# Patient Record
Sex: Male | Born: 1970 | Race: Black or African American | Hispanic: No | Smoking: Current some day smoker
Health system: Southern US, Community
[De-identification: ages and names within clinical notes are randomized; demographics above are authoritative.]

## PROBLEM LIST (undated history)

## (undated) ENCOUNTER — Ambulatory Visit (HOSPITAL_COMMUNITY): Source: Home / Self Care

## (undated) DIAGNOSIS — I509 Heart failure, unspecified: Secondary | ICD-10-CM

## (undated) DIAGNOSIS — N200 Calculus of kidney: Secondary | ICD-10-CM

## (undated) DIAGNOSIS — F32A Depression, unspecified: Secondary | ICD-10-CM

## (undated) DIAGNOSIS — I10 Essential (primary) hypertension: Secondary | ICD-10-CM

## (undated) DIAGNOSIS — R569 Unspecified convulsions: Secondary | ICD-10-CM

## (undated) DIAGNOSIS — R0609 Other forms of dyspnea: Secondary | ICD-10-CM

## (undated) DIAGNOSIS — T7840XA Allergy, unspecified, initial encounter: Secondary | ICD-10-CM

## (undated) DIAGNOSIS — E119 Type 2 diabetes mellitus without complications: Secondary | ICD-10-CM

## (undated) DIAGNOSIS — J449 Chronic obstructive pulmonary disease, unspecified: Secondary | ICD-10-CM

## (undated) DIAGNOSIS — F1910 Other psychoactive substance abuse, uncomplicated: Secondary | ICD-10-CM

## (undated) DIAGNOSIS — I38 Endocarditis, valve unspecified: Secondary | ICD-10-CM

## (undated) DIAGNOSIS — F419 Anxiety disorder, unspecified: Secondary | ICD-10-CM

## (undated) DIAGNOSIS — R0600 Dyspnea, unspecified: Secondary | ICD-10-CM

## (undated) DIAGNOSIS — H409 Unspecified glaucoma: Secondary | ICD-10-CM

## (undated) HISTORY — DX: Anxiety disorder, unspecified: F41.9

## (undated) HISTORY — DX: Allergy, unspecified, initial encounter: T78.40XA

## (undated) HISTORY — DX: Depression, unspecified: F32.A

## (undated) HISTORY — PX: NO PAST SURGERIES: SHX2092

## (undated) HISTORY — DX: Heart failure, unspecified: I50.9

## (undated) HISTORY — DX: Essential (primary) hypertension: I10

## (undated) HISTORY — PX: FRACTURE SURGERY: SHX138

## (undated) HISTORY — DX: Type 2 diabetes mellitus without complications: E11.9

## (undated) HISTORY — PX: KIDNEY STONE SURGERY: SHX686

---

## 1998-06-16 ENCOUNTER — Encounter: Payer: Self-pay | Admitting: Emergency Medicine

## 1998-06-16 ENCOUNTER — Emergency Department (HOSPITAL_COMMUNITY): Admission: EM | Admit: 1998-06-16 | Discharge: 1998-06-16 | Payer: Self-pay | Admitting: Emergency Medicine

## 2005-10-27 ENCOUNTER — Emergency Department (HOSPITAL_COMMUNITY): Admission: EM | Admit: 2005-10-27 | Discharge: 2005-10-27 | Payer: Self-pay | Admitting: Emergency Medicine

## 2007-02-26 ENCOUNTER — Emergency Department (HOSPITAL_COMMUNITY): Admission: EM | Admit: 2007-02-26 | Discharge: 2007-02-26 | Payer: Self-pay | Admitting: Emergency Medicine

## 2011-02-05 LAB — URINALYSIS, ROUTINE W REFLEX MICROSCOPIC
Glucose, UA: NEGATIVE
Ketones, ur: 15 — AB
Nitrite: NEGATIVE
Protein, ur: 30 — AB
Specific Gravity, Urine: 1.033 — ABNORMAL HIGH
Urobilinogen, UA: 1
pH: 5.5

## 2011-02-05 LAB — URINE MICROSCOPIC-ADD ON

## 2014-10-09 ENCOUNTER — Ambulatory Visit: Payer: Self-pay

## 2014-11-06 ENCOUNTER — Ambulatory Visit: Payer: Self-pay | Attending: Internal Medicine

## 2015-06-17 ENCOUNTER — Emergency Department (HOSPITAL_COMMUNITY): Payer: Self-pay

## 2015-06-17 ENCOUNTER — Encounter (HOSPITAL_COMMUNITY): Payer: Self-pay | Admitting: Emergency Medicine

## 2015-06-17 ENCOUNTER — Emergency Department (HOSPITAL_COMMUNITY)
Admission: EM | Admit: 2015-06-17 | Discharge: 2015-06-17 | Disposition: A | Discharge status: Home / Self Care | Payer: Self-pay | Attending: Emergency Medicine | Admitting: Emergency Medicine

## 2015-06-17 DIAGNOSIS — R05 Cough: Secondary | ICD-10-CM

## 2015-06-17 DIAGNOSIS — Z880 Allergy status to penicillin: Secondary | ICD-10-CM | POA: Insufficient documentation

## 2015-06-17 DIAGNOSIS — F17200 Nicotine dependence, unspecified, uncomplicated: Secondary | ICD-10-CM | POA: Insufficient documentation

## 2015-06-17 DIAGNOSIS — B349 Viral infection, unspecified: Secondary | ICD-10-CM | POA: Insufficient documentation

## 2015-06-17 DIAGNOSIS — R059 Cough, unspecified: Secondary | ICD-10-CM

## 2015-06-17 DIAGNOSIS — M549 Dorsalgia, unspecified: Secondary | ICD-10-CM | POA: Insufficient documentation

## 2015-06-17 LAB — URINALYSIS, ROUTINE W REFLEX MICROSCOPIC
BILIRUBIN URINE: NEGATIVE
GLUCOSE, UA: NEGATIVE mg/dL
HGB URINE DIPSTICK: NEGATIVE
Ketones, ur: NEGATIVE mg/dL
Leukocytes, UA: NEGATIVE
Nitrite: NEGATIVE
PH: 5.5 (ref 5.0–8.0)
Protein, ur: 30 mg/dL — AB
SPECIFIC GRAVITY, URINE: 1.028 (ref 1.005–1.030)

## 2015-06-17 LAB — URINE MICROSCOPIC-ADD ON: BACTERIA UA: NONE SEEN

## 2015-06-17 MED ORDER — BENZONATATE 100 MG PO CAPS: 100.0000 mg | ORAL_CAPSULE | Freq: Three times a day (TID) | ORAL | Status: DC

## 2015-06-17 NOTE — ED Notes (Signed)
Pt called again for triage. No answer.

## 2015-06-17 NOTE — ED Provider Notes (Signed)
CSN: [id number]     Arrival date & time 06/17/15  1122 History  By signing my name below, I, Arianna Nassar, attest that this documentation has been prepared under the direction and in the presence of [employer], PA-C. Electronically Signed: Octavia Heir, ED Scribe. 06/17/2015. 12:53 PM.    Chief Complaint  Patient presents with  . Cough  . Fever  . Back Pain    The history is provided by the patient. No language interpreter was used.   HPI Comments: Darius [name] is a 45 y.o. male who presents to the Emergency Department complaining of intermittent, gradual worsening flu-like symptoms onset 4 days ago. Pt has been having associated generalized body aches, headache, congestion, productive cough, weakness and back pain. He reports having symptoms similar to when he had pneumonia. Pt took two dayquil yesterday to help with his symptoms which gave him relief. He denies chest pain, sob, sore throat, sinus pressure, fever, abdominal pain, and diarrhea.   History reviewed. No pertinent past medical history. History reviewed. No pertinent past surgical history. No family history on file. Social History  Substance Use Topics  . Smoking status: Current Some Day Smoker  . Smokeless tobacco: None  . Alcohol Use: Yes    Review of Systems  Constitutional: Positive for chills. Negative for fever.  HENT: Positive for congestion. Negative for sinus pressure and sore throat.   Eyes: Negative for visual disturbance.  Respiratory: Positive for cough.   Cardiovascular: Negative.   Gastrointestinal: Negative for abdominal pain and diarrhea.  Genitourinary: Negative for dysuria.  Musculoskeletal: Positive for back pain.  Skin: Negative for rash.  Neurological: Positive for weakness and headaches.      Allergies  Penicillins  Home Medications   Prior to Admission medications   Medication Sig Start Date End Date Taking? Authorizing Provider  benzonatate (TESSALON) 100 MG capsule Take 1 capsule  (100 mg total) by mouth every 8 (eight) hours. 06/17/15   Chase Picket Ward, PA-C   Triage vitals: BP 116/75 mmHg  Pulse 81  Temp(Src) 98.9 F (37.2 C) (Oral)  Resp 18  Wt 177 lb (80.287 kg)  SpO2 100% Physical Exam  Constitutional: He is oriented to person, place, and time. He appears well-developed and well-nourished.  HENT:  Head: Normocephalic and atraumatic.  Mouth/Throat: Oropharynx is clear and moist. No oropharyngeal exudate.  Neck: Normal range of motion. Neck supple.  Cardiovascular: Normal rate, regular rhythm and normal heart sounds.   Pulmonary/Chest: Effort normal and breath sounds normal. No respiratory distress. He has no wheezes. He has no rales.  Abdominal: Soft. He exhibits no distension. There is no tenderness.  Musculoskeletal: Normal range of motion.  Lymphadenopathy:    He has no cervical adenopathy.  Neurological: He is alert and oriented to person, place, and time.  Skin: Skin is warm and dry. No rash noted.  Psychiatric: He has a normal mood and affect.  Nursing note and vitals reviewed.   ED Course  Procedures  DIAGNOSTIC STUDIES: Oxygen Saturation is 100% on RA, normal by my interpretation.  COORDINATION OF CARE:  12:50 PM Discussed treatment plan which includes chest x-ray and urinalysis with pt at bedside and pt agreed to plan.  Labs Review Labs Reviewed  URINALYSIS, ROUTINE W REFLEX MICROSCOPIC (NOT AT [facility]) - Abnormal; Notable for the following:    Color, Urine AMBER (*)    Protein, ur 30 (*)    All other components within normal limits  URINE MICROSCOPIC-ADD ON - Abnormal; Notable for the  following:    Squamous Epithelial / LPF 0-5 (*)    All other components within normal limits    Imaging Review Dg Chest 2 View  06/17/2015  CLINICAL DATA:  Cough EXAM: CHEST  2 VIEW COMPARISON:  10/27/2005 chest radiograph. FINDINGS: Stable cardiomediastinal silhouette with normal heart size. No pneumothorax. No pleural effusion. Lungs appear clear,  with no acute consolidative airspace disease and no pulmonary edema. IMPRESSION: No active cardiopulmonary disease. Electronically Signed   By: Delbert Phenix M.D.   On: 06/17/2015 13:34   I have personally reviewed and evaluated these images and lab results as part of my medical decision-making.   EKG Interpretation None      MDM   Final diagnoses:  Cough  Viral syndrome   Darius [name] is afebrile, non-toxic appearing with a clear lung exam. CXR with no acute cardiopulmonary disease. Mild rhinorrhea and OP with erythema, no exudates. Likely viral URI. Patient was also complaining of low back pain, therefore UA was obtained which is reassuring. Patient is agreeable to symptomatic treatment with close follow up with PCP as needed but spoke at length about emergent, changing, or worsening of symptoms that should prompt return to ER. Patient voices understanding and is agreeable to plan.   Blood pressure 105/68, pulse 76, temperature 99.1 F (37.3 C), temperature source Oral, resp. rate 18, weight 80.287 kg, SpO2 98 %.  I personally performed the services described in this documentation, which was scribed in my presence. The recorded information has been reviewed and is accurate.  [facility] Ward, PA-C 06/17/15 1605  Arby Barrette, MD 06/18/15 [phone]

## 2015-06-17 NOTE — Discharge Instructions (Signed)
1. Medications: mucinex as needed for congestion, tessalon as needed for cough, continue usual home medications 2. Treatment: rest, drink plenty of fluids, take tylenol or ibuprofen for fever control as needed 3. Follow Up: Please followup with your primary doctor in 3 days for discussion of your diagnoses and further evaluation after today's visit; if you do not have a primary care doctor use the resource guide provided to find one; Return to the ER for high fevers, difficulty breathing or other concerning symptoms   Emergency Department Resource Guide  1) Find a Doctor and Pay Out of Pocket Although you won't have to find out who is covered by your insurance plan, it is a good idea to ask around and get recommendations. You will then need to call the office and see if the doctor you have chosen will accept you as a new patient and what types of options they offer for patients who are self-pay. Some doctors offer discounts or will set up payment plans for their patients who do not have insurance, but you will need to ask so you aren't surprised when you get to your appointment.  2) Contact Your Local Health Department Not all health departments have doctors that can see patients for sick visits, but many do, so it is worth a call to see if yours does. If you don't know where your local health department is, you can check in your phone book. The CDC also has a tool to help you locate your state's health department, and many state websites also have listings of all of their local health departments.  3) Find a Walk-in Clinic If your illness is not likely to be very severe or complicated, you may want to try a walk in clinic. These are popping up all over the country in pharmacies, drugstores, and shopping centers. They're usually staffed by nurse practitioners or physician assistants that have been trained to treat common illnesses and complaints. They're usually fairly quick and inexpensive. However,  if you have serious medical issues or chronic medical problems, these are probably not your best option.  No Primary Care Doctor: - Call [facility] at  [phone]: they can help you locate a primary care doctor that  accepts your insurance, provides certain services, etc. - Physician Referral Service: [phone]  Chronic Pain Problems: Organization         Address  Phone   Notes  [facility]  [phone] Patients need to be referred by their primary care doctor.   Medication Assistance: Organization         Address  Phone   Notes  [facility] Medication [facility] [address] [place] [place], [place] [address] [phone] --Must be a resident of [facility] -- Must have NO insurance coverage whatsoever (no Medicaid/ Medicare, etc.) -- The pt. MUST have a primary care doctor that directs their care regularly and follows them in the community   [facility]  [phone]   [employer]  [phone]    Agencies that provide inexpensive medical care: Organization         Address  Phone   Notes  [facility]  [phone]   [facility]    [phone]   [facility] [address] [place], [place] [address] [phone]   [facility] [address] [place] [phone]   [facility]    [phone]  [facility]    [phone]   [facility]  [address], [place] Phone:  [phone], Fax:  [phone] Hours of Operation:  9 am - 6 pm, M-F.  Also accepts Medicaid/Medicare and self-pay.  [facility]  [address], [place] Phone: [phone], Fax: [phone]. Hours of Operation:  8:30 am - 5:30 pm, M-F.  Also accepts Medicaid and self-pay.  [facility] [place] [address], [place] Phone: [phone]   [facility] [address] Natasha Bence [place], [place] [phone], Ext. [phone] Mondays & Thursdays: 7-9 AM.  First 15 patients are seen on a first come, first serve basis.    Medicaid-accepting [facility] Providers:  Organization         Address  Phone   Notes  [facility] [address], [place] [phone] Also accepts self-pay patients.  [facility] [address] Laurell Josephs [place], [place]  [phone]   [facility] [address], [place] [phone]   [facility] [address] [place] [phone]   Renaye Rakers [address], [place]   [phone] Only accepts [place] patients after they have their name applied to their card.   Self-Pay (no insurance) in [facility]:  Organization         Address  Phone   Notes  Sickle Cell Patients, [facility] [address] [place], [place] [phone]   [facility] [address] [place], [place] [phone]   [facility] [place]  [address], [place] [phone]   [facility]/Dr. Osei-Bonsu  [address] [place] or [address], [place] [phone] Phone number for both [place] and [place] locations is the same.  [facility] [address], [place] [phone]   [facility] [address] [place] or [address] [phone]   [facility] [address] [place] [phone], phone; [phone], fax Sees patients 1st and 3rd Saturday of every month.  Must not qualify for public or private insurance (i.e. Medicaid, Medicare, [place] Health Choice, Veterans' Benefits)  Household income should be no more than 200% of the poverty level The clinic cannot treat you if you are pregnant or think you are  pregnant  Sexually transmitted diseases are not treated at the clinic.

## 2015-06-17 NOTE — ED Notes (Signed)
Called for triage x1.

## 2015-06-17 NOTE — ED Notes (Signed)
Called for triage x2 no response. 

## 2015-06-17 NOTE — ED Notes (Signed)
Pt started 4-5 days ago with fever, bodyaches, cough, nasal congestion, back pain.

## 2015-10-03 ENCOUNTER — Emergency Department (HOSPITAL_COMMUNITY)
Admission: EM | Admit: 2015-10-03 | Discharge: 2015-10-03 | Disposition: A | Discharge status: Home / Self Care | Payer: Self-pay | Attending: Emergency Medicine | Admitting: Emergency Medicine

## 2015-10-03 ENCOUNTER — Encounter (HOSPITAL_COMMUNITY): Payer: Self-pay | Admitting: Emergency Medicine

## 2015-10-03 ENCOUNTER — Emergency Department (HOSPITAL_COMMUNITY): Payer: Self-pay

## 2015-10-03 DIAGNOSIS — N189 Chronic kidney disease, unspecified: Secondary | ICD-10-CM | POA: Insufficient documentation

## 2015-10-03 DIAGNOSIS — N289 Disorder of kidney and ureter, unspecified: Secondary | ICD-10-CM

## 2015-10-03 DIAGNOSIS — R109 Unspecified abdominal pain: Secondary | ICD-10-CM

## 2015-10-03 DIAGNOSIS — R3129 Other microscopic hematuria: Secondary | ICD-10-CM

## 2015-10-03 DIAGNOSIS — N200 Calculus of kidney: Secondary | ICD-10-CM

## 2015-10-03 DIAGNOSIS — F17200 Nicotine dependence, unspecified, uncomplicated: Secondary | ICD-10-CM | POA: Insufficient documentation

## 2015-10-03 DIAGNOSIS — D72819 Decreased white blood cell count, unspecified: Secondary | ICD-10-CM

## 2015-10-03 LAB — COMPREHENSIVE METABOLIC PANEL
ALK PHOS: 55 U/L (ref 38–126)
ALT: 25 U/L (ref 17–63)
ANION GAP: 7 (ref 5–15)
AST: 31 U/L (ref 15–41)
Albumin: 3.5 g/dL (ref 3.5–5.0)
BILIRUBIN TOTAL: 0.7 mg/dL (ref 0.3–1.2)
BUN: 15 mg/dL (ref 6–20)
CALCIUM: 9.3 mg/dL (ref 8.9–10.3)
CO2: 25 mmol/L (ref 22–32)
CREATININE: 1.28 mg/dL — AB (ref 0.61–1.24)
Chloride: 108 mmol/L (ref 101–111)
Glucose, Bld: 102 mg/dL — ABNORMAL HIGH (ref 65–99)
Potassium: 3.5 mmol/L (ref 3.5–5.1)
Sodium: 140 mmol/L (ref 135–145)
TOTAL PROTEIN: 6 g/dL — AB (ref 6.5–8.1)

## 2015-10-03 LAB — URINALYSIS, ROUTINE W REFLEX MICROSCOPIC
BILIRUBIN URINE: NEGATIVE
Glucose, UA: NEGATIVE mg/dL
KETONES UR: 15 mg/dL — AB
Leukocytes, UA: NEGATIVE
NITRITE: NEGATIVE
PH: 6 (ref 5.0–8.0)
PROTEIN: 30 mg/dL — AB
Specific Gravity, Urine: 1.027 (ref 1.005–1.030)

## 2015-10-03 LAB — URINE MICROSCOPIC-ADD ON

## 2015-10-03 LAB — CBC
HEMATOCRIT: 39.2 % (ref 39.0–52.0)
HEMOGLOBIN: 13.1 g/dL (ref 13.0–17.0)
MCH: 30.7 pg (ref 26.0–34.0)
MCHC: 33.4 g/dL (ref 30.0–36.0)
MCV: 91.8 fL (ref 78.0–100.0)
Platelets: 165 10*3/uL (ref 150–400)
RBC: 4.27 MIL/uL (ref 4.22–5.81)
RDW: 12.4 % (ref 11.5–15.5)
WBC: 3.1 10*3/uL — ABNORMAL LOW (ref 4.0–10.5)

## 2015-10-03 MED ORDER — OXYCODONE-ACETAMINOPHEN 5-325 MG PO TABS: 1.0000 | ORAL_TABLET | ORAL | Status: DC | PRN

## 2015-10-03 NOTE — Discharge Instructions (Signed)
Take ibuprofen or naproxen as needed for less severe pain. Return to the emergency department if pain is not being controlled, or if you start running a fever.  Kidney Stones Kidney stones (urolithiasis) are deposits that form inside your kidneys. The intense pain is caused by the stone moving through the urinary tract. When the stone moves, the ureter goes into spasm around the stone. The stone is usually passed in the urine.  CAUSES   A disorder that makes certain neck glands produce too much parathyroid hormone (primary hyperparathyroidism).  A buildup of uric acid crystals, similar to gout in your joints.  Narrowing (stricture) of the ureter.  A kidney obstruction present at birth (congenital obstruction).  Previous surgery on the kidney or ureters.  Numerous kidney infections. SYMPTOMS   Feeling sick to your stomach (nauseous).  Throwing up (vomiting).  Blood in the urine (hematuria).  Pain that usually spreads (radiates) to the groin.  Frequency or urgency of urination. DIAGNOSIS   Taking a history and physical exam.  Blood or urine tests.  CT scan.  Occasionally, an examination of the inside of the urinary bladder (cystoscopy) is performed. TREATMENT   Observation.  Increasing your fluid intake.  Extracorporeal shock wave lithotripsy--This is a noninvasive procedure that uses shock waves to break up kidney stones.  Surgery may be needed if you have severe pain or persistent obstruction. There are various surgical procedures. Most of the procedures are performed with the use of small instruments. Only small incisions are needed to accommodate these instruments, so recovery time is minimized. The size, location, and chemical composition are all important variables that will determine the proper choice of action for you. Talk to your health care provider to better understand your situation so that you will minimize the risk of injury to yourself and your kidney.    HOME CARE INSTRUCTIONS   Drink enough water and fluids to keep your urine clear or pale yellow. This will help you to pass the stone or stone fragments.  Strain all urine through the provided strainer. Keep all particulate matter and stones for your health care provider to see. The stone causing the pain may be as small as a grain of salt. It is very important to use the strainer each and every time you pass your urine. The collection of your stone will allow your health care provider to analyze it and verify that a stone has actually passed. The stone analysis will often identify what you can do to reduce the incidence of recurrences.  Only take over-the-counter or prescription medicines for pain, discomfort, or fever as directed by your health care provider.  Keep all follow-up visits as told by your health care provider. This is important.  Get follow-up X-rays if required. The absence of pain does not always mean that the stone has passed. It may have only stopped moving. If the urine remains completely obstructed, it can cause loss of kidney function or even complete destruction of the kidney. It is your responsibility to make sure X-rays and follow-ups are completed. Ultrasounds of the kidney can show blockages and the status of the kidney. Ultrasounds are not associated with any radiation and can be performed easily in a matter of minutes.  Make changes to your daily diet as told by your health care provider. You may be told to:  Limit the amount of salt that you eat.  Eat 5 or more servings of fruits and vegetables each day.  Limit the amount of  meat, poultry, fish, and eggs that you eat.  Collect a 24-hour urine sample as told by your health care provider.You may need to collect another urine sample every 6-12 months. SEEK MEDICAL CARE IF:  You experience pain that is progressive and unresponsive to any pain medicine you have been prescribed. SEEK IMMEDIATE MEDICAL CARE IF:    Pain cannot be controlled with the prescribed medicine.  You have a fever or shaking chills.  The severity or intensity of pain increases over 18 hours and is not relieved by pain medicine.  You develop a new onset of abdominal pain.  You feel faint or pass out.  You are unable to urinate.   This information is not intended to replace advice given to you by your health care provider. Make sure you discuss any questions you have with your health care provider.   Document Released: 04/14/2005 Document Revised: 01/03/2015 Document Reviewed: 09/15/2012 Elsevier Interactive Patient Education 2016 Elsevier Inc.  Acetaminophen; Oxycodone tablets What is this medicine? ACETAMINOPHEN; OXYCODONE (a set a MEE noe fen; ox i KOE done) is a pain reliever. It is used to treat moderate to severe pain. This medicine may be used for other purposes; ask your health care provider or pharmacist if you have questions. What should I tell my health care provider before I take this medicine? They need to know if you have any of these conditions: -brain tumor -Crohn's disease, inflammatory bowel disease, or ulcerative colitis -drug abuse or addiction -head injury -heart or circulation problems -if you often drink alcohol -kidney disease or problems going to the bathroom -liver disease -lung disease, asthma, or breathing problems -an unusual or allergic reaction to acetaminophen, oxycodone, other opioid analgesics, other medicines, foods, dyes, or preservatives -pregnant or trying to get pregnant -breast-feeding How should I use this medicine? Take this medicine by mouth with a full glass of water. Follow the directions on the prescription label. You can take it with or without food. If it upsets your stomach, take it with food. Take your medicine at regular intervals. Do not take it more often than directed. Talk to your pediatrician regarding the use of this medicine in children. Special care may be  needed. Patients over 73 years old may have a stronger reaction and need a smaller dose. Overdosage: If you think you have taken too much of this medicine contact a poison control center or emergency room at once. NOTE: This medicine is only for you. Do not share this medicine with others. What if I miss a dose? If you miss a dose, take it as soon as you can. If it is almost time for your next dose, take only that dose. Do not take double or extra doses. What may interact with this medicine? -alcohol -antihistamines -barbiturates like amobarbital, butalbital, butabarbital, methohexital, pentobarbital, phenobarbital, thiopental, and secobarbital -benztropine -drugs for bladder problems like solifenacin, trospium, oxybutynin, tolterodine, hyoscyamine, and methscopolamine -drugs for breathing problems like ipratropium and tiotropium -drugs for certain stomach or intestine problems like propantheline, homatropine methylbromide, glycopyrrolate, atropine, belladonna, and dicyclomine -general anesthetics like etomidate, ketamine, nitrous oxide, propofol, desflurane, enflurane, halothane, isoflurane, and sevoflurane -medicines for depression, anxiety, or psychotic disturbances -medicines for sleep -muscle relaxants -naltrexone -narcotic medicines (opiates) for pain -phenothiazines like perphenazine, thioridazine, chlorpromazine, mesoridazine, fluphenazine, prochlorperazine, promazine, and trifluoperazine -scopolamine -tramadol -trihexyphenidyl This list may not describe all possible interactions. Give your health care provider a list of all the medicines, herbs, non-prescription drugs, or dietary supplements you use. Also tell them  if you smoke, drink alcohol, or use illegal drugs. Some items may interact with your medicine. What should I watch for while using this medicine? Tell your doctor or health care professional if your pain does not go away, if it gets worse, or if you have new or a  different type of pain. You may develop tolerance to the medicine. Tolerance means that you will need a higher dose of the medication for pain relief. Tolerance is normal and is expected if you take this medicine for a long time. Do not suddenly stop taking your medicine because you may develop a severe reaction. Your body becomes used to the medicine. This does NOT mean you are addicted. Addiction is a behavior related to getting and using a drug for a non-medical reason. If you have pain, you have a medical reason to take pain medicine. Your doctor will tell you how much medicine to take. If your doctor wants you to stop the medicine, the dose will be slowly lowered over time to avoid any side effects. You may get drowsy or dizzy. Do not drive, use machinery, or do anything that needs mental alertness until you know how this medicine affects you. Do not stand or sit up quickly, especially if you are an older patient. This reduces the risk of dizzy or fainting spells. Alcohol may interfere with the effect of this medicine. Avoid alcoholic drinks. There are different types of narcotic medicines (opiates) for pain. If you take more than one type at the same time, you may have more side effects. Give your health care provider a list of all medicines you use. Your doctor will tell you how much medicine to take. Do not take more medicine than directed. Call emergency for help if you have problems breathing. The medicine will cause constipation. Try to have a bowel movement at least every 2 to 3 days. If you do not have a bowel movement for 3 days, call your doctor or health care professional. Do not take Tylenol (acetaminophen) or medicines that have acetaminophen with this medicine. Too much acetaminophen can be very dangerous. Many nonprescription medicines contain acetaminophen. Always read the labels carefully to avoid taking more acetaminophen. What side effects may I notice from receiving this  medicine? Side effects that you should report to your doctor or health care professional as soon as possible: -allergic reactions like skin rash, itching or hives, swelling of the face, lips, or tongue -breathing difficulties, wheezing -confusion -light headedness or fainting spells -severe stomach pain -unusually weak or tired -yellowing of the skin or the whites of the eyes Side effects that usually do not require medical attention (report to your doctor or health care professional if they continue or are bothersome): -dizziness -drowsiness -nausea -vomiting This list may not describe all possible side effects. Call your doctor for medical advice about side effects. You may report side effects to FDA at 1-800-FDA-1088. Where should I keep my medicine? Keep out of the reach of children. This medicine can be abused. Keep your medicine in a safe place to protect it from theft. Do not share this medicine with anyone. Selling or giving away this medicine is dangerous and against the law. This medicine may cause accidental overdose and death if it taken by other adults, children, or pets. Mix any unused medicine with a substance like cat litter or coffee grounds. Then throw the medicine away in a sealed container like a sealed bag or a coffee can with a lid. Do  not use the medicine after the expiration date. Store at room temperature between 20 and 25 degrees C (68 and 77 degrees F). NOTE: This sheet is a summary. It may not cover all possible information. If you have questions about this medicine, talk to your doctor, pharmacist, or health care provider.    2016, Elsevier/Gold Standard. (2014-03-15 15:18:46)  [employer] is a great source of information about community services available.  Access by dialing 2-1-1 from anywhere in [place], or by website -  PooledIncome.pl.   Other Local Resources (Updated 04/2015)  Financial  Assistance   Services    Phone Number and Address  [facility]  Low-cost medical care - 1st and 3rd Saturday of every month  Must not qualify for public or private insurance and must have limited income [phone] [address] [place], [place]    [employer] of Social Services  Child care  Emergency assistance for housing and [employer]  Medicaid [phone] [address] [place], [place] [address]   [facility]  Low-cost medical care for children, communicable diseases, sexually-transmitted diseases, immunizations, maternity care, womens health and family planning [phone] [address] [place], [place] [address]  [facility]   Medication assistance for [facility] residents  Must meet income requirements [phone] [address] [place], [place].    [facility] Social Services  Child care  Emergency assistance for housing and [employer]  Medicaid [phone] [address] [place], [place] [address]  [facility]   Low-cost medical care,   Monday through Friday, 9 am to 6 pm.   Accepts Medicare/Medicaid, and self-pay [phone] [address] [place], [place] [address]  [facility]  Low-cost medical care - Monday through Friday, 8:30 am - 5:30 pm  Accepts Medicaid and self-pay [phone] [address] [place], [place] [address]   [facility]  Primary medical care, including for those with sickle cell disease  Accepts Medicare, Medicaid, insurance and self-pay [phone] [address] [place], [place]  [facility]   Primary medical care  Accepts Medicare, [place], insurance and self-pay [phone] [address] [place], [place] [address]   [facility]  Department of Social Services  Child care  Emergency assistance for housing and [employer]  Medicaid [phone] [address] [place], [place] [address]  [facility] Department of Health and [employer]  Child care  Emergency assistance for housing and [employer]  Medicaid [phone] [address] [place], [place] [address]   [facility] Medication Assistance Program  Medication assistance for [facility] residents with no insurance only  Must have a primary care doctor [phone] [address] [place], [place]  [facility]   Primary medical care  [place], [place], insurance  [phone] [address] [place], [place]  [facility]   Medication assistance [phone]  [facility]   Primary medical care  Accepts Medicare, [place], insurance and self-pay [phone] [address] [place], [place] [address]  [facility]   Primary medical care  Accepts Medicare, [place], insurance and self-pay [phone] [address] [place], [place] [address]  [facility]  For [place] residents between the ages of 58 and 79 who do not  have any form of health insurance, Medicare, Medicaid, or VA benefits.  Services are provided free of charge to uninsured patients who fall within federal poverty guidelines.    Hours: Tuesdays and Thursdays, 4:15 - 8 pm [phone] [address] [place], [place] [address]  [facility]     Primary medical care  Dental care  Nutritional counseling  Pharmacy  Accepts Medicaid, Medicare, most insurance.  Fees are adjusted based on ability to pay.   [phone] [facility] [address] [place], [place]  [phone] [facility] [address] [place], [place]  [phone] [facility] [place], [place]  [phone] [facility], [address] [place], [place]  [phone] [facility] [address] [place], [place]  [facility]  Womens health and family planning [phone] [place], [place]  [facility] Department of Social Services  Child care  Emergency assistance for housing and [employer]  Medicaid [phone] [address] [place], [place] [address]   [facility]    Ages 1 and older  Hours: Mondays and Thursdays, 7:00 am - 9:00 am Patients are seen on a first come, first served basis. [phone], [phone] [place], [place]  [facility] Division of Social Services  Child care  Emergency assistance for housing and [employer]  Medicaid [id number] [address] [place], [place] [address]  [facility]  Medication assistance  Rental assistance  Food pantry  Medication assistance  Housing assistance  Emergency food distribution  Utility assistance [phone] [address] [place], [place]  [phone]  [address] [place], [place] [address] Hours: Tuesdays and Thursdays from 9am - 12 noon by appointment only  [phone] [address] [place], [place] [address]  [facility] - Lanae Boast   Accepts private insurance, [place], and [place].  Payment is based on a sliding scale for those without insurance.  Hours: Mondays, Tuesdays and Thursdays, 8:30 am - 5:30 pm.   [phone] [address] [place], [place]  [facility] - Family Medicine at [facility], [place], and [place].  Payment is based on a sliding scale for those without insurance. [phone] [address] [place], [place]  [facility] - Pediatrics at E. Scientist, research (physical sciences),  [employer], and [place].  Payment is based on a sliding scale for those without insurance [phone] [address], [employer], [place]  [facility] - Pediatrics at [employer], [place], and [place].  Payment is based on a sliding scale for those without insurance. [phone] [address] [place], [place]  [facility] - Pediatrics at [facility], [place], and [place].  Payment is based on a sliding scale for those without insurance. [phone], ext. [phone] [place], [place].    [facility]  Maternity care.  Accepts Medicaid and self-pay. [phone] [address] [place], [place]

## 2015-10-03 NOTE — ED Notes (Signed)
Pt reports noting dark urine for the last several days. Pt also c/o back pain. Denies recent fever. Pt ambulatory. Nad at present. Vss.

## 2015-10-03 NOTE — ED Notes (Signed)
Pt stepped out of room and stated that he was going to smoke. Advised pt that if he left he would be leaving AMA and would be taken out of the system and would have to start the process all over again. Pt agreed to stay and went back into room.

## 2015-10-03 NOTE — ED Notes (Signed)
No answer for vital sign recheck. 

## 2015-10-03 NOTE — ED Provider Notes (Signed)
CSN: [id number]     Arrival date & time 10/03/15  1335 History   First MD Initiated Contact with Patient 10/03/15 1723     Chief Complaint  Patient presents with  . Dysuria  . Back Pain     (Consider location/radiation/quality/duration/timing/severity/associated sxs/prior Treatment) The history is provided by the patient.  45 year old male comes in with about 2 week history of pain in the right flank with associated dark urine and urinary urgency area pain was severe last night to the point where it was "the worst pain ever had". Pain is somewhat improved today and he rates it at 6/10. He denies fever or chills. There has been no nausea or vomiting. Nothing makes the pain better nothing makes it worse. He states pain is somewhat similar to when he had no episode of hepatitis A. He has not taken any medication for his pain.  History reviewed. No pertinent past medical history. History reviewed. No pertinent past surgical history. History reviewed. No pertinent family history. Social History  Substance Use Topics  . Smoking status: Current Some Day Smoker  . Smokeless tobacco: None  . Alcohol Use: Yes     Comment: 32oz beer daily    Review of Systems  All other systems reviewed and are negative.     Allergies  Penicillins  Home Medications   Prior to Admission medications   Medication Sig Start Date End Date Taking? Authorizing Provider  benzonatate (TESSALON) 100 MG capsule Take 1 capsule (100 mg total) by mouth every 8 (eight) hours. 06/17/15   Jaime Pilcher Ward, PA-C   BP 115/69 mmHg  Pulse 64  Temp(Src) 98.3 F (36.8 C) (Oral)  Resp 18  Ht 6' 1.5" (1.867 m)  Wt 161 lb 1 oz (73.057 kg)  BMI 20.96 kg/m2  SpO2 97% Physical Exam  Nursing note and vitals reviewed.  45 year old male, resting comfortably and in no acute distress. Vital signs are normal. Oxygen saturation is 97%, which is normal. Head is normocephalic and atraumatic. PERRLA, EOMI. Oropharynx is  clear. Neck is nontender and supple without adenopathy or JVD. Back is nontender and there is no CVA tenderness. Lungs are clear without rales, wheezes, or rhonchi. Chest is nontender. Heart has regular rate and rhythm without murmur. Abdomen is soft, flat, with moderate tenderness over the right lateral abdomen. There are no masses or hepatosplenomegaly and peristalsis is normoactive. Extremities have no cyanosis or edema, full range of motion is present. Skin is warm and dry without rash. Neurologic: Mental status is normal, cranial nerves are intact, there are no motor or sensory deficits.   ED Course  Procedures (including critical care time) Labs Review Results for orders placed or performed during the hospital encounter of 10/03/15  CBC  Result Value Ref Range   WBC 3.1 (L) 4.0 - 10.5 K/uL   RBC 4.27 4.22 - 5.81 MIL/uL   Hemoglobin 13.1 13.0 - 17.0 g/dL   HCT 40.9 81.1 - 91.4 %   MCV 91.8 78.0 - 100.0 fL   MCH 30.7 26.0 - 34.0 pg   MCHC 33.4 30.0 - 36.0 g/dL   RDW 78.2 95.6 - 21.3 %   Platelets 165 150 - 400 K/uL  Comprehensive metabolic panel  Result Value Ref Range   Sodium 140 135 - 145 mmol/L   Potassium 3.5 3.5 - 5.1 mmol/L   Chloride 108 101 - 111 mmol/L   CO2 25 22 - 32 mmol/L   Glucose, Bld 102 (H) 65 - 99  mg/dL   BUN 15 6 - 20 mg/dL   Creatinine, Ser 9.38 (H) 0.61 - 1.24 mg/dL   Calcium 9.3 8.9 - 18.2 mg/dL   Total Protein 6.0 (L) 6.5 - 8.1 g/dL   Albumin 3.5 3.5 - 5.0 g/dL   AST 31 15 - 41 U/L   ALT 25 17 - 63 U/L   Alkaline Phosphatase 55 38 - 126 U/L   Total Bilirubin 0.7 0.3 - 1.2 mg/dL   GFR calc non Af Amer >60 >60 mL/min   GFR calc Af Amer >60 >60 mL/min   Anion gap 7 5 - 15  Urinalysis, Routine w reflex microscopic (not at [facility])  Result Value Ref Range   Color, Urine YELLOW YELLOW   APPearance CLEAR CLEAR   Specific Gravity, Urine 1.027 1.005 - 1.030   pH 6.0 5.0 - 8.0   Glucose, UA NEGATIVE NEGATIVE mg/dL   Hgb urine dipstick LARGE (A)  NEGATIVE   Bilirubin Urine NEGATIVE NEGATIVE   Ketones, ur 15 (A) NEGATIVE mg/dL   Protein, ur 30 (A) NEGATIVE mg/dL   Nitrite NEGATIVE NEGATIVE   Leukocytes, UA NEGATIVE NEGATIVE  Urine microscopic-add on  Result Value Ref Range   Squamous Epithelial / LPF 0-5 (A) NONE SEEN   WBC, UA 0-5 0 - 5 WBC/hpf   RBC / HPF TOO NUMEROUS TO COUNT 0 - 5 RBC/hpf   Bacteria, UA FEW (A) NONE SEEN   Crystals CA OXALATE CRYSTALS (A) NEGATIVE   Urine-Other MUCOUS PRESENT    Imaging Review Ct Renal Stone Study  10/03/2015  CLINICAL DATA:  45 year old male with right flank pain, nausea vomiting. EXAM: CT ABDOMEN AND PELVIS WITHOUT CONTRAST TECHNIQUE: Multidetector CT imaging of the abdomen and pelvis was performed following the standard protocol without IV contrast. COMPARISON:  None. FINDINGS: Evaluation of this exam is limited in the absence of intravenous contrast. The visualized lung bases are clear. No intra-abdominal free air or free fluid. There is mild apparent diffuse stranding of the mesentery. The liver, gallbladder, pancreas, spleen, and adrenal glands appear unremarkable. There multiple right renal calculi measuring to 9 mm in the interpolar aspect of the right kidney. There is a 3 mm nonobstructing left renal interpolar calculus. There is no hydronephrosis on either side. The visualized ureters and urinary bladder appear unremarkable. The prostate and seminal vesicles are grossly unremarkable. Evaluation of the bowel is limited in the absence of oral contrast. There is moderate stool throughout the colon. No evidence of bowel obstruction. There is mild apparent prominence of the small bowel folds. Evaluation however is very limited on this noncontrast study. Correlation with clinical exam is recommended to exclude enteritis. Normal appendix. The abdominal aorta and IVC appear grossly unremarkable on this noncontrast study. No portal venous gas identified. There is no adenopathy. The abdominal wall soft  tissues appear unremarkable. The osseous structures are intact. IMPRESSION: Nonobstructing bilateral renal calculi measuring up to 9 mm in the interpolar aspect of the right kidney. No hydronephrosis. Thickened appearance of the small bowel folds. Correlation with clinical exam is recommended to exclude enteritis. No bowel obstruction. Electronically Signed   By: Elgie Collard M.D.   On: 10/03/2015 18:41   I have personally reviewed and evaluated these images and lab results as part of my medical decision-making.   Final diagnoses:  Right flank pain  Nephrolithiasis  Microscopic hematuria  Leukopenia  Renal insufficiency    Pain in the right side of the abdomen which I believe is actually the equivalent  of flank pain for this patient. Urinalysis shows too numerous to count WBCs concerning for urolithiasis. Old records are  reviewed and he has no relevant past visits and no prior abdominal imaging. He is being sent for CT renal stone protocol.  CT shows several renal calculi but no evidence of hydronephrosis. I suspect that his pain last night was from a ureteral calculus which has passed. Remainder of labs show mild renal insufficiency and mild leukopenia, both felt not to be clinically significant. He is advised to take ibuprofen or naproxen as needed for pain and is given prescription for oxycodone-acetaminophen to use as needed for more severe pain. Return precautions given. Patient expresses understanding.  Dione Booze, MD 10/03/15 2000

## 2015-10-04 LAB — URINE CULTURE: Culture: <10000 — AB

## 2016-07-19 ENCOUNTER — Emergency Department
Admission: EM | Admit: 2016-07-19 | Discharge: 2016-07-19 | Disposition: A | Discharge status: Home / Self Care | Payer: Self-pay | Attending: Emergency Medicine | Admitting: Emergency Medicine

## 2016-07-19 ENCOUNTER — Encounter: Payer: Self-pay | Admitting: Emergency Medicine

## 2016-07-19 DIAGNOSIS — E119 Type 2 diabetes mellitus without complications: Secondary | ICD-10-CM | POA: Insufficient documentation

## 2016-07-19 DIAGNOSIS — E118 Type 2 diabetes mellitus with unspecified complications: Secondary | ICD-10-CM

## 2016-07-19 DIAGNOSIS — F17200 Nicotine dependence, unspecified, uncomplicated: Secondary | ICD-10-CM | POA: Insufficient documentation

## 2016-07-19 DIAGNOSIS — F1910 Other psychoactive substance abuse, uncomplicated: Secondary | ICD-10-CM | POA: Insufficient documentation

## 2016-07-19 DIAGNOSIS — Z5181 Encounter for therapeutic drug level monitoring: Secondary | ICD-10-CM | POA: Insufficient documentation

## 2016-07-19 LAB — URINALYSIS, COMPLETE (UACMP) WITH MICROSCOPIC
BACTERIA UA: NONE SEEN
Bilirubin Urine: NEGATIVE
Glucose, UA: NEGATIVE mg/dL
KETONES UR: NEGATIVE mg/dL
Leukocytes, UA: NEGATIVE
Nitrite: NEGATIVE
PROTEIN: 30 mg/dL — AB
Specific Gravity, Urine: 1.025 (ref 1.005–1.030)
pH: 5 (ref 5.0–8.0)

## 2016-07-19 LAB — CBC
HEMATOCRIT: 40.2 % (ref 40.0–52.0)
HEMOGLOBIN: 13.8 g/dL (ref 13.0–18.0)
MCH: 32.4 pg (ref 26.0–34.0)
MCHC: 34.4 g/dL (ref 32.0–36.0)
MCV: 94 fL (ref 80.0–100.0)
Platelets: 217 10*3/uL (ref 150–440)
RBC: 4.28 MIL/uL — ABNORMAL LOW (ref 4.40–5.90)
RDW: 12.7 % (ref 11.5–14.5)
WBC: 3.6 10*3/uL — ABNORMAL LOW (ref 3.8–10.6)

## 2016-07-19 LAB — BASIC METABOLIC PANEL
Anion gap: 7 (ref 5–15)
BUN: 15 mg/dL (ref 6–20)
CALCIUM: 9.2 mg/dL (ref 8.9–10.3)
CO2: 28 mmol/L (ref 22–32)
Chloride: 106 mmol/L (ref 101–111)
Creatinine, Ser: 1.2 mg/dL (ref 0.61–1.24)
GFR calc Af Amer: >60 mL/min (ref >60)
GFR calc non Af Amer: >60 mL/min (ref >60)
GLUCOSE: 159 mg/dL — AB (ref 65–99)
POTASSIUM: 3.6 mmol/L (ref 3.5–5.1)
Sodium: 141 mmol/L (ref 135–145)

## 2016-07-19 LAB — URINE DRUG SCREEN, QUALITATIVE (ARMC ONLY)
Amphetamines, Ur Screen: NOT DETECTED
BARBITURATES, UR SCREEN: NOT DETECTED
BENZODIAZEPINE, UR SCRN: NOT DETECTED
CANNABINOID 50 NG, UR ~~LOC~~: POSITIVE — AB
COCAINE METABOLITE, UR ~~LOC~~: POSITIVE — AB
MDMA (Ecstasy)Ur Screen: NOT DETECTED
METHADONE SCREEN, URINE: NOT DETECTED
Opiate, Ur Screen: NOT DETECTED
Phencyclidine (PCP) Ur S: NOT DETECTED
TRICYCLIC, UR SCREEN: NOT DETECTED

## 2016-07-19 LAB — TROPONIN I

## 2016-07-19 LAB — GLUCOSE, CAPILLARY: GLUCOSE-CAPILLARY: 161 mg/dL — AB (ref 65–99)

## 2016-07-19 MED ORDER — METFORMIN HCL 500 MG PO TABS: 500.0000 mg | ORAL_TABLET | Freq: Two times a day (BID) | ORAL | 11 refills | Status: DC

## 2016-07-19 NOTE — ED Triage Notes (Addendum)
Patient to ER for c/o possible "blood sugar problems and eye problems". States he "went blind" a couple of days ago, then regained vision. Has not checked blood sugar in over a month d/t not having insurance and starting a new job.  Patient asking about wifi network in hospital on his phone at stat desk.

## 2016-07-19 NOTE — ED Notes (Signed)
Patient states his vision "comes and goes" and that he "really just needs a blood sugar check and a note for work"  Patient states 'I dont need no HIV test man.Darius KitchenMarland [name]"

## 2016-07-19 NOTE — ED Provider Notes (Addendum)
[facility] Provider Note  ____________________________________________   I have reviewed the triage vital signs and the nursing notes.   HISTORY  Chief Complaint Blood Sugar Problem    HPI Darius [name] is a 46 y.o. male who is here for a work note and a glucose check. Patient states that he was diagnosed as "borderline" diabetic some years ago and has not followed up. He lost a lot of weight. States a few nights ago he had a banana ice cream milkshake with Butterfinger crumbles and then had 4 pieces of pizza also drank some beer and smoked some marijuana. The next morning he stood up quickly and felt lightheaded and felt like he might pass out for a few seconds. He then immediately returned to baseline. This was his "loss of vision" that he described. That was several days ago and he spoke fine ever since. He states that he does have a history of a traumatic injury to the left eye which is non-reactive. He does not have any eye pain. He is supposed to wear glasses but does not do so. Patient is in a very big hurry to go home. He states he just needs a note for his job saying he was here. The patient states that he has had no fever no chills no abdominal pain no vomiting. He states that he has not actually passed out. He denies any other neurologic complaints. He states that he does not want any further testing besides what I order discussed with him which would include an EKG and the blood work that we already have.      History reviewed. No pertinent past medical history.  There are no active problems to display for this patient.   History reviewed. No pertinent surgical history.  Prior to Admission medications   Medication Sig Start Date End Date Taking? Authorizing Provider  oxyCODONE-acetaminophen (PERCOCET) 5-325 MG tablet Take 1 tablet by mouth every 4 (four) hours as needed for moderate pain. 10/03/15   Dione Booze, MD     Allergies Penicillins  History reviewed. No pertinent family history.  Social History Social History  Substance Use Topics  . Smoking status: Current Some Day Smoker  . Smokeless tobacco: Not on file  . Alcohol use Yes     Comment: 32oz beer daily    Review of Systems Constitutional: No fever/chills Eyes: No visual changes. ENT: No sore throat. No stiff neck no neck pain Cardiovascular: Denies chest pain. Respiratory: Denies shortness of breath. Gastrointestinal:   no vomiting.  No diarrhea.  No constipation. Genitourinary: Negative for dysuria. Musculoskeletal: Negative lower extremity swelling Skin: Negative for rash. Neurological: Negative for severe headaches, focal weakness or numbness. 10-point ROS otherwise negative.  ____________________________________________   PHYSICAL EXAM:  VITAL SIGNS: ED Triage Vitals  Enc Vitals Group     BP 07/19/16 1033 111/71     Pulse Rate 07/19/16 1033 64     Resp 07/19/16 1033 16     Temp 07/19/16 1033 97.6 F (36.4 C)     Temp Source 07/19/16 1033 Oral     SpO2 07/19/16 1033 98 %     Weight 07/19/16 1035 170 lb (77.1 kg)     Height 07/19/16 1035 6\' 1"  (1.854 m)     Head Circumference --      Peak Flow --      Pain Score 07/19/16 1035 0     Pain Loc --      Pain Edu? --  Excl. in GC? --     Constitutional: Alert and oriented. Well appearing and in no acute distress. Eyes: Conjunctivae are normal. The pupil is baseline nonreactive right pupil is reactive to light. Neither are red or inflamed. Head: Atraumatic. Nose: No congestion/rhinnorhea. Mouth/Throat: Mucous membranes are moist.  Oropharynx non-erythematous. Neck: No stridor.   Nontender with no meningismus Cardiovascular: Normal rate, regular rhythm. Grossly normal heart sounds.  Good peripheral circulation. Respiratory: Normal respiratory effort.  No retractions. Lungs CTAB. Abdominal: Soft and nontender. No distention. No guarding no rebound Back:   There is no focal tenderness or step off.  there is no midline tenderness there are no lesions noted. there is no CVA tenderness Musculoskeletal: No lower extremity tenderness, no upper extremity tenderness. No joint effusions, no DVT signs strong distal pulses no edema Neurologic:  Normal speech and language. No gross focal neurologic deficits are appreciated.  Skin:  Skin is warm, dry and intact. No rash noted. Psychiatric: Mood and affect are normal. Speech and behavior are normal.  ____________________________________________   LABS (all labs ordered are listed, but only abnormal results are displayed)  Labs Reviewed  BASIC METABOLIC PANEL - Abnormal; Notable for the following:       Result Value   Glucose, Bld 159 (*)    All other components within normal limits  CBC - Abnormal; Notable for the following:    WBC 3.6 (*)    RBC 4.28 (*)    All other components within normal limits  GLUCOSE, CAPILLARY - Abnormal; Notable for the following:    Glucose-Capillary 161 (*)    All other components within normal limits  URINALYSIS, COMPLETE (UACMP) WITH MICROSCOPIC  TROPONIN I  URINE DRUG SCREEN, QUALITATIVE ([facility] ONLY)  CBG MONITORING, ED   ____________________________________________  EKG  I personally interpreted any EKGs ordered by me or triage S rhythm at 60 bpm no acute ST elevation or depression on severe ST changes normal axis LVH noted ____________________________________________  RADIOLOGY  I reviewed any imaging ordered by me or triage that were performed during my shift and, if possible, patient and/or family made aware of any abnormal findings. ____________________________________________   PROCEDURES  Procedure(s) performed: None  Procedures  Critical Care performed: None  ____________________________________________   INITIAL IMPRESSION / ASSESSMENT AND PLAN / ED COURSE  Pertinent labs & imaging results that were available during my care of the  patient were reviewed by me and considered in my medical decision making (see chart for details). Patient eager to go home. I have offered to check his urine and he agrees. We have offered visual acuity. Patient refuses HIV test. Patient states that he "went blind couple days ago" however really what happened was he still quickly after a significant amount of indulgence. Substances and he felt lightheaded. No chest pain or shortness of breath. We will do a screening EKG if he permits it. Patient is very much limiting our workup here. He is not seen an eye doctor in several years. He has no visual complaints at this time, and his left eye is baseline dilated from prior trauma. He is to follow closely up with an eye doctor but he states he has not seen one in 2 years. If work appears reassuring, I will suggest perhaps starting metformin for his elevated sugars, and have him follow-up with a I doctor as well as with a primary care doctor. Patient refuses any imaging at this time. No evidence of emergent condition at this moment noted thus far  ____________________________________________   FINAL CLINICAL IMPRESSION(S) / ED DIAGNOSES  Final diagnoses:  None      This chart was dictated using voice recognition software.  Despite best efforts to proofread,  errors can occur which can change meaning.      Jeanmarie Plant, MD 07/19/16 1325    Jeanmarie Plant, MD 07/19/16 1330

## 2016-07-19 NOTE — Discharge Instructions (Signed)
Take the medications as prescribed follow up without fail with the eye doctor as well as primary care doctor listed above. It is likely that you have diabetes we'll start her on a short course of metformin to see if this makes it feel better. Do not use cocaine or marijuana or drink alcohol to excess. If you have any new or worrisome symptoms including change in vision, chest pain or shortness of breath nausea vomiting headache weakness etc. return to the emergency department.

## 2017-02-02 ENCOUNTER — Emergency Department (HOSPITAL_COMMUNITY)
Admission: EM | Admit: 2017-02-02 | Discharge: 2017-02-02 | Disposition: A | Discharge status: Home / Self Care | Payer: Self-pay | Attending: Emergency Medicine | Admitting: Emergency Medicine

## 2017-02-02 ENCOUNTER — Emergency Department (HOSPITAL_COMMUNITY): Payer: Self-pay

## 2017-02-02 ENCOUNTER — Encounter (HOSPITAL_COMMUNITY): Payer: Self-pay | Admitting: *Deleted

## 2017-02-02 DIAGNOSIS — Y939 Activity, unspecified: Secondary | ICD-10-CM | POA: Insufficient documentation

## 2017-02-02 DIAGNOSIS — Y929 Unspecified place or not applicable: Secondary | ICD-10-CM | POA: Insufficient documentation

## 2017-02-02 DIAGNOSIS — T07XXXA Unspecified multiple injuries, initial encounter: Secondary | ICD-10-CM | POA: Insufficient documentation

## 2017-02-02 DIAGNOSIS — Y999 Unspecified external cause status: Secondary | ICD-10-CM | POA: Insufficient documentation

## 2017-02-02 DIAGNOSIS — F17200 Nicotine dependence, unspecified, uncomplicated: Secondary | ICD-10-CM | POA: Insufficient documentation

## 2017-02-02 HISTORY — DX: Unspecified glaucoma: H40.9

## 2017-02-02 MED ORDER — TRAMADOL HCL 50 MG PO TABS: 50.0000 mg | ORAL_TABLET | Freq: Four times a day (QID) | ORAL | 0 refills | Status: DC | PRN

## 2017-02-02 MED ORDER — CYCLOBENZAPRINE HCL 10 MG PO TABS: 10.0000 mg | ORAL_TABLET | Freq: Three times a day (TID) | ORAL | 0 refills | Status: DC | PRN

## 2017-02-02 MED ORDER — OXYCODONE-ACETAMINOPHEN 5-325 MG PO TABS
2.0000 | ORAL_TABLET | Freq: Once | ORAL | Status: AC
  Administered 2017-02-02: 2 via ORAL
  Filled 2017-02-02: qty 2
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
## 2017-02-15 ENCOUNTER — Encounter: Payer: Self-pay | Admitting: Intensive Care

## 2017-02-15 ENCOUNTER — Emergency Department: Payer: Self-pay

## 2017-02-15 ENCOUNTER — Emergency Department
Admission: EM | Admit: 2017-02-15 | Discharge: 2017-02-15 | Disposition: A | Discharge status: Home / Self Care | Payer: Self-pay | Attending: Emergency Medicine | Admitting: Emergency Medicine

## 2017-02-15 DIAGNOSIS — F17210 Nicotine dependence, cigarettes, uncomplicated: Secondary | ICD-10-CM | POA: Insufficient documentation

## 2017-02-15 DIAGNOSIS — F1010 Alcohol abuse, uncomplicated: Secondary | ICD-10-CM | POA: Insufficient documentation

## 2017-02-15 DIAGNOSIS — R1011 Right upper quadrant pain: Secondary | ICD-10-CM | POA: Insufficient documentation

## 2017-02-15 DIAGNOSIS — J069 Acute upper respiratory infection, unspecified: Secondary | ICD-10-CM | POA: Insufficient documentation

## 2017-02-15 LAB — URINALYSIS, COMPLETE (UACMP) WITH MICROSCOPIC
BACTERIA UA: NONE SEEN
BILIRUBIN URINE: NEGATIVE
GLUCOSE, UA: NEGATIVE mg/dL
HGB URINE DIPSTICK: NEGATIVE
Ketones, ur: NEGATIVE mg/dL
Leukocytes, UA: NEGATIVE
NITRITE: NEGATIVE
Protein, ur: NEGATIVE mg/dL
RBC / HPF: NONE SEEN RBC/hpf (ref 0–5)
SPECIFIC GRAVITY, URINE: 1.005 (ref 1.005–1.030)
Squamous Epithelial / LPF: NONE SEEN
WBC UA: NONE SEEN WBC/hpf (ref 0–5)
pH: 7 (ref 5.0–8.0)

## 2017-02-15 LAB — INFLUENZA PANEL BY PCR (TYPE A & B)
Influenza A By PCR: NEGATIVE
Influenza B By PCR: NEGATIVE

## 2017-02-15 LAB — HEPATIC FUNCTION PANEL
ALK PHOS: 70 U/L (ref 38–126)
ALT: 24 U/L (ref 17–63)
AST: 36 U/L (ref 15–41)
Albumin: 3.8 g/dL (ref 3.5–5.0)
BILIRUBIN INDIRECT: 0.5 mg/dL (ref 0.3–0.9)
BILIRUBIN TOTAL: 0.7 mg/dL (ref 0.3–1.2)
Bilirubin, Direct: 0.2 mg/dL (ref 0.1–0.5)
Total Protein: 7.1 g/dL (ref 6.5–8.1)

## 2017-02-15 LAB — BASIC METABOLIC PANEL
ANION GAP: 10 (ref 5–15)
BUN: 18 mg/dL (ref 6–20)
CO2: 27 mmol/L (ref 22–32)
Calcium: 9.6 mg/dL (ref 8.9–10.3)
Chloride: 103 mmol/L (ref 101–111)
Creatinine, Ser: 1.15 mg/dL (ref 0.61–1.24)
GFR calc Af Amer: >60 mL/min (ref >60)
GLUCOSE: 92 mg/dL (ref 65–99)
POTASSIUM: 4.2 mmol/L (ref 3.5–5.1)
Sodium: 140 mmol/L (ref 135–145)

## 2017-02-15 LAB — CBC
HEMATOCRIT: 41.6 % (ref 40.0–52.0)
HEMOGLOBIN: 14.1 g/dL (ref 13.0–18.0)
MCH: 32 pg (ref 26.0–34.0)
MCHC: 33.8 g/dL (ref 32.0–36.0)
MCV: 94.6 fL (ref 80.0–100.0)
Platelets: 245 10*3/uL (ref 150–440)
RBC: 4.4 MIL/uL (ref 4.40–5.90)
RDW: 12.7 % (ref 11.5–14.5)
WBC: 4.7 10*3/uL (ref 3.8–10.6)

## 2017-02-15 LAB — TROPONIN I: Troponin I: <0.03 ng/mL (ref <0.03)

## 2017-02-15 MED ORDER — IPRATROPIUM-ALBUTEROL 0.5-2.5 (3) MG/3ML IN SOLN
3.0000 mL | Freq: Once | RESPIRATORY_TRACT | Status: DC
  Filled 2017-02-15 (×2): qty 3

## 2017-02-15 MED ORDER — ALBUTEROL SULFATE HFA 108 (90 BASE) MCG/ACT IN AERS: 2.0000 | INHALATION_SPRAY | Freq: Four times a day (QID) | RESPIRATORY_TRACT | 2 refills | Status: DC | PRN

## 2017-02-15 MED ORDER — IBUPROFEN 400 MG PO TABS
600.0000 mg | ORAL_TABLET | Freq: Once | ORAL | Status: DC
  Filled 2017-02-15: qty 2

## 2017-02-15 MED ORDER — SODIUM CHLORIDE 0.9 % IV BOLUS (SEPSIS): 1000.0000 mL | Freq: Once | INTRAVENOUS | Status: DC
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
## 2017-02-15 NOTE — ED Notes (Signed)
ED Provider at bedside. 

## 2017-02-15 NOTE — ED Notes (Signed)
Pt refused oral medications prior to discharge. Stated "I dont have time"

## 2017-02-15 NOTE — Discharge Instructions (Signed)
You may continue to use NyQuil and Mucinex for her symptoms. Please take albuterol as needed for coughing spasms.  Please make a follow-up appointment with a primary care physician. The 3 clinics listed do accept patients that do not have medical insurance.  Drink alcohol in moderation, or do not drink alcohol at all.  Return to the emergency department if you develop severe pain, inability to keep down fluids, shortness of breath, lightheadedness or fainting, fever, or any other symptoms concerning to you be

## 2017-02-15 NOTE — ED Triage Notes (Signed)
Patient presents with dizziness and R sided flank pain X2 weeks. Reports nausea and diarrhea. C/o blurred vision but not at this time. Ambulatory in triage with no distress noted

## 2017-12-18 ENCOUNTER — Encounter: Payer: Self-pay | Admitting: Emergency Medicine

## 2017-12-18 ENCOUNTER — Other Ambulatory Visit: Payer: Self-pay

## 2017-12-18 ENCOUNTER — Inpatient Hospital Stay
Admission: EM | Admit: 2017-12-18 | Discharge: 2017-12-19 | DRG: 292 | Disposition: A | Discharge status: Home / Self Care | Payer: Self-pay | Attending: Internal Medicine | Admitting: Internal Medicine

## 2017-12-18 ENCOUNTER — Emergency Department: Payer: Self-pay

## 2017-12-18 DIAGNOSIS — I5030 Unspecified diastolic (congestive) heart failure: Secondary | ICD-10-CM

## 2017-12-18 DIAGNOSIS — R05 Cough: Secondary | ICD-10-CM

## 2017-12-18 DIAGNOSIS — I5021 Acute systolic (congestive) heart failure: Secondary | ICD-10-CM

## 2017-12-18 DIAGNOSIS — I509 Heart failure, unspecified: Secondary | ICD-10-CM

## 2017-12-18 DIAGNOSIS — E119 Type 2 diabetes mellitus without complications: Secondary | ICD-10-CM | POA: Diagnosis present

## 2017-12-18 DIAGNOSIS — R0603 Acute respiratory distress: Secondary | ICD-10-CM | POA: Diagnosis present

## 2017-12-18 DIAGNOSIS — F17210 Nicotine dependence, cigarettes, uncomplicated: Secondary | ICD-10-CM | POA: Diagnosis present

## 2017-12-18 DIAGNOSIS — F1010 Alcohol abuse, uncomplicated: Secondary | ICD-10-CM | POA: Diagnosis present

## 2017-12-18 DIAGNOSIS — Z880 Allergy status to penicillin: Secondary | ICD-10-CM

## 2017-12-18 DIAGNOSIS — H409 Unspecified glaucoma: Secondary | ICD-10-CM | POA: Diagnosis present

## 2017-12-18 DIAGNOSIS — I5041 Acute combined systolic (congestive) and diastolic (congestive) heart failure: Principal | ICD-10-CM | POA: Diagnosis present

## 2017-12-18 DIAGNOSIS — I248 Other forms of acute ischemic heart disease: Secondary | ICD-10-CM | POA: Diagnosis present

## 2017-12-18 DIAGNOSIS — Q231 Congenital insufficiency of aortic valve: Secondary | ICD-10-CM

## 2017-12-18 DIAGNOSIS — R059 Cough, unspecified: Secondary | ICD-10-CM

## 2017-12-18 LAB — HEMOGLOBIN A1C
HEMOGLOBIN A1C: 4.3 % — AB (ref 4.8–5.6)
MEAN PLASMA GLUCOSE: 76.71 mg/dL

## 2017-12-18 LAB — CBC WITH DIFFERENTIAL/PLATELET
BASOS PCT: 1 %
Basophils Absolute: 0 10*3/uL (ref 0–0.1)
EOS ABS: 0.1 10*3/uL (ref 0–0.7)
Eosinophils Relative: 1 %
HCT: 44.7 % (ref 40.0–52.0)
HEMOGLOBIN: 15.5 g/dL (ref 13.0–18.0)
Lymphocytes Relative: 12 %
Lymphs Abs: 0.7 10*3/uL — ABNORMAL LOW (ref 1.0–3.6)
MCH: 32.9 pg (ref 26.0–34.0)
MCHC: 34.6 g/dL (ref 32.0–36.0)
MCV: 95 fL (ref 80.0–100.0)
Monocytes Absolute: 0.4 10*3/uL (ref 0.2–1.0)
Monocytes Relative: 7 %
NEUTROS PCT: 79 %
Neutro Abs: 4.8 10*3/uL (ref 1.4–6.5)
Platelets: 168 10*3/uL (ref 150–440)
RBC: 4.71 MIL/uL (ref 4.40–5.90)
RDW: 13.1 % (ref 11.5–14.5)
WBC: 6 10*3/uL (ref 3.8–10.6)

## 2017-12-18 LAB — TROPONIN I
TROPONIN I: 0.04 ng/mL — AB (ref <0.03)
Troponin I: 0.03 ng/mL (ref <0.03)
Troponin I: 0.03 ng/mL (ref <0.03)

## 2017-12-18 LAB — TSH: TSH: 0.758 u[IU]/mL (ref 0.350–4.500)

## 2017-12-18 LAB — BASIC METABOLIC PANEL
ANION GAP: 8 (ref 5–15)
BUN: 12 mg/dL (ref 6–20)
CHLORIDE: 105 mmol/L (ref 98–111)
CO2: 28 mmol/L (ref 22–32)
Calcium: 9.3 mg/dL (ref 8.9–10.3)
Creatinine, Ser: 1.17 mg/dL (ref 0.61–1.24)
GFR calc non Af Amer: >60 mL/min (ref >60)
Glucose, Bld: 87 mg/dL (ref 70–99)
POTASSIUM: 4.2 mmol/L (ref 3.5–5.1)
SODIUM: 141 mmol/L (ref 135–145)

## 2017-12-18 LAB — BRAIN NATRIURETIC PEPTIDE: B NATRIURETIC PEPTIDE 5: 842 pg/mL — AB (ref 0.0–100.0)

## 2017-12-18 MED ORDER — FUROSEMIDE 40 MG PO TABS
20.0000 mg | ORAL_TABLET | Freq: Once | ORAL | Status: AC
  Administered 2017-12-18: 20 mg via ORAL
  Filled 2017-12-18: qty 1

## 2017-12-18 MED ORDER — PREDNISONE 20 MG PO TABS
60.0000 mg | ORAL_TABLET | Freq: Once | ORAL | Status: AC
  Administered 2017-12-18: 60 mg via ORAL
  Filled 2017-12-18: qty 3

## 2017-12-18 MED ORDER — NITROGLYCERIN 0.4 MG SL SUBL: 0.4000 mg | SUBLINGUAL_TABLET | SUBLINGUAL | Status: DC | PRN

## 2017-12-18 MED ORDER — NICOTINE 14 MG/24HR TD PT24
14.0000 mg | MEDICATED_PATCH | Freq: Every day | TRANSDERMAL | Status: DC
  Filled 2017-12-18: qty 1

## 2017-12-18 MED ORDER — CARVEDILOL 3.125 MG PO TABS
3.1250 mg | ORAL_TABLET | Freq: Two times a day (BID) | ORAL | Status: DC
  Administered 2017-12-18 – 2017-12-19 (×2): 3.125 mg via ORAL
  Filled 2017-12-18 (×2): qty 1

## 2017-12-18 MED ORDER — MORPHINE SULFATE (PF) 2 MG/ML IV SOLN: 2.0000 mg | INTRAVENOUS | Status: DC | PRN

## 2017-12-18 MED ORDER — ONDANSETRON HCL 4 MG/2ML IJ SOLN: 4.0000 mg | Freq: Four times a day (QID) | INTRAMUSCULAR | Status: DC | PRN

## 2017-12-18 MED ORDER — SODIUM CHLORIDE 0.9% FLUSH
3.0000 mL | Freq: Two times a day (BID) | INTRAVENOUS | Status: DC
  Administered 2017-12-18 – 2017-12-19 (×2): 3 mL via INTRAVENOUS

## 2017-12-18 MED ORDER — ALPRAZOLAM 0.5 MG PO TABS
0.2500 mg | ORAL_TABLET | Freq: Two times a day (BID) | ORAL | Status: DC | PRN
  Administered 2017-12-18: 0.25 mg via ORAL
  Filled 2017-12-18: qty 1

## 2017-12-18 MED ORDER — LISINOPRIL 5 MG PO TABS
2.5000 mg | ORAL_TABLET | Freq: Every day | ORAL | Status: DC
  Administered 2017-12-18 – 2017-12-19 (×2): 2.5 mg via ORAL
  Filled 2017-12-18 (×2): qty 1

## 2017-12-18 MED ORDER — IPRATROPIUM-ALBUTEROL 0.5-2.5 (3) MG/3ML IN SOLN
3.0000 mL | Freq: Once | RESPIRATORY_TRACT | Status: AC
  Administered 2017-12-18: 3 mL via RESPIRATORY_TRACT
  Filled 2017-12-18: qty 3

## 2017-12-18 MED ORDER — ENOXAPARIN SODIUM 40 MG/0.4ML ~~LOC~~ SOLN
40.0000 mg | SUBCUTANEOUS | Status: DC
  Administered 2017-12-18: 40 mg via SUBCUTANEOUS
  Filled 2017-12-18: qty 0.4

## 2017-12-18 MED ORDER — ASPIRIN 81 MG PO CHEW
324.0000 mg | CHEWABLE_TABLET | Freq: Once | ORAL | Status: AC
  Administered 2017-12-18: 324 mg via ORAL
  Filled 2017-12-18: qty 4

## 2017-12-18 MED ORDER — IPRATROPIUM-ALBUTEROL 0.5-2.5 (3) MG/3ML IN SOLN: 3.0000 mL | RESPIRATORY_TRACT | Status: DC | PRN

## 2017-12-18 MED ORDER — ASPIRIN EC 81 MG PO TBEC
81.0000 mg | DELAYED_RELEASE_TABLET | Freq: Every day | ORAL | Status: DC
  Administered 2017-12-19: 81 mg via ORAL
  Filled 2017-12-18: qty 1

## 2017-12-18 MED ORDER — ACETAMINOPHEN 325 MG PO TABS: 650.0000 mg | ORAL_TABLET | ORAL | Status: DC | PRN

## 2017-12-18 MED ORDER — SODIUM CHLORIDE 0.9% FLUSH: 3.0000 mL | INTRAVENOUS | Status: DC | PRN

## 2017-12-18 MED ORDER — SODIUM CHLORIDE 0.9 % IV SOLN: 250.0000 mL | INTRAVENOUS | Status: DC | PRN

## 2017-12-18 MED ORDER — FUROSEMIDE 10 MG/ML IJ SOLN
20.0000 mg | Freq: Two times a day (BID) | INTRAMUSCULAR | Status: DC
  Administered 2017-12-18 – 2017-12-19 (×2): 20 mg via INTRAVENOUS
  Filled 2017-12-18 (×2): qty 2

## 2017-12-18 NOTE — ED Notes (Signed)
Admitting MD at bedside.

## 2017-12-18 NOTE — H&P (Signed)
[facility] [facility] [place] [facility]   PATIENT NAME: Darius [name]    MR#:  [id number]  DATE OF BIRTH:  1971-04-17  DATE OF ADMISSION:  12/18/2017  PRIMARY CARE PHYSICIAN: Patient, No Pcp Per   REQUESTING/REFERRING PHYSICIAN:   CHIEF COMPLAINT:   Chief Complaint  Patient presents with  . Cough  . Back Pain    HISTORY OF PRESENT ILLNESS: Darius [name]  is a 47 y.o. 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  PAST MEDICAL HISTORY:   Past Medical History:  Diagnosis Date  . Glaucoma     PAST SURGICAL HISTORY:  None  SOCIAL HISTORY:  Social History   Tobacco Use  . Smoking status: Current Every Day Smoker    Packs/day: 1.00    Types: Cigarettes  . Smokeless tobacco: Never Used  Substance Use Topics  . Alcohol use: Yes    Comment: 32oz beer daily    FAMILY HISTORY:  HTN  DRUG ALLERGIES:  Allergies  Allergen Reactions  . Penicillins Other (See Comments)    Patient states that mother is highly allergic. 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  Has patient had a PCN reaction causing immediate rash, facial/tongue/throat swelling, SOB or lightheadedness with hypotension: No Has patient had a PCN reaction causing severe rash  involving mucus membranes or skin necrosis: No Has patient had a PCN reaction that required hospitalization Yes Has patient had a PCN reaction occurring within the last 10 years: No If all of the above answers are "N    REVIEW OF SYSTEMS:   CONSTITUTIONAL: No fever, fatigue or weakness.  EYES: No blurred or double vision.  EARS, NOSE, AND THROAT: No tinnitus or ear pain.  RESPIRATORY: + cough, shortness of breath, wheezing, no hemoptysis.  CARDIOVASCULAR: + chest pain, orthopnea, edema.  GASTROINTESTINAL: No nausea, vomiting, diarrhea or abdominal pain.  GENITOURINARY: No dysuria, hematuria.  ENDOCRINE: No polyuria, nocturia,  HEMATOLOGY: No anemia, easy bruising or bleeding SKIN: No rash or lesion. MUSCULOSKELETAL: No joint pain or arthritis.   NEUROLOGIC: No tingling, numbness, weakness.  PSYCHIATRY: No anxiety or depression.   MEDICATIONS AT HOME:  Prior to Admission medications   Medication Sig Start Date End Date Taking? Authorizing Provider  metFORMIN (GLUCOPHAGE) 500 MG tablet Take 1 tablet (500 mg total) by mouth 2 (two) times daily with a meal. Patient not taking: Reported on 02/02/2017 07/19/16 07/19/17  Jeanmarie Plant, MD      PHYSICAL EXAMINATION:   VITAL SIGNS: Blood pressure 140/80, pulse 78, temperature 98.2 F (36.8 C), temperature source Oral, resp. rate 18, height 6\' 2"  (1.88 m), weight 81.6 kg, SpO2 100 %.  GENERAL:  47 y.o.-year-old patient lying in the bed with no acute distress.  Frail-appearing EYES: Pupils equal, round, reactive to light and accommodation. No scleral icterus. Extraocular muscles  intact.  HEENT: Head atraumatic, normocephalic. Oropharynx and nasopharynx clear.  NECK:  Supple, no jugular venous distention. No thyroid enlargement, no tenderness.  LUNGS: Rales up to mid back bilaterally with diminished breath sounds. No use of accessory muscles of respiration.  CARDIOVASCULAR: S1, S2 normal. No murmurs, rubs, or gallops.  ABDOMEN: Soft,  nontender, nondistended. Bowel sounds present. No organomegaly or mass.  EXTREMITIES: No pedal edema, cyanosis, or clubbing.  NEUROLOGIC: Cranial nerves II through XII are intact. MAES. Gait not checked.  PSYCHIATRIC: The patient is alert and oriented x 3.  SKIN: No obvious rash, lesion, or ulcer.   LABORATORY PANEL:   CBC Recent Labs  Lab 12/18/17 1301  WBC 6.0  HGB 15.5  HCT 44.7  PLT 168  MCV 95.0  MCH 32.9  MCHC 34.6  RDW 13.1  LYMPHSABS 0.7*  MONOABS 0.4  EOSABS 0.1  BASOSABS 0.0   ------------------------------------------------------------------------------------------------------------------  Chemistries  Recent Labs  Lab 12/18/17 1301  NA 141  K 4.2  CL 105  CO2 28  GLUCOSE 87  BUN 12  CREATININE 1.17  CALCIUM 9.3   ------------------------------------------------------------------------------------------------------------------ estimated creatinine clearance is 88.1 mL/min (by C-G formula based on SCr of 1.17 mg/dL). ------------------------------------------------------------------------------------------------------------------ No results for input(s): TSH, T4TOTAL, T3FREE, THYROIDAB in the last 72 hours.  Invalid input(s): FREET3   Coagulation profile No results for input(s): INR, PROTIME in the last 168 hours. ------------------------------------------------------------------------------------------------------------------- No results for input(s): DDIMER in the last 72 hours. -------------------------------------------------------------------------------------------------------------------  Cardiac Enzymes Recent Labs  Lab 12/18/17 1301  TROPONINI 0.04*   ------------------------------------------------------------------------------------------------------------------ Invalid input(s): POCBNP  ---------------------------------------------------------------------------------------------------------------  Urinalysis    Component Value  Date/Time   COLORURINE COLORLESS (A) 02/15/2017 1511   APPEARANCEUR CLEAR (A) 02/15/2017 1511   LABSPEC 1.005 02/15/2017 1511   PHURINE 7.0 02/15/2017 1511   GLUCOSEU NEGATIVE 02/15/2017 1511   HGBUR NEGATIVE 02/15/2017 1511   BILIRUBINUR NEGATIVE 02/15/2017 1511   KETONESUR NEGATIVE 02/15/2017 1511   PROTEINUR NEGATIVE 02/15/2017 1511   UROBILINOGEN 1.0 02/26/2007 1019   NITRITE NEGATIVE 02/15/2017 1511   LEUKOCYTESUR NEGATIVE 02/15/2017 1511     RADIOLOGY: Dg Chest 2 View  Result Date: 12/18/2017 CLINICAL DATA:  Cough. EXAM: CHEST - 2 VIEW COMPARISON:  02/15/2017. FINDINGS: Mediastinum and hilar structures normal. Cardiomegaly. Mild diffuse bilateral from interstitial prominence with Kerley B-lines. Mild CHF could present in this fashion. Other interstitial processes including pneumonitis cannot be excluded. No prominent pleural effusion. No pneumothorax IMPRESSION: Mild cardiomegaly. Mild bilateral pulmonary interstitial prominence with Kerley B-lines. Findings suggest mild CHF. Electronically Signed   By: Maisie Fus  Register   On: 12/18/2017 12:35    EKG: Orders placed or performed during the hospital encounter of 12/18/17  . ED EKG  . ED EKG  . EKG 12-Lead  . EKG 12-Lead    IMPRESSION AND PLAN: *Acute newly diagnosed congestive heart failure exacerbation  *Subacute intermittent chest pain most likely secondary to above  *Acute elevated troponins most likely secondary to heart failure *Chronic tobacco smoking abuse/dependency *History of diabetes  Admit to regular nursing for bed on our congestive heart failure protocol, cardiology to see, IV Lasix twice daily, Coreg, lisinopril, check echocardiogram, strict I&O monitoring, daily weights, heart healthy/low-sodium diet with fluid restriction, supplemental oxygen wean as tolerated, tobacco cessation counseling ordered, nicotine patch, continue to cycle cardiac enzymes given troponin elevation-most likely secondary to congestive  heart failure/demand ischemia, nitroglycerin as needed, IV morphine PRN breakthrough pain, check hemoglobin A1c, congestive heart failure education while in house, and continue close medical monitoring  All the records are reviewed and case discussed with ED provider. Management plans discussed with the patient, family and they are in agreement.  CODE STATUS:full    TOTAL TIME TAKING CARE OF THIS PATIENT: 45 minutes.    Evelena Asa Priya Matsen M.D on 12/18/2017   Between 7am to 6pm - Pager - [phone]  After 6pm go to [email or web address] - password [email or web address]  [facility]  Office  [phone]  CC: Primary care physician; Patient, No Pcp Per   Note: This dictation was prepared with Dragon dictation along with smaller phrase technology. Any transcriptional errors that result from this process are unintentional.

## 2017-12-18 NOTE — ED Triage Notes (Signed)
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

## 2017-12-18 NOTE — Progress Notes (Signed)
Family Meeting Note  Advance Directive:yes  Today a meeting took place with the Patient.  Patient is able to participate   The following clinical team members were present during this meeting:MD  The following were discussed:Patient's diagnosis: Congestive heart failure, elevated troponins, tobacco smoking, diabetes, Patient's progosis: Unable to determine and Goals for treatment: Full Code  Additional follow-up to be provided: prn  Time spent during discussion:20 minutes  Bertrum Sol, MD

## 2017-12-18 NOTE — ED Provider Notes (Signed)
[facility] Emergency Department Provider Note       Time seen: ----------------------------------------- 12:17 PM on 12/18/2017 -----------------------------------------   I have reviewed the triage vital signs and the nursing notes.  HISTORY   Chief Complaint Cough and Back Pain    HPI Darius [name] is a 47 y.o. male with a history of glaucoma who presents to the ED for difficulty breathing and shortness of breath.  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  He has had some back spasms and coughing as well.  He denies fevers, chills or other complaints.  Past Medical History:  Diagnosis Date  . Glaucoma     There are no active problems to display for this patient.   No past surgical history on file.  Allergies Penicillins  Social History Social History   Tobacco Use  . Smoking status: Current Every Day Smoker    Packs/day: 1.00    Types: Cigarettes  . Smokeless tobacco: Never Used  Substance Use Topics  . Alcohol use: Yes    Comment: 32oz beer daily  . Drug use: Yes    Types: Marijuana   Review of Systems Constitutional: Negative for fever. Cardiovascular: Negative for chest pain. Respiratory: Positive for shortness of breath Gastrointestinal: Negative for abdominal pain, vomiting and diarrhea. Musculoskeletal: Negative for back pain. Skin: Negative for rash. Neurological: Negative for headaches, focal weakness or numbness.  All systems negative/normal/unremarkable except as stated in the HPI  ____________________________________________   PHYSICAL EXAM:  VITAL SIGNS: ED Triage Vitals  Enc Vitals Group     BP 12/18/17 1201 (!) 147/79     Pulse Rate 12/18/17 1201 79     Resp 12/18/17 1201 18     Temp 12/18/17 1201 98.2 F (36.8 C)     Temp Source 12/18/17 1201 Oral     SpO2 12/18/17 1201 100 %     Weight 12/18/17  1202 180 lb (81.6 kg)     Height 12/18/17 1202 6\' 2"  (1.88 m)     Head Circumference --      Peak Flow --      Pain Score 12/18/17 1202 6     Pain Loc --      Pain Edu? --      Excl. in GC? --    Constitutional: Alert and oriented. Well appearing and in no distress. Eyes: Conjunctivae are normal. Normal extraocular movements. ENT   Head: Normocephalic and atraumatic.   Nose: No congestion/rhinnorhea.   Mouth/Throat: Mucous membranes are moist.   Neck: No stridor. Cardiovascular: Normal rate, regular rhythm. No murmurs, rubs, or gallops. Respiratory: Normal respiratory effort without tachypnea nor retractions. Breath sounds are clear and equal bilaterally. No wheezes/rales/rhonchi. Gastrointestinal: Soft and nontender. Normal bowel sounds Musculoskeletal: Nontender with normal range of motion in extremities. No lower extremity tenderness nor edema. Neurologic:  Normal speech and language. No gross focal neurologic deficits are appreciated.  Skin:  Skin is warm, dry and intact. No rash noted. Psychiatric: Mood and affect are normal. Speech and behavior are normal.  ____________________________________________  ED COURSE:  As part of my medical decision making, I reviewed the following data within the electronic MEDICAL RECORD NUMBER History obtained from family if available, nursing notes, old chart and ekg, as well as notes from prior ED visits. Patient presented for cough and shortness of breath, we will assess with labs  and imaging as indicated at this time.   Procedures   Labs Reviewed  CBC WITH DIFFERENTIAL/PLATELET - Abnormal; Notable for the following components:      Result Value   Lymphs Abs 0.7 (*)    All other components within normal limits  BRAIN NATRIURETIC PEPTIDE - Abnormal; Notable for the following components:   B Natriuretic Peptide 842.0 (*)    All other components within normal limits  TROPONIN I - Abnormal; Notable for the following components:    Troponin I 0.04 (*)    All other components within normal limits  BASIC METABOLIC PANEL    ____________________________________________   EKG: Interpreted by me, sinus rhythm the rate of 78 bpm, LVH, normal axis, normal QT  RADIOLOGY Images were viewed by me  Chest x-ray  IMPRESSION: Mild cardiomegaly. Mild bilateral pulmonary interstitial prominence with Kerley B-lines. Findings suggest mild CHF. ____________________________________________  DIFFERENTIAL DIAGNOSIS   Bronchospasm, URI, pneumonia, inhalation injury  FINAL ASSESSMENT AND PLAN  Cough, new onset congestive heart failure, elevated troponin   Plan: The patient had presented for persistent cough and shortness of breath. Patient's labs do indicate CHF with an elevated BNP and elevated troponin. Patient's imaging was concerning for cardiomegaly and mild edema.  Have started him on oral Lasix and aspirin.  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  I will discuss with the hospitalist for admission.   Ulice Dash, MD   Note: This note was generated in part or whole with voice recognition software. Voice recognition is usually quite accurate but there are transcription errors that can and very often do occur. I apologize for any typographical errors that were not detected and corrected.     Emily Filbert, MD 12/18/17 [phone]

## 2017-12-18 NOTE — ED Notes (Addendum)
See triage note  States he developed some SOB over the past few days  Has been working with sanding equipment  Also has been working with dry wall  Having some discomfort in back  Afebrile on arrival  Occasional cough

## 2017-12-18 NOTE — ED Notes (Signed)
Dr Mayford Knife at bedside with family to discuss lab results

## 2017-12-18 NOTE — ED Triage Notes (Signed)
Pt in via EMS with c/o difficulty breathing. Pt reports to EMS that he has been doing some sanding outside with no mask. Pt also reports right side back pain and states he may have a stone. 122/77, 98%, 87HR

## 2017-12-19 ENCOUNTER — Inpatient Hospital Stay (HOSPITAL_COMMUNITY)
Admit: 2017-12-19 | Discharge: 2017-12-19 | Disposition: A | Discharge status: Home / Self Care | Payer: Self-pay | Attending: Cardiovascular Disease | Admitting: Cardiovascular Disease

## 2017-12-19 DIAGNOSIS — F17200 Nicotine dependence, unspecified, uncomplicated: Secondary | ICD-10-CM

## 2017-12-19 DIAGNOSIS — J810 Acute pulmonary edema: Secondary | ICD-10-CM

## 2017-12-19 DIAGNOSIS — Q231 Congenital insufficiency of aortic valve: Secondary | ICD-10-CM

## 2017-12-19 DIAGNOSIS — I351 Nonrheumatic aortic (valve) insufficiency: Secondary | ICD-10-CM

## 2017-12-19 DIAGNOSIS — I340 Nonrheumatic mitral (valve) insufficiency: Secondary | ICD-10-CM

## 2017-12-19 DIAGNOSIS — F1010 Alcohol abuse, uncomplicated: Secondary | ICD-10-CM

## 2017-12-19 DIAGNOSIS — I517 Cardiomegaly: Secondary | ICD-10-CM

## 2017-12-19 DIAGNOSIS — I5031 Acute diastolic (congestive) heart failure: Secondary | ICD-10-CM

## 2017-12-19 DIAGNOSIS — I428 Other cardiomyopathies: Secondary | ICD-10-CM

## 2017-12-19 HISTORY — DX: Other cardiomyopathies: I42.8

## 2017-12-19 HISTORY — DX: Cardiomegaly: I51.7

## 2017-12-19 LAB — BASIC METABOLIC PANEL
ANION GAP: 6 (ref 5–15)
BUN: 24 mg/dL — ABNORMAL HIGH (ref 6–20)
CO2: 28 mmol/L (ref 22–32)
Calcium: 9 mg/dL (ref 8.9–10.3)
Chloride: 105 mmol/L (ref 98–111)
Creatinine, Ser: 1.08 mg/dL (ref 0.61–1.24)
Glucose, Bld: 118 mg/dL — ABNORMAL HIGH (ref 70–99)
Potassium: 3.7 mmol/L (ref 3.5–5.1)
Sodium: 139 mmol/L (ref 135–145)

## 2017-12-19 LAB — HIV ANTIBODY (ROUTINE TESTING W REFLEX): HIV SCREEN 4TH GENERATION: NONREACTIVE

## 2017-12-19 LAB — TROPONIN I: Troponin I: <0.03 ng/mL (ref <0.03)

## 2017-12-19 LAB — ECHOCARDIOGRAM COMPLETE
HEIGHTINCHES: 71 in
WEIGHTICAEL: 2411.2 [oz_av]

## 2017-12-19 MED ORDER — LISINOPRIL 2.5 MG PO TABS: 2.5000 mg | ORAL_TABLET | Freq: Every day | ORAL | 0 refills | Status: DC

## 2017-12-19 MED ORDER — FUROSEMIDE 20 MG PO TABS: 20.0000 mg | ORAL_TABLET | Freq: Every day | ORAL | 11 refills | Status: DC

## 2017-12-19 MED ORDER — METFORMIN HCL 500 MG PO TABS: 500.0000 mg | ORAL_TABLET | Freq: Two times a day (BID) | ORAL | 0 refills | Status: DC

## 2017-12-19 MED ORDER — CARVEDILOL 3.125 MG PO TABS: 3.1250 mg | ORAL_TABLET | Freq: Two times a day (BID) | ORAL | 0 refills | Status: DC

## 2017-12-19 NOTE — Progress Notes (Signed)
[employer] - [place] [facility]   PATIENT NAME: Darius [name]    MR#:  [id number]  DATE OF BIRTH:  01/19/1971  SUBJECTIVE:  CHIEF COMPLAINT:   Chief Complaint  Patient presents with  . Cough  . Back Pain  Patient seen and evaluated today Has decreased shortness of breath No chest pain No wheezing  REVIEW OF SYSTEMS:    ROS  CONSTITUTIONAL: No documented fever. No fatigue, weakness. No weight gain, no weight loss.  EYES: No blurry or double vision.  ENT: No tinnitus. No postnasal drip. No redness of the oropharynx.  RESPIRATORY: Decreased cough, no wheeze, no hemoptysis.  Decreased dyspnea.  CARDIOVASCULAR: No chest pain. No orthopnea. No palpitations. No syncope.  GASTROINTESTINAL: No nausea, no vomiting or diarrhea. No abdominal pain. No melena or hematochezia.  GENITOURINARY: No dysuria or hematuria.  ENDOCRINE: No polyuria or nocturia. No heat or cold intolerance.  HEMATOLOGY: No anemia. No bruising. No bleeding.  INTEGUMENTARY: No rashes. No lesions.  MUSCULOSKELETAL: No arthritis. No swelling. No gout.  NEUROLOGIC: No numbness, tingling, or ataxia. No seizure-type activity.  PSYCHIATRIC: No anxiety. No insomnia. No ADD.   DRUG ALLERGIES:   Allergies  Allergen Reactions  . Penicillins Other (See Comments)    Patient states that mother is highly allergic. 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  Has patient had a PCN reaction causing immediate rash, facial/tongue/throat swelling, SOB or lightheadedness with hypotension: No Has patient had a PCN reaction causing severe rash involving mucus membranes or skin necrosis: No Has patient had a PCN reaction that required hospitalization Yes Has patient had a PCN reaction occurring within the last 10 years: No If all of the above answers are "N    VITALS:  Blood pressure 117/63, pulse 73, temperature 97.9 F (36.6 C), temperature source Oral, resp. rate 18, height 5\' 11"  (1.803 m),  weight 68.4 kg, SpO2 100 %.  PHYSICAL EXAMINATION:   Physical Exam  GENERAL:  47 y.o.-year-old patient lying in the bed with no acute distress.  EYES: Pupils equal, round, reactive to light and accommodation. No scleral icterus. Extraocular muscles intact.  HEENT: Head atraumatic, normocephalic. Oropharynx and nasopharynx clear.  NECK:  Supple, no jugular venous distention. No thyroid enlargement, no tenderness.  LUNGS: Improved breath sounds bilaterally, Decreased crepitations. No use of accessory muscles of respiration.  CARDIOVASCULAR: S1, S2 normal. No murmurs, rubs, or gallops.  ABDOMEN: Soft, nontender, nondistended. Bowel sounds present. No organomegaly or mass.  EXTREMITIES: No cyanosis, clubbing or edema b/l.    NEUROLOGIC: Cranial nerves II through XII are intact. No focal Motor or sensory deficits b/l.   PSYCHIATRIC: The patient is alert and oriented x 3.  SKIN: No obvious rash, lesion, or ulcer.   LABORATORY PANEL:   CBC Recent Labs  Lab 12/18/17 1301  WBC 6.0  HGB 15.5  HCT 44.7  PLT 168   ------------------------------------------------------------------------------------------------------------------ Chemistries  Recent Labs  Lab 12/19/17 0415  NA 139  K 3.7  CL 105  CO2 28  GLUCOSE 118*  BUN 24*  CREATININE 1.08  CALCIUM 9.0   ------------------------------------------------------------------------------------------------------------------  Cardiac Enzymes Recent Labs  Lab 12/19/17 0415  TROPONINI <0.03   ------------------------------------------------------------------------------------------------------------------  RADIOLOGY:  Dg Chest 2 View  Result Date: 12/18/2017 CLINICAL DATA:  Cough. EXAM: CHEST - 2 VIEW COMPARISON:  02/15/2017. FINDINGS: Mediastinum and hilar structures normal. Cardiomegaly. Mild diffuse bilateral from interstitial prominence with Kerley B-lines. Mild CHF could present in this fashion. Other interstitial processes  including pneumonitis cannot be excluded. No prominent pleural effusion. No pneumothorax IMPRESSION: Mild cardiomegaly. Mild bilateral pulmonary interstitial prominence with Kerley B-lines. Findings suggest mild CHF. Electronically Signed   By: Maisie Fus  Register   On: 12/18/2017 12:35     ASSESSMENT AND PLAN:  47 year old male patient with history of glaucoma, tobacco abuse, diabetes mellitus type 2 currently under hospitalist service for shortness of breath and elevated BNP  -New onset congestive heart failure Follow-up echocardiogram and cardiology evaluation Diuresis with Lasix Continue ACE inhibitor, beta-blocker Heart healthy diet Fluid restriction input output chart Cycle troponin  -Mildly elevated troponin secondary to demand ischemia from heart failure  -Tobacco abuse Tobacco cessation counseled to the patient for 6 minutes Nicotine patch offered  -DVT prophylaxis subcu Lovenox daily  All the records are reviewed and case discussed with Care Management/Social Worker. Management plans discussed with the patient, family and they are in agreement.  CODE STATUS: Full code  DVT Prophylaxis: SCDs  TOTAL TIME TAKING CARE OF THIS PATIENT: 35 minutes.   POSSIBLE D/C IN 2 to 3 DAYS, DEPENDING ON CLINICAL CONDITION.  Ihor Austin M.D on 12/19/2017 at 12:24 PM  Between 7am to 6pm - Pager - [phone]  After 6pm go to [email or web address] - password [email or web address]  [facility] [place] [facility]  Office  [phone]  CC: Primary care physician; Patient, No Pcp Per  Note: This dictation was prepared with Dragon dictation along with smaller phrase technology. Any transcriptional errors that result from this process are unintentional.

## 2017-12-19 NOTE — Progress Notes (Signed)
Called to patient's room. Patient is adamant that someone told him he could go home today. I explained that the medical team did not feel he was ready to go home quite yet per their note. He and his wife wanted to speak to the primary MD, MD Pyreddy paged.

## 2017-12-19 NOTE — Discharge Summary (Signed)
[facility] [place] at [facility]   PATIENT NAME: Darius [name]    MR#:  [id number]  DATE OF BIRTH:  [birth year]  DATE OF ADMISSION:  12/18/2017 ADMITTING PHYSICIAN: Evelena Asa Salary, MD  DATE OF DISCHARGE: 12/19/2017  PRIMARY CARE PHYSICIAN: 12/19/2017   ADMISSION DIAGNOSIS:  Cough [R05] Acute systolic congestive heart failure (HCC) [I50.21]  DISCHARGE DIAGNOSIS:  Active Problems:   CHF (congestive heart failure) (HCC) Diastolic heart failure Diabetes mellitus type 2 Tobacco abuse  SECONDARY DIAGNOSIS:   Past Medical History:  Diagnosis Date  . Glaucoma      ADMITTING HISTORY Darius [name]  is a 47 y.o. 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  HOSPITAL COURSE:  Agent was admitted to telemetry.  Patient received IV Lasix for diuresis and after being diuresed with Lasix he felt much better.  His shortness of breath has improved.  Mild Lee elevated troponin secondary to demand ischemia.  Patient was worked up with echocardiogram which showed EF of 60 to 65% with normal systolic function.  He was seen by cardiology during the stay in the hospital who recommended low-dose beta-blocker, ACE inhibitor and low-dose diuretic upon discharge.  No further cardiac  intervention recommended.  Tobacco cessation counseled to the patient.  Patient will be discharged home follow-up with cardiology in the clinic.  Advised patient to use a mask when at work to avoid exposure to inhalation dusts.  CONSULTS OBTAINED:  Treatment Team:  Iran Ouch, MD  DRUG ALLERGIES:   Allergies  Allergen Reactions  . Penicillins Other (See Comments)    Patient states that mother is highly allergic. 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  Has patient had a PCN reaction causing immediate rash, facial/tongue/throat swelling, SOB or lightheadedness with hypotension: No Has patient had a PCN reaction causing severe rash involving mucus membranes or skin necrosis: No Has patient had a PCN reaction that required hospitalization Yes Has patient had a PCN reaction occurring within the last 10 years: No If all of the above answers are "N    DISCHARGE MEDICATIONS:   Allergies as of 12/19/2017      Reactions   Penicillins Other (See Comments)   Patient states that mother is highly allergic. 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 Has patient had a PCN reaction causing immediate rash, facial/tongue/throat swelling, SOB or lightheadedness with hypotension: No Has patient had a PCN reaction causing severe rash involving mucus membranes or skin necrosis: No Has patient had a PCN reaction that required hospitalization Yes Has patient had a PCN reaction occurring within the last 10 years: No If all of the above answers are "N      Medication List    TAKE these medications   carvedilol 3.125 MG tablet Commonly known as:  COREG Take  1 tablet (3.125 mg total) by mouth 2 (two) times daily with a meal.   furosemide 20 MG tablet Commonly known as:  LASIX Take 1 tablet (20 mg total) by mouth daily.   lisinopril 2.5 MG tablet Commonly known as:  PRINIVIL,ZESTRIL Take 1 tablet (2.5 mg total) by mouth daily. Start taking on:  12/20/2017    metFORMIN 500 MG tablet Commonly known as:  GLUCOPHAGE Take 1 tablet (500 mg total) by mouth 2 (two) times daily with a meal.       Today  Patient seen today No shortness of breath No chest pain  VITAL SIGNS:  Blood pressure 117/63, pulse 73, temperature 97.9 F (36.6 C), temperature source Oral, resp. rate 18, height 5\' 11"  (1.803 m), weight 68.4 kg, SpO2 100 %.  I/O:    Intake/Output Summary (Last 24 hours) at 12/19/2017 1453 Last data filed at 12/19/2017 0900 Gross per 24 hour  Intake 360 ml  Output 1950 ml  Net -1590 ml    PHYSICAL EXAMINATION:  Physical Exam  GENERAL:  47 y.o.-year-old patient lying in the bed with no acute distress.  LUNGS: Normal breath sounds bilaterally, no wheezing, rales,rhonchi or crepitation. No use of accessory muscles of respiration.  CARDIOVASCULAR: S1, S2 normal. No murmurs, rubs, or gallops.  ABDOMEN: Soft, non-tender, non-distended. Bowel sounds present. No organomegaly or mass.  NEUROLOGIC: Moves all 4 extremities. PSYCHIATRIC: The patient is alert and oriented x 3.  SKIN: No obvious rash, lesion, or ulcer.   DATA REVIEW:   CBC Recent Labs  Lab 12/18/17 1301  WBC 6.0  HGB 15.5  HCT 44.7  PLT 168    Chemistries  Recent Labs  Lab 12/19/17 0415  NA 139  K 3.7  CL 105  CO2 28  GLUCOSE 118*  BUN 24*  CREATININE 1.08  CALCIUM 9.0    Cardiac Enzymes Recent Labs  Lab 12/19/17 0415  TROPONINI <0.03    Microbiology Results  Results for orders placed or performed during the hospital encounter of 10/03/15  Urine culture     Status: Abnormal   Collection Time: 10/03/15  2:07 PM  Result Value Ref Range Status   Specimen Description URINE, CLEAN CATCH  Final   Special Requests NONE  Final   Culture <10,000 COLONIES/mL INSIGNIFICANT GROWTH (A)  Final   Report Status 10/04/2015 FINAL  Final    RADIOLOGY:  Dg Chest 2 View  Result Date: 12/18/2017 CLINICAL DATA:  Cough. EXAM: CHEST - 2 VIEW COMPARISON:  02/15/2017.  FINDINGS: Mediastinum and hilar structures normal. Cardiomegaly. Mild diffuse bilateral from interstitial prominence with Kerley B-lines. Mild CHF could present in this fashion. Other interstitial processes including pneumonitis cannot be excluded. No prominent pleural effusion. No pneumothorax IMPRESSION: Mild cardiomegaly. Mild bilateral pulmonary interstitial prominence with Kerley B-lines. Findings suggest mild CHF. Electronically Signed   By: Maisie Fus  Register   On: 12/18/2017 12:35    Follow up with PCP in 1 week.  Management plans discussed with the patient, family and they are in agreement.  CODE STATUS: Full code    Code Status Orders  (From admission, onward)         Start     Ordered   12/18/17 1634  Full code  Continuous     12/18/17 1633        Code Status History    This patient has a current code status but no historical code status.      TOTAL TIME TAKING CARE OF THIS PATIENT ON  DAY OF DISCHARGE: more than 34 minutes.   Ihor Austin M.D on 12/19/2017 at 2:53 PM  Between 7am to 6pm - Pager - [phone]  After 6pm go to [email or web address] - password [email or web address]  [facility] [place] [facility]  Office  [phone]  CC: Primary care physician; Patient, No Pcp Per  Note: This dictation was prepared with Dragon dictation along with smaller phrase technology. Any transcriptional errors that result from this process are unintentional.

## 2017-12-19 NOTE — Care Management (Signed)
Patient without payer. Medication management and [facility] application given. Patient to use [facility] for new meds now, all discharging medications are on [facility] $4 list. CHF booklet given. Referral made to CHF clinic, patient to make follow up on Monday.  Buddy Duty RN BSN RNCM [phone]

## 2017-12-19 NOTE — Consult Note (Signed)
Cardiology Consultation:   Patient ID: Darius [name]; [id number]; 1970/05/14   Admit date: 12/18/2017 Date of Consult: 12/19/2017  Primary Care Provider: Patient, No Pcp Per Primary Cardiologist: New to [facility] Reason for consult: shortness of breath Physician placing the consult: Dr. Katheren Shams   Patient Profile:   Darius [name] is a 48 y.o. 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, community and standing Past 2 days spent 24 hours standing working on a ceiling, standing, no mass Has noticed worsening shortness of breath when lying supine Small swelling around his ankles Family members in the room reports that he was breathing "-like a fat man" the past 2 weeks, seemed to get worse past 3 days  Presenting to the emergency room Chest x-ray concerning for pulmonary edema BNP 800 Troponin 0.04 Was given IV Lasix placed on oxygen Reports that he had good diuresis, 1600 out More this morning Feels that he is at his baseline  Echocardiogram personally reviewed by myself showing normal ejection fraction with mild to moderately elevated right heart pressures Bicuspid aortic valve with mild to moderate AI  Past Medical History:  Diagnosis Date  . Glaucoma     History reviewed. No pertinent surgical history.   Home Medications:  Prior to Admission medications   Medication Sig Start Date End Date Taking? Authorizing Provider  metFORMIN (GLUCOPHAGE) 500 MG tablet Take 1 tablet (500 mg total) by mouth 2 (two) times daily with a meal. Patient not taking: Reported on 02/02/2017 07/19/16 07/19/17  Jeanmarie Plant, MD    Inpatient Medications: Scheduled Meds: . aspirin EC  81 mg Oral Daily  . carvedilol  3.125 mg Oral BID WC  . enoxaparin (LOVENOX) injection   40 mg Subcutaneous Q24H  . furosemide  20 mg Intravenous Q12H  . lisinopril  2.5 mg Oral Daily  . nicotine  14 mg Transdermal Daily  . sodium chloride flush  3 mL Intravenous Q12H   Continuous Infusions: . sodium chloride     PRN Meds: sodium chloride, acetaminophen, ALPRAZolam, ipratropium-albuterol, morphine injection, nitroGLYCERIN, ondansetron (ZOFRAN) IV, sodium chloride flush  Allergies:    Allergies  Allergen Reactions  . Penicillins Other (See Comments)    Patient states that mother is highly allergic. 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  Has patient had a PCN reaction causing immediate rash, facial/tongue/throat swelling, SOB or lightheadedness with hypotension: No Has patient had a PCN reaction causing severe rash involving mucus membranes or skin necrosis: No Has patient had a PCN reaction that required hospitalization Yes Has patient had a PCN reaction occurring within the last 10 years: No If all of the above answers are "N    Social History:   Social History   Socioeconomic History  . Marital status: Married    Spouse name: Not on file  . Number of children: Not on file  . Years of education: Not on file  . Highest education level: Not on file  Occupational History  . Not on file  Social Needs  . Financial resource strain: Not on file  . Food insecurity:    Worry: Not on file    Inability: Not on file  . Transportation needs:    Medical: Not on file  Non-medical: Not on file  Tobacco Use  . Smoking status: Current Every Day Smoker    Packs/day: 1.00    Types: Cigarettes  . Smokeless tobacco: Never Used  Substance and Sexual Activity  . Alcohol use: Yes    Comment: 32oz beer daily  . Drug use: Yes    Types: Marijuana  . Sexual activity: Not on file  Lifestyle  . Physical activity:    Days per week: Not on file    Minutes per session: Not on file  . Stress: Not on file  Relationships  . Social connections:    Talks on  phone: Not on file    Gets together: Not on file    Attends religious service: Not on file    Active member of club or organization: Not on file    Attends meetings of clubs or organizations: Not on file    Relationship status: Not on file  . Intimate partner violence:    Fear of current or ex partner: Not on file    Emotionally abused: Not on file    Physically abused: Not on file    Forced sexual activity: Not on file  Other Topics Concern  . Not on file  Social History Narrative  . Not on file    Family History:   *No family history on file.   ROS:  Please see the history of present illness.  Review of Systems  Constitutional: Negative.   Respiratory: Positive for shortness of breath.   Cardiovascular: Negative.   Gastrointestinal: Negative.   Musculoskeletal: Negative.   Neurological: Negative.   Psychiatric/Behavioral: Negative.   All other systems reviewed and are negative.   Physical Exam/Data:   Vitals:   12/18/17 1922 12/19/17 0409 12/19/17 0734 12/19/17 0901  BP: 124/71 122/66 117/63   Pulse: 71 67 61 73  Resp:   18   Temp: 98.3 F (36.8 C) 97.8 F (36.6 C) 97.9 F (36.6 C)   TempSrc: Oral Oral Oral   SpO2: 99% 100% 100%   Weight:  68.4 kg    Height:        Intake/Output Summary (Last 24 hours) at 12/19/2017 1336 Last data filed at 12/19/2017 0900 Gross per 24 hour  Intake 360 ml  Output 1950 ml  Net -1590 ml   Filed Weights   12/18/17 1202 12/18/17 1634 12/19/17 0409  Weight: 81.6 kg 69.3 kg 68.4 kg   Body mass index is 21.02 kg/m.  General:  Well nourished, well developed, in no acute distress HEENT: normal Lymph: no adenopathy Neck: no JVD Endocrine:  No thryomegaly Vascular: No carotid bruits; FA pulses 2+ bilaterally without bruits  Cardiac:  normal S1, S2; RRR; no murmur  Lungs:  clear to auscultation bilaterally, no wheezing, rhonchi or rales  Abd: soft, nontender, no hepatomegaly  Ext: no edema Musculoskeletal:  No deformities,  BUE and BLE strength normal and equal Skin: warm and dry  Neuro:  CNs 2-12 intact, no focal abnormalities noted Psych:  Normal affect   EKG:  The EKG was personally reviewed and demonstrates:   Shows normal sinus rhythm rate 78 bpm LVH, no significant ST or T-wave changes  Telemetry:  Telemetry was personally reviewed and demonstrates:   Normal sinus rhythm  Relevant CV Studies: Echocardiogram LVH, normal ejection fraction Bicuspid aortic valve with mild-to-moderate AI Mild to moderately elevated right heart pressures  Laboratory Data:  Chemistry Recent Labs  Lab 12/18/17 1301 12/19/17 0415  NA 141 139  K 4.2  3.7  CL 105 105  CO2 28 28  GLUCOSE 87 118*  BUN 12 24*  CREATININE 1.17 1.08  CALCIUM 9.3 9.0  GFRNONAA >60 >60  GFRAA >60 >60  ANIONGAP 8 6    No results for input(s): PROT, ALBUMIN, AST, ALT, ALKPHOS, BILITOT in the last 168 hours. Hematology Recent Labs  Lab 12/18/17 1301  WBC 6.0  RBC 4.71  HGB 15.5  HCT 44.7  MCV 95.0  MCH 32.9  MCHC 34.6  RDW 13.1  PLT 168   Cardiac Enzymes Recent Labs  Lab 12/18/17 1301 12/18/17 1645 12/18/17 2239 12/19/17 0415  TROPONINI 0.04* 0.03* 0.03* <0.03   No results for input(s): TROPIPOC in the last 168 hours.  BNP Recent Labs  Lab 12/18/17 1301  BNP 842.0*    DDimer No results for input(s): DDIMER in the last 168 hours.  Radiology/Studies:  Dg Chest 2 View  Result Date: 12/18/2017 CLINICAL DATA:  Cough. EXAM: CHEST - 2 VIEW COMPARISON:  02/15/2017. FINDINGS: Mediastinum and hilar structures normal. Cardiomegaly. Mild diffuse bilateral from interstitial prominence with Kerley B-lines. Mild CHF could present in this fashion. Other interstitial processes including pneumonitis cannot be excluded. No prominent pleural effusion. No pneumothorax IMPRESSION: Mild cardiomegaly. Mild bilateral pulmonary interstitial prominence with Kerley B-lines. Findings suggest mild CHF. 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 Dose 5:45 AM. Will give 1 dose now Echocardiogram results reviewed with him Potential discharge is afternoon Would discharge on Lasix 20 mg with potassium 20 meq when necessary for weight gain,  leg swelling,  shortness of breath will likely not require Lasix daily Acceptable to keep him on low-dose beta blocker and ACE inhibitor -Recommended he purchase a mask. N95, to work with given high exposure of drywall dust  Alcohol use Recommended alcohol cessation Likely contributing to symptoms above  Smoker We have encouraged him to continue to work on weaning his cigarettes and smoking cessation. He will continue to work on this and does not want any assistance with chantix.   Bicuspid aortic valve with regurgitation Not surgical at this time Likely chronic issue Will need to be monitored periodically with echocardiogram Would recommend follow-up in cardiology clinic At least on an annual basis  Details of above discussed with patient and family at the bedside  Total encounter time more than 110 minutes  Greater than 50% was spent in counseling and coordination of care with the patient    For questions or updates, please contact [facility] Please consult [email or web address] for contact info under Cardiology/STEMI.   Signed, Julien Nordmann, MD  12/19/2017 1:36 PM

## 2017-12-19 NOTE — Progress Notes (Signed)
Patient given discharge instructions, "living with heartfailure booklet", approximately 30 minutes of time spent on the importance of medication adherence, daily weights, dietary modifications and keeping appointments with providers. PIV removed, and patient walked to care with Elvera Lennox, NT with his wife.

## 2017-12-19 NOTE — Progress Notes (Signed)
[facility] [place] at [facility] was admitted to the Hospital on 12/18/2017 and Discharged  12/19/2017 and should be excused from work/school for above dates.  Call Ihor Austin MD with questions.  Ihor Austin M.D on 12/19/2017,at 2:52 PM  [facility] [place] at [facility]  [phone]

## 2017-12-23 ENCOUNTER — Ambulatory Visit: Payer: Self-pay | Admitting: Internal Medicine

## 2017-12-30 ENCOUNTER — Encounter: Payer: Self-pay | Admitting: Family

## 2017-12-30 ENCOUNTER — Ambulatory Visit: Payer: Self-pay | Admitting: Family

## 2017-12-30 ENCOUNTER — Ambulatory Visit: Payer: Self-pay | Attending: Family | Admitting: Family

## 2017-12-30 VITALS — BP 95/56 | HR 65 | Resp 18 | Ht 74.0 in | Wt 148.4 lb

## 2017-12-30 DIAGNOSIS — I5032 Chronic diastolic (congestive) heart failure: Secondary | ICD-10-CM | POA: Insufficient documentation

## 2017-12-30 DIAGNOSIS — I10 Essential (primary) hypertension: Secondary | ICD-10-CM | POA: Insufficient documentation

## 2017-12-30 DIAGNOSIS — F1990 Other psychoactive substance use, unspecified, uncomplicated: Secondary | ICD-10-CM | POA: Insufficient documentation

## 2017-12-30 DIAGNOSIS — I110 Hypertensive heart disease with heart failure: Secondary | ICD-10-CM | POA: Insufficient documentation

## 2017-12-30 DIAGNOSIS — F17210 Nicotine dependence, cigarettes, uncomplicated: Secondary | ICD-10-CM | POA: Insufficient documentation

## 2017-12-30 DIAGNOSIS — Z7984 Long term (current) use of oral hypoglycemic drugs: Secondary | ICD-10-CM | POA: Insufficient documentation

## 2017-12-30 DIAGNOSIS — H409 Unspecified glaucoma: Secondary | ICD-10-CM | POA: Insufficient documentation

## 2017-12-30 DIAGNOSIS — Z79899 Other long term (current) drug therapy: Secondary | ICD-10-CM | POA: Insufficient documentation

## 2017-12-30 DIAGNOSIS — F1210 Cannabis abuse, uncomplicated: Secondary | ICD-10-CM | POA: Insufficient documentation

## 2017-12-30 DIAGNOSIS — Z880 Allergy status to penicillin: Secondary | ICD-10-CM | POA: Insufficient documentation

## 2017-12-30 DIAGNOSIS — Z720 Tobacco use: Secondary | ICD-10-CM | POA: Insufficient documentation

## 2017-12-30 NOTE — Patient Instructions (Addendum)
Begin weighing daily and call for an overnight weight gain of > 2 pounds or a weekly weight gain of >5 pounds.  Stop taking the coreg (carvedilol)   [facility]   [phone]  [address] [place] [address]  Hours: Tuesday: 4:15 pm - 7:30 pm  Wednesday: 9 am - 1 pm  Thursday: 1 pm - 7:30 pm  Friday - Monday: CLOSED

## 2017-12-30 NOTE — Progress Notes (Signed)
Patient ID: Darius [name], male    DOB: 1970-10-10, 47 y.o.   MRN: [id number]  HPI  Darius [name] is a 47 y/o male with a history of HTN, HF, current tobacco, alcohol and drug use.   Echo report from 12/19/17 reviewed and showed an EF of 60-65% along with mild Darius and mildly elevated PA pressure of 44 mm Hg.   Admitted 12/18/17 due to acute HF. Initially needed IV lasix and then transitioned to oral diuretics. Mildly elevated troponin thought to be due to demand ischemia. Medications started and he was discharged the following day.   He presents today for his initial visit with a chief complaint of moderate fatigue upon minimal exertion. He describes this as being present for several months. He says that his "medications make me tired". He has associated chest pain, light-headedness, difficulty sleeping, snoring and blurry vision along with this. He denies any abdominal distention, palpitations, pedal edema, shortness of breath, cough or weight gain. Says that his glaucoma has worsened since his hospital stay.   Past Medical History:  Diagnosis Date  . Glaucoma    No past surgical history on file. No family history on file. Social History   Tobacco Use  . Smoking status: Current Every Day Smoker    Packs/day: 1.00    Types: Cigarettes  . Smokeless tobacco: Never Used  Substance Use Topics  . Alcohol use: Yes    Comment: 32oz beer daily   Allergies  Allergen Reactions  . Penicillins Other (See Comments)    Patient states that mother is highly allergic. 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  Has patient had a PCN reaction causing immediate rash, facial/tongue/throat swelling, SOB or lightheadedness with hypotension: No Has patient had a PCN reaction causing severe rash involving mucus membranes or skin necrosis: No Has patient had a PCN reaction that required hospitalization Yes Has patient had a PCN reaction occurring within the last 10 years: No If all of the above  answers are "N   Prior to Admission medications   Medication Sig Start Date End Date Taking? Authorizing Provider  carvedilol (COREG) 3.125 MG tablet Take 1 tablet (3.125 mg total) by mouth 2 (two) times daily with a meal. Patient taking differently: Take 3.125 mg by mouth daily.  12/19/17 01/18/18 Yes Pyreddy, Vivien Rota, MD  furosemide (LASIX) 20 MG tablet Take 1 tablet (20 mg total) by mouth daily. 12/19/17 12/19/18 Yes Pyreddy, Vivien Rota, MD  lisinopril (PRINIVIL,ZESTRIL) 2.5 MG tablet Take 1 tablet (2.5 mg total) by mouth daily. 12/20/17 01/19/18 Yes Pyreddy, Vivien Rota, MD  metFORMIN (GLUCOPHAGE) 500 MG tablet Take 1 tablet (500 mg total) by mouth 2 (two) times daily with a meal. Patient not taking: Reported on 12/30/2017 12/19/17 01/18/18  Ihor Austin, MD    Review of Systems  Constitutional: Positive for fatigue. Negative for appetite change.  HENT: Negative for congestion, postnasal drip and sore throat.   Eyes: Positive for visual disturbance (left eye due to glaucoma).  Respiratory: Negative for cough, chest tightness and shortness of breath.   Cardiovascular: Positive for chest pain (last night). Negative for palpitations and leg swelling.  Gastrointestinal: Negative for abdominal distention and abdominal pain.  Endocrine: Negative.   Genitourinary: Negative.   Musculoskeletal: Negative for back pain and neck pain.  Skin: Negative.   Allergic/Immunologic: Negative.   Neurological: Positive for light-headedness. Negative for dizziness.  Hematological: Negative for adenopathy. Does not bruise/bleed easily.  Psychiatric/Behavioral: Positive for sleep disturbance (+ snoring). Negative  for dysphoric mood. The patient is not nervous/anxious.    Vitals:   12/30/17 1212  BP: (!) 95/56  Pulse: 65  Resp: 18  SpO2: 98%  Weight: 148 lb 6 oz (67.3 kg)  Height: 6\' 2"  (1.88 m)   Wt Readings from Last 3 Encounters:  12/30/17 148 lb 6 oz (67.3 kg)  12/19/17 150 lb 11.2 oz (68.4 kg)  02/15/17 165 lb  (74.8 kg)   Lab Results  Component Value Date   CREATININE 1.08 12/19/2017   CREATININE 1.17 12/18/2017   CREATININE 1.15 02/15/2017   Physical Exam  Constitutional: He is oriented to person, place, and time. He appears well-developed and well-nourished.  HENT:  Head: Normocephalic and atraumatic.  Neck: Normal range of motion. Neck supple. No JVD present.  Cardiovascular: Normal rate and regular rhythm.  Pulmonary/Chest: Effort normal. He has no wheezes. He has no rales.  Abdominal: Soft. He exhibits no distension. There is no tenderness.  Musculoskeletal: He exhibits no edema or tenderness.  Neurological: He is alert and oriented to person, place, and time.  Skin: Skin is warm and dry.  Psychiatric: He has a normal mood and affect. His behavior is normal. Thought content normal.  Nursing note and vitals reviewed.  Assessment & Plan:  1: Chronic heart failure with preserved ejection fraction- - NYHA class III - euvolemic today - not weighing daily as he doesn't have any scales. His wife says that she will stop and get scales. Instructed to weigh daily and call for an overnight weight gain of >2 pounds or a weekly weight gain of >5 pounds - not adding salt to his food. Reviewed the importance of closely following a 2000mg  sodium diet and written dietary information was given to him about this.  - information given to him on [facility], charity care and [facility] as he currently doesn't have any insurance - may need sleep study due to snoring and apneic episodes  2: HTN- - BP low today - will stop carvedilol as he's only been taking it daily sporadically as well as his current cocaine use  3: Tobacco use- - smokes 1/2 ppd daily - complete cessation discussed for 3 minutes with him  4: Substance use- - patient admits that he uses marijuana and cocaine every few days - also drinks ~ 36 ounces of beer daily - discussed how his substance use can  negatively affect his heart in the future  Patient did not bring his medications nor a list. Each medication was verbally reviewed with the patient and he was encouraged to bring the bottles to every visit to confirm accuracy of list.  Return in 2 weeks or sooner for any questions/problems before then.

## 2018-01-14 ENCOUNTER — Encounter: Payer: Self-pay | Admitting: Family

## 2018-01-14 ENCOUNTER — Ambulatory Visit: Payer: Self-pay | Attending: Family | Admitting: Family

## 2018-01-14 VITALS — BP 93/59 | HR 55 | Resp 18 | Ht 74.0 in | Wt 153.0 lb

## 2018-01-14 DIAGNOSIS — Z7289 Other problems related to lifestyle: Secondary | ICD-10-CM | POA: Insufficient documentation

## 2018-01-14 DIAGNOSIS — F17210 Nicotine dependence, cigarettes, uncomplicated: Secondary | ICD-10-CM | POA: Insufficient documentation

## 2018-01-14 DIAGNOSIS — Z880 Allergy status to penicillin: Secondary | ICD-10-CM | POA: Insufficient documentation

## 2018-01-14 DIAGNOSIS — H409 Unspecified glaucoma: Secondary | ICD-10-CM | POA: Insufficient documentation

## 2018-01-14 DIAGNOSIS — Z720 Tobacco use: Secondary | ICD-10-CM

## 2018-01-14 DIAGNOSIS — I10 Essential (primary) hypertension: Secondary | ICD-10-CM

## 2018-01-14 DIAGNOSIS — I5032 Chronic diastolic (congestive) heart failure: Secondary | ICD-10-CM | POA: Insufficient documentation

## 2018-01-14 DIAGNOSIS — I110 Hypertensive heart disease with heart failure: Secondary | ICD-10-CM | POA: Insufficient documentation

## 2018-01-14 DIAGNOSIS — F1990 Other psychoactive substance use, unspecified, uncomplicated: Secondary | ICD-10-CM | POA: Insufficient documentation

## 2018-01-14 DIAGNOSIS — Z79899 Other long term (current) drug therapy: Secondary | ICD-10-CM | POA: Insufficient documentation

## 2018-01-14 NOTE — Progress Notes (Signed)
Patient ID: Darius [name], male    DOB: 1970-09-22, 47 y.o.   MRN: [id number]  HPI  Darius [name] is a 47 y/o male with a history of HTN, HF, current tobacco, alcohol and drug use.   Echo report from 12/19/17 reviewed and showed an EF of 60-65% along with mild Darius and mildly elevated PA pressure of 44 mm Hg.   Admitted 12/18/17 due to acute HF. Initially needed IV lasix and then transitioned to oral diuretics. Mildly elevated troponin thought to be due to demand ischemia. Medications started and he was discharged the following day.   He presents today for a follow-up visit with a chief complaint of minimal fatigue upon moderate exertion. He describes this as having been present for several months. He does feel like his energy level is improving. He has associated light-headedness, difficulty sleeping, occasional chest pain and gradual weight gain. He denies any abdominal distention, palpitations, pedal edema, shortness of breath or cough.   Past Medical History:  Diagnosis Date  . CHF (congestive heart failure) (HCC)   . Glaucoma   . Hypertension    No past surgical history on file. No family history on file. Social History   Tobacco Use  . Smoking status: Current Every Day Smoker    Packs/day: 1.00    Types: Cigarettes  . Smokeless tobacco: Never Used  Substance Use Topics  . Alcohol use: Yes    Comment: 32oz beer daily   Allergies  Allergen Reactions  . Penicillins Other (See Comments)    Patient states that mother is highly allergic. 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  Has patient had a PCN reaction causing immediate rash, facial/tongue/throat swelling, SOB or lightheadedness with hypotension: No Has patient had a PCN reaction causing severe rash involving mucus membranes or skin necrosis: No Has patient had a PCN reaction that required hospitalization Yes Has patient had a PCN reaction occurring within the last 10 years: No If all of the above answers are "N    Prior to Admission medications   Medication Sig Start Date End Date Taking? Authorizing Provider  furosemide (LASIX) 20 MG tablet Take 1 tablet (20 mg total) by mouth daily. 12/19/17 12/19/18 Yes Pyreddy, Vivien Rota, MD  lisinopril (PRINIVIL,ZESTRIL) 2.5 MG tablet Take 1 tablet (2.5 mg total) by mouth daily. 12/20/17 01/19/18 Yes PyreddyVivien Rota, MD    Review of Systems  Constitutional: Positive for fatigue. Negative for appetite change.  HENT: Negative for congestion, postnasal drip and sore throat.   Eyes: Positive for visual disturbance (left eye due to glaucoma).  Respiratory: Positive for apnea. Negative for cough, chest tightness and shortness of breath.        + snoring  Cardiovascular: Positive for chest pain (last night). Negative for palpitations and leg swelling.  Gastrointestinal: Negative for abdominal distention and abdominal pain.  Endocrine: Negative.   Genitourinary: Negative.   Musculoskeletal: Negative for back pain and neck pain.  Skin: Negative.   Allergic/Immunologic: Negative.   Neurological: Positive for light-headedness. Negative for dizziness.  Hematological: Negative for adenopathy. Does not bruise/bleed easily.  Psychiatric/Behavioral: Positive for sleep disturbance (+ snoring). Negative for dysphoric mood. The patient is not nervous/anxious.    Vitals:   01/14/18 1102  BP: (!) 93/59  Pulse: (!) 55  Resp: 18  SpO2: 100%  Weight: 153 lb (69.4 kg)  Height: 6\' 2"  (1.88 m)   Wt Readings from Last 3 Encounters:  01/14/18 153 lb (69.4 kg)  12/30/17 148  lb 6 oz (67.3 kg)  12/19/17 150 lb 11.2 oz (68.4 kg)   Lab Results  Component Value Date   CREATININE 1.08 12/19/2017   CREATININE 1.17 12/18/2017   CREATININE 1.15 02/15/2017   Physical Exam  Constitutional: He is oriented to person, place, and time. He appears well-developed and well-nourished.  HENT:  Head: Normocephalic and atraumatic.  Neck: Normal range of motion. Neck supple. No JVD present.   Cardiovascular: Normal rate and regular rhythm.  Pulmonary/Chest: Effort normal. He has no wheezes. He has no rales.  Abdominal: Soft. He exhibits no distension. There is no tenderness.  Musculoskeletal: He exhibits no edema or tenderness.  Neurological: He is alert and oriented to person, place, and time.  Skin: Skin is warm and dry.  Psychiatric: He has a normal mood and affect. His behavior is normal. Thought content normal.  Nursing note and vitals reviewed.  Assessment & Plan:  1: Chronic heart failure with preserved ejection fraction- - NYHA class II - euvolemic today - weighing daily and he was reminded to call for an overnight weight gain of >2 pounds or a weekly weight gain of >5 pounds - weight up 5 pounds since he was last here 2 weeks ago - he says that he's been eating more since he's trying to quit smoking - not adding salt to his food. Reviewed the importance of closely following a 2000mg  sodium diet   - he has the paperwork for [facility], [facility] care and [facility] that he has to turn in - will make sleep study referral - BNP 12/18/17 was 842.0  2: HTN- - BP on the low side again - carvedilol stopped at his last visit; will stop furosemide today  - advised him to take the furosemide if he notices any edema or if he has weight gain per above parameters - BMP on 12/19/17 reviewed and showed sodium 139, potassium 3.7, creatinine 1.08 and GFR >60  3: Tobacco use- - smokes 1/2 ppd daily but is trying to quit; has been eating more since not smoking as much - complete cessation discussed for 3 minutes with him  4: Substance use- - patient says that he hasn't used marijuana nor cocaine since he was last here (not using cocaine could also be why his appetite has increased) - drank 36 ounces of beer last weekend but is not drinking daily now - discussed how his substance use can negatively affect his heart in the future  Patient did not bring his  medications nor a list. Each medication was verbally reviewed with the patient and he was encouraged to bring the bottles to every visit to confirm accuracy of list.  Return in 2 weeks or sooner for any questions/problems before then.

## 2018-01-14 NOTE — Patient Instructions (Addendum)
Continue weighing daily and call for an overnight weight gain of > 2 pounds or a weekly weight gain of >5 pounds.  Take the lasix if needed for above weight gain or for any swelling in the legs.

## 2018-01-26 NOTE — Progress Notes (Signed)
Patient ID: Darius [name], male    DOB: 1970/12/13, 47 y.o.   MRN: [id number]  HPI  Mr Pressley is a 47 y/o male with a history of HTN, HF, current tobacco, alcohol and drug use.   Echo report from 12/19/17 reviewed and showed an EF of 60-65% along with mild MR and mildly elevated PA pressure of 44 mm Hg.   Admitted 12/18/17 due to acute HF. Initially needed IV lasix and then transitioned to oral diuretics. Mildly elevated troponin thought to be due to demand ischemia. Medications started and he was discharged the following day.   He presents today for a follow-up visit with a chief complaint of minimal fatigue upon moderate exertion. He describes this as having been present for several months. He has associated chest pain and difficulty sleeping along with this. He denies any abdominal distention, palpitations, pedal edema, shortness of breath, cough, dizziness or weight gain. Has relapsed with his alcohol and drug use.    Past Medical History:  Diagnosis Date  . CHF (congestive heart failure) (HCC)   . Glaucoma   . Hypertension    No past surgical history on file. No family history on file. Social History   Tobacco Use  . Smoking status: Current Every Day Smoker    Packs/day: 1.00    Types: Cigarettes  . Smokeless tobacco: Never Used  Substance Use Topics  . Alcohol use: Yes    Comment: 32oz beer daily 9/19- has had 3 beers since last visit   Allergies  Allergen Reactions  . Penicillins Other (See Comments)    Patient states that mother is highly allergic. 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  Has patient had a PCN reaction causing immediate rash, facial/tongue/throat swelling, SOB or lightheadedness with hypotension: No Has patient had a PCN reaction causing severe rash involving mucus membranes or skin necrosis: No Has patient had a PCN reaction that required hospitalization Yes Has patient had a PCN reaction occurring within the last 10 years: No If all of  the above answers are "N   Prior to Admission medications   Medication Sig Start Date End Date Taking? Authorizing Provider  lisinopril (PRINIVIL,ZESTRIL) 2.5 MG tablet Take 1 tablet (2.5 mg total) by mouth daily. 01/28/18 02/27/18 Yes Delma Freeze, FNP    Review of Systems  Constitutional: Positive for fatigue. Negative for appetite change.  HENT: Negative for congestion, postnasal drip and sore throat.   Eyes: Positive for visual disturbance (left eye due to glaucoma).  Respiratory: Positive for apnea. Negative for cough, chest tightness and shortness of breath.        + snoring  Cardiovascular: Positive for chest pain (couple of days ago). Negative for palpitations and leg swelling.  Gastrointestinal: Negative for abdominal distention and abdominal pain.  Endocrine: Negative.   Genitourinary: Negative.   Musculoskeletal: Negative for back pain and neck pain.  Skin: Negative.   Allergic/Immunologic: Negative.   Neurological: Negative for dizziness and light-headedness.  Hematological: Negative for adenopathy. Does not bruise/bleed easily.  Psychiatric/Behavioral: Positive for sleep disturbance (+ snoring). Negative for dysphoric mood. The patient is not nervous/anxious.    Vitals:   01/28/18 1123  BP: (!) 106/57  Pulse: (!) 58  Resp: 18  SpO2: 96%  Weight: 151 lb 2 oz (68.5 kg)  Height: 6\' 2"  (1.88 m)   Wt Readings from Last 3 Encounters:  01/28/18 151 lb 2 oz (68.5 kg)  01/14/18 153 lb (69.4 kg)  12/30/17 148  lb 6 oz (67.3 kg)   Lab Results  Component Value Date   CREATININE 1.08 12/19/2017   CREATININE 1.17 12/18/2017   CREATININE 1.15 02/15/2017    Physical Exam  Constitutional: He is oriented to person, place, and time. He appears well-developed and well-nourished.  HENT:  Head: Normocephalic and atraumatic.  Neck: Normal range of motion. Neck supple. No JVD present.  Cardiovascular: Normal rate and regular rhythm.  Pulmonary/Chest: Effort normal. He has no  wheezes. He has no rales.  Abdominal: Soft. He exhibits no distension. There is no tenderness.  Musculoskeletal: He exhibits no edema or tenderness.  Neurological: He is alert and oriented to person, place, and time.  Skin: Skin is warm and dry.  Psychiatric: He has a normal mood and affect. His behavior is normal. Thought content normal.  Nursing note and vitals reviewed.  Assessment & Plan:  1: Chronic heart failure with preserved ejection fraction- - NYHA class II - euvolemic today - weighing daily and he was reminded to call for an overnight weight gain of >2 pounds or a weekly weight gain of >5 pounds - weight down 2 pounds from last visit 2 weeks ago - he says that he's been eating more and feels hungry "all the time" - not adding salt to his food. Reviewed the importance of closely following a 2000mg  sodium diet   - he has the paperwork for [facility], [facility] and [facility] that he has to turn in - sleep study referral has been made - BNP 12/18/17 was 842.0  2: HTN- - BP better today - furosemide stopped at his last visit - advised him to take the furosemide if he notices any edema or if he has weight gain per above parameters - BMP on 12/19/17 reviewed and showed sodium 139, potassium 3.7, creatinine 1.08 and GFR >60  3: Tobacco use- - smokes 1/2 ppd daily but is trying to quit; has been eating more since not smoking as much - complete cessation discussed for 3 minutes with him  4: Substance use- - used cocaine and marijuana 4 days ago (01/24/18) and now has permanently lost his license due to a missed court date - drank alcohol over the weekend as well - information given to him regarding AA meetings, isn't interested in narcotics anonymous - also encouraged he and his wife to get some marital counseling as they have brought up many issues/ feelings between the two of them - encouraged him today to not look back but to make changes so that he's  not around the temptation  Medication bottle reviewed.  Return in 1 month or sooner for any questions/problems before then.

## 2018-01-28 ENCOUNTER — Encounter: Payer: Self-pay | Admitting: Family

## 2018-01-28 ENCOUNTER — Ambulatory Visit: Payer: Self-pay | Attending: Family | Admitting: Family

## 2018-01-28 VITALS — BP 106/57 | HR 58 | Resp 18 | Ht 74.0 in | Wt 151.1 lb

## 2018-01-28 DIAGNOSIS — I10 Essential (primary) hypertension: Secondary | ICD-10-CM

## 2018-01-28 DIAGNOSIS — F1210 Cannabis abuse, uncomplicated: Secondary | ICD-10-CM | POA: Insufficient documentation

## 2018-01-28 DIAGNOSIS — R5383 Other fatigue: Secondary | ICD-10-CM | POA: Insufficient documentation

## 2018-01-28 DIAGNOSIS — F17210 Nicotine dependence, cigarettes, uncomplicated: Secondary | ICD-10-CM | POA: Insufficient documentation

## 2018-01-28 DIAGNOSIS — F1410 Cocaine abuse, uncomplicated: Secondary | ICD-10-CM | POA: Insufficient documentation

## 2018-01-28 DIAGNOSIS — H409 Unspecified glaucoma: Secondary | ICD-10-CM | POA: Insufficient documentation

## 2018-01-28 DIAGNOSIS — F1990 Other psychoactive substance use, unspecified, uncomplicated: Secondary | ICD-10-CM

## 2018-01-28 DIAGNOSIS — I5032 Chronic diastolic (congestive) heart failure: Secondary | ICD-10-CM

## 2018-01-28 DIAGNOSIS — I110 Hypertensive heart disease with heart failure: Secondary | ICD-10-CM | POA: Insufficient documentation

## 2018-01-28 DIAGNOSIS — Z880 Allergy status to penicillin: Secondary | ICD-10-CM | POA: Insufficient documentation

## 2018-01-28 DIAGNOSIS — R079 Chest pain, unspecified: Secondary | ICD-10-CM | POA: Insufficient documentation

## 2018-01-28 DIAGNOSIS — Z720 Tobacco use: Secondary | ICD-10-CM

## 2018-01-28 DIAGNOSIS — I509 Heart failure, unspecified: Secondary | ICD-10-CM | POA: Insufficient documentation

## 2018-01-28 MED ORDER — LISINOPRIL 2.5 MG PO TABS: 2.5000 mg | ORAL_TABLET | Freq: Every day | ORAL | 5 refills | Status: DC

## 2018-01-28 NOTE — Patient Instructions (Signed)
Continue weighing daily and call for an overnight weight gain of > 2 pounds or a weekly weight gain of >5 pounds. 

## 2018-02-25 ENCOUNTER — Ambulatory Visit: Payer: Self-pay | Admitting: Family

## 2018-02-26 NOTE — Progress Notes (Signed)
Patient ID: Darius [name], male    DOB: 08-10-1970, 47 y.o.   MRN: [id number]  HPI  Darius [name] is a 47 y/o male with a history of HTN, HF, current tobacco, alcohol and drug use.   Echo report from 12/19/17 reviewed and showed an EF of 60-65% along with mild Darius and mildly elevated PA pressure of 44 mm Hg.   Admitted 12/18/17 due to acute HF. Initially needed IV lasix and then transitioned to oral diuretics. Mildly elevated troponin thought to be due to demand ischemia. Medications started and he was discharged the following day.   He presents today for a follow-up visit with a chief complaint of minimal shortness of breath upon moderate exertion. He says that this has been present for several months. He has associated fatigue, apnea, cough, intermittent arm numbness, abdominal pain and difficulty sleeping along with this. He denies any abdominal distention, palpitations, pedal edema or dizziness. He says that his abdominal pain tends to occur and worsen after he's been drinking alcohol. Said that he didn't take his lisinopril for ~ 4 days and he developed swelling in his right leg. When he resumed the lisinopril, the edema dissolved.   Past Medical History:  Diagnosis Date  . CHF (congestive heart failure) (HCC)   . Glaucoma   . Hypertension    No past surgical history on file. No family history on file. Social History   Tobacco Use  . Smoking status: Current Every Day Smoker    Packs/day: 1.00    Types: Cigarettes  . Smokeless tobacco: Never Used  Substance Use Topics  . Alcohol use: Yes    Comment: 32oz beer daily 9/19- has had 3 beers since last visit   Allergies  Allergen Reactions  . Penicillins Other (See Comments)    Patient states that mother is highly allergic. 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  Has patient had a PCN reaction causing immediate rash, facial/tongue/throat swelling, SOB or lightheadedness with hypotension: No Has patient had a PCN reaction  causing severe rash involving mucus membranes or skin necrosis: No Has patient had a PCN reaction that required hospitalization Yes Has patient had a PCN reaction occurring within the last 10 years: No If all of the above answers are "N   Prior to Admission medications   Medication Sig Start Date End Date Taking? Authorizing Provider  lisinopril (PRINIVIL,ZESTRIL) 2.5 MG tablet Take 2.5 mg by mouth daily.   Yes [provider]    Review of Systems  Constitutional: Positive for fatigue. Negative for appetite change.  HENT: Negative for congestion, postnasal drip and sore throat.   Eyes: Positive for visual disturbance (left eye due to glaucoma).  Respiratory: Positive for apnea, cough and shortness of breath. Negative for chest tightness.        + snoring  Cardiovascular: Positive for chest pain (couple of days ago). Negative for palpitations and leg swelling.  Gastrointestinal: Positive for abdominal pain (when drinks alcohol). Negative for abdominal distention.  Endocrine: Negative.   Genitourinary: Negative.   Musculoskeletal: Positive for arthralgias (right leg pain). Negative for back pain and neck pain.  Skin: Negative.   Allergic/Immunologic: Negative.   Neurological: Positive for numbness (in arm at times). Negative for dizziness and light-headedness.  Hematological: Negative for adenopathy. Does not bruise/bleed easily.  Psychiatric/Behavioral: Positive for sleep disturbance (+ snoring/ grandma died last week). Negative for dysphoric mood. The patient is not nervous/anxious.    Vitals:   03/01/18 1030  BP: 120/64  Pulse: 68  Resp: 18  Temp: 98.4 F (36.9 C)  TempSrc: Oral  SpO2: 98%  Weight: 153 lb 9.6 oz (69.7 kg)  Height: 6\' 1"  (1.854 m)   Wt Readings from Last 3 Encounters:  03/01/18 153 lb 9.6 oz (69.7 kg)  01/28/18 151 lb 2 oz (68.5 kg)  01/14/18 153 lb (69.4 kg)   Lab Results  Component Value Date   CREATININE 1.08 12/19/2017   CREATININE 1.17  12/18/2017   CREATININE 1.15 02/15/2017    Physical Exam  Constitutional: He is oriented to person, place, and time. He appears well-developed and well-nourished.  HENT:  Head: Normocephalic and atraumatic.  Neck: Normal range of motion. Neck supple. No JVD present.  Cardiovascular: Normal rate and regular rhythm.  Pulmonary/Chest: Effort normal. He has no wheezes. He has no rales.  Abdominal: Soft. He exhibits no distension. There is no tenderness.  Musculoskeletal: He exhibits no edema or tenderness.  Neurological: He is alert and oriented to person, place, and time.  Skin: Skin is warm and dry.  Psychiatric: He has a normal mood and affect. His behavior is normal. Thought content normal.  Nursing note and vitals reviewed.  Assessment & Plan:  1: Chronic heart failure with preserved ejection fraction- - NYHA class II - euvolemic today - weighing daily and he was reminded to call for an overnight weight gain of >2 pounds or a weekly weight gain of >5 pounds - weight up 2 pounds since he was last here 1 month ago - not adding salt to his food. Reviewed the importance of closely following a 2000mg  sodium diet   - sleep study referral has been made and he says that they called him but he had to change phones. Number to sleep med given to patient and his wife for them to call to get this scheduled.  - BNP 12/18/17 was 842.0  2: HTN- - BP looks good today - he hasn't taken any furosemide - BMP on 12/19/17 reviewed and showed sodium 139, potassium 3.7, creatinine 1.08 and GFR >60  3: Tobacco use- - smokes 1/2 ppd daily but is trying to quit - complete cessation discussed for 3 minutes with him  4: Substance use- - used cocaine 2 days ago and marijuana "as much as possible" - drinks 2 beers frequently - says his grandmother recently died and he's feeling angry and hurt about this  - encouraged him to find alternative ways of dealing with stress and emphasized that continuing to use  cocaine may ultimately lead to worsening health issues and/ or death - nonfasting glucose in clinic today was 134  Medication bottle reviewed.  Return in 2 months or sooner for any questions/problems before then.

## 2018-03-01 ENCOUNTER — Encounter: Payer: Self-pay | Admitting: Family

## 2018-03-01 ENCOUNTER — Other Ambulatory Visit: Payer: Self-pay

## 2018-03-01 ENCOUNTER — Ambulatory Visit: Payer: Self-pay | Attending: Family | Admitting: Family

## 2018-03-01 VITALS — BP 120/64 | HR 68 | Temp 98.4°F | Resp 18 | Ht 73.0 in | Wt 153.6 lb

## 2018-03-01 DIAGNOSIS — Z720 Tobacco use: Secondary | ICD-10-CM

## 2018-03-01 DIAGNOSIS — I10 Essential (primary) hypertension: Secondary | ICD-10-CM

## 2018-03-01 DIAGNOSIS — I5032 Chronic diastolic (congestive) heart failure: Secondary | ICD-10-CM | POA: Insufficient documentation

## 2018-03-01 DIAGNOSIS — F1990 Other psychoactive substance use, unspecified, uncomplicated: Secondary | ICD-10-CM

## 2018-03-01 DIAGNOSIS — I110 Hypertensive heart disease with heart failure: Secondary | ICD-10-CM | POA: Insufficient documentation

## 2018-03-01 DIAGNOSIS — R0602 Shortness of breath: Secondary | ICD-10-CM | POA: Insufficient documentation

## 2018-03-01 DIAGNOSIS — F17210 Nicotine dependence, cigarettes, uncomplicated: Secondary | ICD-10-CM | POA: Insufficient documentation

## 2018-03-01 DIAGNOSIS — Z79899 Other long term (current) drug therapy: Secondary | ICD-10-CM | POA: Insufficient documentation

## 2018-03-01 DIAGNOSIS — Z880 Allergy status to penicillin: Secondary | ICD-10-CM | POA: Insufficient documentation

## 2018-03-01 LAB — GLUCOSE, CAPILLARY: Glucose-Capillary: 134 mg/dL — ABNORMAL HIGH (ref 70–99)

## 2018-03-01 NOTE — Patient Instructions (Signed)
Continue weighing daily and call for an overnight weight gain of > 2 pounds or a weekly weight gain of >5 pounds. 

## 2018-04-11 NOTE — Progress Notes (Signed)
Patient ID: Darius [name], male    DOB: May 02, 1970, 47 y.o.   MRN: [id number]  HPI  Darius [name] is a 47 y/o male with a history of HTN, HF, current tobacco, alcohol and drug use.   Echo report from 12/19/17 reviewed and showed an EF of 60-65% along with mild Darius and mildly elevated PA pressure of 44 mm Hg.   Admitted 12/18/17 due to acute HF. Initially needed IV lasix and then transitioned to oral diuretics. Mildly elevated troponin thought to be due to demand ischemia. Medications started and he was discharged the following day.   He presents today for a follow-up visit with a chief complaint of minimal fatigue upon moderate exertion. He describes this as chronic in nature having been present for several years. He has associated cough, shortness of breath, wheezing, pedal edema, difficulty sleeping and gradual weight gain along with this. He denies any dizziness, palpitations or chest pain. He says that he's been eating "anything and everything" so figures that he's eaten more salt than he normally does. Has only taken his lisinopril sporadically but recognizes that when he takes it, he feels better and has less swelling.   Past Medical History:  Diagnosis Date  . CHF (congestive heart failure) (HCC)   . Glaucoma   . Hypertension    No past surgical history on file. No family history on file. Social History   Tobacco Use  . Smoking status: Current Every Day Smoker    Packs/day: 1.00    Types: Cigarettes  . Smokeless tobacco: Never Used  Substance Use Topics  . Alcohol use: Yes    Comment: 32oz beer daily 9/19- has had 3 beers since last visit   Allergies  Allergen Reactions  . Penicillins Other (See Comments)    Patient states that mother is highly allergic. 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  Has patient had a PCN reaction causing immediate rash, facial/tongue/throat swelling, SOB or lightheadedness with hypotension: No Has patient had a PCN reaction causing  severe rash involving mucus membranes or skin necrosis: No Has patient had a PCN reaction that required hospitalization Yes Has patient had a PCN reaction occurring within the last 10 years: No If all of the above answers are "N   Prior to Admission medications   Medication Sig Start Date End Date Taking? Authorizing Provider  lisinopril (PRINIVIL,ZESTRIL) 2.5 MG tablet Take 2.5 mg by mouth daily.    [provider]    Review of Systems  Constitutional: Positive for fatigue. Negative for appetite change.  HENT: Negative for congestion, postnasal drip and sore throat.   Eyes: Positive for visual disturbance (left eye due to glaucoma).  Respiratory: Positive for apnea, cough (productive cough), shortness of breath and wheezing. Negative for chest tightness.        + snoring  Cardiovascular: Positive for leg swelling (right ankle). Negative for chest pain and palpitations.  Gastrointestinal: Negative for abdominal distention and abdominal pain.  Endocrine: Negative.   Genitourinary: Negative.   Musculoskeletal: Positive for arthralgias (right leg pain) and back pain (spasms). Negative for neck pain.  Skin: Negative.   Allergic/Immunologic: Negative.   Neurological: Negative for dizziness and light-headedness.  Hematological: Negative for adenopathy. Does not bruise/bleed easily.  Psychiatric/Behavioral: Positive for sleep disturbance (+ snoring). Negative for dysphoric mood. The patient is not nervous/anxious.    Vitals:   04/12/18 0847  BP: (!) 102/58  Pulse: 65  Resp: 18  SpO2: 100%  Weight:  157 lb 2 oz (71.3 kg)  Height: 6\' 2"  (1.88 m)   Wt Readings from Last 3 Encounters:  04/12/18 157 lb 2 oz (71.3 kg)  03/01/18 153 lb 9.6 oz (69.7 kg)  01/28/18 151 lb 2 oz (68.5 kg)   Lab Results  Component Value Date   CREATININE 1.08 12/19/2017   CREATININE 1.17 12/18/2017   CREATININE 1.15 02/15/2017    Physical Exam Vitals signs and nursing note reviewed.   Constitutional:      Appearance: He is well-developed.  HENT:     Head: Normocephalic and atraumatic.  Neck:     Musculoskeletal: Normal range of motion and neck supple.     Vascular: No JVD.  Cardiovascular:     Rate and Rhythm: Normal rate and regular rhythm.  Pulmonary:     Effort: Pulmonary effort is normal.     Breath sounds: No wheezing or rales.  Abdominal:     General: There is no distension.     Palpations: Abdomen is soft.     Tenderness: There is no abdominal tenderness.  Musculoskeletal:        General: No tenderness.     Right lower leg: He exhibits no tenderness. Edema (trace pitting) present.     Left lower leg: He exhibits no tenderness. Edema (trace pitting) present.  Skin:    General: Skin is warm and dry.  Neurological:     Mental Status: He is alert and oriented to person, place, and time.  Psychiatric:        Behavior: Behavior normal.        Thought Content: Thought content normal.    Assessment & Plan:  1: Chronic heart failure with preserved ejection fraction- - NYHA class II - euvolemic today - weighing daily and he was reminded to call for an overnight weight gain of >2 pounds or a weekly weight gain of >5 pounds - weight up 4 pounds from last visit 1 month ago - not adding salt to his food but says that he's been eating "everything" so figures that he's been getting more salt than he normally does. Reviewed the importance of closely following a 2000mg  sodium diet.    - he is going to start taking the lisinopril on a daily basis instead of sporadically - number to sleep med given to patient again and explained that he needed to call them to get the sleep study scheduled. - BNP 12/18/17 was 842.0  2: HTN- - BP looks good although on the low side - he hasn't taken any furosemide - information given to him regarding [facility] and explained that he needed to get established with a PCP there - wife says that they've filled out all the  paperwork regarding charity care and sent that in - BMP on 12/19/17 reviewed and showed sodium 139, potassium 3.7, creatinine 1.08 and GFR >60  3: Tobacco use- - smokes 1/2 ppd daily but is trying to quit - complete cessation discussed for 3 minutes with him  4: Substance use- - no cocaine since last visit here but continues to smoke marijuana - drinks 2 beers frequently, sometimes more/ sometimes less; did drink ~ 20 beers one day last week when his back was hurting; reminded that there is sodium in beer - says that he's still tearful and hurt regarding the death of his grandmother ~ 1 month ago   Medication bottle reviewed.  Return in 6 weeks or sooner for any questions/problems before then.

## 2018-04-12 ENCOUNTER — Ambulatory Visit: Payer: Self-pay | Attending: Family | Admitting: Family

## 2018-04-12 ENCOUNTER — Encounter: Payer: Self-pay | Admitting: Family

## 2018-04-12 VITALS — BP 102/58 | HR 65 | Resp 18 | Ht 74.0 in | Wt 157.1 lb

## 2018-04-12 DIAGNOSIS — H409 Unspecified glaucoma: Secondary | ICD-10-CM | POA: Insufficient documentation

## 2018-04-12 DIAGNOSIS — Z720 Tobacco use: Secondary | ICD-10-CM

## 2018-04-12 DIAGNOSIS — F1290 Cannabis use, unspecified, uncomplicated: Secondary | ICD-10-CM | POA: Insufficient documentation

## 2018-04-12 DIAGNOSIS — I110 Hypertensive heart disease with heart failure: Secondary | ICD-10-CM | POA: Insufficient documentation

## 2018-04-12 DIAGNOSIS — I10 Essential (primary) hypertension: Secondary | ICD-10-CM

## 2018-04-12 DIAGNOSIS — F1990 Other psychoactive substance use, unspecified, uncomplicated: Secondary | ICD-10-CM

## 2018-04-12 DIAGNOSIS — I5032 Chronic diastolic (congestive) heart failure: Secondary | ICD-10-CM

## 2018-04-12 DIAGNOSIS — F17210 Nicotine dependence, cigarettes, uncomplicated: Secondary | ICD-10-CM | POA: Insufficient documentation

## 2018-04-12 DIAGNOSIS — Z7289 Other problems related to lifestyle: Secondary | ICD-10-CM | POA: Insufficient documentation

## 2018-04-12 DIAGNOSIS — Z79899 Other long term (current) drug therapy: Secondary | ICD-10-CM | POA: Insufficient documentation

## 2018-04-12 DIAGNOSIS — I509 Heart failure, unspecified: Secondary | ICD-10-CM | POA: Insufficient documentation

## 2018-04-12 NOTE — Patient Instructions (Addendum)
Continue weighing daily and call for an overnight weight gain of > 2 pounds or a weekly weight gain of >5 pounds.  [facility]   [phone]  [address]  [place] [address]  Hours: Tuesday: 4:15 pm - 7:30 pm  Wednesday: 9 am - 1 pm  Thursday: 1 pm - 7:30 pm  Friday - Monday: CLOSED 

## 2018-05-04 ENCOUNTER — Ambulatory Visit: Payer: Self-pay | Admitting: Family

## 2018-05-21 NOTE — Progress Notes (Deleted)
 Patient ID: Darius [name], male    DOB: 08/26/1970, 47 y.o.   MRN: [id number]  HPI  Darius [name] is a 47 y/o male with a history of HTN, HF, current tobacco, alcohol and drug use.   Echo report from 12/19/17 reviewed and showed an EF of 60-65% along with mild Darius and mildly elevated PA pressure of 44 mm Hg.   Admitted 12/18/17 due to acute HF. Initially needed IV lasix and then transitioned to oral diuretics. Mildly elevated troponin thought to be due to demand ischemia. Medications started and he was discharged the following day.   He presents today for a follow-up visit with a chief complaint of   Past Medical History:  Diagnosis Date  . CHF (congestive heart failure) (HCC)   . Glaucoma   . Hypertension    No past surgical history on file. No family history on file. Social History   Tobacco Use  . Smoking status: Current Every Day Smoker    Packs/day: 1.00    Types: Cigarettes  . Smokeless tobacco: Never Used  Substance Use Topics  . Alcohol use: Yes    Comment: 32oz beer daily 9/19- has had 3 beers since last visit   Allergies  Allergen Reactions  . Penicillins Other (See Comments)    Patient states that mother is highly allergic. 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  Has patient had a PCN reaction causing immediate rash, facial/tongue/throat swelling, SOB or lightheadedness with hypotension: No Has patient had a PCN reaction causing severe rash involving mucus membranes or skin necrosis: No Has patient had a PCN reaction that required hospitalization Yes Has patient had a PCN reaction occurring within the last 10 years: No If all of the above answers are "N     Review of Systems  Constitutional: Positive for fatigue. Negative for appetite change.  HENT: Negative for congestion, postnasal drip and sore throat.   Eyes: Positive for visual disturbance (left eye due to glaucoma).  Respiratory: Positive for apnea, cough (productive cough), shortness of  breath and wheezing. Negative for chest tightness.        + snoring  Cardiovascular: Positive for leg swelling (right ankle). Negative for chest pain and palpitations.  Gastrointestinal: Negative for abdominal distention and abdominal pain.  Endocrine: Negative.   Genitourinary: Negative.   Musculoskeletal: Positive for arthralgias (right leg pain) and back pain (spasms). Negative for neck pain.  Skin: Negative.   Allergic/Immunologic: Negative.   Neurological: Negative for dizziness and light-headedness.  Hematological: Negative for adenopathy. Does not bruise/bleed easily.  Psychiatric/Behavioral: Positive for sleep disturbance (+ snoring). Negative for dysphoric mood. The patient is not nervous/anxious.      Physical Exam Vitals signs and nursing note reviewed.  Constitutional:      Appearance: He is well-developed.  HENT:     Head: Normocephalic and atraumatic.  Neck:     Musculoskeletal: Normal range of motion and neck supple.     Vascular: No JVD.  Cardiovascular:     Rate and Rhythm: Normal rate and regular rhythm.  Pulmonary:     Effort: Pulmonary effort is normal.     Breath sounds: No wheezing or rales.  Abdominal:     General: There is no distension.     Palpations: Abdomen is soft.     Tenderness: There is no abdominal tenderness.  Musculoskeletal:        General: No tenderness.     Right lower leg: He exhibits no tenderness.   Edema (trace pitting) present.     Left lower leg: He exhibits no tenderness. Edema (trace pitting) present.  Skin:    General: Skin is warm and dry.  Neurological:     Mental Status: He is alert and oriented to person, place, and time.  Psychiatric:        Behavior: Behavior normal.        Thought Content: Thought content normal.    Assessment & Plan:  1: Chronic heart failure with preserved ejection fraction- - NYHA class II - euvolemic today - weighing daily and he was reminded to call for an overnight weight gain of >2 pounds or  a weekly weight gain of >5 pounds - weight  - not adding salt to his food but says that he's been eating "everything" so figures that he's been getting more salt than he normally does. Reviewed the importance of closely following a 2000mg  sodium diet.    - he is going to start taking the lisinopril on a daily basis instead of sporadically - number to sleep med given to patient again and explained that he needed to call them to get the sleep study scheduled. - BNP 12/18/17 was 842.0  2: HTN- - BP  - he hasn't taken any furosemide - information given to him regarding [facility] and explained that he needed to get established with a PCP there - wife says that they've filled out all the paperwork regarding charity care and sent that in - BMP on 12/19/17 reviewed and showed sodium 139, potassium 3.7, creatinine 1.08 and GFR >60  3: Tobacco use- - smokes 1/2 ppd daily but is trying to quit - complete cessation discussed for 3 minutes with him  4: Substance use- - no cocaine since last visit here but continues to smoke marijuana - drinks 2 beers frequently, sometimes more/ sometimes less; did drink ~ 20 beers one day last week when his back was hurting; reminded that there is sodium in beer - says that he's still tearful and hurt regarding the death of his grandmother ~ 1 month ago   Medication bottle reviewed.

## 2018-05-24 ENCOUNTER — Ambulatory Visit: Payer: Self-pay | Admitting: Family

## 2018-05-24 ENCOUNTER — Telehealth: Payer: Self-pay | Admitting: Family

## 2018-05-24 NOTE — Telephone Encounter (Signed)
Patient did not show for his [facility] appointment on 05/24/2018. Will attempt to reschedule.  

## 2018-06-03 ENCOUNTER — Ambulatory Visit: Payer: Self-pay | Admitting: Family

## 2018-06-07 ENCOUNTER — Ambulatory Visit: Payer: Self-pay | Admitting: Family

## 2018-06-13 NOTE — Progress Notes (Deleted)
Patient ID: Darius [name], male    DOB: 31-Oct-1970, 48 y.o.   MRN: [id number]  HPI  [name] is a 48 y/o male with a history of HTN, HF, current tobacco, alcohol and drug use.   Echo report from 12/19/17 reviewed and showed an EF of 60-65% along with mild MR and mildly elevated PA pressure of 44 mm Hg.   Admitted 12/18/17 due to acute HF. Initially needed IV lasix and then transitioned to oral diuretics. Mildly elevated troponin thought to be due to demand ischemia. Medications started and he was discharged the following day.   He presents today for a follow-up visit with a chief complaint of   Past Medical History:  Diagnosis Date  . CHF (congestive heart failure) (HCC)   . Glaucoma   . Hypertension    No past surgical history on file. No family history on file. Social History   Tobacco Use  . Smoking status: Current Every Day Smoker    Packs/day: 1.00    Types: Cigarettes  . Smokeless tobacco: Never Used  Substance Use Topics  . Alcohol use: Yes    Comment: 32oz beer daily 9/19- has had 3 beers since last visit   Allergies  Allergen Reactions  . Penicillins Other (See Comments)    Patient states that mother is highly allergic. 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  Has patient had a PCN reaction causing immediate rash, facial/tongue/throat swelling, SOB or lightheadedness with hypotension: No Has patient had a PCN reaction causing severe rash involving mucus membranes or skin necrosis: No Has patient had a PCN reaction that required hospitalization Yes Has patient had a PCN reaction occurring within the last 10 years: No If all of the above answers are "N     Review of Systems  Constitutional: Positive for fatigue. Negative for appetite change.  HENT: Negative for congestion, postnasal drip and sore throat.   Eyes: Positive for visual disturbance (left eye due to glaucoma).  Respiratory: Positive for apnea, cough (productive cough), shortness of  breath and wheezing. Negative for chest tightness.        + snoring  Cardiovascular: Positive for leg swelling (right ankle). Negative for chest pain and palpitations.  Gastrointestinal: Negative for abdominal distention and abdominal pain.  Endocrine: Negative.   Genitourinary: Negative.   Musculoskeletal: Positive for arthralgias (right leg pain) and back pain (spasms). Negative for neck pain.  Skin: Negative.   Allergic/Immunologic: Negative.   Neurological: Negative for dizziness and light-headedness.  Hematological: Negative for adenopathy. Does not bruise/bleed easily.  Psychiatric/Behavioral: Positive for sleep disturbance (+ snoring). Negative for dysphoric mood. The patient is not nervous/anxious.      Physical Exam Vitals signs and nursing note reviewed.  Constitutional:      Appearance: He is well-developed.  HENT:     Head: Normocephalic and atraumatic.  Neck:     Musculoskeletal: Normal range of motion and neck supple.     Vascular: No JVD.  Cardiovascular:     Rate and Rhythm: Normal rate and regular rhythm.  Pulmonary:     Effort: Pulmonary effort is normal.     Breath sounds: No wheezing or rales.  Abdominal:     General: There is no distension.     Palpations: Abdomen is soft.     Tenderness: There is no abdominal tenderness.  Musculoskeletal:        General: No tenderness.     Right lower leg: He exhibits no tenderness.  Edema (trace pitting) present.     Left lower leg: He exhibits no tenderness. Edema (trace pitting) present.  Skin:    General: Skin is warm and dry.  Neurological:     Mental Status: He is alert and oriented to person, place, and time.  Psychiatric:        Behavior: Behavior normal.        Thought Content: Thought content normal.    Assessment & Plan:  1: Chronic heart failure with preserved ejection fraction- - NYHA class II - euvolemic today - weighing daily and he was reminded to call for an overnight weight gain of >2 pounds or  a weekly weight gain of >5 pounds - weight  - not adding salt to his food but says that he's been eating "everything" so figures that he's been getting more salt than he normally does. Reviewed the importance of closely following a 2000mg  sodium diet.    - he is going to start taking the lisinopril on a daily basis instead of sporadically - number to sleep med given to patient again and explained that he needed to call them to get the sleep study scheduled. - BNP 12/18/17 was 842.0  2: HTN- - BP  - he hasn't taken any furosemide - information given to him regarding [facility] and explained that he needed to get established with a PCP there - wife says that they've filled out all the paperwork regarding charity care and sent that in - BMP on 12/19/17 reviewed and showed sodium 139, potassium 3.7, creatinine 1.08 and GFR >60  3: Tobacco use- - smokes 1/2 ppd daily but is trying to quit - complete cessation discussed for 3 minutes with him  4: Substance use- - no cocaine since last visit here but continues to smoke marijuana - drinks 2 beers frequently, sometimes more/ sometimes less; did drink ~ 20 beers one day last week when his back was hurting; reminded that there is sodium in beer - says that he's still tearful and hurt regarding the death of his grandmother ~ 1 month ago   Medication bottle reviewed.

## 2018-06-14 ENCOUNTER — Ambulatory Visit: Payer: Self-pay | Admitting: Family

## 2018-06-21 ENCOUNTER — Ambulatory Visit: Payer: Self-pay | Attending: Family | Admitting: Family

## 2018-06-21 ENCOUNTER — Encounter: Payer: Self-pay | Admitting: Pharmacist

## 2018-06-21 ENCOUNTER — Encounter: Payer: Self-pay | Admitting: Family

## 2018-06-21 VITALS — BP 109/57 | HR 65 | Resp 18 | Ht 74.0 in | Wt 160.0 lb

## 2018-06-21 DIAGNOSIS — I5032 Chronic diastolic (congestive) heart failure: Secondary | ICD-10-CM | POA: Insufficient documentation

## 2018-06-21 DIAGNOSIS — I10 Essential (primary) hypertension: Secondary | ICD-10-CM

## 2018-06-21 DIAGNOSIS — F17210 Nicotine dependence, cigarettes, uncomplicated: Secondary | ICD-10-CM | POA: Insufficient documentation

## 2018-06-21 DIAGNOSIS — Z880 Allergy status to penicillin: Secondary | ICD-10-CM | POA: Insufficient documentation

## 2018-06-21 DIAGNOSIS — F1490 Cocaine use, unspecified, uncomplicated: Secondary | ICD-10-CM | POA: Insufficient documentation

## 2018-06-21 DIAGNOSIS — F1990 Other psychoactive substance use, unspecified, uncomplicated: Secondary | ICD-10-CM

## 2018-06-21 DIAGNOSIS — Z720 Tobacco use: Secondary | ICD-10-CM

## 2018-06-21 DIAGNOSIS — I110 Hypertensive heart disease with heart failure: Secondary | ICD-10-CM | POA: Insufficient documentation

## 2018-06-21 DIAGNOSIS — Z79899 Other long term (current) drug therapy: Secondary | ICD-10-CM | POA: Insufficient documentation

## 2018-06-21 NOTE — Progress Notes (Signed)
Patient ID: Darius [name], male    DOB: [birth year], 48 y.o.   MRN: [id number]  HPI  Mr Earlywine is a 48 y/o male with a history of HTN, HF, current tobacco, alcohol and drug use.   Echo report from 12/19/17 reviewed and showed an EF of 60-65% along with mild MR and mildly elevated PA pressure of 44 mm Hg.   Admitted 12/18/17 due to acute HF. Initially needed IV lasix and then transitioned to oral diuretics. Mildly elevated troponin thought to be due to demand ischemia. Medications started and he was discharged the following day.   He presents today for a follow-up visit with a chief complaint of minimal shortness of breath upon moderate exertion. He describes this as chronic in nature having been present for many months. He has associated fatigue, wheezing, difficulty sleeping and slight weight gain along with this. He denies any dizziness, abdominal distention, palpitations, pedal edema, chest pain or cough. Hasn't been taking his lisinopril as he says that he "doesn't like taking pills" but does say that his breathing was better while taking it. Hasn't been weighing himself as he doesn't have any scales.   Past Medical History:  Diagnosis Date  . CHF (congestive heart failure) (HCC)   . Glaucoma   . Hypertension    No past surgical history on file. No family history on file. Social History   Tobacco Use  . Smoking status: Current Every Day Smoker    Packs/day: 1.00    Types: Cigarettes  . Smokeless tobacco: Never Used  Substance Use Topics  . Alcohol use: Yes    Comment: 32oz beer daily 9/19- has had 3 beers since last visit   Allergies  Allergen Reactions  . Penicillins Other (See Comments)    Patient states that mother is highly allergic. 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  Has patient had a PCN reaction causing immediate rash, facial/tongue/throat swelling, SOB or lightheadedness with hypotension: No Has patient had a PCN reaction causing severe rash  involving mucus membranes or skin necrosis: No Has patient had a PCN reaction that required hospitalization Yes Has patient had a PCN reaction occurring within the last 10 years: No If all of the above answers are "N   Prior to Admission medications   Medication Sig Start Date End Date Taking? Authorizing Provider  lisinopril (PRINIVIL,ZESTRIL) 2.5 MG tablet Take 2.5 mg by mouth daily.    [provider]    Review of Systems  Constitutional: Positive for fatigue. Negative for appetite change.  HENT: Negative for congestion, postnasal drip and sore throat.   Eyes: Positive for visual disturbance (left eye due to glaucoma).  Respiratory: Positive for apnea, shortness of breath and wheezing (at times). Negative for cough and chest tightness.        + snoring  Cardiovascular: Negative for chest pain, palpitations and leg swelling.  Gastrointestinal: Negative for abdominal distention and abdominal pain.  Endocrine: Negative.   Genitourinary: Negative.   Musculoskeletal: Negative for arthralgias, back pain and neck pain.  Skin: Negative.   Allergic/Immunologic: Negative.   Neurological: Negative for dizziness and light-headedness.  Hematological: Negative for adenopathy. Does not bruise/bleed easily.  Psychiatric/Behavioral: Positive for sleep disturbance (+ snoring). Negative for dysphoric mood. The patient is not nervous/anxious.    Vitals:   06/21/18 1129  BP: (!) 109/57  Pulse: 65  Resp: 18  SpO2: 99%  Weight: 160 lb (72.6 kg)  Height: 6\' 2"  (1.88 m)  Wt Readings from Last 3 Encounters:  06/21/18 160 lb (72.6 kg)  04/12/18 157 lb 2 oz (71.3 kg)  03/01/18 153 lb 9.6 oz (69.7 kg)   Lab Results  Component Value Date   CREATININE 1.08 12/19/2017   CREATININE 1.17 12/18/2017   CREATININE 1.15 02/15/2017    Physical Exam Vitals signs and nursing note reviewed.  Constitutional:      Appearance: He is well-developed.  HENT:     Head: Normocephalic and atraumatic.   Neck:     Musculoskeletal: Normal range of motion and neck supple.     Vascular: No JVD.  Cardiovascular:     Rate and Rhythm: Normal rate and regular rhythm.  Pulmonary:     Effort: Pulmonary effort is normal.     Breath sounds: No wheezing or rales.  Abdominal:     General: There is no distension.     Palpations: Abdomen is soft.     Tenderness: There is no abdominal tenderness.  Musculoskeletal:        General: No tenderness.     Right lower leg: He exhibits no tenderness. No edema.     Left lower leg: He exhibits no tenderness. No edema.  Skin:    General: Skin is warm and dry.  Neurological:     Mental Status: He is alert and oriented to person, place, and time.  Psychiatric:        Behavior: Behavior normal.        Thought Content: Thought content normal.    Assessment & Plan:  1: Chronic heart failure with preserved ejection fraction- - NYHA class II - euvolemic today - not weighing daily as he doesn't have any scales. Set of scales given to him and he was reminded to call for an overnight weight gain of >2 pounds or a weekly weight gain of >5 pounds - weight up 3 pounds from last visit here 2 months ago - not adding salt to his food but says that his appetite has increased. Reviewed the importance of closely following a 2000mg  sodium diet.    - he will resume lisinopril 2.5mg  daily; will get BMP at his next visit if not done elsewhere - BNP 12/18/17 was 842.0 - PharmD reconciled medications with the patient  2: HTN- - BP looks good  - he hasn't taken any furosemide - information given to him again regarding [facility] and explained that he needed to get established with a PCP there - wife says that they've filled out all the paperwork regarding charity care and sent that in - BMP on 12/19/17 reviewed and showed sodium 139, potassium 3.7, creatinine 1.08 and GFR >60  3: Tobacco use- - smokes 4-5 cigarettes daily but is trying to quit - complete cessation  discussed for 3 minutes with him  4: Substance use- - last cocaine use was 4-5 days ago - continues to smoke marijuana on occasion & last use was ~ 2 weeks ago - now drinking just 1 beer daily; down from close to 20 - says that he's found peace regarding his grandmother's death - congratulated him on decreasing the use of all his substances but emphasized that he needed to stop using cocaine completely; that he was at an increased risk of MI when using cocaine - overall he says that he feels much better   Patient did not bring his medications nor a list. Each medication was verbally reviewed with the patient and he was encouraged to bring the bottles  to every visit to confirm accuracy of list.  Return in 1 month or sooner for any questions/problems before then.

## 2018-06-21 NOTE — Progress Notes (Signed)
[facility] - HEART FAILURE CLINIC - PHARMACIST COUNSELING NOTE  ADHERENCE ASSESSMENT  Adherence strategy: none   Do you ever forget to take your medication? [] Yes (1) [x] No (0)  Do you ever skip doses due to side effects? [] Yes (1) [x] No (0)  Do you have trouble affording your medicines? [] Yes (1) [x] No (0)  Are you ever unable to pick up your medication due to transportation difficulties? [] Yes (1) [x] No (0)  Do you ever stop taking your medications because you don't believe they are helping? [x] Yes (1) [] No (0)  Total score _1______    Recommendations given to patient about increasing adherence: Patient reports not taking his medication because he does not like taking medications. He wanted to see if he would feel better without taking the medication.  Guideline-Directed Medical Therapy/Evidence Based Medicine    ACE/ARB/ARNI: lisinopril 2.5 mg   Beta Blocker: none (HFpEF)   Aldosterone Antagonist: none (HFpEF) Diuretic: none (HFpEF)    SUBJECTIVE   HPI: Here for follow up appointment today. Reports feeling short of breath more lately. Does not have a scale to weigh himself.  Past Medical History:  Diagnosis Date  . CHF (congestive heart failure) (HCC)   . Glaucoma   . Hypertension         OBJECTIVE    Vital signs: HR 65, BP 109/57, weight (pounds) 160  ECHO: Date 12/19/17, EF 60-65%   BMP Latest Ref Rng & Units 12/19/2017 12/18/2017 02/15/2017  Glucose 70 - 99 mg/dL 732(K) 87 92  BUN 6 - 20 mg/dL 02(R) 12 18  Creatinine 0.61 - 1.24 mg/dL 4.27 0.62 3.76  Sodium 135 - 145 mmol/L 139 141 140  Potassium 3.5 - 5.1 mmol/L 3.7 4.2 4.2  Chloride 98 - 111 mmol/L 105 105 103  CO2 22 - 32 mmol/L 28 28 27   Calcium 8.9 - 10.3 mg/dL 9.0 9.3 9.6    ASSESSMENT 48 year old male with HFpEF. He stopped taking his lisinopril because he does not like taking medication and wanted to see how he would feel if he did not take any medications. He reports that he  has been feeling short of breath lately and plans to start taking the lisinopril again. He does not have a scale to weigh himself daily but reports having gained weight between visits.   PLAN Encouraged patient to start taking lisinopril again, which he plans to do. We discussed that this medication can help with HF and slow progression of the disease. A scale was provided to the patient today so that he can start weighing himself daily. Reminded patient to report any weight gain of 2 pounds overnight or 5 pounds in a week. Patient encouraged to continue to exercise and remain active, but to know his limits and take a break if shortness of breath worsens.    Time spent: 10 minutes  Abby K Ellington, Pharm.D. 06/21/2018 12:09 PM    Current Outpatient Medications:  .  lisinopril (PRINIVIL,ZESTRIL) 2.5 MG tablet, Take 2.5 mg by mouth daily., Disp: , Rfl:    COUNSELING POINTS/CLINICAL PEARLS  Lisinopril (Goal: 20 to 40 mg once daily)  This drug may cause nausea, vomiting, dizziness, headache, or angioedema of face, lips, throat, or intestines.  Instruct patient to report signs/symptoms of hypotension, or a persistent cough.  Advise patient against sudden discontinuation of drug.   DRUGS TO AVOID IN HEART FAILURE  Drug or Class Mechanism  Analgesics . NSAIDs . COX-2 inhibitors . Glucocorticoids  Sodium and water  retention, increased systemic vascular resistance, decreased response to diuretics   Diabetes Medications . Metformin . Thiazolidinediones o Rosiglitazone (Avandia) o Pioglitazone (Actos) . DPP4 Inhibitors o Saxagliptin (Onglyza) o Sitagliptin (Januvia)   Lactic acidosis Possible calcium channel blockade   Unknown  Antiarrhythmics . Class I  o Flecainide o Disopyramide . Class III o Sotalol . Other o Dronedarone  Negative inotrope, proarrhythmic   Proarrhythmic, beta blockade  Negative inotrope  Antihypertensives . Alpha  Blockers o Doxazosin . Calcium Channel Blockers o Diltiazem o Verapamil o Nifedipine . Central Alpha Adrenergics o Moxonidine . Peripheral Vasodilators o Minoxidil  Increases renin and aldosterone  Negative inotrope    Possible sympathetic withdrawal  Unknown  Anti-infective . Itraconazole . Amphotericin B  Negative inotrope Unknown  Hematologic . Anagrelide . Cilostazol   Possible inhibition of PD IV Inhibition of PD III causing arrhythmias  Neurologic/Psychiatric . Stimulants . Anti-Seizure Drugs o Carbamazepine o Pregabalin . Antidepressants o Tricyclics o Citalopram . Parkinsons o Bromocriptine o Pergolide o Pramipexole . Antipsychotics o Clozapine . Antimigraine o Ergotamine o Methysergide . Appetite suppressants . Bipolar o Lithium  Peripheral alpha and beta agonist activity  Negative inotrope and chronotrope Calcium channel blockade  Negative inotrope, proarrhythmic Dose-dependent QT prolongation  Excessive serotonin activity/valvular damage Excessive serotonin activity/valvular damage Unknown  IgE mediated hypersensitivy, calcium channel blockade  Excessive serotonin activity/valvular damage Excessive serotonin activity/valvular damage Valvular damage  Direct myofibrillar degeneration, adrenergic stimulation  Antimalarials . Chloroquine . Hydroxychloroquine Intracellular inhibition of lysosomal enzymes  Urologic Agents . Alpha Blockers o Doxazosin o Prazosin o Tamsulosin o Terazosin  Increased renin and aldosterone  Adapted from Page RL, et al. "Drugs That May Cause or Exacerbate Heart Failure: A Scientific Statement from the American Heart  Association." Circulation 2016; 134:e32-e69. DOI: 10.1161/CIR.0000000000000426   MEDICATION ADHERENCES TIPS AND STRATEGIES 1. Taking medication as prescribed improves patient outcomes in heart failure (reduces hospitalizations, improves symptoms, increases survival) 2. Side effects of  medications can be managed by decreasing doses, switching agents, stopping drugs, or adding additional therapy. Please let someone in the Heart Failure Clinic know if you have having bothersome side effects so we can modify your regimen. Do not alter your medication regimen without talking to [place].  3. Medication reminders can help patients remember to take drugs on time. If you are missing or forgetting doses you can try linking behaviors, using pill boxes, or an electronic reminder like an alarm on your phone or an app. Some people can also get automated phone calls as medication reminders.

## 2018-06-21 NOTE — Patient Instructions (Addendum)
Continue weighing daily and call for an overnight weight gain of > 2 pounds or a weekly weight gain of >5 pounds.  [facility]   [phone]  [address]  [place] [address]  Hours: Tuesday: 4:15 pm - 7:30 pm  Wednesday: 9 am - 1 pm  Thursday: 1 pm - 7:30 pm  Friday - Monday: CLOSED 

## 2018-07-19 ENCOUNTER — Ambulatory Visit: Payer: Self-pay | Admitting: Family

## 2018-08-16 ENCOUNTER — Telehealth: Payer: Self-pay

## 2018-08-16 NOTE — Telephone Encounter (Signed)
   TELEPHONE CALL NOTE  This patient has been deemed a candidate for follow-up tele-health visit to limit community exposure during the Covid-19 pandemic. I spoke with the patient via phone to discuss instructions. The patient was advised to review the section on consent for treatment as well. The patient will receive a phone call 2-3 days prior to their E-Visit at which time consent will be verbally confirmed. A Virtual Office Visit appointment type has been scheduled for 08/18/2018 with Clarisa Kindred FNP.  Vinnie Level, CMA 08/16/2018 4:49 PM

## 2018-08-16 NOTE — Telephone Encounter (Signed)
TELEPHONE CALL NOTE  Darius [name] has been deemed a candidate for a follow-up tele-health visit to limit community exposure during the Covid-19 pandemic. I spoke with the patient via phone to ensure availability of phone/video source, confirm preferred email & phone number, discuss instructions and expectations, and review consent.   I reminded Darius [name] to be prepared with any vital sign and/or heart rhythm information that could potentially be obtained via home monitoring, at the time of his visit.  Finally, I reminded Darius [name] to expect an e-mail containing a link for their video-based visit approximately 15 minutes before his visit, or alternatively, a phone call at the time of his visit if his visit is planned to be a phone encounter.  Did the patient verbally consent to treatment as below? Yes   Vinnie Level, CMA 08/16/2018 4:49 PM  CONSENT FOR TELE-HEALTH VISIT - PLEASE REVIEW  I hereby voluntarily request, consent and authorize [facility] and its employed or contracted physicians, physician assistants, nurse practitioners or other licensed health care professionals (the Practitioner), to provide me with telemedicine health care services (the "Services") as deemed necessary by the treating Practitioner. I acknowledge and consent to receive the Services by the Practitioner via telemedicine. I understand that the telemedicine visit will involve communicating with the Practitioner through telephonic communication technology and the disclosure of certain medical information by electronic transmission. I acknowledge that I have been given the opportunity to request an in-person assessment or other available alternative prior to the telemedicine visit and am voluntarily participating in the telemedicine visit.  I understand that I have the right to withhold or withdraw my consent to the use of telemedicine in the course of my care at any time, without affecting my right to  future care or treatment, and that the Practitioner or I may terminate the telemedicine visit at any time. I understand that I have the right to inspect all information obtained and/or recorded in the course of the telemedicine visit and may receive copies of available information for a reasonable fee.  I understand that some of the potential risks of receiving the Services via telemedicine include:  Marland Kitchen Delay or interruption in medical evaluation due to technological equipment failure or disruption; . Information transmitted may not be sufficient (e.g. poor resolution of images) to allow for appropriate medical decision making by the Practitioner; and/or  . In rare instances, security protocols could fail, causing a breach of personal health information.  Furthermore, I acknowledge that it is my responsibility to provide information about my medical history, conditions and care that is complete and accurate to the best of my ability. I acknowledge that Practitioner's advice, recommendations, and/or decision may be based on factors not within their control, such as incomplete or inaccurate data provided by me or lack of visual representation. I understand that the practice of medicine is not an exact science and that Practitioner makes no warranties or guarantees regarding treatment outcomes. I acknowledge that I will receive a copy of this consent concurrently upon execution via email to the email address I last provided but may also request a printed copy by calling the office of [facility].    I understand that my insurance may be billed for this visit.   I have read or had this consent read to me. . I understand the contents of this consent, which adequately explains the benefits and risks of the Services being provided via telemedicine.  . I have  been provided ample opportunity to ask questions regarding this consent and the Services and have had my questions answered to my  satisfaction. . I give my informed consent for the services to be provided through the use of telemedicine in my medical care  By participating in this telemedicine visit I agree to the above.

## 2018-08-18 ENCOUNTER — Ambulatory Visit: Payer: Self-pay | Attending: Family | Admitting: Family

## 2018-08-18 ENCOUNTER — Other Ambulatory Visit: Payer: Self-pay

## 2018-08-18 ENCOUNTER — Encounter: Payer: Self-pay | Admitting: Family

## 2018-08-18 VITALS — Wt 160.0 lb

## 2018-08-18 DIAGNOSIS — I10 Essential (primary) hypertension: Secondary | ICD-10-CM

## 2018-08-18 DIAGNOSIS — F1990 Other psychoactive substance use, unspecified, uncomplicated: Secondary | ICD-10-CM

## 2018-08-18 DIAGNOSIS — I5032 Chronic diastolic (congestive) heart failure: Secondary | ICD-10-CM

## 2018-08-18 MED ORDER — FUROSEMIDE 20 MG PO TABS: 20.0000 mg | ORAL_TABLET | Freq: Every day | ORAL | 3 refills | Status: DC

## 2018-08-18 NOTE — Progress Notes (Signed)
Virtual Visit via Telephone Note    Evaluation Performed:  Follow-up visit  This visit type was conducted due to national recommendations for restrictions regarding the COVID-19 Pandemic (e.g. social distancing).  This format is felt to be most appropriate for this patient at this time.  All issues noted in this document were discussed and addressed.  No physical exam was performed (except for noted visual exam findings with Video Visits).  Please refer to the patient's chart (MyChart message for video visits and phone note for telephone visits) for the patient's consent to telehealth for [facility]  Date:  08/18/2018   ID:  Darius [name], DOB [birth year], MRN [id number]  Patient Location:  [address] [place] [address]   Provider location:   [facility] [address] [place], [place] [address]  PCP:  Delma Freeze, FNP  Cardiologist:  No primary care provider on file.  Electrophysiologist:  None   Chief Complaint:  Shortness of breath  History of Present Illness:    Darius [name] is a 48 y.o. male who presents via audio/video conferencing for a telehealth visit today.  Patient verified DOB and address.  The patient does not have symptoms concerning for COVID-19 infection (fever, chills, cough, or new SHORTNESS OF BREATH).   Patient reports feeling moderately short of breath upon minimal exertion. He says that he's been short of breath for years and it does wax and wane. He does feel like it's become worse slowly over the last few weeks. He has associated fatigue, rhinorrhea, cough, palpitations, dizziness and slight edema around his left ankle. He denies any chest pain, head congestion, fever, difficulty sleeping or weight gain. Admits that he's been feeling depressed over the COVID-19 restrictions as his wife doesn't visit as much as she used to (he lives in a boarding house) and he admits that he still struggles with the death of his grandma. He  has not gotten established with [facility] yet.   Prior CV studies:   The following studies were reviewed today:  Echo report from 12/19/17 reviewed and showed an EF of 60-65% along with mild MR and a mildly elevated PA pressure of 44 mm Hg.   Past Medical History:  Diagnosis Date  . CHF (congestive heart failure) (HCC)   . Glaucoma   . Hypertension    No past surgical history on file.   Current Meds  Medication Sig  . lisinopril (PRINIVIL,ZESTRIL) 2.5 MG tablet Take 2.5 mg by mouth daily.     Allergies:   Penicillins   Social History   Tobacco Use  . Smoking status: Current Every Day Smoker    Packs/day: 1.00    Types: Cigarettes  . Smokeless tobacco: Never Used  Substance Use Topics  . Alcohol use: Yes    Comment: 32oz beer daily 9/19- has had 3 beers since last visit  . Drug use: Yes    Types: Marijuana, Cocaine, Other-see comments, "Crack" cocaine    Comment:  10/2 last use of crack cocaine 9/29 9/19 last use 3-4 weeks ago (marijuana and cocaine)       Family Hx: The patient's family history is not on file.  ROS:   Please see the history of present illness.     All other systems reviewed and are negative.   Labs/Other Tests and Data Reviewed:    Recent Labs: 12/18/2017: B Natriuretic Peptide 842.0; Hemoglobin 15.5; Platelets 168; TSH 0.758 12/19/2017: BUN 24; Creatinine, Ser  1.08; Potassium 3.7; Sodium 139   Recent Lipid Panel No results found for: CHOL, TRIG, HDL, CHOLHDL, LDLCALC, LDLDIRECT  Wt Readings from Last 3 Encounters:  08/18/18 160 lb (72.6 kg)  06/21/18 160 lb (72.6 kg)  04/12/18 157 lb 2 oz (71.3 kg)     Exam:    Vital Signs:  Wt 160 lb (72.6 kg) Comment: self-reported  BMI 20.54 kg/m    Well nourished, well developed male in no  acute distress.   ASSESSMENT & PLAN:    1. Chronic heart failure with preserved ejection fraction- - NYHA class III - euvolemic based on patient's description of symptoms - weighing daily although  he's had his scales on carpet; instructed him to make sure the scale is on a flat surface and reminded him to call for an overnight weight gain of >2 pounds or a weekly weight gain of >5 pounds - not adding salt and is trying to follow a low sodium diet - will resume furosemide 20mg  daily due to his shortness of breath  2: HTN- - does not check his BP at home - hasn't gotten established with [facility] yet and the phone number was given, again, to his wife and emphasized that they needed to call and get connected with them. Will also mail them the phone number/ address - emotional support given to him regarding his depression as it relates to his grandma's death - BMP January 02, 2018 reviewed and showed sodium 139, potassium 3.7, creatinine 1.08 and GFR >60 - will need to get updated BMP at his next visit if possible  3: Substance abuse- - patient has smoked 2 packs of cigarettes in the last 2 days because he "had them"; says that he normally just buys singles and smokes 4 cigarettes / day - drank an entire bottle of [employer] last night to celebrate his birthday today - last used cocaine ~ 4 days ago; emphasized the need to stop using cocaine completely  COVID-19 Education: The signs and symptoms of COVID-19 were discussed with the patient and how to seek care for testing (follow up with PCP or arrange E-visit).  The importance of social distancing was discussed today.  Patient Risk:   After full review of this patients clinical status, I feel that they are at least moderate risk at this time.  Time:   Today, I have spent 16 minutes with the patient with telehealth technology discussing medications, symptoms, weights and substance use.   Medication Adjustments/Labs and Tests Ordered: Current medicines are reviewed at length with the patient today.  Concerns regarding medicines are outlined above.   Tests Ordered: No orders of the defined types were placed in this  encounter.  Medication Changes: Meds ordered this encounter  Medications  . furosemide (LASIX) 20 MG tablet    Sig: Take 1 tablet (20 mg total) by mouth daily.    Dispense:  90 tablet    Refill:  3    Disposition:  Follow-up in 1 month or sooner for any questions/problems before then.   Signed, Delma Freeze, FNP  08/18/2018 11:46 AM    [facility]

## 2018-08-18 NOTE — Patient Instructions (Signed)
Continue weighing daily and call for an overnight weight gain of > 2 pounds or a weekly weight gain of >5 pounds.  Make sure your scale is on a hard surface.    [facility]   [phone]  [address] [place] [address]  Hours: Tuesday: 4:15 pm - 7:30 pm  Wednesday: 9 am - 1 pm  Thursday: 1 pm - 7:30 pm  Friday - Monday: CLOSED

## 2018-09-13 ENCOUNTER — Telehealth: Payer: Self-pay

## 2018-09-13 NOTE — Telephone Encounter (Signed)
   TELEPHONE CALL NOTE  This patient has been deemed a candidate for follow-up tele-health visit to limit community exposure during the Covid-19 pandemic. I spoke with the patient via phone to discuss instructions. The patient was advised to review the section on consent for treatment as well. The patient will receive a phone call 2-3 days prior to their E-Visit at which time consent will be verbally confirmed. A Virtual Office Visit appointment type has been scheduled for 09/15/2018 with [facility].  Vinnie Level, CMA 09/13/2018 1:13 PM

## 2018-09-13 NOTE — Telephone Encounter (Signed)
TELEPHONE CALL NOTE  Darius [name] has been deemed a candidate for a follow-up tele-health visit to limit community exposure during the Covid-19 pandemic. I spoke with the patient via phone to ensure availability of phone/video source, confirm preferred email & phone number, discuss instructions and expectations, and review consent.   I reminded Darius [name] to be prepared with any vital sign and/or heart rhythm information that could potentially be obtained via home monitoring, at the time of his visit.  Finally, I reminded Darius [name] to expect an e-mail containing a link for their video-based visit approximately 15 minutes before his visit, or alternatively, a phone call at the time of his visit if his visit is planned to be a phone encounter.  Did the patient verbally consent to treatment as below? YES  Darius [name], CMA 09/13/2018 1:13 PM  CONSENT FOR TELE-HEALTH VISIT - PLEASE REVIEW  I hereby voluntarily request, consent and authorize [facility] and its employed or contracted physicians, physician assistants, nurse practitioners or other licensed health care professionals (the Practitioner), to provide me with telemedicine health care services (the "Services") as deemed necessary by the treating Practitioner. I acknowledge and consent to receive the Services by the Practitioner via telemedicine. I understand that the telemedicine visit will involve communicating with the Practitioner through telephonic communication technology and the disclosure of certain medical information by electronic transmission. I acknowledge that I have been given the opportunity to request an in-person assessment or other available alternative prior to the telemedicine visit and am voluntarily participating in the telemedicine visit.  I understand that I have the right to withhold or withdraw my consent to the use of telemedicine in the course of my care at any time, without affecting my right to  future care or treatment, and that the Practitioner or I may terminate the telemedicine visit at any time. I understand that I have the right to inspect all information obtained and/or recorded in the course of the telemedicine visit and may receive copies of available information for a reasonable fee.  I understand that some of the potential risks of receiving the Services via telemedicine include:  Marland Kitchen Delay or interruption in medical evaluation due to technological equipment failure or disruption; . Information transmitted may not be sufficient (e.g. poor resolution of images) to allow for appropriate medical decision making by the Practitioner; and/or  . In rare instances, security protocols could fail, causing a breach of personal health information.  Furthermore, I acknowledge that it is my responsibility to provide information about my medical history, conditions and care that is complete and accurate to the best of my ability. I acknowledge that Practitioner's advice, recommendations, and/or decision may be based on factors not within their control, such as incomplete or inaccurate data provided by me or lack of visual representation. I understand that the practice of medicine is not an exact science and that Practitioner makes no warranties or guarantees regarding treatment outcomes. I acknowledge that I will receive a copy of this consent concurrently upon execution via email to the email address I last provided but may also request a printed copy by calling the office of [facility].    I understand that my insurance may be billed for this visit.   I have read or had this consent read to me. . I understand the contents of this consent, which adequately explains the benefits and risks of the Services being provided via telemedicine.  . I have been  provided ample opportunity to ask questions regarding this consent and the Services and have had my questions answered to my  satisfaction. . I give my informed consent for the services to be provided through the use of telemedicine in my medical care  By participating in this telemedicine visit I agree to the above.

## 2018-09-15 ENCOUNTER — Ambulatory Visit: Payer: Self-pay | Attending: Family | Admitting: Family

## 2018-09-15 ENCOUNTER — Other Ambulatory Visit: Payer: Self-pay

## 2018-09-15 ENCOUNTER — Encounter: Payer: Self-pay | Admitting: Family

## 2018-09-15 VITALS — Wt 156.0 lb

## 2018-09-15 DIAGNOSIS — F1990 Other psychoactive substance use, unspecified, uncomplicated: Secondary | ICD-10-CM

## 2018-09-15 DIAGNOSIS — I5032 Chronic diastolic (congestive) heart failure: Secondary | ICD-10-CM

## 2018-09-15 DIAGNOSIS — I10 Essential (primary) hypertension: Secondary | ICD-10-CM

## 2018-09-15 NOTE — Progress Notes (Addendum)
Virtual Visit via Telephone Note   Evaluation Performed:  Follow-up visit  This visit type was conducted due to national recommendations for restrictions regarding the COVID-19 Pandemic (e.g. social distancing).  This format is felt to be most appropriate for this patient at this time.  All issues noted in this document were discussed and addressed.  No physical exam was performed (except for noted visual exam findings with Video Visits).  Please refer to the patient's chart (MyChart message for video visits and phone note for telephone visits) for the patient's consent to telehealth for [facility]  Date:  09/15/2018   ID:  Darius [name], DOB 1970/06/29, MRN [id number]  Patient Location:  [address] [place] [address]   Provider location:   [facility] [address] [place], [place] [address]  PCP:  Delma Freeze, FNP  Cardiologist:  No primary care provider on file.  Electrophysiologist:  None   Chief Complaint:  Shortness of breath  History of Present Illness:    Darius [name] is a 48 y.o. male who presents via audio/video conferencing for a telehealth visit today.  Patient verified DOB and address.  The patient does not have symptoms concerning for COVID-19 infection (fever, chills, cough, or new SHORTNESS OF BREATH).   Patient reports minimal shortness of breath upon moderate exertion. He describes this as chronic in nature having been present for several years. He has associated fatigue along with this. He denies any dizziness, swelling in his legs/ abdomen, palpitations, chest pain, difficulty sleeping or weight gain.   Prior CV studies:   The following studies were reviewed today:  Echo report from 12/19/17 reviewed and showed an EF of 60-65% along with mild MR and elevated PA pressure of 44 mm Hg.   Past Medical History:  Diagnosis Date  . CHF (congestive heart failure) (HCC)   . Glaucoma   . Hypertension    No past surgical  history on file.   Current Meds  Medication Sig  . lisinopril (PRINIVIL,ZESTRIL) 2.5 MG tablet Take 2.5 mg by mouth daily.     Allergies:   Penicillins   Social History   Tobacco Use  . Smoking status: Current Every Day Smoker    Packs/day: 1.00    Types: Cigarettes  . Smokeless tobacco: Never Used  Substance Use Topics  . Alcohol use: Yes    Comment: 32oz beer daily 9/19- has had 3 beers since last visit  . Drug use: Yes    Types: Marijuana, Cocaine, Other-see comments, "Crack" cocaine    Comment:  10/2 last use of crack cocaine 9/29 9/19 last use 3-4 weeks ago (marijuana and cocaine)       Family Hx: The patient's family history is not on file.  ROS:   Please see the history of present illness.     All other systems reviewed and are negative.   Labs/Other Tests and Data Reviewed:    Recent Labs: 12/18/2017: B Natriuretic Peptide 842.0; Hemoglobin 15.5; Platelets 168; TSH 0.758 12/19/2017: BUN 24; Creatinine, Ser 1.08; Potassium 3.7; Sodium 139   Recent Lipid Panel No results found for: CHOL, TRIG, HDL, CHOLHDL, LDLCALC, LDLDIRECT  Wt Readings from Last 3 Encounters:  09/15/18 156 lb (70.8 kg)  08/18/18 160 lb (72.6 kg)  06/21/18 160 lb (72.6 kg)     Exam:    Vital Signs:  Wt 156 lb (70.8 kg) Comment: self-reported  BMI 20.03 kg/m    Well nourished, well developed  male in no  acute distress.   ASSESSMENT & PLAN:    1.Chronic heart failure with preserved ejection fraction- - NYHA class II - euvolemic based on patient's description of symptoms - weighing daily and he says that his weight ranges from 156-160 pounds; reminded him to call for an overnight weight gain of >2 pounds or a weekly weight gain of >5 pounds - not adding salt and is trying to follow a low sodium diet - only taking his furosemide as needed and says that he hasn't taken it recently  2: HTN- - does not check his BP at home - hasn't gotten established with [facility] yet  -  BMP 12/19/17 reviewed and showed sodium 139, potassium 3.7, creatinine 1.08 and GFR >60 - will need to get updated BMP at his next visit if possible  3: Substance abuse- - patient is now smoking 3 cigarettes daily - last used cocaine ~ 2 weeks ago; encouraged continue cessation - currently using marijuana  4: Snoring- - wife notices loud snoring as well as that he stops breathing during the night - will refer for sleep study to rule out sleep apnea  COVID-19 Education: The signs and symptoms of COVID-19 were discussed with the patient and how to seek care for testing (follow up with PCP or arrange E-visit).  The importance of social distancing was discussed today.  Patient Risk:   After full review of this patients clinical status, I feel that they are at least moderate risk at this time.  Time:   Today, I have spent 12 minutes with the patient with telehealth technology discussing medications, diet and symptoms to report.    Medication Adjustments/Labs and Tests Ordered: Current medicines are reviewed at length with the patient today.  Concerns regarding medicines are outlined above.   Tests Ordered: No orders of the defined types were placed in this encounter.  Medication Changes: No orders of the defined types were placed in this encounter.   Disposition: Follow-up in 6 weeks or sooner for any questions/problems before then.   Signed, Delma Freeze, FNP  09/15/2018 11:17 AM    [facility]

## 2018-09-15 NOTE — Patient Instructions (Signed)
Continue weighing daily and call for an overnight weight gain of > 2 pounds or a weekly weight gain of >5 pounds. 

## 2018-09-16 NOTE — Progress Notes (Deleted)
4: Snoring- - patient reports loud snoring - wife notices that he stops breathing during the night - will refer for sleep study to rule out sleep apnea

## 2018-10-23 ENCOUNTER — Emergency Department: Payer: Self-pay

## 2018-10-23 ENCOUNTER — Other Ambulatory Visit: Payer: Self-pay

## 2018-10-23 ENCOUNTER — Emergency Department
Admission: EM | Admit: 2018-10-23 | Discharge: 2018-10-23 | Disposition: A | Discharge status: Home / Self Care | Payer: Self-pay | Attending: Emergency Medicine | Admitting: Emergency Medicine

## 2018-10-23 DIAGNOSIS — W2209XA Striking against other stationary object, initial encounter: Secondary | ICD-10-CM | POA: Insufficient documentation

## 2018-10-23 DIAGNOSIS — Z79899 Other long term (current) drug therapy: Secondary | ICD-10-CM | POA: Insufficient documentation

## 2018-10-23 DIAGNOSIS — Y9389 Activity, other specified: Secondary | ICD-10-CM | POA: Insufficient documentation

## 2018-10-23 DIAGNOSIS — I509 Heart failure, unspecified: Secondary | ICD-10-CM | POA: Insufficient documentation

## 2018-10-23 DIAGNOSIS — Y999 Unspecified external cause status: Secondary | ICD-10-CM | POA: Insufficient documentation

## 2018-10-23 DIAGNOSIS — I110 Hypertensive heart disease with heart failure: Secondary | ICD-10-CM | POA: Insufficient documentation

## 2018-10-23 DIAGNOSIS — F17210 Nicotine dependence, cigarettes, uncomplicated: Secondary | ICD-10-CM | POA: Insufficient documentation

## 2018-10-23 DIAGNOSIS — Y9289 Other specified places as the place of occurrence of the external cause: Secondary | ICD-10-CM | POA: Insufficient documentation

## 2018-10-23 DIAGNOSIS — S62306A Unspecified fracture of fifth metacarpal bone, right hand, initial encounter for closed fracture: Secondary | ICD-10-CM | POA: Insufficient documentation

## 2018-10-23 DIAGNOSIS — S62339A Displaced fracture of neck of unspecified metacarpal bone, initial encounter for closed fracture: Secondary | ICD-10-CM

## 2018-10-23 MED ORDER — AZITHROMYCIN 250 MG PO TABS: ORAL_TABLET | ORAL | 0 refills | Status: DC

## 2018-10-23 MED ORDER — IBUPROFEN 600 MG PO TABS: 600.0000 mg | ORAL_TABLET | Freq: Three times a day (TID) | ORAL | 0 refills | Status: DC | PRN

## 2018-10-23 NOTE — ED Triage Notes (Signed)
Pt arrived via POV with reports of punching wall with right hand, pt states he woke up today with increased pain and swelling.

## 2018-10-23 NOTE — Discharge Instructions (Signed)
Call make an appointment for follow-up with Dr. Roland Rack who is the orthopedist on call today.  Ice and elevation which will help with pain and reduce swelling.  Wear splint for protection of your fracture.  Begin taking the Zithromax to prevent skin infection.  Ibuprofen is every 8 hours for inflammation and pain.

## 2018-10-23 NOTE — ED Provider Notes (Signed)
[facility] Emergency Department Provider Note  ____________________________________________   First MD Initiated Contact with Patient 10/23/18 0932     (approximate)  I have reviewed the triage vital signs and the nursing notes.   HISTORY  Chief Complaint Hand Injury   HPI Darius [name] is a 48 y.o. male presents to the ED with complaint of right hand pain.  Patient states that last evening he got annoyed with neighbors who playing music too loud.  He states that he asked them to turn the music down but they continued.  Patient states that he hit the wooden door with his fist.  Patient is right-hand dominant.  He denies any human bite or assault.  He states that the abrasion on the dorsal aspect of his hand is from the wooden door.  Increased pain and swelling this morning.  He rates his pain as 7 out of 10.       Past Medical History:  Diagnosis Date  . CHF (congestive heart failure) (HCC)   . Glaucoma   . Hypertension     Patient Active Problem List   Diagnosis Date Noted  . HTN (hypertension) 12/30/2017  . Tobacco use 12/30/2017  . Substance use disorder 12/30/2017  . CHF (congestive heart failure) (HCC) 12/18/2017    No past surgical history on file.  Prior to Admission medications   Medication Sig Start Date End Date Taking? Authorizing Provider  azithromycin (ZITHROMAX Z-PAK) 250 MG tablet Take 2 tablets (500 mg) on  Day 1,  followed by 1 tablet (250 mg) once daily on Days 2 through 5. 10/23/18   Tommi Rumps, PA-C  furosemide (LASIX) 20 MG tablet Take 1 tablet (20 mg total) by mouth daily. Patient not taking: Reported on 09/15/2018 08/18/18 11/16/18  Delma Freeze, FNP  ibuprofen (ADVIL) 600 MG tablet Take 1 tablet (600 mg total) by mouth every 8 (eight) hours as needed. 10/23/18   Tommi Rumps, PA-C  lisinopril (PRINIVIL,ZESTRIL) 2.5 MG tablet Take 2.5 mg by mouth daily.    [provider]    Allergies Penicillins  No  family history on file.  Social History Social History   Tobacco Use  . Smoking status: Current Every Day Smoker    Packs/day: 1.00    Types: Cigarettes  . Smokeless tobacco: Never Used  Substance Use Topics  . Alcohol use: Yes    Comment: 32oz beer daily 9/19- has had 3 beers since last visit  . Drug use: Yes    Types: Marijuana, Cocaine, Other-see comments, "Crack" cocaine    Comment:  10/2 last use of crack cocaine 9/29 9/19 last use 3-4 weeks ago (marijuana and cocaine)      Review of Systems Constitutional: No fever/chills Cardiovascular: Denies chest pain. Respiratory: Denies shortness of breath. Musculoskeletal: Positive right hand pain. Skin: Negative for rash. Neurological: Negative for headaches, focal weakness or numbness. ___________________________________________   PHYSICAL EXAM:  VITAL SIGNS: ED Triage Vitals  Enc Vitals Group     BP 10/23/18 0915 106/82     Pulse Rate 10/23/18 0915 64     Resp 10/23/18 0915 18     Temp 10/23/18 0915 97.8 F (36.6 C)     Temp Source 10/23/18 0915 Oral     SpO2 10/23/18 0915 96 %     Weight 10/23/18 0917 165 lb (74.8 kg)     Height 10/23/18 0917 6\' 2"  (1.88 m)     Head Circumference --  Peak Flow --      Pain Score 10/23/18 0915 7     Pain Loc --      Pain Edu? --      Excl. in GC? --    Constitutional: Alert and oriented. Well appearing and in no acute distress. Eyes: Conjunctivae are normal. PERRL. EOMI. Head: Atraumatic. Neck: No stridor.   Cardiovascular: Normal rate, regular rhythm. Grossly normal heart sounds.  Good peripheral circulation. Respiratory: Normal respiratory effort.  No retractions. Lungs CTAB. Musculoskeletal: Examination of the right hand there is soft tissue edema noted over the fifth metacarpal area.  There is a superficial abrasion noted to the dorsal aspect in the same area without any evidence of foreign body or bleeding.  Patient is able to flex and extend the fifth digit but limited  secondary to pain.  Capillary refills less than 3 seconds.  Motor sensory function intact. Neurologic:  Normal speech and language. No gross focal neurologic deficits are appreciated. No gait instability. Skin:  Skin is warm, dry.  Abrasion to the right fifth metacarpal dorsal aspect as noted above. Psychiatric: Mood and affect are normal. Speech and behavior are normal.  ____________________________________________   LABS (all labs ordered are listed, but only abnormal results are displayed)  Labs Reviewed - No data to display  RADIOLOGY  ED MD interpretation:   Right hand x-ray is positive for boxer's fracture.  Official radiology report(s): Dg Hand Complete Right  Result Date: 10/23/2018 CLINICAL DATA:  Pain after punching wall EXAM: RIGHT HAND - COMPLETE 3+ VIEW COMPARISON:  None. FINDINGS: Frontal, oblique, and lateral views obtained. There is a comminuted fracture of the distal fifth metacarpal with overall alignment near anatomic. There is soft tissue swelling over the fifth MCP joint. No other fractures are evident. No dislocation. Joint spaces appear normal. No erosive change. IMPRESSION: Comminuted fracture distal fifth metacarpal with alignment near anatomic. No other fracture. No dislocation. There is soft tissue swelling in the area of fracture medially. No appreciable underlying arthropathic change. Electronically Signed   By: Bretta BangWilliam  Woodruff III M.D.   On: 10/23/2018 09:50    ____________________________________________   PROCEDURES  Procedure(s) performed (including Critical Care):  Procedures OCL gutter splint was applied by the nurse.  ____________________________________________   INITIAL IMPRESSION / ASSESSMENT AND PLAN / ED COURSE  As part of my medical decision making, I reviewed the following data within the electronic MEDICAL RECORD NUMBER Notes from prior ED visits and [place] Controlled Substance Database  48 year old male presents to the ED with complaint of  right hand pain after he punched a door last evening after getting upset when neighbors would not turn the music down.  Patient denies any assault and the abrasion on his hand is not a human bite.  There is moderate edema noted to the fifth metacarpal area distally and is suspicious for boxer's fracture.  X-ray of the right hand confirmed the fracture and patient was made aware.  He was placed in a OCL gutter splint and given instructions to follow-up with Dr. Joice LoftsPoggi.  Ice and elevation.  Ibuprofen every 8 hours for pain and inflammation.  He was also given a prescription for a Z-Pak to prevent skin infection.  ____________________________________________   FINAL CLINICAL IMPRESSION(S) / ED DIAGNOSES  Final diagnoses:  Closed boxer's fracture, initial encounter     ED Discharge Orders         Ordered    ibuprofen (ADVIL) 600 MG tablet  Every 8 hours PRN,  Status:  Discontinued     10/23/18 1002    ibuprofen (ADVIL) 600 MG tablet  Every 8 hours PRN     10/23/18 1002    azithromycin (ZITHROMAX Z-PAK) 250 MG tablet     10/23/18 1004           Note:  This document was prepared using Dragon voice recognition software and may include unintentional dictation errors.    Johnn Hai, PA-C 10/23/18 1010    Delman Kitten, MD 10/23/18 [phone]

## 2018-10-27 ENCOUNTER — Ambulatory Visit: Payer: Self-pay | Admitting: Family

## 2018-10-27 ENCOUNTER — Telehealth: Payer: Self-pay | Admitting: Family

## 2018-10-27 NOTE — Telephone Encounter (Signed)
Patient did not show for his [facility] appointment on 10/27/2018. Will attempt to reschedule.

## 2018-10-27 NOTE — Progress Notes (Deleted)
Patient ID: Darius [name], male    DOB: [birth year], 48 y.o.   MRN: [id number]  HPI  Darius [name] is a 48 y/o male with a history of HTN, HF, current tobacco, alcohol and drug use.   Echo report from 12/19/17 reviewed and showed an EF of 60-65% along with mild Darius and mildly elevated PA pressure of 44 mm Hg.   Was in the ED 10/22/2018 due to boxer's fracture after hitting a door with his hand. Patient to follow-up with orthopaedics and he was released.   He presents today for a follow-up visit with a chief complaint of    Past Medical History:  Diagnosis Date  . CHF (congestive heart failure) ([place])   . Glaucoma   . Hypertension    No past surgical history on file. No family history on file. Social History   Tobacco Use  . Smoking status: Current Every Day Smoker    Packs/day: 1.00    Types: Cigarettes  . Smokeless tobacco: Never Used  Substance Use Topics  . Alcohol use: Yes    Comment: 32oz beer daily 9/19- has had 3 beers since last visit   Allergies  Allergen Reactions  . Penicillins Other (See Comments)    Patient states that mother is highly allergic. 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  Has patient had a PCN reaction causing immediate rash, facial/tongue/throat swelling, SOB or lightheadedness with hypotension: No Has patient had a PCN reaction causing severe rash involving mucus membranes or skin necrosis: No Has patient had a PCN reaction that required hospitalization Yes Has patient had a PCN reaction occurring within the last 10 years: No If all of the above answers are "N     Review of Systems  Constitutional: Positive for fatigue. Negative for appetite change.  HENT: Negative for congestion, postnasal drip and sore throat.   Eyes: Positive for visual disturbance (left eye due to glaucoma).  Respiratory: Positive for apnea, shortness of breath and wheezing (at times). Negative for cough and chest tightness.        + snoring  Cardiovascular:  Negative for chest pain, palpitations and leg swelling.  Gastrointestinal: Negative for abdominal distention and abdominal pain.  Endocrine: Negative.   Genitourinary: Negative.   Musculoskeletal: Negative for arthralgias, back pain and neck pain.  Skin: Negative.   Allergic/Immunologic: Negative.   Neurological: Negative for dizziness and light-headedness.  Hematological: Negative for adenopathy. Does not bruise/bleed easily.  Psychiatric/Behavioral: Positive for sleep disturbance (+ snoring). Negative for dysphoric mood. The patient is not nervous/anxious.      Physical Exam Vitals signs and nursing note reviewed.  Constitutional:      Appearance: He is well-developed.  HENT:     Head: Normocephalic and atraumatic.  Neck:     Musculoskeletal: Normal range of motion and neck supple.     Vascular: No JVD.  Cardiovascular:     Rate and Rhythm: Normal rate and regular rhythm.  Pulmonary:     Effort: Pulmonary effort is normal.     Breath sounds: No wheezing or rales.  Abdominal:     General: There is no distension.     Palpations: Abdomen is soft.     Tenderness: There is no abdominal tenderness.  Musculoskeletal:        General: No tenderness.     Right lower leg: He exhibits no tenderness. No edema.     Left lower leg: He exhibits no tenderness. No edema.  Skin:  General: Skin is warm and dry.  Neurological:     Mental Status: He is alert and oriented to person, place, and time.  Psychiatric:        Behavior: Behavior normal.        Thought Content: Thought content normal.    Assessment & Plan:  1: Chronic heart failure with preserved ejection fraction- - NYHA class II - euvolemic today - not weighing daily as he doesn't have any scales. Set of scales given to him and he was reminded to call for an overnight weight gain of >2 pounds or a weekly weight gain of >5 pounds - weight 160  from last visit here 4 months ago - not adding salt to his food but says that his  appetite has increased. Reviewed the importance of closely following a 2000mg  sodium diet.    - will get BMP today as lisinopril was resumed at his last in person visit - BNP 12/18/17 was 842.0   2: HTN- - BP - he hasn't taken any furosemide - information given to him again regarding [facility] and explained that he needed to get established with a PCP there - wife says that they've filled out all the paperwork regarding charity care and sent that in - BMP on 12/19/17 reviewed and showed sodium 139, potassium 3.7, creatinine 1.08 and GFR >60  3: Tobacco use- - smokes 4-5 cigarettes daily but is trying to quit - complete cessation discussed for 3 minutes with him  4: Substance use- - last cocaine use was 4-5 days ago - continues to smoke marijuana on occasion & last use was ~ 2 weeks ago - now drinking just 1 beer daily; down from close to 20 - says that he's found peace regarding his grandmother's death - congratulated him on decreasing the use of all his substances but emphasized that he needed to stop using cocaine completely; that he was at an increased risk of MI when using cocaine - overall he says that he feels much better   Patient did not bring his medications nor a list. Each medication was verbally reviewed with the patient and he was encouraged to bring the bottles to every visit to confirm accuracy of list.

## 2018-12-01 ENCOUNTER — Other Ambulatory Visit: Payer: Self-pay

## 2018-12-01 ENCOUNTER — Encounter: Payer: Self-pay | Admitting: Family

## 2018-12-01 ENCOUNTER — Ambulatory Visit: Payer: Self-pay | Attending: Family | Admitting: Family

## 2018-12-01 VITALS — BP 108/64 | HR 66 | Resp 18 | Ht 74.0 in | Wt 151.1 lb

## 2018-12-01 DIAGNOSIS — Z880 Allergy status to penicillin: Secondary | ICD-10-CM | POA: Insufficient documentation

## 2018-12-01 DIAGNOSIS — I10 Essential (primary) hypertension: Secondary | ICD-10-CM

## 2018-12-01 DIAGNOSIS — Z720 Tobacco use: Secondary | ICD-10-CM

## 2018-12-01 DIAGNOSIS — Z79899 Other long term (current) drug therapy: Secondary | ICD-10-CM | POA: Insufficient documentation

## 2018-12-01 DIAGNOSIS — F1990 Other psychoactive substance use, unspecified, uncomplicated: Secondary | ICD-10-CM

## 2018-12-01 DIAGNOSIS — I5032 Chronic diastolic (congestive) heart failure: Secondary | ICD-10-CM

## 2018-12-01 DIAGNOSIS — F17210 Nicotine dependence, cigarettes, uncomplicated: Secondary | ICD-10-CM | POA: Insufficient documentation

## 2018-12-01 DIAGNOSIS — I5030 Unspecified diastolic (congestive) heart failure: Secondary | ICD-10-CM | POA: Insufficient documentation

## 2018-12-01 DIAGNOSIS — H409 Unspecified glaucoma: Secondary | ICD-10-CM | POA: Insufficient documentation

## 2018-12-01 DIAGNOSIS — I110 Hypertensive heart disease with heart failure: Secondary | ICD-10-CM | POA: Insufficient documentation

## 2018-12-01 MED ORDER — LISINOPRIL 2.5 MG PO TABS: 2.5000 mg | ORAL_TABLET | Freq: Every day | ORAL | 3 refills | Status: DC

## 2018-12-01 NOTE — Progress Notes (Signed)
Patient ID: Darius [name], male    DOB: Feb 08, 1971, 48 y.o.   MRN: [id number]  HPI  Darius [name] is a 48 y/o male with a history of HTN, HF, current tobacco, alcohol and drug use.   Echo report from 12/19/17 reviewed and showed an EF of 60-65% along with mild Darius and mildly elevated PA pressure of 44 mm Hg.   Admitted 10/23/2018 due to closed boxer's fracture where he was treated and released.    He presents today for a follow-up visit with a chief complaint of minimal shortness of breath upon moderate exertion. He describes this as chronic in nature having been present for several years. He does feel like his breathing has worsened over the last few days as he's been out of lisinopril. He has associated fatigue, apnea, wheezing, pedal edema and difficulty sleeping along with this. He denies any abdominal distention, palpitations, chest pain, cough, dizziness or weight gain.   Past Medical History:  Diagnosis Date  . CHF (congestive heart failure) (HCC)   . Glaucoma   . Hypertension    No past surgical history on file. No family history on file. Social History   Tobacco Use  . Smoking status: Current Every Day Smoker    Packs/day: 1.00    Types: Cigarettes  . Smokeless tobacco: Never Used  Substance Use Topics  . Alcohol use: Yes    Comment: 32oz beer daily 9/19- has had 3 beers since last visit   Allergies  Allergen Reactions  . Penicillins Other (See Comments)    Patient states that mother is highly allergic. 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  Has patient had a PCN reaction causing immediate rash, facial/tongue/throat swelling, SOB or lightheadedness with hypotension: No Has patient had a PCN reaction causing severe rash involving mucus membranes or skin necrosis: No Has patient had a PCN reaction that required hospitalization Yes Has patient had a PCN reaction occurring within the last 10 years: No If all of the above answers are "N   Prior to Admission  medications   Medication Sig Start Date End Date Taking? Authorizing Provider  furosemide (LASIX) 20 MG tablet Take 1 tablet (20 mg total) by mouth daily. 08/18/18 12/01/18 Yes Paislee Szatkowski, Inetta Fermo A, FNP  ibuprofen (ADVIL) 600 MG tablet Take 1 tablet (600 mg total) by mouth every 8 (eight) hours as needed. Patient not taking: Reported on 12/01/2018 10/23/18   Tommi Rumps, PA-C  lisinopril (PRINIVIL,ZESTRIL) 2.5 MG tablet Take 2.5 mg by mouth daily.    [provider]     Review of Systems  Constitutional: Positive for fatigue. Negative for appetite change.  HENT: Negative for congestion, postnasal drip and sore throat.   Eyes: Positive for visual disturbance (left eye due to glaucoma).  Respiratory: Positive for apnea, shortness of breath (with no lisinopril) and wheezing (at times). Negative for cough and chest tightness.        + snoring  Cardiovascular: Positive for leg swelling (at the end of the day). Negative for chest pain and palpitations.  Gastrointestinal: Negative for abdominal distention and abdominal pain.  Endocrine: Negative.   Genitourinary: Negative.   Musculoskeletal: Negative for arthralgias, back pain and neck pain.  Skin: Negative.   Allergic/Immunologic: Negative.   Neurological: Negative for dizziness and light-headedness.  Hematological: Negative for adenopathy. Does not bruise/bleed easily.  Psychiatric/Behavioral: Positive for sleep disturbance (+ snoring). Negative for dysphoric mood. The patient is not nervous/anxious.    Vitals:  12/01/18 1318  BP: 108/64  Pulse: 66  Resp: 18  SpO2: 99%  Weight: 151 lb 2 oz (68.5 kg)  Height: 6\' 2"  (1.88 m)   Wt Readings from Last 3 Encounters:  12/01/18 151 lb 2 oz (68.5 kg)  10/23/18 165 lb (74.8 kg)  09/15/18 156 lb (70.8 kg)   Lab Results  Component Value Date   CREATININE 1.08 12/19/2017   CREATININE 1.17 12/18/2017   CREATININE 1.15 02/15/2017    Physical Exam Vitals signs and nursing note  reviewed.  Constitutional:      Appearance: He is well-developed.  HENT:     Head: Normocephalic and atraumatic.  Neck:     Musculoskeletal: Normal range of motion and neck supple.     Vascular: No JVD.  Cardiovascular:     Rate and Rhythm: Normal rate and regular rhythm.  Pulmonary:     Effort: Pulmonary effort is normal.     Breath sounds: No wheezing or rales.  Abdominal:     General: There is no distension.     Palpations: Abdomen is soft.     Tenderness: There is no abdominal tenderness.  Musculoskeletal:        General: No tenderness.     Right lower leg: He exhibits no tenderness. No edema.     Left lower leg: He exhibits no tenderness. No edema.  Skin:    General: Skin is warm and dry.  Neurological:     Mental Status: He is alert and oriented to person, place, and time.  Psychiatric:        Behavior: Behavior normal.        Thought Content: Thought content normal.    Assessment & Plan:  1: Chronic heart failure with preserved ejection fraction- - NYHA class II - euvolemic today -weighing daily & he was reminded to call for an overnight weight gain of >2 pounds or a weekly weight gain of >5 pounds - weight down 9 pounds from last visit here 6 months ago - not adding salt to his food & reminded to closely follow a low sodium diet - BNP 12/18/17 was 842.0 - charity care application given to patient   2: HTN- - BP looks good today - he hasn't taken any furosemide recently - information given to him again regarding [facility] and explained that he needed to get established with a PCP there - BMP on 12/19/17 reviewed and showed sodium 139, potassium 3.7, creatinine 1.08 and GFR >60  3: Tobacco use- - smokes 1-2 cigarettes daily but is trying to quit - complete cessation discussed for 3 minutes with him  4: Substance use- - cocaine is being used on weekends "only" - continues to smoke marijuana on occasion  - now drinking just 1 beer daily; down from close  to 20 - overall he says that he feels much better   Patient did not bring his medications nor a list. Each medication was verbally reviewed with the patient and he was encouraged to bring the bottles to every visit to confirm accuracy of list.   Return in 4 months or sooner for any questions/problems before then.

## 2018-12-01 NOTE — Patient Instructions (Addendum)
Continue weighing daily and call for an overnight weight gain of > 2 pounds or a weekly weight gain of >5 pounds.  [facility]   [phone]  [address]  [place] [address]  Hours: Tuesday: 4:15 pm - 7:30 pm  Wednesday: 9 am - 1 pm  Thursday: 1 pm - 7:30 pm  Friday - Monday: CLOSED 

## 2019-01-11 ENCOUNTER — Encounter: Payer: Self-pay | Admitting: Family

## 2019-02-08 ENCOUNTER — Emergency Department
Admission: EM | Admit: 2019-02-08 | Discharge: 2019-02-08 | Disposition: A | Discharge status: Home / Self Care | Payer: Self-pay | Attending: Emergency Medicine | Admitting: Emergency Medicine

## 2019-02-08 ENCOUNTER — Other Ambulatory Visit: Payer: Self-pay

## 2019-02-08 ENCOUNTER — Encounter: Payer: Self-pay | Admitting: Intensive Care

## 2019-02-08 ENCOUNTER — Emergency Department: Payer: Self-pay

## 2019-02-08 DIAGNOSIS — R0602 Shortness of breath: Secondary | ICD-10-CM | POA: Insufficient documentation

## 2019-02-08 DIAGNOSIS — Z5321 Procedure and treatment not carried out due to patient leaving prior to being seen by health care provider: Secondary | ICD-10-CM | POA: Insufficient documentation

## 2019-02-08 LAB — CBC
HCT: 39.3 % (ref 39.0–52.0)
Hemoglobin: 13.3 g/dL (ref 13.0–17.0)
MCH: 31.9 pg (ref 26.0–34.0)
MCHC: 33.8 g/dL (ref 30.0–36.0)
MCV: 94.2 fL (ref 80.0–100.0)
Platelets: 181 10*3/uL (ref 150–400)
RBC: 4.17 MIL/uL — ABNORMAL LOW (ref 4.22–5.81)
RDW: 11.5 % (ref 11.5–15.5)
WBC: 3.3 10*3/uL — ABNORMAL LOW (ref 4.0–10.5)
nRBC: 0 % (ref 0.0–0.2)

## 2019-02-08 LAB — BASIC METABOLIC PANEL
Anion gap: 7 (ref 5–15)
BUN: 18 mg/dL (ref 6–20)
CO2: 26 mmol/L (ref 22–32)
Calcium: 9.1 mg/dL (ref 8.9–10.3)
Chloride: 108 mmol/L (ref 98–111)
Creatinine, Ser: 1.26 mg/dL — ABNORMAL HIGH (ref 0.61–1.24)
GFR calc Af Amer: >60 mL/min (ref >60)
GFR calc non Af Amer: >60 mL/min (ref >60)
Glucose, Bld: 118 mg/dL — ABNORMAL HIGH (ref 70–99)
Potassium: 4 mmol/L (ref 3.5–5.1)
Sodium: 141 mmol/L (ref 135–145)

## 2019-02-08 LAB — URINALYSIS, COMPLETE (UACMP) WITH MICROSCOPIC
Bacteria, UA: NONE SEEN
Bilirubin Urine: NEGATIVE
Glucose, UA: NEGATIVE mg/dL
Hgb urine dipstick: NEGATIVE
Ketones, ur: NEGATIVE mg/dL
Nitrite: NEGATIVE
Protein, ur: NEGATIVE mg/dL
Specific Gravity, Urine: 1.015 (ref 1.005–1.030)
pH: 7 (ref 5.0–8.0)

## 2019-02-08 LAB — TROPONIN I (HIGH SENSITIVITY): Troponin I (High Sensitivity): 19 ng/L — ABNORMAL HIGH (ref <18)

## 2019-02-08 NOTE — ED Triage Notes (Signed)
Patient c/o sob X3 days. Reports hx heart issues and takes lisinopril and lasix

## 2019-02-10 ENCOUNTER — Telehealth: Payer: Self-pay | Admitting: Family

## 2019-02-10 NOTE — Telephone Encounter (Signed)
Called patient back about his message he left on the phone. He was seen in the ED on 02/08/2019 as he says that he was feeling short of breath. He says that he had lab work, EKG and CXR done but did not receive the results. He said that he eventually left without being seen as he says that multiple prisoners were brought in and he was told he would have to wait and he couldn't wait any longer.   He does say that he had been recently diagnosed with a UTI and had started an antibiotic. While taking the antibiotic, he made the choice to stop his furosemide and lisinopril because he was concerned about possible interactions. Said he had been without his regular medications for a few days which prompted his ED visit.   He has since resumed his furosemide and lisinopril 2 days ago and says that today, his breathing has improved. He says that he's working on getting established with a PCP. Explained that he doesn't need to stop taking his medications just because he's been given an antibiotic and that he should call us prior to stopping any medications. Patient verbalized understanding.

## 2019-03-10 ENCOUNTER — Other Ambulatory Visit: Payer: Self-pay | Admitting: Family

## 2019-03-10 DIAGNOSIS — I5032 Chronic diastolic (congestive) heart failure: Secondary | ICD-10-CM

## 2019-03-10 MED ORDER — FUROSEMIDE 20 MG PO TABS: 20.0000 mg | ORAL_TABLET | Freq: Every day | ORAL | 3 refills | Status: DC

## 2019-03-10 MED ORDER — LISINOPRIL 2.5 MG PO TABS: 2.5000 mg | ORAL_TABLET | Freq: Every day | ORAL | 3 refills | Status: DC

## 2019-04-05 ENCOUNTER — Ambulatory Visit: Payer: Self-pay | Admitting: Family

## 2019-04-08 ENCOUNTER — Ambulatory Visit: Payer: Self-pay | Admitting: Family

## 2019-05-06 ENCOUNTER — Ambulatory Visit: Payer: Self-pay | Admitting: Family

## 2019-05-20 ENCOUNTER — Ambulatory Visit: Payer: Self-pay | Admitting: Family

## 2019-05-20 NOTE — Progress Notes (Deleted)
Patient ID: Darius [name], male    DOB: 1970/06/21, 49 y.o.   MRN: [id number]  HPI  Darius [name] is a 49 y/o male with a history of HTN, HF, current tobacco, alcohol and drug use.   Echo report from 12/19/17 reviewed and showed an EF of 60-65% along with mild Darius and mildly elevated PA pressure of 44 mm Hg.   Went to the ED 02/08/2019 but left without being seen   He presents today for a follow-up visit with a chief complaint of   Past Medical History:  Diagnosis Date  . CHF (congestive heart failure) (HCC)   . Glaucoma   . Hypertension    No past surgical history on file. No family history on file. Social History   Tobacco Use  . Smoking status: Current Every Day Smoker    Packs/day: 1.00    Types: Cigarettes  . Smokeless tobacco: Never Used  Substance Use Topics  . Alcohol use: Yes    Alcohol/week: 6.0 standard drinks    Types: 6 Cans of beer per week    Comment: 32oz beer daily 9/19- has had 3 beers since last visit   Allergies  Allergen Reactions  . Penicillins Other (See Comments)    Patient states that mother is highly allergic. 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  Has patient had a PCN reaction causing immediate rash, facial/tongue/throat swelling, SOB or lightheadedness with hypotension: No Has patient had a PCN reaction causing severe rash involving mucus membranes or skin necrosis: No Has patient had a PCN reaction that required hospitalization Yes Has patient had a PCN reaction occurring within the last 10 years: No If all of the above answers are "N      Review of Systems  Constitutional: Positive for fatigue. Negative for appetite change.  HENT: Negative for congestion, postnasal drip and sore throat.   Eyes: Positive for visual disturbance (left eye due to glaucoma).  Respiratory: Positive for apnea, shortness of breath (with no lisinopril) and wheezing (at times). Negative for cough and chest tightness.        + snoring   Cardiovascular: Positive for leg swelling (at the end of the day). Negative for chest pain and palpitations.  Gastrointestinal: Negative for abdominal distention and abdominal pain.  Endocrine: Negative.   Genitourinary: Negative.   Musculoskeletal: Negative for arthralgias, back pain and neck pain.  Skin: Negative.   Allergic/Immunologic: Negative.   Neurological: Negative for dizziness and light-headedness.  Hematological: Negative for adenopathy. Does not bruise/bleed easily.  Psychiatric/Behavioral: Positive for sleep disturbance (+ snoring). Negative for dysphoric mood. The patient is not nervous/anxious.      Physical Exam Vitals and nursing note reviewed.  Constitutional:      Appearance: He is well-developed.  HENT:     Head: Normocephalic and atraumatic.  Neck:     Vascular: No JVD.  Cardiovascular:     Rate and Rhythm: Normal rate and regular rhythm.  Pulmonary:     Effort: Pulmonary effort is normal.     Breath sounds: No wheezing or rales.  Abdominal:     General: There is no distension.     Palpations: Abdomen is soft.     Tenderness: There is no abdominal tenderness.  Musculoskeletal:        General: No tenderness.     Cervical back: Normal range of motion and neck supple.     Right lower leg: No tenderness. No edema.     Left  lower leg: No tenderness. No edema.  Skin:    General: Skin is warm and dry.  Neurological:     Mental Status: He is alert and oriented to person, place, and time.  Psychiatric:        Behavior: Behavior normal.        Thought Content: Thought content normal.    Assessment & Plan:  1: Chronic heart failure with preserved ejection fraction- - NYHA class II - euvolemic today -weighing daily & he was reminded to call for an overnight weight gain of >2 pounds or a weekly weight gain of >5 pounds - weight 151.2 pounds from last visit here 5 months ago - not adding salt to his food & reminded to closely follow a low sodium diet -  BNP 12/18/17 was 842.0 - charity care application given to patient   2: HTN- - BP  - - information given to him again regarding [facility] and explained that he needed to get established with a PCP there - BMP on 02/08/2019 reviewed and showed sodium 141, potassium 4.0, creatinine 1.26 and GFR >60  3: Tobacco use- - smokes 1-2 cigarettes daily but is trying to quit - complete cessation discussed for 3 minutes with him  4: Substance use- - cocaine is being used on weekends "only" - continues to smoke marijuana on occasion  - now drinking just 1 beer daily; down from close to 20 - overall he says that he feels much better   Patient did not bring his medications nor a list. Each medication was verbally reviewed with the patient and he was encouraged to bring the bottles to every visit to confirm accuracy of list.

## 2019-05-24 ENCOUNTER — Ambulatory Visit: Payer: Self-pay | Attending: Family | Admitting: Family

## 2019-05-24 ENCOUNTER — Other Ambulatory Visit: Payer: Self-pay

## 2019-05-24 ENCOUNTER — Encounter: Payer: Self-pay | Admitting: Family

## 2019-05-24 VITALS — BP 133/57 | HR 64 | Resp 18 | Ht 74.0 in | Wt 164.4 lb

## 2019-05-24 DIAGNOSIS — H409 Unspecified glaucoma: Secondary | ICD-10-CM | POA: Insufficient documentation

## 2019-05-24 DIAGNOSIS — R42 Dizziness and giddiness: Secondary | ICD-10-CM | POA: Insufficient documentation

## 2019-05-24 DIAGNOSIS — Z79899 Other long term (current) drug therapy: Secondary | ICD-10-CM | POA: Insufficient documentation

## 2019-05-24 DIAGNOSIS — R0602 Shortness of breath: Secondary | ICD-10-CM | POA: Insufficient documentation

## 2019-05-24 DIAGNOSIS — F17210 Nicotine dependence, cigarettes, uncomplicated: Secondary | ICD-10-CM | POA: Insufficient documentation

## 2019-05-24 DIAGNOSIS — I5032 Chronic diastolic (congestive) heart failure: Secondary | ICD-10-CM | POA: Insufficient documentation

## 2019-05-24 DIAGNOSIS — I10 Essential (primary) hypertension: Secondary | ICD-10-CM

## 2019-05-24 DIAGNOSIS — F1990 Other psychoactive substance use, unspecified, uncomplicated: Secondary | ICD-10-CM

## 2019-05-24 DIAGNOSIS — G479 Sleep disorder, unspecified: Secondary | ICD-10-CM | POA: Insufficient documentation

## 2019-05-24 DIAGNOSIS — I110 Hypertensive heart disease with heart failure: Secondary | ICD-10-CM | POA: Insufficient documentation

## 2019-05-24 DIAGNOSIS — R5383 Other fatigue: Secondary | ICD-10-CM | POA: Insufficient documentation

## 2019-05-24 DIAGNOSIS — Z720 Tobacco use: Secondary | ICD-10-CM

## 2019-05-24 NOTE — Progress Notes (Signed)
Patient ID: Darius [name], male    DOB: June 16, 1970, 49 y.o.   MRN: [id number]  HPI  Darius [name] is a 49 y/o male with a history of HTN, HF, current tobacco, alcohol and drug use.   Echo report from 12/19/17 reviewed and showed an EF of 60-65% along with mild Darius and mildly elevated PA pressure of 44 mm Hg.   Went to the ED 02/08/2019 but left without being seen   He presents today for a follow-up visit with a chief complaint of minimal fatigue upon moderate exertion. He says this has been chronic in nature having been present for several years. He has associated visual disturbance, shortness of breath, generalized body aches, light-headedness, difficulty sleeping and gradual weight gain along with this. He denies any abdominal distention, palpitations, pedal edema, cough or wheezing.   He says that he was exposed to COVID via his wife December 2020 and since then, he has had generalized body aches. Denies fever.   He says that he hasn't done any cocaine in the last month and has been eating more during this time. Does admit to feeling better since not using cocaine.   Has not gotten established with a PCP yet per previous conversations.   Past Medical History:  Diagnosis Date  . CHF (congestive heart failure) (HCC)   . Glaucoma   . Hypertension    No past surgical history on file. No family history on file. Social History   Tobacco Use  . Smoking status: Current Every Day Smoker    Packs/day: 1.00    Types: Cigarettes  . Smokeless tobacco: Never Used  Substance Use Topics  . Alcohol use: Yes    Alcohol/week: 6.0 standard drinks    Types: 6 Cans of beer per week    Comment: 32oz beer daily 9/19- has had 3 beers since last visit   Allergies  Allergen Reactions  . Penicillins Other (See Comments)    Patient states that mother is highly allergic. 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  Has patient had a PCN reaction causing immediate rash, facial/tongue/throat  swelling, SOB or lightheadedness with hypotension: No Has patient had a PCN reaction causing severe rash involving mucus membranes or skin necrosis: No Has patient had a PCN reaction that required hospitalization Yes Has patient had a PCN reaction occurring within the last 10 years: No If all of the above answers are "N    Prior to Admission medications   Medication Sig Start Date End Date Taking? Authorizing Provider  furosemide (LASIX) 20 MG tablet Take 1 tablet (20 mg total) by mouth daily. 03/10/19 06/08/19 Yes Dannette Kinkaid, Inetta Fermo A, FNP  ibuprofen (ADVIL) 600 MG tablet Take 1 tablet (600 mg total) by mouth every 8 (eight) hours as needed. 10/23/18  Yes Bridget Hartshorn L, PA-C  lisinopril (ZESTRIL) 2.5 MG tablet Take 1 tablet (2.5 mg total) by mouth daily. 03/10/19  Yes Delma Freeze, FNP     Review of Systems  Constitutional: Positive for fatigue. Negative for appetite change and fever.       + body aches since Dec 2020  HENT: Negative for congestion, postnasal drip and sore throat.   Eyes: Positive for visual disturbance (left eye due to glaucoma).  Respiratory: Positive for apnea and shortness of breath. Negative for cough, chest tightness and wheezing.        + snoring  Cardiovascular: Negative for chest pain, palpitations and leg swelling.  Gastrointestinal: Negative for abdominal  distention and abdominal pain.  Endocrine: Negative.   Genitourinary: Negative.   Musculoskeletal: Negative for arthralgias, back pain and neck pain.  Skin: Negative.   Allergic/Immunologic: Negative.   Neurological: Positive for light-headedness (at times). Negative for dizziness.  Hematological: Negative for adenopathy. Does not bruise/bleed easily.  Psychiatric/Behavioral: Positive for sleep disturbance (+ snoring). Negative for dysphoric mood. The patient is not nervous/anxious.    Vitals:   05/24/19 1440  BP: (!) 133/57  Pulse: 64  Resp: 18  SpO2: 94%  Weight: 164 lb 6.4 oz (74.6 kg)  Height:  6\' 2"  (1.88 m)   Wt Readings from Last 3 Encounters:  05/24/19 164 lb 6.4 oz (74.6 kg)  12/01/18 151 lb 2 oz (68.5 kg)  10/23/18 165 lb (74.8 kg)   Lab Results  Component Value Date   CREATININE 1.26 (H) 02/08/2019   CREATININE 1.08 12/19/2017   CREATININE 1.17 12/18/2017    Physical Exam Vitals and nursing note reviewed.  Constitutional:      Appearance: He is well-developed.  HENT:     Head: Normocephalic and atraumatic.  Neck:     Vascular: No JVD.  Cardiovascular:     Rate and Rhythm: Normal rate and regular rhythm.  Pulmonary:     Effort: Pulmonary effort is normal.     Breath sounds: No wheezing or rales.  Abdominal:     General: There is no distension.     Palpations: Abdomen is soft.     Tenderness: There is no abdominal tenderness.  Musculoskeletal:        General: No tenderness.     Cervical back: Normal range of motion and neck supple.     Right lower leg: No tenderness. No edema.     Left lower leg: No tenderness. No edema.  Skin:    General: Skin is warm and dry.  Neurological:     Mental Status: He is alert and oriented to person, place, and time.  Psychiatric:        Behavior: Behavior normal.        Thought Content: Thought content normal.    Assessment & Plan:  1: Chronic heart failure with preserved ejection fraction- - NYHA class II - euvolemic today -weighing daily & he was reminded to call for an overnight weight gain of >2 pounds or a weekly weight gain of >5 pounds - weight up 13 pounds from last visit here 5 months ago - says that he's been eating more since no longer using cocaine - not adding salt to his food & reminded to closely follow a low sodium diet - BNP 12/18/17 was 842.0  2: HTN- - BP looks good today - emphasized that he needed to get established with a PCP - BMP on 02/08/2019 reviewed and showed sodium 141, potassium 4.0, creatinine 1.26 and GFR >60  3: Tobacco use- - smokes 1-2 cigarettes daily but is trying to quit -  complete cessation discussed for 3 minutes with him  4: Substance use- - no cocaine for the last month - continues to smoke marijuana on occasion  - drinking just 1 beer daily; down from close to 20 - overall he says that he feels much better   Patient did not bring his medications nor a list. Each medication was verbally reviewed with the patient and he was encouraged to bring the bottles to every visit to confirm accuracy of list.  Return in 3 months or sooner for any questions/problems before then.

## 2019-05-24 NOTE — Patient Instructions (Signed)
Continue weighing daily and call for an overnight weight gain of > 2 pounds or a weekly weight gain of >5 pounds. 

## 2019-11-21 NOTE — Progress Notes (Signed)
Patient ID: Darius [name], male    DOB: Oct 13, 1970, 49 y.o.   MRN: [id number]  HPI  Darius [name] is a 49 y/o male with a history of HTN, HF, current tobacco, alcohol and drug use.   Echo report from 12/19/17 reviewed and showed an EF of 60-65% along with mild Darius and mildly elevated PA pressure of 44 mm Hg.   Has not been admitted or been in the ED in the last 6 months.   He presents today for a follow-up visit with a chief complaint of moderate shortness of breath upon moderate exertion. He describes this as chronic in nature having been present for several years although does feel like it has worsened these last few months. He has associated fatigue, chest pain, abdominal pain, rectal bleeding, light-headedness, difficulty sleeping and weight loss along with this. He denies any abdominal distention, palpitations, pedal edema or cough.   His biggest concern today is of his abdominal pain and rectal bleeding that has been present for 3-4 months. He says that it's usually bright red when it occurs but has occasionally been dark red. Does report a history of hemorrhoids. Wife says that he had 1-2 episodes where he also had bleeding in his urine but that resolved. Patient has not gone to the ED because he's afraid because there's a family history of pancreatic and prostate cancer.   Due to insurance issues has not gotten established with a PCP yet per previous conversations.   Past Medical History:  Diagnosis Date  . CHF (congestive heart failure) (HCC)   . Diabetes mellitus without complication (HCC)   . Glaucoma   . Hypertension    No past surgical history on file. No family history on file. Social History   Tobacco Use  . Smoking status: Current Every Day Smoker    Packs/day: 1.00    Types: Cigarettes  . Smokeless tobacco: Never Used  Substance Use Topics  . Alcohol use: Yes    Alcohol/week: 6.0 standard drinks    Types: 6 Cans of beer per week    Comment: 32oz beer daily 9/19- has had  3 beers since last visit   Allergies  Allergen Reactions  . Penicillins Other (See Comments)    Patient states that mother is highly allergic. 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  Has patient had a PCN reaction causing immediate rash, facial/tongue/throat swelling, SOB or lightheadedness with hypotension: No Has patient had a PCN reaction causing severe rash involving mucus membranes or skin necrosis: No Has patient had a PCN reaction that required hospitalization Yes Has patient had a PCN reaction occurring within the last 10 years: No If all of the above answers are "N   Prior to Admission medications   Medication Sig Start Date End Date Taking? Authorizing Provider  furosemide (LASIX) 20 MG tablet Take 1 tablet (20 mg total) by mouth daily. Patient taking differently: Take 20 mg by mouth as needed.  03/10/19 11/22/19 Yes Labrisha Wuellner A, FNP  lisinopril (ZESTRIL) 2.5 MG tablet Take 1 tablet (2.5 mg total) by mouth daily. 03/10/19  Yes Delma Freeze, FNP    Review of Systems  Constitutional: Positive for fatigue. Negative for appetite change and fever.  HENT: Negative for congestion, postnasal drip and sore throat.   Eyes: Positive for visual disturbance (left eye due to glaucoma).  Respiratory: Positive for apnea and shortness of breath. Negative for cough, chest tightness and wheezing.        +  snoring  Cardiovascular: Positive for chest pain (sometimes). Negative for palpitations and leg swelling.  Gastrointestinal: Positive for abdominal pain (right lower side) and blood in stool. Negative for abdominal distention.  Endocrine: Negative.   Genitourinary: Positive for hematuria (couple of times).  Musculoskeletal: Negative for arthralgias, back pain and neck pain.  Skin: Negative.   Allergic/Immunologic: Negative.   Neurological: Positive for light-headedness (at times). Negative for dizziness.  Hematological: Negative for adenopathy. Does not bruise/bleed  easily.  Psychiatric/Behavioral: Positive for sleep disturbance (+ snoring). Negative for dysphoric mood. The patient is not nervous/anxious.    There were no vitals filed for this visit. Wt Readings from Last 3 Encounters:  05/24/19 164 lb 6.4 oz (74.6 kg)  12/01/18 151 lb 2 oz (68.5 kg)  10/23/18 165 lb (74.8 kg)   Lab Results  Component Value Date   CREATININE 1.26 (H) 02/08/2019   CREATININE 1.08 12/19/2017   CREATININE 1.17 12/18/2017    Physical Exam Vitals and nursing note reviewed.  Constitutional:      Appearance: He is well-developed.  HENT:     Head: Normocephalic and atraumatic.  Neck:     Vascular: No JVD.  Cardiovascular:     Rate and Rhythm: Normal rate and regular rhythm.  Pulmonary:     Effort: Pulmonary effort is normal.     Breath sounds: No wheezing or rales.  Abdominal:     General: There is no distension.     Palpations: Abdomen is soft.     Tenderness: There is abdominal tenderness in the right lower quadrant. There is guarding.  Musculoskeletal:        General: No tenderness.     Cervical back: Normal range of motion and neck supple.     Right lower leg: No tenderness. No edema.     Left lower leg: No tenderness. No edema.  Skin:    General: Skin is warm and dry.  Neurological:     Mental Status: He is alert and oriented to person, place, and time.  Psychiatric:        Behavior: Behavior normal.        Thought Content: Thought content normal.    Assessment & Plan:  1: Chronic heart failure with preserved ejection fraction without structural changes- - NYHA class II - euvolemic today -weighing daily & he was reminded to call for an overnight weight gain of >2 pounds or a weekly weight gain of >5 pounds - weight down 11 pounds from last visit here 6 months ago - not adding salt to his food & reminded to closely follow a low sodium diet - BNP 12/18/17 was 842.0  2: HTN- - BP looks good today - emphasized that he needed to get established  with a PCP once his wife gets insurance coverage the end of August - BMP on 02/08/2019 reviewed and showed sodium 141, potassium 4.0, creatinine 1.26 and GFR >60 - glucose checked in clinic today was 85; juice was given as he was feeling jittery  3: Tobacco use- - smokes 1-2 cigarettes daily along with vaping - emphasized that he needed to stop vaping - complete cessation discussed for 3 minutes with him  4: Substance use- - no cocaine for quite some time - continues to smoke marijuana on occasion   5: Rectal bleeding/ abdominal pain/ weight loss- - drinking ~ 18 ounces of beer/ day (three 24 ounce cans over 4 days) - counseled on alcohol cessation - explained that the bleeding could be his  hemorrhoids or an ulcer or numerous other things but without any examination of his rectum along with lab work, xray etc, there was no way of knowing what is going on - explained that I could do the lab work but it would be in his best interest since he currently doesn't have insurance to go to the ED for evaluation - patient agrees to go to the ED & wife that is present with him says that she will take him   Patient did not bring his medications nor a list. Each medication was verbally reviewed with the patient and he was encouraged to bring the bottles to every visit to confirm accuracy of list.  Return in 6 months or sooner for any questions/problems before then.

## 2019-11-22 ENCOUNTER — Encounter: Payer: Self-pay | Admitting: Family

## 2019-11-22 ENCOUNTER — Ambulatory Visit: Payer: Self-pay | Attending: Family | Admitting: Family

## 2019-11-22 ENCOUNTER — Other Ambulatory Visit: Payer: Self-pay

## 2019-11-22 VITALS — BP 110/62 | HR 64 | Resp 18 | Ht 73.0 in | Wt 153.2 lb

## 2019-11-22 DIAGNOSIS — R109 Unspecified abdominal pain: Secondary | ICD-10-CM | POA: Insufficient documentation

## 2019-11-22 DIAGNOSIS — F1990 Other psychoactive substance use, unspecified, uncomplicated: Secondary | ICD-10-CM

## 2019-11-22 DIAGNOSIS — Z79899 Other long term (current) drug therapy: Secondary | ICD-10-CM | POA: Insufficient documentation

## 2019-11-22 DIAGNOSIS — Z880 Allergy status to penicillin: Secondary | ICD-10-CM | POA: Insufficient documentation

## 2019-11-22 DIAGNOSIS — Z8719 Personal history of other diseases of the digestive system: Secondary | ICD-10-CM | POA: Insufficient documentation

## 2019-11-22 DIAGNOSIS — I10 Essential (primary) hypertension: Secondary | ICD-10-CM

## 2019-11-22 DIAGNOSIS — F17210 Nicotine dependence, cigarettes, uncomplicated: Secondary | ICD-10-CM | POA: Insufficient documentation

## 2019-11-22 DIAGNOSIS — H42 Glaucoma in diseases classified elsewhere: Secondary | ICD-10-CM | POA: Insufficient documentation

## 2019-11-22 DIAGNOSIS — E1139 Type 2 diabetes mellitus with other diabetic ophthalmic complication: Secondary | ICD-10-CM | POA: Insufficient documentation

## 2019-11-22 DIAGNOSIS — K625 Hemorrhage of anus and rectum: Secondary | ICD-10-CM | POA: Insufficient documentation

## 2019-11-22 DIAGNOSIS — I5032 Chronic diastolic (congestive) heart failure: Secondary | ICD-10-CM | POA: Insufficient documentation

## 2019-11-22 DIAGNOSIS — Z720 Tobacco use: Secondary | ICD-10-CM

## 2019-11-22 DIAGNOSIS — I110 Hypertensive heart disease with heart failure: Secondary | ICD-10-CM | POA: Insufficient documentation

## 2019-11-22 DIAGNOSIS — R634 Abnormal weight loss: Secondary | ICD-10-CM | POA: Insufficient documentation

## 2019-11-22 DIAGNOSIS — H409 Unspecified glaucoma: Secondary | ICD-10-CM | POA: Insufficient documentation

## 2019-11-22 LAB — GLUCOSE, CAPILLARY: Glucose-Capillary: 85 mg/dL (ref 70–99)

## 2019-11-22 NOTE — Patient Instructions (Signed)
Continue weighing daily and call for an overnight weight gain of > 2 pounds or a weekly weight gain of >5 pounds. 

## 2019-11-23 ENCOUNTER — Encounter: Payer: Self-pay | Admitting: Emergency Medicine

## 2019-11-23 ENCOUNTER — Emergency Department
Admission: EM | Admit: 2019-11-23 | Discharge: 2019-11-23 | Disposition: A | Discharge status: Home / Self Care | Payer: Self-pay | Attending: Emergency Medicine | Admitting: Emergency Medicine

## 2019-11-23 ENCOUNTER — Other Ambulatory Visit: Payer: Self-pay

## 2019-11-23 ENCOUNTER — Emergency Department: Payer: Self-pay

## 2019-11-23 DIAGNOSIS — Z5321 Procedure and treatment not carried out due to patient leaving prior to being seen by health care provider: Secondary | ICD-10-CM | POA: Insufficient documentation

## 2019-11-23 DIAGNOSIS — R0602 Shortness of breath: Secondary | ICD-10-CM | POA: Insufficient documentation

## 2019-11-23 HISTORY — DX: Unspecified convulsions: R56.9

## 2019-11-23 LAB — CBC WITH DIFFERENTIAL/PLATELET
Abs Immature Granulocytes: 0.01 10*3/uL (ref 0.00–0.07)
Basophils Absolute: 0 10*3/uL (ref 0.0–0.1)
Basophils Relative: 1 %
Eosinophils Absolute: 0.1 10*3/uL (ref 0.0–0.5)
Eosinophils Relative: 4 %
HCT: 41.1 % (ref 39.0–52.0)
Hemoglobin: 13.8 g/dL (ref 13.0–17.0)
Immature Granulocytes: 0 %
Lymphocytes Relative: 28 %
Lymphs Abs: 1 10*3/uL (ref 0.7–4.0)
MCH: 32.8 pg (ref 26.0–34.0)
MCHC: 33.6 g/dL (ref 30.0–36.0)
MCV: 97.6 fL (ref 80.0–100.0)
Monocytes Absolute: 0.4 10*3/uL (ref 0.1–1.0)
Monocytes Relative: 11 %
Neutro Abs: 1.9 10*3/uL (ref 1.7–7.7)
Neutrophils Relative %: 56 %
Platelets: 170 10*3/uL (ref 150–400)
RBC: 4.21 MIL/uL — ABNORMAL LOW (ref 4.22–5.81)
RDW: 12.1 % (ref 11.5–15.5)
WBC: 3.4 10*3/uL — ABNORMAL LOW (ref 4.0–10.5)
nRBC: 0 % (ref 0.0–0.2)

## 2019-11-23 LAB — COMPREHENSIVE METABOLIC PANEL
ALT: 56 U/L — ABNORMAL HIGH (ref 0–44)
AST: 31 U/L (ref 15–41)
Albumin: 3.9 g/dL (ref 3.5–5.0)
Alkaline Phosphatase: 63 U/L (ref 38–126)
Anion gap: 6 (ref 5–15)
BUN: 15 mg/dL (ref 6–20)
CO2: 31 mmol/L (ref 22–32)
Calcium: 9.3 mg/dL (ref 8.9–10.3)
Chloride: 105 mmol/L (ref 98–111)
Creatinine, Ser: 1.28 mg/dL — ABNORMAL HIGH (ref 0.61–1.24)
GFR calc Af Amer: >60 mL/min (ref >60)
GFR calc non Af Amer: >60 mL/min (ref >60)
Glucose, Bld: 98 mg/dL (ref 70–99)
Potassium: 4.3 mmol/L (ref 3.5–5.1)
Sodium: 142 mmol/L (ref 135–145)
Total Bilirubin: 0.9 mg/dL (ref 0.3–1.2)
Total Protein: 6.7 g/dL (ref 6.5–8.1)

## 2019-11-23 NOTE — ED Notes (Signed)
Pt called x's 3, no response ?

## 2019-11-23 NOTE — ED Triage Notes (Signed)
C/O SOB.  States wife noticed while sleeping patient breaths heavy and stops sometimes.  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  AAOx3.  Skin warm and dry. NAD

## 2020-03-20 ENCOUNTER — Telehealth: Payer: Self-pay | Admitting: Family

## 2020-03-20 NOTE — Telephone Encounter (Signed)
Received phone call from patient to describe his symptoms that he's been having. Says that he called the ambulance to his home last night to be evaluated but he didn't go to the hospital.   He says that he's been feeling hot, short of breath and anxious over the last couple of days. Felt like he was so hot that he took his shirt off and tried sleeping with the window open before finally calling ambulance. He said that everything "checked out ok".   He did say that he ate 2 whole pizzas yesterday and was feeling a lot of indigestion due to that. He took his lasix this morning and said that he felt better but that he feels like he might need to go back on his anxiety medication or something for his heartburn.   Explained that I couldn't prescribe any of that and he needed to get established with a PCP, as we have talked about numerous times before. He voices understanding and says that he will try to work on that.

## 2020-05-07 ENCOUNTER — Telehealth: Payer: Self-pay | Admitting: Family

## 2020-05-07 NOTE — Telephone Encounter (Signed)
Patient called to say that he feels like he's been having panic attacks lately which has caused his shortness of breath to worsen. He says that he feels like his body is overheating and then he gets short of breath which makes him anxious/ scared which makes him more short of breath. Says that he can't spend another night by himself.   It's gotten so bad that he's called EMS 3 times in the last 2 weeks but did not go to the ED. Tells me that he's weaned himself off of the lisinopril and furosemide because he felt like the medications were making his symptoms worse.   Still has not gotten established with a PCP. Explained that I was not able to tell him over the phone what was going on and that he needed to be seen by someone. Explained that if he presents to an ED that he needed to be prepared to wait. Advised that he could always go to an Urgent Care but, again, he needs to be prepared to wait.   Patient verbalized understanding that he needs to be seen and evaluated by someone and he also understands that he needs to get established with a PCP.

## 2020-05-24 ENCOUNTER — Ambulatory Visit: Payer: Self-pay | Attending: Family | Admitting: Family

## 2020-05-24 ENCOUNTER — Other Ambulatory Visit: Payer: Self-pay

## 2020-05-24 ENCOUNTER — Encounter: Payer: Self-pay | Admitting: Family

## 2020-05-24 VITALS — BP 146/86 | HR 84 | Resp 18 | Ht 74.0 in | Wt 160.5 lb

## 2020-05-24 DIAGNOSIS — F1990 Other psychoactive substance use, unspecified, uncomplicated: Secondary | ICD-10-CM

## 2020-05-24 DIAGNOSIS — Z7982 Long term (current) use of aspirin: Secondary | ICD-10-CM | POA: Insufficient documentation

## 2020-05-24 DIAGNOSIS — Z880 Allergy status to penicillin: Secondary | ICD-10-CM | POA: Insufficient documentation

## 2020-05-24 DIAGNOSIS — F419 Anxiety disorder, unspecified: Secondary | ICD-10-CM | POA: Insufficient documentation

## 2020-05-24 DIAGNOSIS — I10 Essential (primary) hypertension: Secondary | ICD-10-CM

## 2020-05-24 DIAGNOSIS — R0789 Other chest pain: Secondary | ICD-10-CM | POA: Insufficient documentation

## 2020-05-24 DIAGNOSIS — Z716 Tobacco abuse counseling: Secondary | ICD-10-CM | POA: Insufficient documentation

## 2020-05-24 DIAGNOSIS — G479 Sleep disorder, unspecified: Secondary | ICD-10-CM | POA: Insufficient documentation

## 2020-05-24 DIAGNOSIS — F17210 Nicotine dependence, cigarettes, uncomplicated: Secondary | ICD-10-CM | POA: Insufficient documentation

## 2020-05-24 DIAGNOSIS — R5383 Other fatigue: Secondary | ICD-10-CM | POA: Insufficient documentation

## 2020-05-24 DIAGNOSIS — Z720 Tobacco use: Secondary | ICD-10-CM

## 2020-05-24 DIAGNOSIS — I5032 Chronic diastolic (congestive) heart failure: Secondary | ICD-10-CM | POA: Insufficient documentation

## 2020-05-24 DIAGNOSIS — R0602 Shortness of breath: Secondary | ICD-10-CM | POA: Insufficient documentation

## 2020-05-24 DIAGNOSIS — I110 Hypertensive heart disease with heart failure: Secondary | ICD-10-CM | POA: Insufficient documentation

## 2020-05-24 NOTE — Patient Instructions (Signed)
Continue weighing daily and call for an overnight weight gain of > 2 pounds or a weekly weight gain of >5 pounds. 

## 2020-05-24 NOTE — Progress Notes (Signed)
Patient ID: Darius [name], male    DOB: [birth year], 50 y.o.   MRN: [id number]  HPI  Darius [name] is a 50 y/o male with a history of HTN, HF, current tobacco, alcohol and drug use.   Echo report from 12/19/17 reviewed and showed an EF of 60-65% along with mild Darius and mildly elevated PA pressure of 44 mm Hg.   Has not been admitted or been in the ED in the last 6 months.   He presents today for a follow-up visit with a chief complaint of minimal shortness of breath upon moderate exertion. He describes this as chronic in nature having been present for several years and has improved since we last spoke on the phone. He has associated fatigue, intermittent chest pain, difficulty sleeping, anxiety and gradual weight gain along with this. He denies any dizziness, abdominal distention, palpitations, pedal edema, wheezing or cough.   He says that he feels like a lot of his problems may be related to when he contracted covid about 7 months ago. He says that he has an appointment at [facility] tomorrow to get established with mental health along with [facility] for primary care.   Past Medical History:  Diagnosis Date  . CHF (congestive heart failure) (HCC)   . Diabetes mellitus without complication (HCC)   . Glaucoma   . Hypertension   . Seizures (HCC)    No past surgical history on file. No family history on file. Social History   Tobacco Use  . Smoking status: Current Every Day Smoker    Packs/day: 1.00    Types: Cigarettes  . Smokeless tobacco: Never Used  Substance Use Topics  . Alcohol use: Yes    Alcohol/week: 6.0 standard drinks    Types: 6 Cans of beer per week    Comment: 32oz beer daily 9/19- has had 3 beers since last visit   Allergies  Allergen Reactions  . Penicillins Other (See Comments)    Patient states that mother is highly allergic. 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  Has patient had a PCN reaction causing immediate rash, facial/tongue/throat  swelling, SOB or lightheadedness with hypotension: No Has patient had a PCN reaction causing severe rash involving mucus membranes or skin necrosis: No Has patient had a PCN reaction that required hospitalization Yes Has patient had a PCN reaction occurring within the last 10 years: No If all of the above answers are "N   Prior to Admission medications   Medication Sig Start Date End Date Taking? Authorizing Provider  aspirin EC 81 MG tablet Take 81 mg by mouth daily. Swallow whole.   Yes [provider]  furosemide (LASIX) 20 MG tablet Take 1 tablet (20 mg total) by mouth daily. Patient not taking: Reported on 05/24/2020 03/10/19 11/22/19  Clarisa Kindred A, FNP  lisinopril (ZESTRIL) 2.5 MG tablet Take 1 tablet (2.5 mg total) by mouth daily. Patient not taking: Reported on 05/24/2020 03/10/19   Delma Freeze, FNP    Review of Systems  Constitutional: Positive for fatigue. Negative for appetite change and fever.  HENT: Negative for congestion, postnasal drip and sore throat.   Eyes: Positive for visual disturbance (left eye due to glaucoma).  Respiratory: Positive for shortness of breath. Negative for apnea, cough, chest tightness and wheezing.        + snoring  Cardiovascular: Positive for chest pain (sometimes). Negative for palpitations and leg swelling.  Gastrointestinal: Negative for abdominal distention and abdominal  pain.  Endocrine: Negative.   Genitourinary: Negative.   Musculoskeletal: Negative for arthralgias, back pain and neck pain.  Skin: Negative.   Allergic/Immunologic: Negative.   Neurological: Negative for dizziness and light-headedness.  Hematological: Negative for adenopathy. Does not bruise/bleed easily.  Psychiatric/Behavioral: Positive for sleep disturbance (+ snoring). Negative for dysphoric mood. The patient is not nervous/anxious.    Vitals:   05/24/20 1319  BP: (!) 146/86  Pulse: 84  Resp: 18  SpO2: 100%  Weight: 160 lb 8 oz (72.8 kg)  Height:  6\' 2"  (1.88 m)   Wt Readings from Last 3 Encounters:  05/24/20 160 lb 8 oz (72.8 kg)  11/22/19 153 lb 4 oz (69.5 kg)  05/24/19 164 lb 6.4 oz (74.6 kg)   Lab Results  Component Value Date   CREATININE 1.28 (H) 11/23/2019   CREATININE 1.26 (H) 02/08/2019   CREATININE 1.08 12/19/2017    Physical Exam Vitals and nursing note reviewed.  Constitutional:      Appearance: He is well-developed.  HENT:     Head: Normocephalic and atraumatic.  Neck:     Vascular: No JVD.  Cardiovascular:     Rate and Rhythm: Normal rate and regular rhythm.  Pulmonary:     Effort: Pulmonary effort is normal.     Breath sounds: No wheezing or rales.  Abdominal:     General: There is no distension.     Palpations: Abdomen is soft.     Tenderness: There is no abdominal tenderness.  Musculoskeletal:        General: No tenderness.     Cervical back: Normal range of motion and neck supple.     Right lower leg: No tenderness. No edema.     Left lower leg: No tenderness. No edema.  Skin:    General: Skin is warm and dry.  Neurological:     Mental Status: He is alert and oriented to person, place, and time.  Psychiatric:        Behavior: Behavior normal.        Thought Content: Thought content normal.    Assessment & Plan:  1: Chronic heart failure with preserved ejection fraction without structural changes- - NYHA class II - euvolemic today -weighing daily & he was reminded to call for an overnight weight gain of >2 pounds or a weekly weight gain of >5 pounds - weight up 7 pounds from last visit here 6 months ago  - not adding salt to his food & reminded to closely follow a low sodium diet - BNP 12/18/17 was 842.0 - stopped his furosemide and lisinopril and now feels "great"  2: HTN- - BP mildly elevated - says that he has an appointment at [facility] along with [facility] tomorrow - BMP on 11/23/19 reviewed and showed sodium 142, potassium 4.3, creatinine 1.28 and GFR >60  3: Tobacco use- -  smokes 1/3 ppd of cigarettes  - complete cessation discussed for 3 minutes with him  4: Substance use- - no cocaine for quite some time - continues to smoke marijuana on occasion    Return in 6 months or sooner for any questions/problems before then.

## 2020-06-21 ENCOUNTER — Ambulatory Visit: Payer: Self-pay | Admitting: Gerontology

## 2020-07-11 ENCOUNTER — Ambulatory Visit: Payer: Self-pay | Admitting: Gerontology

## 2020-07-11 ENCOUNTER — Encounter: Payer: Self-pay | Admitting: Gerontology

## 2020-07-11 ENCOUNTER — Other Ambulatory Visit: Payer: Self-pay

## 2020-07-11 VITALS — BP 92/53 | HR 71 | Temp 97.7°F | Resp 16 | Wt 160.1 lb

## 2020-07-11 DIAGNOSIS — R109 Unspecified abdominal pain: Secondary | ICD-10-CM | POA: Insufficient documentation

## 2020-07-11 DIAGNOSIS — R1011 Right upper quadrant pain: Secondary | ICD-10-CM

## 2020-07-11 DIAGNOSIS — Z720 Tobacco use: Secondary | ICD-10-CM

## 2020-07-11 DIAGNOSIS — R1031 Right lower quadrant pain: Secondary | ICD-10-CM

## 2020-07-11 DIAGNOSIS — Z7689 Persons encountering health services in other specified circumstances: Secondary | ICD-10-CM | POA: Insufficient documentation

## 2020-07-11 DIAGNOSIS — I5032 Chronic diastolic (congestive) heart failure: Secondary | ICD-10-CM

## 2020-07-11 NOTE — Progress Notes (Signed)
New Patient Office Visit  Subjective:  Patient ID: Darius [name], male    DOB: 08-05-1970  Age: 50 y.o. MRN: [id number]  CC: No chief complaint on file.   HPI Rison y/o male who has a history of CHF, Type 2 DM, glaucoma, Hypertension and Seizure presents to establish care. He has a history of CHF and  Hypertension. He states that he self discontinued his Furosemide and Lisinopril 14 weeks and reports that he feels good. He smokes 1 pack of cigarette in 3 days and admits the desire to quit. He was seen at the CHF clinic by Ms Darylene Price FNP on 05/24/20. Currently, he c/o non traumatic non radiating intermittent pain to outer quadrant of his right lower abdomen. He states that the pain started 3 months ago. He describes pain as a sqeezy sensation.  He states that pain intensity increases to 8/10. He states that his pain started when he was drinking three 22 ounce  beer daily. He reports cutting down on his daily consumption of alcoholic beverages to every other day. He notices that he experiences abdominal pain in the morning after drinking one 22 ounce beer the previous night. He had a history of Prediabetes in the past, but lost 100 pounds and has not been on any medication. He reports having glaucoma to his left eye and requests Ophthalmology referral. He has not had Colonoscopy done. Overall, he states that he's doing well, but concerned about his abdominal pain.  Past Medical History:  Diagnosis Date  . CHF (congestive heart failure) ([place])   . Diabetes mellitus without complication ([place])   . Glaucoma   . Hypertension   . Seizures ([place])     No past surgical history on file.  No family history on file.  Social History   Socioeconomic History  . Marital status: Legally Separated    Spouse name: Not on file  . Number of children: Not on file  . Years of education: Not on file  . Highest education level: Not on file  Occupational History  . Occupation: disability  Tobacco  Use  . Smoking status: Current Every Day Smoker    Packs/day: 1.00    Types: Cigarettes  . Smokeless tobacco: Never Used  Substance and Sexual Activity  . Alcohol use: Yes    Alcohol/week: 6.0 standard drinks    Types: 6 Cans of beer per week    Comment: 32oz beer daily 9/19- has had 3 beers since last visit  . Drug use: Yes    Types: Marijuana, Cocaine, Other-see comments, "Crack" cocaine    Comment:  10/2 last use of crack cocaine 9/29 9/19 last use 3-4 weeks ago (marijuana and cocaine)    . Sexual activity: Yes  Other Topics Concern  . Not on file  Social History Narrative  . Not on file   Social Determinants of Health   Financial Resource Strain: Not on file  Food Insecurity: Not on file  Transportation Needs: Not on file  Physical Activity: Not on file  Stress: Not on file  Social Connections: Not on file  Intimate Partner Violence: Not on file    ROS Review of Systems  Constitutional: Negative.   HENT: Negative.   Eyes: Positive for visual disturbance (history of glaucoma to left eye).  Respiratory: Negative.   Gastrointestinal: Positive for abdominal pain (outer quadrant to right lower abdomen).  Endocrine: Negative.   Genitourinary: Negative.   Musculoskeletal: Negative.   Skin: Negative.   Hematological: Negative.  Psychiatric/Behavioral: Negative.     Objective:   Today's Vitals: BP (!) 92/53 (BP Location: Left Arm, Patient Position: Sitting, Cuff Size: Large)   Pulse 71   Temp 97.7 F (36.5 C)   Resp 16   Wt 160 lb 1.6 oz (72.6 kg)   SpO2 97%   BMI 20.56 kg/m   Physical Exam HENT:     Head: Normocephalic and atraumatic.     Nose: Nose normal.     Mouth/Throat:     Mouth: Mucous membranes are moist.  Eyes:     Extraocular Movements: Extraocular movements intact.     Conjunctiva/sclera: Conjunctivae normal.     Pupils: Pupils are equal, round, and reactive to light.  Cardiovascular:     Rate and Rhythm: Normal rate and regular rhythm.      Pulses: Normal pulses.     Heart sounds: Normal heart sounds.  Pulmonary:     Effort: Pulmonary effort is normal.     Breath sounds: Normal breath sounds.  Abdominal:     General: Abdomen is flat. Bowel sounds are normal.     Palpations: Abdomen is soft.     Tenderness: There is no right CVA tenderness or left CVA tenderness. Negative signs include Rovsing's sign and McBurney's sign.    Musculoskeletal:        General: Normal range of motion.     Cervical back: Normal range of motion.  Skin:    General: Skin is warm and dry.  Neurological:     General: No focal deficit present.     Mental Status: He is alert and oriented to person, place, and time. Mental status is at baseline.  Psychiatric:        Mood and Affect: Mood normal.        Behavior: Behavior normal.        Thought Content: Thought content normal.        Judgment: Judgment normal.     Assessment & Plan:    1. Chronic diastolic congestive heart failure ([place]) - He will continue to follow up at the CHF clinic.  2. Tobacco use - He was strongly encouraged on smoking cessation, was provided with [place] Quit line information  3. Encounter to establish care - Routine labs will be checked. - Comp Met (CMET); Future - CBC w/Diff; Future - Urinalysis; Future - HgB A1c; Future - PSA; Future - Lipid panel; Future - Ambulatory referral to Gastroenterology for Colonoscopy screening - Ambulatory referral to Ophthalmology with Dr Waldemar Dickens  4. Right upper quadrant abdominal pain - He was advised on alcohol abstinence, Ddx Pancreatitis will check  - Lipase; Future  5. Right lower quadrant abdominal pain - Will check routine labs, negative Rovsing's and McBurney's sign , advised to go to the ED for worsening symptoms.    Follow-up: Return in about 2 weeks (around 07/25/2020), or if symptoms worsen or fail to improve.   Rossetta Kama Jerold Coombe, NP

## 2020-07-11 NOTE — Patient Instructions (Signed)
Alcohol Abuse and Dependence Information, Adult Alcohol is a widely available drug. People drink alcohol in different amounts. People who drink alcohol very often and in large amounts often have problems during and after drinking. They may develop what is called an alcohol use disorder. There are two main types of alcohol use disorders:  Alcohol abuse. This is when you use alcohol too much or too often. You may use alcohol to make yourself feel happy or to reduce stress. You may have a hard time setting a limit on the amount you drink.  Alcohol dependence. This is when you use alcohol consistently for a period of time, and your body changes as a result. This can make it hard to stop drinking because you may start to feel sick or feel different when you do not use alcohol. These symptoms are known as withdrawal. How can alcohol abuse and dependence affect me? Alcohol abuse and dependence can have a negative effect on your life. Drinking too much can lead to addiction. You may feel like you need alcohol to function normally. You may drink alcohol before work in the morning, during the day, or as soon as you get home from work in the evening. These actions can result in:  Poor work performance.  Job loss.  Financial problems.  Car crashes or criminal charges from driving after drinking alcohol.  Problems in your relationships with friends and family.  Losing the trust and respect of coworkers, friends, and family. Drinking heavily over a long period of time can permanently damage your body and brain, and can cause lifelong health issues, such as:  Damage to your liver or pancreas.  Heart problems, high blood pressure, or stroke.  Certain cancers.  Decreased ability to fight infections.  Brain or nerve damage.  Depression.  Early (premature) death. If you are careless or you crave alcohol, it is easy to drink more than your body can handle (overdose). Alcohol overdose is a serious  situation that requires hospitalization. It may lead to permanent injuries or death. What can increase my risk?  Having a family history of alcohol abuse.  Having depression or other mental health conditions.  Beginning to drink at an early age.  Binge drinking often.  Experiencing trauma, stress, and an unstable home life during childhood.  Spending time with people who drink often. What actions can I take to prevent or manage alcohol abuse and dependence?  Do not drink alcohol if: ? Your health care provider tells you not to drink. ? You are pregnant, may be pregnant, or are planning to become pregnant.  If you drink alcohol: ? Limit how much you use to:  0-1 drink a day for women.  0-2 drinks a day for men. ? Be aware of how much alcohol is in your drink. In the [place] one drink equals one 12 oz bottle of beer (355 mL), one 5 oz glass of wine (148 mL), or one 1 oz glass of hard liquor (44 mL).  Stop drinking if you have been drinking too much. This can be very hard to do if you are used to abusing alcohol. If you begin to have withdrawal symptoms, talk with your health care provider or a person that you trust. These symptoms may include anxiety, shaky hands, headache, nausea, sweating, or not being able to sleep.  Choose to drink nonalcoholic beverages in social gatherings and places where there may be alcohol. Activity  Spend more time on activities that you enjoy that do   not involve alcohol, like hobbies or exercise.  Find healthy ways to cope with stress, such as exercise, meditation, or spending time with people you care about. General information  Talk to your family, coworkers, and friends about supporting you in your efforts to stop drinking. If they drink, ask them not to drink around you. Spend more time with people who do not drink alcohol.  If you think that you have an alcohol dependency problem: ? Tell friends or family about your concerns. ? Talk with your  health care provider or another health professional about where to get help. ? Work with a therapist and a chemical dependency counselor. ? Consider joining a support group for people who struggle with alcohol abuse and dependence. Where to find support  Your health care provider.  SMART Recovery: www.smartrecovery.org Therapy and support groups  Local treatment centers or chemical dependency counselors.  Local AA groups in your community: www.aa.org   Where to find more information  Centers for Disease Control and Prevention: www.cdc.gov  National Institute on Alcohol Abuse and Alcoholism: www.niaaa.nih.gov  Alcoholics Anonymous (AA): www.aa.org Contact a health care provider if:  You drank more or for longer than you intended on more than one occasion.  You tried to stop drinking or to cut back on how much you drink, but you were not able to.  You often drink to the point of vomiting or passing out.  You want to drink so badly that you cannot think about anything else.  You have problems in your life due to drinking, but you continue to drink.  You keep drinking even though you feel anxious, depressed, or have experienced memory loss.  You have stopped doing the things you used to enjoy in order to drink.  You have to drink more than you used to in order to get the effect you want.  You experience anxiety, sweating, nausea, shakiness, and trouble sleeping when you try to stop drinking. Get help right away if:  You have thoughts about hurting yourself or others.  You have serious withdrawal symptoms, including: ? Confusion. ? Racing heart. ? High blood pressure. ? Fever. If you ever feel like you may hurt yourself or others, or have thoughts about taking your own life, get help right away. You can go to your nearest emergency department or call:  Your local emergency services (911 in the [place]  A suicide crisis helpline, such as the National Suicide Prevention  Lifeline at 1-800-273-8255. This is open 24 hours a day. Summary  Alcohol abuse and dependence can have a negative effect on your life. Drinking too much or too often can lead to addiction.  If you drink alcohol, limit how much you use.  If you are having trouble keeping your drinking under control, find ways to change your behavior. Hobbies, calming activities, exercise, or support groups can help.  If you feel you need help with changing your drinking habits, talk with your health care provider, a good friend, or a therapist, or go to an AA group. This information is not intended to replace advice given to you by your health care provider. Make sure you discuss any questions you have with your health care provider. Document Revised: 08/03/2018 Document Reviewed: 06/22/2018 Elsevier Patient Education  2021 Elsevier Inc.  

## 2020-07-18 ENCOUNTER — Other Ambulatory Visit: Payer: Self-pay

## 2020-07-18 DIAGNOSIS — R1011 Right upper quadrant pain: Secondary | ICD-10-CM

## 2020-07-18 DIAGNOSIS — Z7689 Persons encountering health services in other specified circumstances: Secondary | ICD-10-CM

## 2020-07-19 LAB — COMPREHENSIVE METABOLIC PANEL
ALT: 15 IU/L (ref 0–44)
AST: 25 IU/L (ref 0–40)
Albumin/Globulin Ratio: 2 (ref 1.2–2.2)
Albumin: 4 g/dL (ref 3.8–4.9)
Alkaline Phosphatase: 86 IU/L (ref 44–121)
BUN/Creatinine Ratio: 12 (ref 9–20)
BUN: 14 mg/dL (ref 6–24)
Bilirubin Total: 0.4 mg/dL (ref 0.0–1.2)
CO2: 20 mmol/L (ref 20–29)
Calcium: 9 mg/dL (ref 8.7–10.2)
Chloride: 108 mmol/L — ABNORMAL HIGH (ref 96–106)
Creatinine, Ser: 1.16 mg/dL (ref 0.76–1.27)
Globulin, Total: 2 g/dL (ref 1.5–4.5)
Glucose: 90 mg/dL (ref 65–99)
Potassium: 4.6 mmol/L (ref 3.5–5.2)
Sodium: 140 mmol/L (ref 134–144)
Total Protein: 6 g/dL (ref 6.0–8.5)
eGFR: 76 mL/min/{1.73_m2} (ref >59)

## 2020-07-19 LAB — CBC WITH DIFFERENTIAL/PLATELET
Basophils Absolute: 0 10*3/uL (ref 0.0–0.2)
Basos: 1 %
EOS (ABSOLUTE): 0.2 10*3/uL (ref 0.0–0.4)
Eos: 5 %
Hematocrit: 39.1 % (ref 37.5–51.0)
Hemoglobin: 13 g/dL (ref 13.0–17.7)
Immature Grans (Abs): 0 10*3/uL (ref 0.0–0.1)
Immature Granulocytes: 0 %
Lymphocytes Absolute: 0.7 10*3/uL (ref 0.7–3.1)
Lymphs: 22 %
MCH: 31.2 pg (ref 26.6–33.0)
MCHC: 33.2 g/dL (ref 31.5–35.7)
MCV: 94 fL (ref 79–97)
Monocytes Absolute: 0.4 10*3/uL (ref 0.1–0.9)
Monocytes: 12 %
Neutrophils Absolute: 2 10*3/uL (ref 1.4–7.0)
Neutrophils: 60 %
Platelets: 192 10*3/uL (ref 150–450)
RBC: 4.17 x10E6/uL (ref 4.14–5.80)
RDW: 12.6 % (ref 11.6–15.4)
WBC: 3.3 10*3/uL — ABNORMAL LOW (ref 3.4–10.8)

## 2020-07-19 LAB — PSA: Prostate Specific Ag, Serum: 1.2 ng/mL (ref 0.0–4.0)

## 2020-07-19 LAB — URINALYSIS
Bilirubin, UA: NEGATIVE
Glucose, UA: NEGATIVE
Ketones, UA: NEGATIVE
Nitrite, UA: NEGATIVE
RBC, UA: NEGATIVE
Specific Gravity, UA: 1.018 (ref 1.005–1.030)
Urobilinogen, Ur: 1 mg/dL (ref 0.2–1.0)
pH, UA: 6 (ref 5.0–7.5)

## 2020-07-19 LAB — LIPID PANEL
Chol/HDL Ratio: 2.8 ratio (ref 0.0–5.0)
Cholesterol, Total: 144 mg/dL (ref 100–199)
HDL: 52 mg/dL (ref >39)
LDL Chol Calc (NIH): 73 mg/dL (ref 0–99)
Triglycerides: 102 mg/dL (ref 0–149)
VLDL Cholesterol Cal: 19 mg/dL (ref 5–40)

## 2020-07-19 LAB — HEMOGLOBIN A1C
Est. average glucose Bld gHb Est-mCnc: 94 mg/dL
Hgb A1c MFr Bld: 4.9 % (ref 4.8–5.6)

## 2020-07-19 LAB — LIPASE: Lipase: 36 U/L (ref 13–78)

## 2020-07-25 ENCOUNTER — Ambulatory Visit: Payer: Self-pay | Admitting: Gerontology

## 2020-08-14 ENCOUNTER — Telehealth: Payer: Self-pay

## 2020-08-14 ENCOUNTER — Other Ambulatory Visit: Payer: Self-pay

## 2020-08-14 DIAGNOSIS — Z1211 Encounter for screening for malignant neoplasm of colon: Secondary | ICD-10-CM

## 2020-08-14 MED ORDER — NA SULFATE-K SULFATE-MG SULF 17.5-3.13-1.6 GM/177ML PO SOLN: 1.0000 | Freq: Once | ORAL | 0 refills | Status: AC

## 2020-08-14 NOTE — Telephone Encounter (Signed)
Gastroenterology Pre-Procedure Review  Request Date: 08/22/20 Requesting Physician: Dr. Maximino Greenland  PATIENT REVIEW QUESTIONS: The patient responded to the following health history questions as indicated:    1. Are you having any GI issues? no 2. Do you have a personal history of Polyps? no 3. Do you have a family history of Colon Cancer or Polyps? no 4. Diabetes Mellitus? no 5. Joint replacements in the past 12 months?no 6. Major health problems in the past 3 months?yes (ER Visit 11/23/19 CHF) 7. Any artificial heart valves, MVP, or defibrillator?no    MEDICATIONS & ALLERGIES:    Patient reports the following regarding taking any anticoagulation/antiplatelet therapy:   Plavix, Coumadin, Eliquis, Xarelto, Lovenox, Pradaxa, Brilinta, or Effient? no Aspirin? no  Patient confirms/reports the following medications:  Current Outpatient Medications  Medication Sig Dispense Refill  . aspirin EC 81 MG tablet Take 81 mg by mouth daily. Swallow whole.     No current facility-administered medications for this visit.    Patient confirms/reports the following allergies:  Allergies  Allergen Reactions  . Penicillins Other (See Comments)    Patient states that mother is highly allergic. 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  Has patient had a PCN reaction causing immediate rash, facial/tongue/throat swelling, SOB or lightheadedness with hypotension: No Has patient had a PCN reaction causing severe rash involving mucus membranes or skin necrosis: No Has patient had a PCN reaction that required hospitalization Yes Has patient had a PCN reaction occurring within the last 10 years: No If all of the above answers are "N    No orders of the defined types were placed in this encounter.   AUTHORIZATION INFORMATION Primary Insurance: 1D#: Group #:  Secondary Insurance: 1D#: Group #:  SCHEDULE INFORMATION: Date: 08/21/20 Time: Location:[facility]

## 2020-08-15 ENCOUNTER — Other Ambulatory Visit: Payer: Self-pay

## 2020-08-15 ENCOUNTER — Encounter: Payer: Self-pay | Admitting: Gerontology

## 2020-08-15 ENCOUNTER — Ambulatory Visit: Payer: Self-pay | Admitting: Gerontology

## 2020-08-15 VITALS — BP 108/59 | HR 96 | Temp 97.3°F | Resp 16 | Wt 158.0 lb

## 2020-08-15 DIAGNOSIS — Z720 Tobacco use: Secondary | ICD-10-CM

## 2020-08-15 DIAGNOSIS — R0602 Shortness of breath: Secondary | ICD-10-CM | POA: Insufficient documentation

## 2020-08-15 MED ORDER — ALBUTEROL SULFATE HFA 108 (90 BASE) MCG/ACT IN AERS
2.0000 | INHALATION_SPRAY | Freq: Four times a day (QID) | RESPIRATORY_TRACT | 3 refills | Status: DC | PRN
  Filled 2020-08-15: qty 8.5, 25d supply, fill #0

## 2020-08-15 NOTE — Progress Notes (Signed)
Established Patient Office Visit  Subjective:  Patient ID: Darius [name], male    DOB: [birth year]  Age: 50 y.o. MRN: [id number]  CC: No chief complaint on file.   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 He states that he stopped drinking alcohol and the right upper quadrant pain has resolved. He continues to experience mild shortness of breath with moderate exertion. He denies cough, nor orthopnea. His lab results were unremarkable. He states that he takes only aspirin , smokes 1-2 cigarettes in 3 days and admits the desire to quit. He will follow up with Gastroenterology for Colonoscopy screening on 08/22/20. He states that he will schedule an Ophthalmology appointment after his Gastroenterology appointment. Overall, he states that he's doing well, starts a new job and offers no further complaint.  Past Medical History:  Diagnosis Date  . CHF (congestive heart failure) (HCC)   . Diabetes mellitus without complication (HCC)   . Glaucoma   . Hypertension   . Seizures (HCC)     No past surgical history on file.  No family history on file.  Social History   Socioeconomic History  . Marital status: Legally Separated    Spouse name: Not on file  . Number of children: Not on file  . Years of education: Not on file  . Highest education level: Not on file  Occupational History  . Occupation: disability  Tobacco Use  . Smoking status: Current Every Day Smoker    Packs/day: 1.00    Types: Cigarettes  . Smokeless tobacco: Never Used  Substance and Sexual Activity  . Alcohol use: Yes    Alcohol/week: 6.0 standard drinks    Types: 6 Cans of beer per week    Comment: 32oz beer daily 9/19- has had 3 beers since last visit  . Drug use: Yes    Types: Marijuana, Cocaine, Other-see comments, "Crack" cocaine    Comment:  10/2 last use of crack cocaine 9/29 9/19 last use 3-4 weeks ago (marijuana  and cocaine)    . Sexual activity: Yes  Other Topics Concern  . Not on file  Social History Narrative  . Not on file   Social Determinants of Health   Financial Resource Strain: Not on file  Food Insecurity: Not on file  Transportation Needs: Not on file  Physical Activity: Not on file  Stress: Not on file  Social Connections: Not on file  Intimate Partner Violence: Not on file    Outpatient Medications Prior to Visit  Medication Sig Dispense Refill  . aspirin EC 81 MG tablet Take 81 mg by mouth daily. Swallow whole.     No facility-administered medications prior to visit.    Allergies  Allergen Reactions  . Penicillins Other (See Comments)    Patient states that mother is highly allergic. 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  Has patient had a PCN reaction causing immediate rash, facial/tongue/throat swelling, SOB or lightheadedness with hypotension: No Has patient had a PCN reaction causing severe rash involving mucus membranes or skin necrosis: No Has patient had a PCN reaction that required hospitalization Yes Has patient had a PCN reaction occurring within the last 10 years: No If all of the above answers are "N    ROS Review of Systems  Constitutional: Negative.   HENT: Negative.   Respiratory: Positive for shortness of breath.  Cardiovascular: Negative.   Neurological: Negative.       Objective:    Physical Exam HENT:     Head: Normocephalic and atraumatic.  Cardiovascular:     Rate and Rhythm: Normal rate and regular rhythm.     Pulses: Normal pulses.  Pulmonary:     Effort: Pulmonary effort is normal.     Breath sounds: Normal breath sounds.  Abdominal:     General: Abdomen is flat. Bowel sounds are normal.     Palpations: Abdomen is soft.     Tenderness: There is no right CVA tenderness, left CVA tenderness or guarding.  Skin:    General: Skin is warm.  Neurological:     General: No focal deficit present.     Mental  Status: He is alert and oriented to person, place, and time. Mental status is at baseline.  Psychiatric:        Mood and Affect: Mood normal.        Behavior: Behavior normal.        Thought Content: Thought content normal.        Judgment: Judgment normal.     BP (!) 108/59 (BP Location: Left Arm, Patient Position: Sitting, Cuff Size: Large)   Pulse 96   Temp (!) 97.3 F (36.3 C)   Resp 16   Wt 158 lb (71.7 kg)   SpO2 96%   BMI 20.29 kg/m  Wt Readings from Last 3 Encounters:  08/15/20 158 lb (71.7 kg)  07/18/20 161 lb (73 kg)  07/11/20 160 lb 1.6 oz (72.6 kg)     Health Maintenance Due  Topic Date Due  . Hepatitis C Screening  Never done  . PNEUMOCOCCAL POLYSACCHARIDE VACCINE AGE 65-64 HIGH RISK  Never done  . COVID-19 Vaccine (1) Never done  . FOOT EXAM  Never done  . OPHTHALMOLOGY EXAM  Never done  . URINE MICROALBUMIN  Never done  . TETANUS/TDAP  Never done  . COLONOSCOPY (Pts 45-48yrs Insurance coverage will need to be confirmed)  Never done    There are no preventive care reminders to display for this patient.  Lab Results  Component Value Date   TSH 0.758 12/18/2017   Lab Results  Component Value Date   WBC 3.3 (L) 07/18/2020   HGB 13.0 07/18/2020   HCT 39.1 07/18/2020   MCV 94 07/18/2020   PLT 192 07/18/2020   Lab Results  Component Value Date   NA 140 07/18/2020   K 4.6 07/18/2020   CO2 20 07/18/2020   GLUCOSE 90 07/18/2020   BUN 14 07/18/2020   CREATININE 1.16 07/18/2020   BILITOT 0.4 07/18/2020   ALKPHOS 86 07/18/2020   AST 25 07/18/2020   ALT 15 07/18/2020   PROT 6.0 07/18/2020   ALBUMIN 4.0 07/18/2020   CALCIUM 9.0 07/18/2020   ANIONGAP 6 11/23/2019   Lab Results  Component Value Date   CHOL 144 07/18/2020   Lab Results  Component Value Date   HDL 52 07/18/2020   Lab Results  Component Value Date   LDLCALC 73 07/18/2020   Lab Results  Component Value Date   TRIG 102 07/18/2020   Lab Results  Component Value Date    CHOLHDL 2.8 07/18/2020   Lab Results  Component Value Date   HGBA1C 4.9 07/18/2020      Assessment & Plan:    1. Shortness of breath - Possible COPD giving his long years of smoking history. He will start on Albuterol as needed for  shortness of breath. He was educated on medication side effects and advised to notify clinic. He was advised to go to the ED with worsening symptoms. - albuterol (VENTOLIN HFA) 108 (90 Base) MCG/ACT inhaler; Inhale 2 puffs into the lungs every 6 (six) hours as needed for wheezing or shortness of breath.  Dispense: 8.5 g; Refill: 3  2. Tobacco use - He was encouraged on smoking cessation, provided with [place] [facility] information. He will [facility] at the [facility] for Lung cancer screening. - CT CHEST LUNG CA SCREEN LOW DOSE W/O CM; Future    Follow-up: Return in about 14 weeks (around 11/21/2020), or if symptoms worsen or fail to improve.    Bronsen Serano Trellis Paganini, NP

## 2020-08-15 NOTE — Patient Instructions (Signed)

## 2020-08-16 ENCOUNTER — Other Ambulatory Visit: Payer: Self-pay

## 2020-08-20 ENCOUNTER — Telehealth: Payer: Self-pay | Admitting: *Deleted

## 2020-08-20 ENCOUNTER — Encounter: Payer: Self-pay | Admitting: *Deleted

## 2020-08-20 DIAGNOSIS — Z87891 Personal history of nicotine dependence: Secondary | ICD-10-CM

## 2020-08-20 DIAGNOSIS — Z122 Encounter for screening for malignant neoplasm of respiratory organs: Secondary | ICD-10-CM

## 2020-08-20 DIAGNOSIS — F17200 Nicotine dependence, unspecified, uncomplicated: Secondary | ICD-10-CM

## 2020-08-20 NOTE — Telephone Encounter (Signed)
Received referral for initial lung cancer screening scan. Contacted patient and obtained smoking history,(current smoker, 1 ppd x 35 yrs) as well as answering questions related to screening process. Patient denies signs of lung cancer such as weight loss or hemoptysis. Patient denies comorbidity that would prevent curative treatment if lung cancer were found. Patient is scheduled for shared decision making visit and CT scan on 08/24/20 @ 1:15pm.

## 2020-08-21 ENCOUNTER — Telehealth: Payer: Self-pay

## 2020-08-21 NOTE — Telephone Encounter (Signed)
Patient left voicemail asking for [place] to cancel his procedure appt for tomorrow with Dr. Karie Schwalbe.

## 2020-08-21 NOTE — Telephone Encounter (Signed)
Called the endoscopy unit to cancel his procedure.

## 2020-08-22 ENCOUNTER — Ambulatory Visit: Admission: RE | Admit: 2020-08-22 | Payer: Self-pay | Source: Home / Self Care | Admitting: Gastroenterology

## 2020-08-22 ENCOUNTER — Encounter: Admission: RE | Payer: Self-pay | Source: Home / Self Care

## 2020-08-22 SURGERY — COLONOSCOPY WITH PROPOFOL
Anesthesia: General

## 2020-08-24 ENCOUNTER — Inpatient Hospital Stay: Payer: Self-pay | Attending: Oncology | Admitting: Nurse Practitioner

## 2020-08-24 ENCOUNTER — Ambulatory Visit
Admission: RE | Admit: 2020-08-24 | Discharge: 2020-08-24 | Disposition: A | Discharge status: Home / Self Care | Payer: Self-pay | Source: Ambulatory Visit | Attending: Oncology | Admitting: Oncology

## 2020-08-24 ENCOUNTER — Other Ambulatory Visit: Payer: Self-pay

## 2020-08-24 DIAGNOSIS — Z87891 Personal history of nicotine dependence: Secondary | ICD-10-CM

## 2020-08-24 DIAGNOSIS — F17210 Nicotine dependence, cigarettes, uncomplicated: Secondary | ICD-10-CM

## 2020-08-24 DIAGNOSIS — Z122 Encounter for screening for malignant neoplasm of respiratory organs: Secondary | ICD-10-CM | POA: Insufficient documentation

## 2020-08-24 DIAGNOSIS — F17200 Nicotine dependence, unspecified, uncomplicated: Secondary | ICD-10-CM | POA: Insufficient documentation

## 2020-08-24 NOTE — Progress Notes (Signed)
Virtual Visit via Video Enabled Telemedicine Note   I connected with Darius [name] on 08/24/20 at 1:15 PM EST by video enabled telemedicine visit and verified that I am speaking with the correct person using two identifiers.   I discussed the limitations, risks, security and privacy concerns of performing an evaluation and management service by telemedicine and the availability of in-person appointments. I also discussed with the patient that there may be a patient responsible charge related to this service. The patient expressed understanding and agreed to proceed.   Other persons participating in the visit and their role in the encounter: Burgess Estelle, RN- checking in patient & navigation  Patient's location: Hospital  Provider's location: home  Chief Complaint: Low Dose CT Screening  Patient agreed to evaluation by telemedicine to discuss shared decision making for consideration of low dose CT lung cancer screening.    In accordance with CMS guidelines, patient has met eligibility criteria including age, absence of signs or symptoms of lung cancer.  Social History   Tobacco Use  . Smoking status: Current Every Day Smoker    Packs/day: 1.00    Years: 35.00    Pack years: 35.00    Types: Cigarettes  . Smokeless tobacco: Never Used  Substance Use Topics  . Alcohol use: Yes    Alcohol/week: 6.0 standard drinks    Types: 6 Cans of beer per week    Comment: 32oz beer daily 9/19- has had 3 beers since last visit     A shared decision-making session was conducted prior to the performance of CT scan. This includes one or more decision aids, includes benefits and harms of screening, follow-up diagnostic testing, over-diagnosis, false positive rate, and total radiation exposure.   Counseling on the importance of adherence to annual lung cancer LDCT screening, impact of co-morbidities, and ability or willingness to undergo diagnosis and treatment is imperative for compliance of the  program.   Counseling on the importance of continued smoking cessation for former smokers; the importance of smoking cessation for current smokers, and information about tobacco cessation interventions have been given to patient including [place] and [employer] [place] programs.   Written order for lung cancer screening with LDCT has been given to the patient and any and all questions have been answered to the best of my abilities.    Yearly follow up will be coordinated by Burgess Estelle, Thoracic Navigator.  I discussed the assessment and treatment plan with the patient. The patient was provided an opportunity to ask questions and all were answered. The patient agreed with the plan and demonstrated an understanding of the instructions.   The patient was advised to call back or seek an in-person evaluation if the symptoms worsen or if the condition fails to improve as anticipated.   I provided 15 minutes of face-to-face video visit time dedicated to the care of this patient on the date of this encounter to include pre-visit review of smoking history, face-to-face time with the patient, and post visit ordering of testing/documentation.   Beckey Rutter, DNP, AGNP-C [place] at [facility] [phone] (clinic)

## 2020-08-27 ENCOUNTER — Telehealth: Payer: Self-pay | Admitting: *Deleted

## 2020-08-27 NOTE — Telephone Encounter (Signed)
Notified patient of LDCT lung cancer screening program results with recommendation for 12 month follow up imaging. Also notified of incidental findings noted below and is encouraged to discuss further with PCP who will receive a copy of this note and/or the CT report. Patient verbalizes understanding.   IMPRESSION: Lung-RADS 1, negative. Continue annual screening with low-dose chest CT without contrast in 12 months.  Aortic Atherosclerosis (ICD10-I70.0) and Emphysema (ICD10-J43.9). 

## 2020-09-15 ENCOUNTER — Other Ambulatory Visit: Payer: Self-pay

## 2020-09-15 ENCOUNTER — Emergency Department: Payer: Self-pay

## 2020-09-15 ENCOUNTER — Emergency Department
Admission: EM | Admit: 2020-09-15 | Discharge: 2020-09-15 | Disposition: A | Discharge status: Home / Self Care | Payer: Self-pay | Attending: Emergency Medicine | Admitting: Emergency Medicine

## 2020-09-15 DIAGNOSIS — Z7982 Long term (current) use of aspirin: Secondary | ICD-10-CM | POA: Insufficient documentation

## 2020-09-15 DIAGNOSIS — E119 Type 2 diabetes mellitus without complications: Secondary | ICD-10-CM | POA: Insufficient documentation

## 2020-09-15 DIAGNOSIS — U071 COVID-19: Secondary | ICD-10-CM | POA: Insufficient documentation

## 2020-09-15 DIAGNOSIS — I509 Heart failure, unspecified: Secondary | ICD-10-CM | POA: Insufficient documentation

## 2020-09-15 DIAGNOSIS — I110 Hypertensive heart disease with heart failure: Secondary | ICD-10-CM | POA: Insufficient documentation

## 2020-09-15 DIAGNOSIS — Z7952 Long term (current) use of systemic steroids: Secondary | ICD-10-CM | POA: Insufficient documentation

## 2020-09-15 DIAGNOSIS — F17210 Nicotine dependence, cigarettes, uncomplicated: Secondary | ICD-10-CM | POA: Insufficient documentation

## 2020-09-15 DIAGNOSIS — J441 Chronic obstructive pulmonary disease with (acute) exacerbation: Secondary | ICD-10-CM | POA: Insufficient documentation

## 2020-09-15 DIAGNOSIS — R109 Unspecified abdominal pain: Secondary | ICD-10-CM | POA: Insufficient documentation

## 2020-09-15 DIAGNOSIS — Z8616 Personal history of COVID-19: Secondary | ICD-10-CM

## 2020-09-15 HISTORY — DX: Personal history of COVID-19: Z86.16

## 2020-09-15 LAB — BASIC METABOLIC PANEL
Anion gap: 11 (ref 5–15)
BUN: 22 mg/dL — ABNORMAL HIGH (ref 6–20)
CO2: 21 mmol/L — ABNORMAL LOW (ref 22–32)
Calcium: 9.2 mg/dL (ref 8.9–10.3)
Chloride: 102 mmol/L (ref 98–111)
Creatinine, Ser: 1.54 mg/dL — ABNORMAL HIGH (ref 0.61–1.24)
GFR, Estimated: 54 mL/min — ABNORMAL LOW (ref >60)
Glucose, Bld: 113 mg/dL — ABNORMAL HIGH (ref 70–99)
Potassium: 4 mmol/L (ref 3.5–5.1)
Sodium: 134 mmol/L — ABNORMAL LOW (ref 135–145)

## 2020-09-15 LAB — RESP PANEL BY RT-PCR (FLU A&B, COVID) ARPGX2
Influenza A by PCR: NEGATIVE
Influenza B by PCR: NEGATIVE
SARS Coronavirus 2 by RT PCR: POSITIVE — AB

## 2020-09-15 LAB — TROPONIN I (HIGH SENSITIVITY)
Troponin I (High Sensitivity): 77 ng/L — ABNORMAL HIGH (ref <18)
Troponin I (High Sensitivity): 62 ng/L — ABNORMAL HIGH (ref <18)

## 2020-09-15 LAB — HEPATIC FUNCTION PANEL
ALT: 36 U/L (ref 0–44)
AST: 44 U/L — ABNORMAL HIGH (ref 15–41)
Albumin: 4.6 g/dL (ref 3.5–5.0)
Alkaline Phosphatase: 56 U/L (ref 38–126)
Bilirubin, Direct: 0.1 mg/dL (ref 0.0–0.2)
Indirect Bilirubin: 1.2 mg/dL — ABNORMAL HIGH (ref 0.3–0.9)
Total Bilirubin: 1.3 mg/dL — ABNORMAL HIGH (ref 0.3–1.2)
Total Protein: 7.7 g/dL (ref 6.5–8.1)

## 2020-09-15 LAB — CBC
HCT: 44.9 % (ref 39.0–52.0)
Hemoglobin: 15.3 g/dL (ref 13.0–17.0)
MCH: 31.5 pg (ref 26.0–34.0)
MCHC: 34.1 g/dL (ref 30.0–36.0)
MCV: 92.6 fL (ref 80.0–100.0)
Platelets: 181 10*3/uL (ref 150–400)
RBC: 4.85 MIL/uL (ref 4.22–5.81)
RDW: 12.2 % (ref 11.5–15.5)
WBC: 2.8 10*3/uL — ABNORMAL LOW (ref 4.0–10.5)
nRBC: 0 % (ref 0.0–0.2)

## 2020-09-15 LAB — LIPASE, BLOOD: Lipase: 43 U/L (ref 11–51)

## 2020-09-15 MED ORDER — DOXYCYCLINE HYCLATE 100 MG PO TABS
100.0000 mg | ORAL_TABLET | Freq: Once | ORAL | Status: AC
  Administered 2020-09-15: 100 mg via ORAL
  Filled 2020-09-15: qty 1

## 2020-09-15 MED ORDER — METHYLPREDNISOLONE SODIUM SUCC 125 MG IJ SOLR
125.0000 mg | Freq: Once | INTRAMUSCULAR | Status: AC
  Administered 2020-09-15: 125 mg via INTRAVENOUS
  Filled 2020-09-15: qty 2

## 2020-09-15 MED ORDER — LACTATED RINGERS IV BOLUS
1000.0000 mL | Freq: Once | INTRAVENOUS | Status: AC
  Administered 2020-09-15: 1000 mL via INTRAVENOUS

## 2020-09-15 MED ORDER — IPRATROPIUM-ALBUTEROL 0.5-2.5 (3) MG/3ML IN SOLN
3.0000 mL | Freq: Once | RESPIRATORY_TRACT | Status: AC
  Administered 2020-09-15: 3 mL via RESPIRATORY_TRACT
  Filled 2020-09-15: qty 3

## 2020-09-15 MED ORDER — PREDNISONE 50 MG PO TABS: 50.0000 mg | ORAL_TABLET | Freq: Every day | ORAL | 0 refills | Status: AC

## 2020-09-15 MED ORDER — NIRMATRELVIR/RITONAVIR (PAXLOVID)TABLET: 2.0000 | ORAL_TABLET | Freq: Two times a day (BID) | ORAL | 0 refills | Status: AC

## 2020-09-15 MED ORDER — KETOROLAC TROMETHAMINE 30 MG/ML IJ SOLN
15.0000 mg | Freq: Once | INTRAMUSCULAR | Status: AC
  Administered 2020-09-15: 15 mg via INTRAVENOUS
  Filled 2020-09-15: qty 1

## 2020-09-15 MED ORDER — DOXYCYCLINE HYCLATE 100 MG PO TABS: 100.0000 mg | ORAL_TABLET | Freq: Two times a day (BID) | ORAL | 0 refills | Status: DC

## 2020-09-15 NOTE — ED Triage Notes (Addendum)
Pt reports diarrhea, body aches, abdominal pain, and chills since yesterday. Pt denies chest pain/SOB. Productive cough with brown sputum. Denies known sick contact.

## 2020-09-15 NOTE — ED Notes (Signed)
E-signature not working at this time. Pt verbalized understanding of D/C instructions, prescriptions and follow up care with no further questions at this time. Pt in NAD and ambulatory at time of D/C.  

## 2020-09-15 NOTE — ED Provider Notes (Signed)
[facility] Emergency Department Provider Note ____________________________________________   Event Date/Time   First MD Initiated Contact with Patient 09/15/20 0725     (approximate)  I have reviewed the triage vital signs and the nursing notes.  HISTORY  Chief Complaint Generalized Body Aches and Cough   HPI Darius [name] is a 50 y.o. malewho presents to the ED for evaluation of myalgias and cough.   Chart review indicates history of diastolic dysfunction, HTN. EtOH and tobacco abuse.   Patient presents to the ED via POV at the direction of his employer for a COVID test, and for evaluation of multiple symptoms.  Patient reports 2 days of myalgias, subjective fevers at home resolved with aspirin, cough with increased sputum production, postprandial abdominal discomfort and watery diarrhea.  Reports concern for pancreatitis due to his history of the same.  Reports no alcoholic beverages for the past 2 days due to his abdominal discomfort postprandially.  Denies abdominal pain now, but reports recurrent episodes of watery diarrhea overnight.  Denies shortness of breath or chest pain.  Reports productive cough 2 days ago and yesterday, but not this morning.  Reports improved respiratory symptoms with his albuterol MDI.  Past Medical History:  Diagnosis Date  . CHF (congestive heart failure) (HCC)   . Diabetes mellitus without complication (HCC)   . Glaucoma   . Hypertension   . Seizures [facility])     Patient Active Problem List   Diagnosis Date Noted  . Shortness of breath 08/15/2020  . Encounter to establish care 07/11/2020  . Abdominal pain 07/11/2020  . HTN (hypertension) 12/30/2017  . Tobacco use 12/30/2017  . Substance use disorder 12/30/2017  . CHF (congestive heart failure) (HCC) 12/18/2017    History reviewed. No pertinent surgical history.  Prior to Admission medications   Medication Sig Start Date End Date Taking? Authorizing Provider   doxycycline (VIBRA-TABS) 100 MG tablet Take 1 tablet (100 mg total) by mouth 2 (two) times daily. 09/15/20  Yes Delton Prairie, MD  predniSONE (DELTASONE) 50 MG tablet Take 1 tablet (50 mg total) by mouth daily for 4 days. 09/15/20 09/19/20 Yes Delton Prairie, MD  albuterol (VENTOLIN HFA) 108 (90 Base) MCG/ACT inhaler Inhale 2 puffs into the lungs every 6 (six) hours as needed for wheezing or shortness of breath. 08/15/20   Iloabachie, Chioma E, NP  aspirin EC 81 MG tablet Take 81 mg by mouth daily. Swallow whole.    [provider]    Allergies Penicillins  History reviewed. No pertinent family history.  Social History Social History   Tobacco Use  . Smoking status: Current Every Day Smoker    Packs/day: 1.00    Years: 35.00    Pack years: 35.00    Types: Cigarettes  . Smokeless tobacco: Never Used  Substance Use Topics  . Alcohol use: Not Currently    Alcohol/week: 6.0 standard drinks    Types: 6 Cans of beer per week    Comment: 32oz beer daily 9/19- has had 3 beers since last visit  . Drug use: Yes    Types: Marijuana, Cocaine, Other-see comments, "Crack" cocaine    Comment:  10/2 last use of crack cocaine 9/29 9/19 last use 3-4 weeks ago (marijuana and cocaine)      Review of Systems  Constitutional: Positive for subjective fever/chills Eyes: No visual changes. ENT: No sore throat. Cardiovascular: Denies chest pain. Respiratory: Denies shortness of breath.  Positive for cough and increased sputum production. Gastrointestinal: Positive for  central abdominal pain without emesis.  Positive for diarrhea. no vomiting. No constipation. Genitourinary: Negative for dysuria. Musculoskeletal: Negative for back pain.  Positive for myalgias Skin: Negative for rash. Neurological: Negative for headaches, focal weakness or numbness.  ____________________________________________   PHYSICAL EXAM:  VITAL SIGNS: Vitals:   09/15/20 0900 09/15/20 0930  BP: (!) 111/51 102/60   Pulse: 66 64  Resp:  18  Temp:    SpO2: 98% 99%    Constitutional: Alert and oriented. Well appearing and in no acute distress.  Supine in bed and conversational.  Appears well. Eyes: Conjunctivae are normal. PERRL. EOMI. Head: Atraumatic. Nose: No congestion/rhinnorhea. Mouth/Throat: Mucous membranes are moist.  Oropharynx non-erythematous. Neck: No stridor. No cervical spine tenderness to palpation. Cardiovascular: Normal rate, regular rhythm. Grossly normal heart sounds.  Good peripheral circulation. Respiratory: Minimal tachypnea to the low 20s, no further evidence of distress.  No retractions.  Decreased air movement throughout with scattered expiratory wheezes. Gastrointestinal: Soft , nondistended. No CVA tenderness. Minimal periumbilical tenderness, otherwise benign. Musculoskeletal: No lower extremity tenderness nor edema.  No joint effusions. No signs of acute trauma. Neurologic:  Normal speech and language. No gross focal neurologic deficits are appreciated. No gait instability noted. Skin:  Skin is warm, dry and intact. No rash noted. Psychiatric: Mood and affect are normal. Speech and behavior are normal. ____________________________________________   LABS (all labs ordered are listed, but only abnormal results are displayed)  Labs Reviewed  RESP PANEL BY RT-PCR (FLU A&B, COVID) ARPGX2 - Abnormal; Notable for the following components:      Result Value   SARS Coronavirus 2 by RT PCR POSITIVE (*)    All other components within normal limits  BASIC METABOLIC PANEL - Abnormal; Notable for the following components:   Sodium 134 (*)    CO2 21 (*)    Glucose, Bld 113 (*)    BUN 22 (*)    Creatinine, Ser 1.54 (*)    GFR, Estimated 54 (*)    All other components within normal limits  CBC - Abnormal; Notable for the following components:   WBC 2.8 (*)    All other components within normal limits  HEPATIC FUNCTION PANEL - Abnormal; Notable for the following components:    AST 44 (*)    Total Bilirubin 1.3 (*)    Indirect Bilirubin 1.2 (*)    All other components within normal limits  TROPONIN I (HIGH SENSITIVITY) - Abnormal; Notable for the following components:   Troponin I (High Sensitivity) 77 (*)    All other components within normal limits  TROPONIN I (HIGH SENSITIVITY) - Abnormal; Notable for the following components:   Troponin I (High Sensitivity) 62 (*)    All other components within normal limits  LIPASE, BLOOD  URINALYSIS, COMPLETE (UACMP) WITH MICROSCOPIC  CBG MONITORING, ED   ____________________________________________  12 Lead EKG  Sinus rhythm, rate of 75 bpm.  Normal axis and intervals.  Lateral and inferior T wave inversions without STEMI criteria.  Stigmata of LVH. ____________________________________________  RADIOLOGY  ED MD interpretation: 2 view CXR reviewed by me with hyperinflation without evidence of acute cardiopulmonary pathology.  Official radiology report(s): DG Chest 2 View  Result Date: 09/15/2020 CLINICAL DATA:  50 year old male with productive cough, shortness of breath. EXAM: CHEST - 2 VIEW COMPARISON:  Chest CT 08/24/2020 and earlier. FINDINGS: Borderline to mild cardiomegaly is stable. Other mediastinal contours are within normal limits. Visualized tracheal air column is within normal limits. Lung markings are stable since 2018.  No pneumothorax, pulmonary edema, pleural effusion or confluent pulmonary opacity. No acute osseous abnormality identified. Negative visible bowel gas pattern. IMPRESSION: No acute cardiopulmonary abnormality. Borderline to mild cardiomegaly. Electronically Signed   By: Odessa Fleming M.D.   On: 09/15/2020 08:24    ____________________________________________   PROCEDURES and INTERVENTIONS  Procedure(s) performed (including Critical Care):  .1-3 Lead EKG Interpretation Performed by: Delton Prairie, MD Authorized by: Delton Prairie, MD     Interpretation: normal     ECG rate:  70   ECG rate  assessment: normal     Rhythm: sinus rhythm     Ectopy: none     Conduction: normal      Medications  lactated ringers bolus 1,000 mL (1,000 mLs Intravenous Bolus 09/15/20 0809)  ipratropium-albuterol (DUONEB) 0.5-2.5 (3) MG/3ML nebulizer solution 3 mL (3 mLs Nebulization Given 09/15/20 0809)  methylPREDNISolone sodium succinate (SOLU-MEDROL) 125 mg/2 mL injection 125 mg (125 mg Intravenous Given 09/15/20 0809)  doxycycline (VIBRA-TABS) tablet 100 mg (100 mg Oral Given 09/15/20 0816)  ketorolac (TORADOL) 30 MG/ML injection 15 mg (15 mg Intravenous Given 09/15/20 0845)    ____________________________________________   MDM / ED COURSE  50 year old smoker with probable COPD presents to the ED with evidence of COPD exacerbation and COVID-19, ultimately amenable to outpatient management.  Normal vitals.  No hypoxia.  Exam with signs of COPD exacerbation and a pink puffer morphology.  No distress, signs of trauma, neurologic or vascular deficits.  Work-up is generally reassuring without evidence of CAP, ACS.  Due to his increased sputum production, he was started on a short course of doxycycline.  Resolution of respiratory symptoms after single DuoNeb and steroids.  He test positive for COVID-19, and he has never been vaccinated.  No evidence of pathology to preclude outpatient management at this time.  Provided prescriptions for prednisone and doxycycline.  Return precautions for the ED were discussed  Clinical Course as of 09/15/20 1014  Sat Sep 15, 2020  0836 Reassessed.  Wife now at bedside.  Patient reports feeling much better after his breathing treatment.  We discussed diagnosis of COPD exacerbation, with increased sputum production requiring short course of antibiotics and steroids.  We discussed pending blood work and COVID swab. [DS]  1011 Reassessed.  Patient reports he continues to feel well.  No hypoxia.  We discussed outpatient management with COVID-19.  We discussed return precautions  and appropriate quarantining at home. [DS]    Clinical Course User Index [DS] Delton Prairie, MD    ____________________________________________   FINAL CLINICAL IMPRESSION(S) / ED DIAGNOSES  Final diagnoses:  COVID-19  COPD with acute exacerbation [facility])     ED Discharge Orders         Ordered    predniSONE (DELTASONE) 50 MG tablet  Daily        09/15/20 1012    doxycycline (VIBRA-TABS) 100 MG tablet  2 times daily        09/15/20 1012           Raedyn Klinck   Note:  This document was prepared using Conservation officer, historic buildings and may include unintentional dictation errors.   Delton Prairie, MD 09/15/20 [phone]

## 2020-09-15 NOTE — Discharge Instructions (Addendum)
You are being discharged with 2 prescriptions. Doxycycline antibiotics to take twice daily for the next 5 days.  We gave the morning dose for today, please pick up this medication and take the evening dose tonight, and continue twice daily tomorrow.  Prednisone steroids.  We gave the dose already for today, please pick up this prescription and take once daily starting tomorrow.  Follow-up with your PCP.  Return to the ED with any further worsening symptoms or chest pains.

## 2020-11-07 ENCOUNTER — Other Ambulatory Visit: Payer: Self-pay

## 2020-11-07 ENCOUNTER — Ambulatory Visit: Payer: Self-pay | Admitting: Pharmacy Technician

## 2020-11-07 DIAGNOSIS — Z79899 Other long term (current) drug therapy: Secondary | ICD-10-CM

## 2020-11-07 NOTE — Progress Notes (Signed)
Patient was scheduled for a phone eligibility appointment on 11/07/20 at 9:00 a.m.  Patient did not answer phone.  Left message.    Patient indicated that he is married on patient intake application.  Did not provide income documentation for spouse.  Patient also indicated that he is unemployed but is living in a boarding house.  Need to verify how he is supporting himself.  In addition, patient needs to complete [employer] Attestation, [facility] Contract and Patient Advocate Form.  No additional medications will be provided without providing requested financial documentation.  Patient notified.  Sherilyn Dacosta Care Manager [facility]   [place] [address] [place], [place]  [address]   November 07, 2020    [facility] [address] [place], [place]  [address]  Dear Darius [name]:  This is to inform you that you are no longer eligible to receive medication assistance at [facility].  The reason(s) are:    _____Your total gross monthly household income exceeds 250% of the Federal Poverty Level.   _____Tangible assets (savings, checking, stocks/bonds, pension, retirement, etc.) exceeds our limit  _____You are eligible to receive benefits from [facility], [facility] or HIV Medication              Assistance Program _____You are eligible to receive benefits from a Medicare Part "D" plan _____You have prescription insurance  _____You are not an [facility] resident __X__Failure to provide all requested documentation (proof of income for 2022 from spouse, and Water engineer, Contract, and Patient Advocate Form).     Medication assistance will resume once all requested documentation has been returned to our clinic.  If you have questions, please contact our clinic at [phone].    Thank you,  [facility]

## 2020-11-15 ENCOUNTER — Other Ambulatory Visit: Payer: Self-pay

## 2020-11-15 ENCOUNTER — Ambulatory Visit: Payer: Self-pay | Attending: Family | Admitting: Family

## 2020-11-15 ENCOUNTER — Encounter: Payer: Self-pay | Admitting: Family

## 2020-11-15 VITALS — BP 120/60 | HR 70 | Resp 18 | Ht 74.0 in | Wt 156.0 lb

## 2020-11-15 DIAGNOSIS — F17210 Nicotine dependence, cigarettes, uncomplicated: Secondary | ICD-10-CM | POA: Insufficient documentation

## 2020-11-15 DIAGNOSIS — R059 Cough, unspecified: Secondary | ICD-10-CM | POA: Insufficient documentation

## 2020-11-15 DIAGNOSIS — I5032 Chronic diastolic (congestive) heart failure: Secondary | ICD-10-CM

## 2020-11-15 DIAGNOSIS — R42 Dizziness and giddiness: Secondary | ICD-10-CM | POA: Insufficient documentation

## 2020-11-15 DIAGNOSIS — F419 Anxiety disorder, unspecified: Secondary | ICD-10-CM | POA: Insufficient documentation

## 2020-11-15 DIAGNOSIS — Z880 Allergy status to penicillin: Secondary | ICD-10-CM | POA: Insufficient documentation

## 2020-11-15 DIAGNOSIS — Z720 Tobacco use: Secondary | ICD-10-CM

## 2020-11-15 DIAGNOSIS — G479 Sleep disorder, unspecified: Secondary | ICD-10-CM | POA: Insufficient documentation

## 2020-11-15 DIAGNOSIS — I110 Hypertensive heart disease with heart failure: Secondary | ICD-10-CM | POA: Insufficient documentation

## 2020-11-15 DIAGNOSIS — I10 Essential (primary) hypertension: Secondary | ICD-10-CM

## 2020-11-15 DIAGNOSIS — Z8616 Personal history of COVID-19: Secondary | ICD-10-CM | POA: Insufficient documentation

## 2020-11-15 DIAGNOSIS — R0602 Shortness of breath: Secondary | ICD-10-CM | POA: Insufficient documentation

## 2020-11-15 NOTE — Progress Notes (Signed)
Patient ID: Darius [name], male    DOB: Mar 27, 1971, 50 y.o.   MRN: [id number]  HPI  Darius [name] is a 50 y/o male with a history of HTN, HF, current tobacco, alcohol and drug use.   Echo report from 12/19/17 reviewed and showed an EF of 60-65% along with mild Darius and mildly elevated PA pressure of 44 mm Hg.   Was in the ED 09/15/20 due to myalgia and weakness. Diagnosed with covid. Evaluated and released.   He presents today for a follow-up visit with a chief complaint of moderate shortness of breath. He describes this as chronic in nature having been present for several years although says that it's worsened since he had covid a few months ago. He has associated fatigue, cough, light-headedness, anxiety and difficulty sleeping along with this. He denies any abdominal distention, palpitations, pedal edema, chest pain or weight gain.   Past Medical History:  Diagnosis Date   CHF (congestive heart failure) (HCC)    Diabetes mellitus without complication (HCC)    Glaucoma    Hypertension    Seizures (HCC)    No past surgical history on file. No family history on file. Social History   Tobacco Use   Smoking status: Every Day    Packs/day: 1.00    Years: 35.00    Pack years: 35.00    Types: Cigarettes   Smokeless tobacco: Never  Substance Use Topics   Alcohol use: Not Currently    Alcohol/week: 6.0 standard drinks    Types: 6 Cans of beer per week    Comment: 32oz beer daily 9/19- has had 3 beers since last visit   Allergies  Allergen Reactions   Penicillins Other (See Comments)    Patient states that mother is highly allergic. 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  Has patient had a PCN reaction causing immediate rash, facial/tongue/throat swelling, SOB or lightheadedness with hypotension: No Has patient had a PCN reaction causing severe rash involving mucus membranes or skin necrosis: No Has patient had a PCN reaction that required hospitalization Yes Has patient  had a PCN reaction occurring within the last 10 years: No If all of the above answers are "N   Prior to Admission medications   Medication Sig Start Date End Date Taking? Authorizing Provider  albuterol (VENTOLIN HFA) 108 (90 Base) MCG/ACT inhaler Inhale 2 puffs into the lungs every 6 (six) hours as needed for wheezing or shortness of breath. 08/15/20  Yes Iloabachie, Chioma E, NP  aspirin EC 81 MG tablet Take 81 mg by mouth daily. Swallow whole.   Yes [provider]    Review of Systems  Constitutional:  Positive for fatigue. Negative for appetite change.  HENT:  Negative for congestion, postnasal drip and sore throat.   Eyes: Negative.   Respiratory:  Positive for cough and shortness of breath (easily). Negative for apnea.   Cardiovascular:  Negative for chest pain, palpitations and leg swelling.  Gastrointestinal:  Negative for abdominal distention and abdominal pain.  Endocrine: Negative.   Genitourinary: Negative.   Musculoskeletal:  Positive for back pain (right lower back). Negative for neck pain.  Skin: Negative.   Allergic/Immunologic: Negative.   Neurological:  Positive for light-headedness (at times). Negative for dizziness.  Hematological:  Negative for adenopathy. Does not bruise/bleed easily.  Psychiatric/Behavioral:  Positive for sleep disturbance (+ snoring). Negative for dysphoric mood. The patient is nervous/anxious.    Vitals:   11/15/20 1334  BP: 120/60  Pulse: 70  Resp: 18  SpO2: 100%  Weight: 156 lb (70.8 kg)  Height: 6\' 2"  (1.88 m)   Wt Readings from Last 3 Encounters:  11/15/20 156 lb (70.8 kg)  08/24/20 158 lb (71.7 kg)  08/15/20 158 lb (71.7 kg)   Lab Results  Component Value Date   CREATININE 1.54 (H) 09/15/2020   CREATININE 1.16 07/18/2020   CREATININE 1.28 (H) 11/23/2019    Physical Exam Vitals reviewed. Exam conducted with a chaperone present (wife).  Constitutional:      Appearance: Normal appearance.  HENT:     Head:  Normocephalic and atraumatic.  Cardiovascular:     Rate and Rhythm: Normal rate and regular rhythm.  Pulmonary:     Effort: Pulmonary effort is normal. No respiratory distress.     Breath sounds: No wheezing or rales.  Abdominal:     General: Abdomen is flat. There is no distension.     Palpations: Abdomen is soft.     Tenderness: There is abdominal tenderness (right upper quadrant).  Musculoskeletal:        General: No tenderness.     Cervical back: Normal range of motion and neck supple.     Right lower leg: No edema.     Left lower leg: No edema.  Skin:    General: Skin is warm and dry.  Neurological:     General: No focal deficit present.     Mental Status: He is alert and oriented to person, place, and time.  Psychiatric:        Mood and Affect: Mood normal.        Behavior: Behavior normal.        Thought Content: Thought content normal.   Assessment & Plan:  1: Chronic heart failure with preserved ejection fraction without structural changes- - NYHA class III - euvolemic today - weighing daily & he was reminded to call for an overnight weight gain of >2 pounds or a weekly weight gain of >5 pounds - weight down 4 pounds from last visit here 6 months ago  - not adding salt to his food & reminded to closely follow a low sodium diet - BNP 12/18/17 was 842.0 - encouraged to follow-up with PCP regarding possible post covid symptoms  2: HTN- - BP looks good today - saw PCP at [facility] 08/19/20; returns next week - BMP on 09/15/20 reviewed and showed sodium 134, potassium 4.0, creatinine 1.54 and GFR 54  3: Tobacco use- - smokes 1/3 ppd of cigarettes  - complete cessation discussed for 3 minutes with him   Medication list reviewed.   Return in 4 months or sooner for any questions/problems before then.

## 2020-11-15 NOTE — Patient Instructions (Signed)
Continue weighing daily and call for an overnight weight gain of > 2 pounds or a weekly weight gain of >5 pounds. 

## 2020-11-16 ENCOUNTER — Ambulatory Visit: Payer: Self-pay | Admitting: Family

## 2020-11-21 ENCOUNTER — Encounter: Payer: Self-pay | Admitting: Gerontology

## 2020-11-21 ENCOUNTER — Ambulatory Visit: Payer: Self-pay | Admitting: Gerontology

## 2020-11-21 ENCOUNTER — Other Ambulatory Visit: Payer: Self-pay

## 2020-11-21 VITALS — BP 138/77 | HR 70 | Temp 96.9°F | Resp 18 | Ht 72.0 in | Wt 160.7 lb

## 2020-11-21 NOTE — Progress Notes (Signed)
Established Patient Office Visit  Subjective:  Patient ID: Darius [name], male    DOB: Nov 26, 1970  Age: 50 y.o. MRN: [id number]  CC:  Chief Complaint  Patient presents with   Follow-up    Patient c/o increased sob and abdominal pain.    HPI Darius [name] is a 50 year old male with a PMH of CHF, Diabetes, Glaucoma and  HTN, presents with complaints of continued shortness of breath with moderate exertion post Covid infection in May 2022. He denies cough, chest pain nor chest tightness. He states that he still smokes and admits desires to quit. He reports that his right upper quadrant pain is much better but not completely resolved since he stopped drinking alcohol.   Low dose CT done on 08/24/20 was negative. Continue annual screening with low-dose chest. CT without contrast in 12 months. Chest Xray done on 09/15/20 showed no acute cardiopulmonary abnormality, borderline to mild cardiomegaly. He was seen at the heart failure clinic on 11/15/20 for his congestive heart failure and will continue to follow up at CHF clinic. He states that he is compliant with his medications and continues to make healthy lifestyle changes. Overall, he states that he is good and offers no further complaints.  Past Medical History:  Diagnosis Date   CHF (congestive heart failure) (HCC)    Diabetes mellitus without complication (HCC)    Glaucoma    Hypertension    Seizures (HCC)     Past Surgical History:  Procedure Laterality Date   NO PAST SURGERIES      Family History  Problem Relation Age of Onset   Hypertension Mother    Kidney disease Mother    Diabetes Mother    Mitral valve prolapse Mother    Diabetes Father    Hypertension Maternal Grandmother    Mitral valve prolapse Maternal Grandmother    Other Half-Brother        accident    Social History   Socioeconomic History   Marital status: Legally Separated    Spouse name: Not on file   Number of children: Not on file   Years of education:  Not on file   Highest education level: Not on file  Occupational History   Occupation: disability  Tobacco Use   Smoking status: Former    Packs/day: 1.00    Years: 35.00    Pack years: 35.00    Types: Cigarettes    Quit date: 08/2020    Years since quitting: 0.2   Smokeless tobacco: Never  Vaping Use   Vaping Use: Every day   Substances: Nicotine, Flavoring  Substance and Sexual Activity   Alcohol use: Not Currently    Alcohol/week: 6.0 standard drinks    Types: 6 Cans of beer per week    Comment: 32oz beer daily 9/19- has had 3 beers since last visit   Drug use: Yes    Types: Marijuana, Cocaine, Other-see comments, "Crack" cocaine    Comment: last use of crack cocaine 01/24/18 and 01/14/18 last use 3-4 weeks ago (marijuana and cocaine)   Sexual activity: Yes  Other Topics Concern   Not on file  Social History Narrative   Not on file   Social Determinants of Health   Financial Resource Strain: Not on file  Food Insecurity: No Food Insecurity   Worried About Running Out of Food in the Last Year: Never true   Ran Out of Food in the Last Year: Never true  Transportation Needs: No Transportation Needs  Lack of Transportation (Medical): No   Lack of Transportation (Non-Medical): No  Physical Activity: Not on file  Stress: Not on file  Social Connections: Not on file  Intimate Partner Violence: Not on file    Outpatient Medications Prior to Visit  Medication Sig Dispense Refill   albuterol (VENTOLIN HFA) 108 (90 Base) MCG/ACT inhaler Inhale 2 puffs into the lungs every 6 (six) hours as needed for wheezing or shortness of breath. 8.5 g 3   aspirin EC 81 MG tablet Take 81 mg by mouth daily. Swallow whole.     No facility-administered medications prior to visit.    Allergies  Allergen Reactions   Penicillins Other (See Comments)    Patient states that mother is highly allergic. 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  Has patient had a PCN reaction  causing immediate rash, facial/tongue/throat swelling, SOB or lightheadedness with hypotension: No Has patient had a PCN reaction causing severe rash involving mucus membranes or skin necrosis: No Has patient had a PCN reaction that required hospitalization Yes Has patient had a PCN reaction occurring within the last 10 years: No If all of the above answers are "N    ROS Review of Systems  Constitutional: Negative.   HENT: Negative.    Respiratory:  Positive for shortness of breath (continues to experience short of breath post Covid infection 3 months ago.Marland Kitchen). Negative for cough and chest tightness.   Cardiovascular: Negative.   Gastrointestinal: Negative.  Negative for abdominal pain.  Genitourinary: Negative.   Skin: Negative.   Neurological:  Negative for dizziness, speech difficulty, numbness and headaches.  Psychiatric/Behavioral: Negative.       Objective:    Physical Exam HENT:     Head: Normocephalic.     Nose: Nose normal.  Cardiovascular:     Rate and Rhythm: Normal rate and regular rhythm.     Pulses: Normal pulses.     Heart sounds: Normal heart sounds. No murmur heard. Pulmonary:     Effort: Pulmonary effort is normal.     Breath sounds: Normal breath sounds.  Abdominal:     General: Bowel sounds are normal.     Palpations: Abdomen is soft.  Skin:    General: Skin is warm and dry.  Neurological:     Mental Status: He is alert and oriented to person, place, and time.  Psychiatric:        Mood and Affect: Mood normal.    BP 138/77 (BP Location: Right Arm, Patient Position: Sitting, Cuff Size: Large)   Pulse 70   Temp (!) 96.9 F (36.1 C)   Resp 18   Ht 6' (1.829 m)   Wt 160 lb 11.2 oz (72.9 kg)   SpO2 98%   BMI 21.79 kg/m  Wt Readings from Last 3 Encounters:  11/21/20 160 lb 11.2 oz (72.9 kg)  11/15/20 156 lb (70.8 kg)  08/24/20 158 lb (71.7 kg)     Health Maintenance Due  Topic Date Due   PNEUMOCOCCAL POLYSACCHARIDE VACCINE AGE 34-64 HIGH RISK   Never done   COVID-19 Vaccine (1) Never done   Pneumococcal Vaccine 68-41 Years old (1 - PCV) Never done   FOOT EXAM  Never done   OPHTHALMOLOGY EXAM  Never done   URINE MICROALBUMIN  Never done   Hepatitis C Screening  Never done   TETANUS/TDAP  Never done   Zoster Vaccines- Shingrix (1 of 2) Never done   COLONOSCOPY (Pts 45-34yr Insurance coverage will  need to be confirmed)  Never done    There are no preventive care reminders to display for this patient.  Lab Results  Component Value Date   TSH 0.758 12/18/2017   Lab Results  Component Value Date   WBC 2.8 (L) 09/15/2020   HGB 15.3 09/15/2020   HCT 44.9 09/15/2020   MCV 92.6 09/15/2020   PLT 181 09/15/2020   Lab Results  Component Value Date   NA 134 (L) 09/15/2020   K 4.0 09/15/2020   CO2 21 (L) 09/15/2020   GLUCOSE 113 (H) 09/15/2020   BUN 22 (H) 09/15/2020   CREATININE 1.54 (H) 09/15/2020   BILITOT 1.3 (H) 09/15/2020   ALKPHOS 56 09/15/2020   AST 44 (H) 09/15/2020   ALT 36 09/15/2020   PROT 7.7 09/15/2020   ALBUMIN 4.6 09/15/2020   CALCIUM 9.2 09/15/2020   ANIONGAP 11 09/15/2020   EGFR 76 07/18/2020   Lab Results  Component Value Date   CHOL 144 07/18/2020   Lab Results  Component Value Date   HDL 52 07/18/2020   Lab Results  Component Value Date   LDLCALC 73 07/18/2020   Lab Results  Component Value Date   TRIG 102 07/18/2020   Lab Results  Component Value Date   CHOLHDL 2.8 07/18/2020   Lab Results  Component Value Date   HGBA1C 4.9 07/18/2020      Assessment & Plan:   1. Shortness of breath Continue Medication regimen He was advised to go to the ED with worsening symptoms   2. Tobacco use Smoking cessation strongly encouraged   Follow-up: Return in about 13 weeks (around 02/20/2021), or if symptoms worsen or fail to improve.    Danella Maiers, RN

## 2020-11-21 NOTE — Progress Notes (Signed)
Patient left without being seen after triage. Patient will reschedule appointment.

## 2020-12-05 ENCOUNTER — Ambulatory Visit: Payer: Self-pay | Admitting: Gerontology

## 2020-12-24 ENCOUNTER — Other Ambulatory Visit
Admission: RE | Admit: 2020-12-24 | Discharge: 2020-12-24 | Disposition: A | Discharge status: Home / Self Care | Payer: Self-pay | Source: Ambulatory Visit | Attending: Family | Admitting: Family

## 2020-12-24 ENCOUNTER — Ambulatory Visit
Admission: RE | Admit: 2020-12-24 | Discharge: 2020-12-24 | Disposition: A | Discharge status: Home / Self Care | Payer: Self-pay | Source: Ambulatory Visit | Attending: Family | Admitting: Family

## 2020-12-24 ENCOUNTER — Other Ambulatory Visit: Payer: Self-pay

## 2020-12-24 ENCOUNTER — Telehealth: Payer: Self-pay | Admitting: Family

## 2020-12-24 ENCOUNTER — Encounter: Payer: Self-pay | Admitting: Family

## 2020-12-24 ENCOUNTER — Ambulatory Visit: Payer: Self-pay | Attending: Family | Admitting: Family

## 2020-12-24 VITALS — BP 162/78 | HR 64 | Resp 18 | Ht 73.0 in | Wt 157.0 lb

## 2020-12-24 DIAGNOSIS — R42 Dizziness and giddiness: Secondary | ICD-10-CM | POA: Insufficient documentation

## 2020-12-24 DIAGNOSIS — R059 Cough, unspecified: Secondary | ICD-10-CM | POA: Insufficient documentation

## 2020-12-24 DIAGNOSIS — I110 Hypertensive heart disease with heart failure: Secondary | ICD-10-CM | POA: Insufficient documentation

## 2020-12-24 DIAGNOSIS — R1031 Right lower quadrant pain: Secondary | ICD-10-CM | POA: Insufficient documentation

## 2020-12-24 DIAGNOSIS — R0989 Other specified symptoms and signs involving the circulatory and respiratory systems: Secondary | ICD-10-CM | POA: Insufficient documentation

## 2020-12-24 DIAGNOSIS — I10 Essential (primary) hypertension: Secondary | ICD-10-CM

## 2020-12-24 DIAGNOSIS — Z720 Tobacco use: Secondary | ICD-10-CM

## 2020-12-24 DIAGNOSIS — R0602 Shortness of breath: Secondary | ICD-10-CM | POA: Insufficient documentation

## 2020-12-24 DIAGNOSIS — F17210 Nicotine dependence, cigarettes, uncomplicated: Secondary | ICD-10-CM | POA: Insufficient documentation

## 2020-12-24 DIAGNOSIS — R197 Diarrhea, unspecified: Secondary | ICD-10-CM | POA: Insufficient documentation

## 2020-12-24 DIAGNOSIS — Z716 Tobacco abuse counseling: Secondary | ICD-10-CM | POA: Insufficient documentation

## 2020-12-24 DIAGNOSIS — Z8616 Personal history of COVID-19: Secondary | ICD-10-CM | POA: Insufficient documentation

## 2020-12-24 DIAGNOSIS — Z880 Allergy status to penicillin: Secondary | ICD-10-CM | POA: Insufficient documentation

## 2020-12-24 DIAGNOSIS — I340 Nonrheumatic mitral (valve) insufficiency: Secondary | ICD-10-CM | POA: Insufficient documentation

## 2020-12-24 DIAGNOSIS — I5032 Chronic diastolic (congestive) heart failure: Secondary | ICD-10-CM | POA: Insufficient documentation

## 2020-12-24 DIAGNOSIS — M549 Dorsalgia, unspecified: Secondary | ICD-10-CM | POA: Insufficient documentation

## 2020-12-24 DIAGNOSIS — Z7982 Long term (current) use of aspirin: Secondary | ICD-10-CM | POA: Insufficient documentation

## 2020-12-24 LAB — BASIC METABOLIC PANEL
Anion gap: 6 (ref 5–15)
BUN: 19 mg/dL (ref 6–20)
CO2: 26 mmol/L (ref 22–32)
Calcium: 9.2 mg/dL (ref 8.9–10.3)
Chloride: 109 mmol/L (ref 98–111)
Creatinine, Ser: 1.19 mg/dL (ref 0.61–1.24)
GFR, Estimated: >60 mL/min (ref >60)
Glucose, Bld: 102 mg/dL — ABNORMAL HIGH (ref 70–99)
Potassium: 4.2 mmol/L (ref 3.5–5.1)
Sodium: 141 mmol/L (ref 135–145)

## 2020-12-24 LAB — HEPATIC FUNCTION PANEL
ALT: 45 U/L — ABNORMAL HIGH (ref 0–44)
AST: 52 U/L — ABNORMAL HIGH (ref 15–41)
Albumin: 3.8 g/dL (ref 3.5–5.0)
Alkaline Phosphatase: 58 U/L (ref 38–126)
Bilirubin, Direct: 0.1 mg/dL (ref 0.0–0.2)
Indirect Bilirubin: 1 mg/dL — ABNORMAL HIGH (ref 0.3–0.9)
Total Bilirubin: 1.1 mg/dL (ref 0.3–1.2)
Total Protein: 6.4 g/dL — ABNORMAL LOW (ref 6.5–8.1)

## 2020-12-24 LAB — MAGNESIUM: Magnesium: 1.8 mg/dL (ref 1.7–2.4)

## 2020-12-24 MED ORDER — FUROSEMIDE 20 MG PO TABS
20.0000 mg | ORAL_TABLET | Freq: Every day | ORAL | 3 refills | Status: DC
Start: 2020-12-24 — End: 2021-07-12

## 2020-12-24 NOTE — Patient Instructions (Signed)
Continue weighing daily and call for an overnight weight gain of > 2 pounds or a weekly weight gain of >5 pounds. 

## 2020-12-24 NOTE — Telephone Encounter (Signed)
Received abdominal xray results obtained earlier today. Patient has multiple renal stones with largest in right midpole of kidney measuring 13 mm.   Called patient to advise of above and explained that this will probably have to be treated with either lithotripsy, surgery or some other procedure to hopefully break the stone apart. Explained that he should present to the ED (patient has no insurance) so that he could receive IVF and pain management. I told him that I didn't know if he would be admitted to have this taken care of or be given pain management with subsequent urology referral.   He says that he will wait until tomorrow morning as his mom can take him instead of going by ambulance tonight. Explained that if his pain worsens or he develops nausea, vomiting, abdominal distention or any other symptoms that he should call 911. Patient verbalized understanding.

## 2020-12-24 NOTE — Progress Notes (Signed)
Lft   Patient ID: Darius [name], male    DOB: Oct 21, 1970, 50 y.o.   MRN: [id number]  HPI  [name] is a 50 y/o male with a history of HTN, HF, current tobacco, alcohol and drug use.   Echo report from 12/19/17 reviewed and showed an EF of 60-65% along with mild MR and mildly elevated PA pressure of 44 mm Hg.   Was in the ED 09/15/20 due to myalgia and weakness. Diagnosed with covid. Evaluated and released.   He presents today for an urgent visit with a chief complaint of moderate shortness of breath upon minimal exertion. He describes this as chronic in nature having been present for several years although says that his breathing has worsened over the last few days or so. He has associated fatigue, cough, abdominal pain, diarrhea, back pain, difficulty sleeping, anxiety and light-headedness along with this.  He denies abdominal distention, palpitations, chest pain or weight gain.   He says that he's developed RLQ pain over the last several days radiating around his side towards his right lower back. No urinary symptoms. Pain has become so bad that he actually called 911 last night, was evaluated and decided to not go to the ED last night.   Past Medical History:  Diagnosis Date   CHF (congestive heart failure) (HCC)    Diabetes mellitus without complication (HCC)    Glaucoma    Hypertension    Seizures (HCC)    Past Surgical History:  Procedure Laterality Date   NO PAST SURGERIES     Family History  Problem Relation Age of Onset   Hypertension Mother    Kidney disease Mother    Diabetes Mother    Mitral valve prolapse Mother    Diabetes Father    Hypertension Maternal Grandmother    Mitral valve prolapse Maternal Grandmother    Other Half-Brother        accident   Social History   Tobacco Use   Smoking status: Former    Packs/day: 1.00    Years: 35.00    Pack years: 35.00    Types: Cigarettes    Quit date: 08/2020    Years since quitting: 0.3   Smokeless tobacco: Never   Substance Use Topics   Alcohol use: Not Currently    Alcohol/week: 6.0 standard drinks    Types: 6 Cans of beer per week    Comment: 32oz beer daily 9/19- has had 3 beers since last visit   Allergies  Allergen Reactions   Penicillins Other (See Comments)    Patient states that mother is highly allergic. 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  Has patient had a PCN reaction causing immediate rash, facial/tongue/throat swelling, SOB or lightheadedness with hypotension: No Has patient had a PCN reaction causing severe rash involving mucus membranes or skin necrosis: No Has patient had a PCN reaction that required hospitalization Yes Has patient had a PCN reaction occurring within the last 10 years: No If all of the above answers are "N   Prior to Admission medications   Medication Sig Start Date End Date Taking? Authorizing Provider  albuterol (VENTOLIN HFA) 108 (90 Base) MCG/ACT inhaler Inhale 2 puffs into the lungs every 6 (six) hours as needed for wheezing or shortness of breath. 08/15/20  Yes Iloabachie, Chioma E, NP  aspirin EC 81 MG tablet Take 81 mg by mouth daily. Swallow whole.   Yes [provider]    Review of Systems  Constitutional:  Positive for fatigue. Negative for appetite change.  HENT:  Negative for congestion, postnasal drip and sore throat.   Eyes: Negative.   Respiratory:  Positive for cough and shortness of breath (easily). Negative for apnea.   Cardiovascular:  Positive for leg swelling (couple of days ago). Negative for chest pain and palpitations.  Gastrointestinal:  Positive for abdominal pain (right lower side) and diarrhea (loose stools). Negative for abdominal distention.  Endocrine: Negative.   Genitourinary:  Negative for difficulty urinating and dysuria.  Musculoskeletal:  Positive for back pain (right lower back). Negative for neck pain.  Skin: Negative.   Allergic/Immunologic: Negative.   Neurological:  Positive for  light-headedness (at times). Negative for dizziness.  Hematological:  Negative for adenopathy. Does not bruise/bleed easily.  Psychiatric/Behavioral:  Positive for sleep disturbance (due to abdominal pain/ shortness of breath). Negative for dysphoric mood. The patient is nervous/anxious.    Vitals:   12/24/20 0936  BP: (!) 162/78  Pulse: 64  Resp: 18  SpO2: 100%  Weight: 157 lb (71.2 kg)  Height: 6\' 1"  (1.854 m)   Wt Readings from Last 3 Encounters:  12/24/20 157 lb (71.2 kg)  11/21/20 160 lb 11.2 oz (72.9 kg)  11/15/20 156 lb (70.8 kg)   Lab Results  Component Value Date   CREATININE 1.54 (H) 09/15/2020   CREATININE 1.16 07/18/2020   CREATININE 1.28 (H) 11/23/2019    Physical Exam Vitals and nursing note reviewed. Exam conducted with a chaperone present (wife).  Constitutional:      Appearance: Normal appearance.  HENT:     Head: Normocephalic and atraumatic.  Cardiovascular:     Rate and Rhythm: Normal rate and regular rhythm.  Pulmonary:     Effort: Pulmonary effort is normal. No respiratory distress.     Breath sounds: Rales (few right lower lobe) present. No wheezing.  Abdominal:     General: Abdomen is flat. There is no distension.     Palpations: Abdomen is soft.     Tenderness: There is abdominal tenderness (right upper quadrant).  Musculoskeletal:        General: No tenderness.     Cervical back: Normal range of motion and neck supple.     Right lower leg: No edema.     Left lower leg: No edema.  Skin:    General: Skin is warm and dry.  Neurological:     General: No focal deficit present.     Mental Status: He is alert and oriented to person, place, and time.  Psychiatric:        Mood and Affect: Mood normal.        Behavior: Behavior normal.        Thought Content: Thought content normal.   Assessment & Plan:  1: Chronic heart failure with preserved ejection fraction without structural changes- - NYHA class III - euvolemic today - weighing daily &  he was reminded to call for an overnight weight gain of >2 pounds or a weekly weight gain of >5 pounds - weight stable from last visit here 5 weeks ago  - will add 20mg  furosemide daily do to rales in RLL - not adding salt to his food & reminded to closely follow a low sodium diet - BNP 12/18/17 was 842.0  2: HTN- - BP elevated (162/78) but he's complaining of a lot of pain in her abdomen.  - saw PCP at [facility] 11/21/20 - BMP on 09/15/20 reviewed and showed sodium 134, potassium  4.0, creatinine 1.54 and GFR 54  3: Tobacco use- - smokes 1/3 ppd of cigarettes  - complete cessation discussed for 3 minutes with him  4: Abdominal pain- - says this started a few days ago and he called 911 last night due to the pain which is also making him short of breath - will check BMP, Mg, hepatic panel today - will get abdominal xray today - sees PCP in 2 days.    Patient did not bring his medications nor a list. Each medication was verbally reviewed with the patient and he was encouraged to bring the bottles to every visit to confirm accuracy of list.   Return in 2 months or sooner depending on how the furosemide is working.

## 2020-12-25 ENCOUNTER — Other Ambulatory Visit: Payer: Self-pay

## 2020-12-25 ENCOUNTER — Emergency Department: Payer: Self-pay

## 2020-12-25 ENCOUNTER — Emergency Department
Admission: EM | Admit: 2020-12-25 | Discharge: 2020-12-25 | Disposition: A | Discharge status: Home / Self Care | Payer: Self-pay | Attending: Emergency Medicine | Admitting: Emergency Medicine

## 2020-12-25 DIAGNOSIS — N200 Calculus of kidney: Secondary | ICD-10-CM | POA: Insufficient documentation

## 2020-12-25 DIAGNOSIS — E119 Type 2 diabetes mellitus without complications: Secondary | ICD-10-CM | POA: Insufficient documentation

## 2020-12-25 DIAGNOSIS — I509 Heart failure, unspecified: Secondary | ICD-10-CM | POA: Insufficient documentation

## 2020-12-25 DIAGNOSIS — I110 Hypertensive heart disease with heart failure: Secondary | ICD-10-CM | POA: Insufficient documentation

## 2020-12-25 DIAGNOSIS — Z87891 Personal history of nicotine dependence: Secondary | ICD-10-CM | POA: Insufficient documentation

## 2020-12-25 DIAGNOSIS — Z7982 Long term (current) use of aspirin: Secondary | ICD-10-CM | POA: Insufficient documentation

## 2020-12-25 LAB — URINALYSIS, COMPLETE (UACMP) WITH MICROSCOPIC
Bacteria, UA: NONE SEEN
Bilirubin Urine: NEGATIVE
Glucose, UA: NEGATIVE mg/dL
Ketones, ur: NEGATIVE mg/dL
Nitrite: NEGATIVE
Protein, ur: 30 mg/dL — AB
RBC / HPF: >50 RBC/hpf — ABNORMAL HIGH (ref 0–5)
Specific Gravity, Urine: 1.024 (ref 1.005–1.030)
pH: 5 (ref 5.0–8.0)

## 2020-12-25 LAB — BASIC METABOLIC PANEL
Anion gap: 7 (ref 5–15)
BUN: 19 mg/dL (ref 6–20)
CO2: 24 mmol/L (ref 22–32)
Calcium: 9 mg/dL (ref 8.9–10.3)
Chloride: 107 mmol/L (ref 98–111)
Creatinine, Ser: 1.32 mg/dL — ABNORMAL HIGH (ref 0.61–1.24)
GFR, Estimated: >60 mL/min (ref >60)
Glucose, Bld: 92 mg/dL (ref 70–99)
Potassium: 3.6 mmol/L (ref 3.5–5.1)
Sodium: 138 mmol/L (ref 135–145)

## 2020-12-25 LAB — CBC
HCT: 40.5 % (ref 39.0–52.0)
Hemoglobin: 14.9 g/dL (ref 13.0–17.0)
MCH: 34 pg (ref 26.0–34.0)
MCHC: 36.8 g/dL — ABNORMAL HIGH (ref 30.0–36.0)
MCV: 92.5 fL (ref 80.0–100.0)
Platelets: 191 10*3/uL (ref 150–400)
RBC: 4.38 MIL/uL (ref 4.22–5.81)
RDW: 11.9 % (ref 11.5–15.5)
WBC: 3.7 10*3/uL — ABNORMAL LOW (ref 4.0–10.5)
nRBC: 0 % (ref 0.0–0.2)

## 2020-12-25 MED ORDER — KETOROLAC TROMETHAMINE 60 MG/2ML IM SOLN
15.0000 mg | Freq: Once | INTRAMUSCULAR | Status: AC
  Administered 2020-12-25: 15 mg via INTRAMUSCULAR
  Filled 2020-12-25: qty 2

## 2020-12-25 NOTE — ED Provider Notes (Signed)
[facility]  ____________________________________________   Event Date/Time   First MD Initiated Contact with Patient 12/25/20 1101     (approximate)  I have reviewed the triage vital signs and the nursing notes.   HISTORY  Chief Complaint Flank Pain    HPI Darius [name] is a 50 y.o. male past medical history of heart failure with preserved ejection fraction, diabetes, hypertension who presents with right-sided flank pain.  Patient notes that over the past 2 to 3 months he has had some intermittent mild pain in the right flank.  This is worsened over the past 3 days.  Pain is sharp, stabbing in nature.  Radiates around to the right side of the abdomen.  No urinary symptoms.  Has had nausea but no vomiting.  Tolerating p.o.  Chills but no fevers.  No prior history of kidney stones.  Patient saw his primary care provider yesterday who ordered an x-ray of the abdomen.  He then was told to come to the ER as he may need lithotripsy.         Past Medical History:  Diagnosis Date   CHF (congestive heart failure) (HCC)    Diabetes mellitus without complication (HCC)    Glaucoma    Hypertension    Seizures (HCC)     Patient Active Problem List   Diagnosis Date Noted   Shortness of breath 08/15/2020   Encounter to establish care 07/11/2020   Abdominal pain 07/11/2020   HTN (hypertension) 12/30/2017   Tobacco use 12/30/2017   Substance use disorder 12/30/2017   CHF (congestive heart failure) (HCC) 12/18/2017    Past Surgical History:  Procedure Laterality Date   NO PAST SURGERIES      Prior to Admission medications   Medication Sig Start Date End Date Taking? Authorizing Provider  albuterol (VENTOLIN HFA) 108 (90 Base) MCG/ACT inhaler Inhale 2 puffs into the lungs every 6 (six) hours as needed for wheezing or shortness of breath. 08/15/20   Iloabachie, Chioma E, NP  aspirin EC 81 MG tablet Take 81 mg by mouth daily. Swallow whole.    [provider]  furosemide (LASIX) 20 MG tablet Take 1 tablet (20 mg total) by mouth daily. 12/24/20 03/24/21  Delma Freeze, FNP    Allergies Penicillins  Family History  Problem Relation Age of Onset   Hypertension Mother    Kidney disease Mother    Diabetes Mother    Mitral valve prolapse Mother    Diabetes Father    Hypertension Maternal Grandmother    Mitral valve prolapse Maternal Grandmother    Other Half-Brother        accident    Social History Social History   Tobacco Use   Smoking status: Former    Packs/day: 1.00    Years: 35.00    Pack years: 35.00    Types: Cigarettes    Quit date: 08/2020    Years since quitting: 0.3   Smokeless tobacco: Never  Vaping Use   Vaping Use: Every day   Substances: Nicotine, Flavoring  Substance Use Topics   Alcohol use: Not Currently    Alcohol/week: 6.0 standard drinks    Types: 6 Cans of beer per week    Comment: 32oz beer daily 9/19- has had 3 beers since last visit   Drug use: Yes    Types: Marijuana, Cocaine, Other-see comments, "Crack" cocaine    Comment: last use of crack cocaine 01/24/18 and 01/14/18 last use 3-4 weeks ago (marijuana  and cocaine)    Review of Systems   Review of Systems  Constitutional:  Positive for chills. Negative for appetite change and fever.  Respiratory:  Positive for shortness of breath.   Cardiovascular:  Negative for chest pain.  Gastrointestinal:  Positive for abdominal pain and nausea. Negative for vomiting.  Genitourinary:  Negative for difficulty urinating, dysuria and hematuria.  Musculoskeletal:  Positive for back pain.  All other systems reviewed and are negative.  Physical Exam Updated Vital Signs BP 112/66   Pulse 72   Temp 97.8 F (36.6 C) (Oral)   Resp 16   Ht 6\' 1"  (1.854 m)   Wt 71.2 kg   SpO2 97%   BMI 20.71 kg/m   Physical Exam Vitals and nursing note reviewed.  Constitutional:      General: He is not in acute distress.    Appearance: Normal appearance.   HENT:     Head: Normocephalic and atraumatic.  Eyes:     General: No scleral icterus.    Conjunctiva/sclera: Conjunctivae normal.  Cardiovascular:     Rate and Rhythm: Normal rate and regular rhythm.  Pulmonary:     Effort: Pulmonary effort is normal. No respiratory distress.     Breath sounds: Normal breath sounds. No wheezing.  Abdominal:     General: There is no distension.     Tenderness: There is no abdominal tenderness. There is right CVA tenderness. There is no guarding.  Musculoskeletal:        General: No deformity or signs of injury.     Cervical back: Normal range of motion.     Right lower leg: No edema.     Left lower leg: No edema.  Skin:    Coloration: Skin is not jaundiced or pale.  Neurological:     General: No focal deficit present.     Mental Status: He is alert and oriented to person, place, and time. Mental status is at baseline.  Psychiatric:        Mood and Affect: Mood normal.        Behavior: Behavior normal.     LABS (all labs ordered are listed, but only abnormal results are displayed)  Labs Reviewed  URINALYSIS, COMPLETE (UACMP) WITH MICROSCOPIC - Abnormal; Notable for the following components:      Result Value   Color, Urine YELLOW (*)    APPearance HAZY (*)    Hgb urine dipstick LARGE (*)    Protein, ur 30 (*)    Leukocytes,Ua SMALL (*)    RBC / HPF >50 (*)    All other components within normal limits  BASIC METABOLIC PANEL - Abnormal; Notable for the following components:   Creatinine, Ser 1.32 (*)    All other components within normal limits  CBC - Abnormal; Notable for the following components:   WBC 3.7 (*)    MCHC 36.8 (*)    All other components within normal limits   ____________________________________________  EKG  N/a ____________________________________________  RADIOLOGY , personally viewed and evaluated these images (plain radiographs) as part of my medical decision making, as well as reviewing  the written report by the radiologist.  ED MD interpretation: CT renal study was obtained which shows intrarenal stones but no obstructing stone    ____________________________________________   PROCEDURES  Procedure(s) performed (including Critical Care):  Procedures   ____________________________________________   INITIAL IMPRESSION / ASSESSMENT AND PLAN / ED COURSE     50 year old male presenting with right-sided flank  pain.  Abdominal x-ray done by primary care provider yesterday showed a 1.3 cm kidney stone.  Patient is overall well-appearing, watching TV in the room.  His vital signs within normal limits.  He does have right CVA tenderness.  Urine does show RBCs, creatinine is at his baseline.  He is mildly leukopenic.  Will get a CT renal study to further characterize the location of the stones and potentially discuss with urology if it is obstructing.  Patient has stones intrarenally but no stone in the ureter or obstructing stone.  Would not expect him to have pain from this.  On reassessment after Toradol patient's pain is much improved and he is requesting food.  I advised that he follow-up with both his primary care provider as well as urology.  Return precautions discussed.  Stable for discharge.  Clinical Course as of 12/25/20 1938  Tue Dec 25, 2020  1109 Creatinine(!): 1.32 [KM]    Clinical Course User Index [KM] Georga Hacking, MD     ____________________________________________   FINAL CLINICAL IMPRESSION(S) / ED DIAGNOSES  Final diagnoses:  Nephrolithiasis     ED Discharge Orders     None        Note:  This document was prepared using Dragon voice recognition software and may include unintentional dictation errors.    Georga Hacking, MD 12/25/20 [phone]

## 2020-12-25 NOTE — ED Triage Notes (Signed)
[place] ED [place] with c/o right flank pain , was seen outpatient radiology for the same yesterday dx with kidney stone, they called him today and told him he will need lithotripsy

## 2020-12-26 ENCOUNTER — Ambulatory Visit (INDEPENDENT_AMBULATORY_CARE_PROVIDER_SITE_OTHER): Payer: Self-pay | Admitting: Urology

## 2020-12-26 ENCOUNTER — Ambulatory Visit: Payer: Self-pay | Admitting: Gerontology

## 2020-12-26 ENCOUNTER — Encounter: Payer: Self-pay | Admitting: Urology

## 2020-12-26 VITALS — BP 138/67 | HR 67 | Temp 97.2°F | Ht 73.0 in | Wt 157.0 lb

## 2020-12-26 DIAGNOSIS — N23 Unspecified renal colic: Secondary | ICD-10-CM

## 2020-12-26 DIAGNOSIS — N200 Calculus of kidney: Secondary | ICD-10-CM

## 2020-12-26 MED ORDER — KETOROLAC TROMETHAMINE 10 MG PO TABS: 10.0000 mg | ORAL_TABLET | Freq: Four times a day (QID) | ORAL | 0 refills | Status: DC | PRN

## 2020-12-26 NOTE — Progress Notes (Signed)
12/26/2020 9:49 AM   Woodard Takach 04-17-1971 [id number]  Referring provider: Rolm Gala, NP [address] [place],  [place] [address]  Chief Complaint  Patient presents with   Nephrolithiasis    HPI: Darius [name] is a 50 y.o. male who presents for recent ED follow-up for nephrolithiasis  Saw PCP 12/24/2020 with complaints of shortness of breath and right lower quadrant abdominal pain for the past several days and intermittent for the past 2-3 months KUB was ordered which showed multiple right renal calculi Presented to ED yesterday with complaints of right flank pain radiating to the RLQ + Nausea without vomiting; no fever No bothersome LUTS No prior history stone disease Stone protocol CT performed which showed a right renal pelvic calculus measuring 15 mm and a nonobstructing 7 mm right lower pole calculus.  No hydronephrosis noted  PMH: Past Medical History:  Diagnosis Date   CHF (congestive heart failure) (HCC)    Diabetes mellitus without complication (HCC)    Glaucoma    Hypertension    Seizures (HCC)     Surgical History: Past Surgical History:  Procedure Laterality Date   NO PAST SURGERIES      Home Medications:  Allergies as of 12/26/2020       Reactions   Penicillins Other (See Comments)   Patient states that mother is highly allergic. 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 Has patient had a PCN reaction causing immediate rash, facial/tongue/throat swelling, SOB or lightheadedness with hypotension: No Has patient had a PCN reaction causing severe rash involving mucus membranes or skin necrosis: No Has patient had a PCN reaction that required hospitalization Yes Has patient had a PCN reaction occurring within the last 10 years: No If all of the above answers are "N        Medication List        Accurate as of December 26, 2020  9:49 AM. If you have any questions, ask your nurse or doctor.          aspirin EC  81 MG tablet Take 81 mg by mouth daily. Swallow whole.   furosemide 20 MG tablet Commonly known as: LASIX Take 1 tablet (20 mg total) by mouth daily.   ProAir HFA 108 (90 Base) MCG/ACT inhaler Generic drug: albuterol Inhale 2 puffs into the lungs every 6 (six) hours as needed for wheezing or shortness of breath.        Allergies:  Allergies  Allergen Reactions   Penicillins Other (See Comments)    Patient states that mother is highly allergic. 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  Has patient had a PCN reaction causing immediate rash, facial/tongue/throat swelling, SOB or lightheadedness with hypotension: No Has patient had a PCN reaction causing severe rash involving mucus membranes or skin necrosis: No Has patient had a PCN reaction that required hospitalization Yes Has patient had a PCN reaction occurring within the last 10 years: No If all of the above answers are "N    Family History: Family History  Problem Relation Age of Onset   Hypertension Mother    Kidney disease Mother    Diabetes Mother    Mitral valve prolapse Mother    Diabetes Father    Hypertension Maternal Grandmother    Mitral valve prolapse Maternal Grandmother    Other Half-Brother        accident    Social History:  reports that he quit smoking  about 4 months ago. His smoking use included cigarettes. He has a 35.00 pack-year smoking history. He has never used smokeless tobacco. He reports that he does not currently use alcohol after a past usage of about 6.0 standard drinks per week. He reports current drug use. Drugs: Marijuana, Cocaine, Other-see comments, and "Crack" cocaine.   Physical Exam: BP 138/67   Pulse 67   Temp (!) 97.2 F (36.2 C)   Ht 6\' 1"  (1.854 m)   Wt 157 lb (71.2 kg)   BMI 20.71 kg/m   Constitutional:  Alert and oriented, No acute distress. HEENT: [place] AT, moist mucus membranes.  Trachea midline, no masses. Cardiovascular: RRR Respiratory: Normal  respiratory effort, lungs clear GI: Abdomen is soft, nontender, nondistended, no abdominal masses GU: No CVA tenderness Lymph: No cervical or inguinal lymphadenopathy. Skin: No rashes, bruises or suspicious lesions. Neurologic: Grossly intact, no focal deficits, moving all 4 extremities. Psychiatric: Normal mood and affect.  Laboratory Data: Lab Results  Component Value Date   WBC 3.7 (L) 12/25/2020   HGB 14.9 12/25/2020   HCT 40.5 12/25/2020   MCV 92.5 12/25/2020   PLT 191 12/25/2020    Lab Results  Component Value Date   CREATININE 1.32 (H) 12/25/2020    Urinalysis Dipstick 3+ blood/trace leukocytes Microscopy >30 RBC   Pertinent Imaging: KUB/CT images were personally reviewed and interpreted  DG Abd 1 View  Narrative CLINICAL DATA:  Abdominal pain  EXAM: ABDOMEN - 1 VIEW  COMPARISON:  CT 10/03/2015  FINDINGS: Multiple right renal stones noted, the largest in the midpole measuring 13 mm. No visible calcifications of the left kidney or expected course of the ureters. Nonobstructive bowel gas pattern. No organomegaly or free air.  IMPRESSION: Right nephrolithiasis.  No acute findings.    CT Renal Stone Study  Narrative CLINICAL DATA:  Flank pain, kidney stone suspected  EXAM: CT ABDOMEN AND PELVIS WITHOUT CONTRAST  TECHNIQUE: Multidetector CT imaging of the abdomen and pelvis was performed following the standard protocol without IV contrast.  COMPARISON:  August 29  FINDINGS: Inferior chest: The lung bases are well-aerated.  Hepatobiliary: The liver is normal in size without focal abnormality. No intrahepatic or extrahepatic biliary ductal dilation. The gallbladder appears normal.  Spleen: Normal in size without focal abnormality.  Pancreas: No pancreatic ductal dilatation or surrounding inflammatory changes.  Adrenals/Urinary Tract: Adrenal glands are unremarkable. There is a large calculus in the right renal pelvis measuring up to 15  mm. There is a second nonobstructing renal calculus in the right lower pole measuring up to 7 mm. No associated hydronephrosis. Punctate nonobstructive renal calculus in the left kidney. Bladder is unremarkable.  Stomach/Bowel: The stomach, small bowel and large bowel are normal in caliber without abnormal wall thickening or surrounding inflammatory changes.  Reproductive: Prostate is unremarkable.  Lymphatic: No enlarged lymph nodes in the abdomen or pelvis.  Vasculature: The abdominal aorta is normal in caliber.  Other: No abdominopelvic ascites.  Musculoskeletal: No aggressive osseous lesions. The soft tissues are unremarkable.  IMPRESSION: There is a large right renal stone measuring up to 15 mm in the right renal pelvis without associated hydronephrosis. There is a second smaller nonobstructive stone in the right lower pole measuring up to 7 mm.  Punctate nonobstructive left nephrolithiasis.   Electronically Signed By: August 31 M.D. On: 12/25/2020 12:05   Assessment & Plan:    1.  Right nephrolithiasis 15 mm right renal pelvic calculus.  No hydronephrosis no stone in the position that  could be a source of pain.  Based on stone size and location treatment recommended We discussed various treatment options for urolithiasis including observation with or without medical expulsive therapy, shockwave lithotripsy (SWL), ureteroscopy and laser lithotripsy with stent placement, and percutaneous nephrolithotomy. We discussed that management is based on stone size, location, density, patient co-morbidities, and patient preference.  Stones <75mm in size have a >80% spontaneous passage rate. Data surrounding the use of tamsulosin for medical expulsive therapy is controversial, but meta analyses suggests it is most efficacious for distal stones between 5-39mm in size. Possible side effects include dizziness/lightheadedness, and retrograde ejaculation. SWL has a lower stone free  rate in a single procedure, but also a lower complication rate compared to ureteroscopy and avoids a stent and associated stent related symptoms. Possible complications include renal hematoma, steinstrasse, and need for additional treatment. Ureteroscopy with laser lithotripsy and stent placement has a higher stone free rate than SWL in a single procedure, however increased complication rate including possible infection, ureteral injury, bleeding, and stent related morbidity. Common stent related symptoms include dysuria, urgency/frequency, and flank pain. PCNL is the favored treatment for stones >2cm. It involves a small incision in the flank, with complete fragmentation of stones and removal. It has the highest stone free rate, but also the highest complication rate. Possible complications include bleeding, infection/sepsis, injury to surrounding organs including the pleura, and collecting system injury.  Based on stone density (>1000 HU) and burden would not recommend SWL After an extensive discussion of the risks and benefits of the above treatment options, the patient would like to proceed with ureteroscopy. Rx ketorolac 10 mg every 6 hours sent to pharmacy    Riki Altes, MD  [facility] [address] [place], [place] [address] [phone]

## 2020-12-27 ENCOUNTER — Encounter: Payer: Self-pay | Admitting: Urology

## 2020-12-28 LAB — URINALYSIS, COMPLETE
Bilirubin, UA: NEGATIVE
Glucose, UA: NEGATIVE
Ketones, UA: NEGATIVE
Nitrite, UA: NEGATIVE
Specific Gravity, UA: 1.02 (ref 1.005–1.030)
Urobilinogen, Ur: 1 mg/dL (ref 0.2–1.0)
pH, UA: 5.5 (ref 5.0–7.5)

## 2020-12-28 LAB — MICROSCOPIC EXAMINATION
Bacteria, UA: NONE SEEN
RBC, Urine: >30 /hpf — AB (ref 0–2)

## 2021-01-02 ENCOUNTER — Ambulatory Visit: Payer: Self-pay | Admitting: Gerontology

## 2021-01-02 ENCOUNTER — Telehealth: Payer: Self-pay | Admitting: *Deleted

## 2021-01-02 ENCOUNTER — Telehealth: Payer: Self-pay

## 2021-01-02 ENCOUNTER — Other Ambulatory Visit: Payer: Self-pay

## 2021-01-02 DIAGNOSIS — N200 Calculus of kidney: Secondary | ICD-10-CM

## 2021-01-02 NOTE — Telephone Encounter (Signed)
REQUEST FOR CLEARANCE       Date: 01/02/21  Faxed to: [phone]  Surgeon: Irineo Axon, MD     Date of Surgery:01/08/21  Operation: Right URS w/LL and stent placement  Diagnosis:Right Nephrolithasis  Patient Requires:   Cardiac/ Medical Clearance : Yes  Reason: History of CHF and HNT      Risk Assessment:    Low YES/ NO  Moderate YES/ NO   High YES/ NO         This patient is optimized for surgery. YES/ NO   I recommend further assessment/workup prior to surgery. Yes/ NO  Appointment scheduled for: _______________________   Further recommendations: ____________________________________   ____________________________________________________________    Physician Signature: ___________________________________   Printed Name: ________________________________________   Date: ________________

## 2021-01-02 NOTE — Progress Notes (Signed)
[facility] Posting Form      Inpt ( No  )   Outpt (Yes)   Obs ( No  )   Diagnosis: Right Nephrolithiasis  -CPT: 52356  Surgery: Right URS w/LL Stone removal, Right stent placement  Surgeon: Irineo Axon, MD  Surgery Date/Time: Date: 01/08/21  Surgery Location: Day Surgery    Cardiac/Medical/Pulmonary Clearance needed: Yes HX of CHF, HTN *patient states he stopped ASA 1 week ago  Clearance needed from: Clarisa Kindred, FNP  Clearance request sent on: Date: @TODAY @   *Orders entered into EPIC  Date: @TODAY @   *Case booked in  Date: @TODAY @  *Notified pt of Surgery: Date: @TODAY @ - UA & CX : 01/03/21  *Placed into Prior Authorization Work [place] Date: @TODAY @

## 2021-01-02 NOTE — Telephone Encounter (Signed)
Spoke with patient today and agreed upon surgical date of 01/08/21.  Right URS w/LL & Stent placement per Dr. Lonna Cobb. Patient will drop off a pre-op urine culture to the office tomorrow apt was made. Patient has a Hx of CHF & HTN (patient states he stopped ASA 81mg  1 week ago) Cardiac Clearance will be sent to , FNP. Surgical Information was discussed in detail with patient and reminders were sent via my chart per his request.  Awaiting Clearance Response

## 2021-01-02 NOTE — Telephone Encounter (Signed)
Patient called in yesterday and states he wanted surgery scheduled. I told the patient that the surgery scheduler will be call this week. He states he has not been to work. I advised patient that he could go to work. He stated he is weak . Than he stated that he has passed out . I advised him if he has been passing out he needs to go to hospital. He stated already went to hospital last week they sent him home.

## 2021-01-03 ENCOUNTER — Other Ambulatory Visit: Payer: Self-pay

## 2021-01-03 ENCOUNTER — Encounter: Payer: Self-pay | Admitting: Urgent Care

## 2021-01-03 NOTE — Telephone Encounter (Signed)
Clearance was received from [facility], FNP and scanned under media.

## 2021-01-04 ENCOUNTER — Other Ambulatory Visit: Payer: Self-pay

## 2021-01-04 DIAGNOSIS — N200 Calculus of kidney: Secondary | ICD-10-CM

## 2021-01-04 LAB — URINALYSIS, COMPLETE
Bilirubin, UA: NEGATIVE
Leukocytes,UA: NEGATIVE
Nitrite, UA: NEGATIVE
Specific Gravity, UA: >1.03 — ABNORMAL HIGH (ref 1.005–1.030)
Urobilinogen, Ur: 1 mg/dL (ref 0.2–1.0)
pH, UA: 5.5 (ref 5.0–7.5)

## 2021-01-04 LAB — MICROSCOPIC EXAMINATION: RBC, Urine: >30 /hpf — AB (ref 0–2)

## 2021-01-07 ENCOUNTER — Encounter
Admission: RE | Admit: 2021-01-07 | Discharge: 2021-01-07 | Disposition: A | Discharge status: Home / Self Care | Payer: Self-pay | Source: Ambulatory Visit | Attending: Urology | Admitting: Urology

## 2021-01-07 ENCOUNTER — Other Ambulatory Visit: Payer: Self-pay

## 2021-01-07 ENCOUNTER — Encounter: Payer: Self-pay | Admitting: Urology

## 2021-01-07 HISTORY — DX: Chronic obstructive pulmonary disease, unspecified: J44.9

## 2021-01-07 NOTE — Progress Notes (Signed)
Perioperative Services  Pre-Admission/Anesthesia Testing Clinical Review  Date: 01/07/21  Patient Demographics:  Name: Darius [name] DOB:   1970/09/10 MRN:   [id number]  Planned Surgical Procedure(s):    Case: [id number] Date/Time: 01/08/21 1214   Procedure: CYSTOSCOPY/URETEROSCOPY/HOLMIUM LASER/STENT PLACEMENT (Right)   Anesthesia type: General   Pre-op diagnosis: Right Nephrolithiasis   Location: [facility] OR ROOM 10 / [facility]   Surgeons: Riki Altes, MD     NOTE: Available PAT nursing documentation and vital signs have been reviewed. Clinical nursing staff has updated patient's PMH/PSHx, current medication list, and drug allergies/intolerances to ensure comprehensive history available to assist in medical decision making as it pertains to the aforementioned surgical procedure and anticipated anesthetic course. Extensive review of available clinical information performed. Darius [name] PMH and PSHx updated with any diagnoses/procedures that  may have been inadvertently omitted during his intake with the pre-admission testing department's nursing staff.  Clinical Discussion:  Darius [name] is a 51 y.o. male who is submitted for pre-surgical anesthesia review and clearance prior to him undergoing the above procedure. Patient is a Former Smoker (35 pack years; quit 08/2020). Pertinent PMH includes: CHF, valvular heart disease, LVH, HTN, T2DM, DOE, COPR, seizures, history of polysubstance abuse (ETOH, marijuana, cocaine), glaucoma, nephrolithiasis.   Patient is followed by cardiology Bing Neighbors, FNP-C). He was last seen in the cardiology clinic on 12/24/2020; notes reviewed.  At the time of his clinic visit, patient reporting acute worsening of his chronic exertional dyspnea over the course of the preceding few days.  Patient with fatigue, cough, abdominal pain, diarrhea, back pain, anxiety, and vertiginous symptoms associated with his breathing difficulties.  He denied any  episodes of chest pain, palpitations, PND, orthopnea, significant peripheral edema, or presyncope/syncope.  Patient was also complaining of pain in the RIGHT lower quadrant of his abdomen with (+) radiation into his flank and lower back.  PMH significant for cardiovascular diagnoses.  TTE performed on 0 24 2019 revealed normal left ventricular systolic function with moderate concentric LVH.  Ejection fraction normal at 60 to 65%.  There were no regional wall motion abnormalities.  There was moderate aortic valve and mild mitral valve regurgitation.  Left atrium was mildly dilated.  PASP with mild to moderate elevation at 44 mmHg.  Blood pressure elevated at 162/78; taking no interventions. Exam revealed RLL rales; started on daily loop diuretic therapy (furosemide 20 mg).  Of note, patient with complaints of significant pain at that time, thus accounting for his elevated blood pressures.  Patient is not on any type of lipid-lowering therapies for ASCVD prevention.  T2DM well controlled with diet lifestyle modification; last Hgb A1c 4.9% when checked on 07/18/2020.  Despite shortness of breath, patient still felt to be able to achieve at least 4 METS of activity.  Due to patient's complaints of pain in his abdomen, he was sent for diagnostic radiographs of his abdomen, which revealed a 13 mm RIGHT nephrolithiasis.  Patient was referred to the ED.  No other changes were made to his medication regimen.  Patient to follow-up with outpatient cardiology/heart failure clinic in 2 months or sooner if needed.  Darius [name] is scheduled to undergo cystoscopy, ureteroscopy, laser lithotripsy, and ureteral stent placement on 01/08/2021 with Dr. Irineo Axon, MD.  Given patient's past medical history significant for cardiovascular diagnoses, presurgical cardiac clearance was sought by the performing surgeon's office and PAT team. Per cardiology, "this patient is optimized for surgery and may proceed with the planned  procedural course  with a LOW risk of significant perioperative cardiovascular complications".  This patient is on daily antiplatelet therapy.  He has been instructed on recommendations on holding his daily low-dose ASA for 1 week prior to his procedure with plans to restart as soon as postoperative bleeding risk felt to be minimized by his primary attending surgeon.  Patient denies previous perioperative complications with anesthesia in the past.  In review his EMR, there are no records available for review pertaining to past procedural/anesthetic courses within the [facility].  Vitals with BMI 01/07/2021 12/26/2020 12/25/2020  Height 6\' 1"  6\' 1"  -  Weight 158 lbs 157 lbs -  BMI 20.85 20.72 -  Systolic - 138 112  Diastolic - 67 66  Pulse - 67 72    Providers/Specialists:   NOTE: Primary physician provider listed below. Patient may have been seen by APP or partner within same practice.   PROVIDER ROLE / SPECIALTY LAST , MD Urology (Surgeon) 12/26/2020  Winfield Cunas, NP Primary Care Provider 11/21/2020  Rolm Gala, FNP-C Cardiology 12/24/2020   Allergies:  Penicillins  Current Home Medications:   No current facility-administered medications for this encounter.    albuterol (VENTOLIN HFA) 108 (90 Base) MCG/ACT inhaler   furosemide (LASIX) 20 MG tablet   aspirin EC 81 MG tablet   ketorolac (TORADOL) 10 MG tablet   History:   Past Medical History:  Diagnosis Date   CHF (congestive heart failure) (HCC)    a.) TTE on 12/19/2017 --> LVEF 60-65%, no RWMAs, mild LA dilitation, PASP 44 mmHg   COPD (chronic obstructive pulmonary disease) (HCC)    DOE (dyspnea on exertion)    Glaucoma    History of 2019 novel coronavirus disease (COVID-19) 09/15/2020   Hypertension    Nephrolithiasis    Polysubstance abuse (HCC)    ETOH, marijuana, cocaine   Seizures (HCC)    T2DM (type 2 diabetes mellitus) (HCC)    a.)  Controlled with diet lifestyle  modifications   Valvular heart disease    a.) TTE on 12/19/2017 --> mild MV regurgitation; moderate AV regurgitation   Past Surgical History:  Procedure Laterality Date   NO PAST SURGERIES     Family History  Problem Relation Age of Onset   Hypertension Mother    Kidney disease Mother    Diabetes Mother    Mitral valve prolapse Mother    Diabetes Father    Hypertension Maternal Grandmother    Mitral valve prolapse Maternal Grandmother    Other Half-Brother        accident   Social History   Tobacco Use   Smoking status: Former    Packs/day: 1.00    Years: 35.00    Pack years: 35.00    Types: Cigarettes    Quit date: 08/2020    Years since quitting: 0.3   Smokeless tobacco: Never  Vaping Use   Vaping Use: Some days   Substances: Nicotine, Flavoring  Substance Use Topics   Alcohol use: Yes    Alcohol/week: 6.0 standard drinks    Types: 6 Cans of beer per week   Drug use: Yes    Types: Marijuana, Cocaine, Other-see comments, "Crack" cocaine    Comment: last use of crack cocaine 01/24/18 and 01/14/18 last use 3-4 weeks ago (marijuana and cocaine)    Pertinent Clinical Results:  LABS: Labs reviewed: Acceptable for surgery.  Lab Results  Component Value Date   WBC 3.7 (L) 12/25/2020   HGB 14.9  12/25/2020   HCT 40.5 12/25/2020   MCV 92.5 12/25/2020   PLT 191 12/25/2020   Lab Results  Component Value Date   NA 138 12/25/2020   K 3.6 12/25/2020   CO2 24 12/25/2020   GLUCOSE 92 12/25/2020   BUN 19 12/25/2020   CREATININE 1.32 (H) 12/25/2020   CALCIUM 9.0 12/25/2020   GFRNONAA >60 12/25/2020   GFRAA >60 11/23/2019   No visits with results within 3 Day(s) from this visit.  Latest known visit with results is:  Appointment on 01/04/2021  Component Date Value Ref Range Status   Specific Gravity, UA 01/04/2021 >1.030 (A) 1.005 - 1.030 Final   pH, UA 01/04/2021 5.5  5.0 - 7.5 Final   Color, UA 01/04/2021 Brown (A) Yellow Final   Appearance Ur 01/04/2021 Cloudy  (A) Clear Final   Leukocytes,UA 01/04/2021 Negative  Negative Final   Protein,UA 01/04/2021 2+ (A) Negative/Trace Final   Glucose, UA 01/04/2021 Trace (A) Negative Final   Ketones, UA 01/04/2021 Trace (A) Negative Final   RBC, UA 01/04/2021 3+ (A) Negative Final   Bilirubin, UA 01/04/2021 Negative  Negative Final   Urobilinogen, Ur 01/04/2021 1.0  0.2 - 1.0 mg/dL Final   Nitrite, UA 66/09/3014 Negative  Negative Final   Microscopic Examination 01/04/2021 See below:   Final   WBC, UA 01/04/2021 11-30 (A) 0 - 5 /hpf Final   RBC 01/04/2021 >30 (A) 0 - 2 /hpf Final   Epithelial Cells (non renal) 01/04/2021 0-10  0 - 10 /hpf Final   Casts 01/04/2021 Present (A) None seen /lpf Final   Cast Type 01/04/2021 Hyaline casts  N/A Final   Mucus, UA 01/04/2021 Present (A) Not Estab. Final   Bacteria, UA 01/04/2021 Few  None seen/Few Final    ECG: Date: 09/15/2020 Time ECG obtained: 0657 AM Rate: 75 bpm Rhythm: normal sinus Axis (leads I and aVF): Normal Intervals: PR 136 ms. QRS 80 ms. QTc 455 ms. ST segment and T wave changes: Inferolateral TWIs.  Biatrial enlargement.  LVH. Comparison: Similar to previous tracing obtained on 11/15/2019   IMAGING / PROCEDURES: TRANSTHORACIC ECHOCARDIOGRAM performed on 05/21/2017 Normal left ventricular systolic function with an EF of 60-65% Left ventricular cavity size normal Mild to moderate concentric LVH No regional wall motion abnormalities Ventricular diastolic parameters within normal range Mild to moderate aortic valve regurgitation Mild mitral valve regurgitation Mildly dilated left atrium Right ventricular systolic function normal PASP mildly to moderately elevated at 44 mmHg  Impression and Plan:  Darius [name] has been referred for pre-anesthesia review and clearance prior to him undergoing the planned anesthetic and procedural courses. Available labs, pertinent testing, and imaging results were personally reviewed by me. This patient has  been appropriately cleared by cardiology with an overall LOW risk of significant perioperative cardiovascular complications.  Based on clinical review performed today (01/07/21), barring any significant acute changes in the patient's overall condition, it is anticipated that he will be able to proceed with the planned surgical intervention. Any acute changes in clinical condition may necessitate his procedure being postponed and/or cancelled. Patient will meet with anesthesia team (MD and/or CRNA) on the day of his procedure for preoperative evaluation/assessment. Questions regarding anesthetic course will be fielded at that time.   Pre-surgical instructions were reviewed with the patient during his PAT appointment and questions were fielded by PAT clinical staff. Patient was advised that if any questions or concerns arise prior to his procedure then he should return a call to PAT and/or his  surgeon's office to discuss.  Quentin Mulling, MSN, APRN, FNP-C, CEN Darius [name]  Peri-operative Services Nurse Practitioner Phone: [phone] Fax: [phone] 01/07/21 10:15 AM  NOTE: This note has been prepared using Dragon dictation software. Despite my best ability to proofread, there is always the potential that unintentional transcriptional errors may still occur from this process.

## 2021-01-07 NOTE — Patient Instructions (Signed)
Your procedure is scheduled on: 01/08/21 Report to DAY SURGERY DEPARTMENT LOCATED ON 2ND FLOOR MEDICAL MALL ENTRANCE. To find out your arrival time please call [phone] between 1PM - 3PM on 01/07/21.  Remember: Instructions that are not followed completely may result in serious medical risk, up to and including death, or upon the discretion of your surgeon and anesthesiologist your surgery may need to be rescheduled.     _X__ 1. Do not eat food after midnight the night before your procedure.                 No gum chewing or hard candies.   __X__2.  On the morning of surgery brush your teeth with toothpaste and water, you                 may rinse your mouth with mouthwash if you wish.  Do not swallow any              toothpaste of mouthwash.     _X__ 3.  No Alcohol for 24 hours before or after surgery.   _X__ 4.  Do Not Smoke or use e-cigarettes For 24 Hours Prior to Your Surgery.                 Do not use any chewable tobacco products for at least 6 hours prior to                 surgery.  ____  5.  Bring all medications with you on the day of surgery if instructed.   __X__  6.  Notify your doctor if there is any change in your medical condition      (cold, fever, infections).     Do not wear jewelry, make-up, hairpins, clips or nail polish. Do not wear lotions, powders, or perfumes.  Do not shave 48 hours prior to surgery. Men may shave face and neck. Do not bring valuables to the hospital.    [facility] is not responsible for any belongings or valuables.  Contacts, dentures/partials or body piercings may not be worn into surgery. Bring a case for your contacts, glasses or hearing aids, a denture cup will be supplied. Leave your suitcase in the car. After surgery it may be brought to your room. For patients admitted to the hospital, discharge time is determined by your treatment team.   Patients discharged the day of surgery will not be allowed to drive home.   Please  read over the following fact sheets that you were given:     __X__ Take these medicines the morning of surgery with A SIP OF WATER:    1. NONE  2.   3.   4.  5.  6.  ____ Fleet Enema (as directed)   __X__ Use CHG Soap/SAGE wipes as directed  ____ Use inhalers on the day of surgery  ____ Stop metformin/Janumet/Farxiga 2 days prior to surgery    ____ Take 1/2 of usual insulin dose the night before surgery. No insulin the morning          of surgery.   ____ Stop Blood Thinners Coumadin/Plavix/Xarelto/Pleta/Pradaxa/Eliquis/Effient/Aspirin  on   Or contact your Surgeon, Cardiologist or Medical Doctor regarding  ability to stop your blood thinners  __X__ Stop Anti-inflammatories 7 days before surgery such as Advil, Ibuprofen, Motrin,  BC or Goodies Powder, Naprosyn, Naproxen, Aleve, Aspirin    __X__ Stop all herbal supplements, fish oil or vitamin E until after surgery.  ____ Bring C-Pap to the hospital.

## 2021-01-08 ENCOUNTER — Encounter
Admission: RE | Disposition: A | Discharge status: Home / Self Care | Payer: Self-pay | Source: Home / Self Care | Attending: Urology

## 2021-01-08 ENCOUNTER — Encounter: Payer: Self-pay | Admitting: Urology

## 2021-01-08 ENCOUNTER — Ambulatory Visit
Admission: RE | Admit: 2021-01-08 | Discharge: 2021-01-08 | Disposition: A | Discharge status: Home / Self Care | Payer: Self-pay | Attending: Urology | Admitting: Urology

## 2021-01-08 DIAGNOSIS — N200 Calculus of kidney: Secondary | ICD-10-CM | POA: Insufficient documentation

## 2021-01-08 DIAGNOSIS — Z538 Procedure and treatment not carried out for other reasons: Secondary | ICD-10-CM | POA: Insufficient documentation

## 2021-01-08 DIAGNOSIS — Z01812 Encounter for preprocedural laboratory examination: Secondary | ICD-10-CM | POA: Insufficient documentation

## 2021-01-08 HISTORY — DX: Endocarditis, valve unspecified: I38

## 2021-01-08 HISTORY — DX: Other forms of dyspnea: R06.09

## 2021-01-08 HISTORY — DX: Calculus of kidney: N20.0

## 2021-01-08 HISTORY — DX: Dyspnea, unspecified: R06.00

## 2021-01-08 HISTORY — DX: Type 2 diabetes mellitus without complications: E11.9

## 2021-01-08 HISTORY — DX: Other psychoactive substance abuse, uncomplicated: F19.10

## 2021-01-08 LAB — URINE DRUG SCREEN, QUALITATIVE (ARMC ONLY)
Amphetamines, Ur Screen: NOT DETECTED
Barbiturates, Ur Screen: NOT DETECTED
Benzodiazepine, Ur Scrn: NOT DETECTED
Cannabinoid 50 Ng, Ur ~~LOC~~: POSITIVE — AB
Cocaine Metabolite,Ur ~~LOC~~: POSITIVE — AB
MDMA (Ecstasy)Ur Screen: NOT DETECTED
Methadone Scn, Ur: NOT DETECTED
Opiate, Ur Screen: NOT DETECTED
Phencyclidine (PCP) Ur S: NOT DETECTED
Tricyclic, Ur Screen: NOT DETECTED

## 2021-01-08 SURGERY — CYSTOSCOPY/URETEROSCOPY/HOLMIUM LASER/STENT PLACEMENT
Anesthesia: General | Laterality: Right

## 2021-01-08 MED ORDER — ORAL CARE MOUTH RINSE: 15.0000 mL | Freq: Once | OROMUCOSAL | Status: AC

## 2021-01-08 MED ORDER — GENTAMICIN SULFATE 40 MG/ML IJ SOLN
5.0000 mg/kg | INTRAVENOUS | Status: AC
  Administered 2021-01-08: 360 mg via INTRAVENOUS
  Filled 2021-01-08: qty 9

## 2021-01-08 MED ORDER — SODIUM CHLORIDE 0.9 % IV SOLN: INTRAVENOUS | Status: DC

## 2021-01-08 MED ORDER — CHLORHEXIDINE GLUCONATE 0.12 % MT SOLN
OROMUCOSAL | Status: AC
  Filled 2021-01-08: qty 15

## 2021-01-08 MED ORDER — CHLORHEXIDINE GLUCONATE 0.12 % MT SOLN
15.0000 mL | Freq: Once | OROMUCOSAL | Status: AC
  Administered 2021-01-08: 15 mL via OROMUCOSAL

## 2021-01-08 SURGICAL SUPPLY — 29 items
BAG DRAIN CYSTO-URO LG1000N (MISCELLANEOUS) ×2 IMPLANT
BASKET ZERO TIP 1.9FR (BASKET) IMPLANT
BRUSH SCRUB EZ 1% IODOPHOR (MISCELLANEOUS) ×2 IMPLANT
CATH URET FLEX-TIP 2 LUMEN 10F (CATHETERS) IMPLANT
CATH URETL OPEN 5X70 (CATHETERS) IMPLANT
CNTNR SPEC 2.5X3XGRAD LEK (MISCELLANEOUS)
CONT SPEC 4OZ STER OR WHT (MISCELLANEOUS)
CONTAINER SPEC 2.5X3XGRAD LEK (MISCELLANEOUS) IMPLANT
DRAPE UTILITY 15X26 TOWEL STRL (DRAPES) ×2 IMPLANT
GAUZE 4X4 16PLY ~~LOC~~+RFID DBL (SPONGE) ×4 IMPLANT
GLOVE SURG UNDER POLY LF SZ7.5 (GLOVE) ×2 IMPLANT
GOWN STRL REUS W/ TWL LRG LVL3 (GOWN DISPOSABLE) ×1 IMPLANT
GOWN STRL REUS W/ TWL XL LVL3 (GOWN DISPOSABLE) ×1 IMPLANT
GOWN STRL REUS W/TWL LRG LVL3 (GOWN DISPOSABLE) ×1
GOWN STRL REUS W/TWL XL LVL3 (GOWN DISPOSABLE) ×1
GUIDEWIRE STR DUAL SENSOR (WIRE) ×2 IMPLANT
INFUSOR MANOMETER BAG 3000ML (MISCELLANEOUS) ×2 IMPLANT
IV NS IRRIG 3000ML ARTHROMATIC (IV SOLUTION) ×2 IMPLANT
KIT TURNOVER CYSTO (KITS) ×2 IMPLANT
PACK CYSTO AR (MISCELLANEOUS) ×2 IMPLANT
SET CYSTO W/LG BORE CLAMP LF (SET/KITS/TRAYS/PACK) ×2 IMPLANT
SHEATH URETERAL 12FRX35CM (MISCELLANEOUS) IMPLANT
STENT URET 6FRX24 CONTOUR (STENTS) IMPLANT
STENT URET 6FRX26 CONTOUR (STENTS) IMPLANT
SURGILUBE 2OZ TUBE FLIPTOP (MISCELLANEOUS) ×2 IMPLANT
TRACTIP FLEXIVA PULSE ID 200 (Laser) ×2 IMPLANT
VALVE UROSEAL ADJ ENDO (VALVE) IMPLANT
WATER STERILE IRR 1000ML POUR (IV SOLUTION) ×2 IMPLANT
WATER STERILE IRR 500ML POUR (IV SOLUTION) ×2 IMPLANT

## 2021-01-08 NOTE — Progress Notes (Signed)
Patient UDS cocaine positive. Surgery cancelled. Office will be in contact with patient to reschedule.

## 2021-01-08 NOTE — Progress Notes (Signed)
Request preoperative medication from pharmacy to be tubed from pharmacy.  Spoke with Lequita Halt "will be tubed within 20 mins"

## 2021-01-09 LAB — CULTURE, URINE COMPREHENSIVE

## 2021-01-14 ENCOUNTER — Other Ambulatory Visit: Payer: Self-pay

## 2021-01-14 DIAGNOSIS — N201 Calculus of ureter: Secondary | ICD-10-CM

## 2021-01-14 DIAGNOSIS — N200 Calculus of kidney: Secondary | ICD-10-CM

## 2021-01-14 NOTE — Progress Notes (Signed)
[facility] Posting Form   Surgery Date/Time: Date: 01/22/2021  Surgeon: Dr. Irineo Axon  Surgery Location: Day Surgery  Inpt ( No  )   Outpt (Yes)   Obs ( No  )   Diagnosis: N20.1 Right Nephrolithiasis  -CPT: 52356  Surgery: URS/LL/Stent  Stop Anticoagulations: Yes  Cardiac/Medical/Pulmonary Clearance needed: Already obtained from Clarisa Kindred, NP  *Orders entered into EPIC  Date: 01/14/21   *Case booked in EPIC  Date: 01/14/21  *Notified pt of Surgery: Date: 01/14/21  PRE-OP UA & CX: UA and Culture  *Placed into Prior Authorization Work [place] Date: 01/14/21   Assistant/laser/rep:No

## 2021-01-14 NOTE — Progress Notes (Signed)
Surgical Physician Order Form  ** Scheduling expectation :  01/22/2021  *Length of Case:  2 Hours  *Clearance needed: no  *Anticoagulation Instructions: Hold all anticoagulants  *Aspirin Instructions: Hold Aspirin  *Post-op visit Date/Instructions:   1 week  Cysto Stent Removal with MD  *Diagnosis: Right Nephrolithiasis  *Procedure: Ureteroscopy w/laser lithotripsy & stent placement/exchange (54562)  -Admit type: OUTpatient  -Anesthesia: General  -VTE Prophylaxis Standing Order SCD's       Other:   -Standing Lab Orders Per Anesthesia    Lab other: Urine Culture  -Standing Test orders EKG/Chest x-ray per Anesthesia       Test other:   - Medications:     Gentamicin per pharmacy   Other Instructions:

## 2021-01-18 ENCOUNTER — Encounter: Payer: Self-pay | Admitting: Urology

## 2021-01-29 ENCOUNTER — Other Ambulatory Visit: Payer: Self-pay

## 2021-01-29 ENCOUNTER — Encounter: Payer: Self-pay | Admitting: Urology

## 2021-01-29 ENCOUNTER — Ambulatory Visit
Admission: RE | Admit: 2021-01-29 | Discharge: 2021-01-29 | Disposition: A | Discharge status: Home / Self Care | Payer: Self-pay | Attending: Urology | Admitting: Urology

## 2021-01-29 ENCOUNTER — Ambulatory Visit: Payer: Self-pay

## 2021-01-29 ENCOUNTER — Ambulatory Visit: Payer: Self-pay | Admitting: Anesthesiology

## 2021-01-29 ENCOUNTER — Encounter
Admission: RE | Disposition: A | Discharge status: Home / Self Care | Payer: Self-pay | Source: Home / Self Care | Attending: Urology

## 2021-01-29 DIAGNOSIS — Z79899 Other long term (current) drug therapy: Secondary | ICD-10-CM | POA: Insufficient documentation

## 2021-01-29 DIAGNOSIS — Z87891 Personal history of nicotine dependence: Secondary | ICD-10-CM | POA: Insufficient documentation

## 2021-01-29 DIAGNOSIS — N200 Calculus of kidney: Secondary | ICD-10-CM

## 2021-01-29 DIAGNOSIS — Z8616 Personal history of COVID-19: Secondary | ICD-10-CM | POA: Insufficient documentation

## 2021-01-29 DIAGNOSIS — Z880 Allergy status to penicillin: Secondary | ICD-10-CM | POA: Insufficient documentation

## 2021-01-29 HISTORY — PX: CYSTOSCOPY/URETEROSCOPY/HOLMIUM LASER/STENT PLACEMENT: SHX6546

## 2021-01-29 LAB — POCT I-STAT, CHEM 8
BUN: 23 mg/dL — ABNORMAL HIGH (ref 6–20)
Calcium, Ion: 1.29 mmol/L (ref 1.15–1.40)
Chloride: 109 mmol/L (ref 98–111)
Creatinine, Ser: 1.1 mg/dL (ref 0.61–1.24)
Glucose, Bld: 84 mg/dL (ref 70–99)
HCT: 39 % (ref 39.0–52.0)
Hemoglobin: 13.3 g/dL (ref 13.0–17.0)
Potassium: 4.1 mmol/L (ref 3.5–5.1)
Sodium: 142 mmol/L (ref 135–145)
TCO2: 25 mmol/L (ref 22–32)

## 2021-01-29 LAB — URINE DRUG SCREEN, QUALITATIVE (ARMC ONLY)
Amphetamines, Ur Screen: NOT DETECTED
Barbiturates, Ur Screen: NOT DETECTED
Benzodiazepine, Ur Scrn: NOT DETECTED
Cannabinoid 50 Ng, Ur ~~LOC~~: POSITIVE — AB
Cocaine Metabolite,Ur ~~LOC~~: NOT DETECTED
MDMA (Ecstasy)Ur Screen: NOT DETECTED
Methadone Scn, Ur: NOT DETECTED
Opiate, Ur Screen: NOT DETECTED
Phencyclidine (PCP) Ur S: NOT DETECTED
Tricyclic, Ur Screen: NOT DETECTED

## 2021-01-29 LAB — GLUCOSE, CAPILLARY
Glucose-Capillary: 97 mg/dL (ref 70–99)
Glucose-Capillary: 77 mg/dL (ref 70–99)

## 2021-01-29 SURGERY — CYSTOSCOPY/URETEROSCOPY/HOLMIUM LASER/STENT PLACEMENT
Anesthesia: General | Laterality: Right

## 2021-01-29 MED ORDER — ROCURONIUM BROMIDE 100 MG/10ML IV SOLN
INTRAVENOUS | Status: DC | PRN
  Administered 2021-01-29: 10 mg via INTRAVENOUS
  Administered 2021-01-29: 50 mg via INTRAVENOUS
  Administered 2021-01-29: 10 mg via INTRAVENOUS

## 2021-01-29 MED ORDER — FENTANYL CITRATE (PF) 100 MCG/2ML IJ SOLN
INTRAMUSCULAR | Status: AC
  Filled 2021-01-29: qty 2

## 2021-01-29 MED ORDER — ONDANSETRON HCL 4 MG/2ML IJ SOLN: 4.0000 mg | Freq: Once | INTRAMUSCULAR | Status: DC | PRN

## 2021-01-29 MED ORDER — FAMOTIDINE 20 MG PO TABS: 20.0000 mg | ORAL_TABLET | Freq: Once | ORAL | Status: AC

## 2021-01-29 MED ORDER — LACTATED RINGERS IV SOLN: INTRAVENOUS | Status: DC | PRN

## 2021-01-29 MED ORDER — FAMOTIDINE 20 MG PO TABS
ORAL_TABLET | ORAL | Status: AC
  Administered 2021-01-29: 20 mg via ORAL
  Filled 2021-01-29: qty 1

## 2021-01-29 MED ORDER — OXYCODONE HCL 5 MG PO TABS: 5.0000 mg | ORAL_TABLET | Freq: Once | ORAL | Status: DC | PRN

## 2021-01-29 MED ORDER — MIDAZOLAM HCL 2 MG/2ML IJ SOLN
INTRAMUSCULAR | Status: DC | PRN
  Administered 2021-01-29: 2 mg via INTRAVENOUS

## 2021-01-29 MED ORDER — ORAL CARE MOUTH RINSE: 15.0000 mL | Freq: Once | OROMUCOSAL | Status: AC

## 2021-01-29 MED ORDER — MIDAZOLAM HCL 2 MG/2ML IJ SOLN
INTRAMUSCULAR | Status: AC
  Filled 2021-01-29: qty 2

## 2021-01-29 MED ORDER — OXYCODONE HCL 5 MG/5ML PO SOLN
5.0000 mg | Freq: Once | ORAL | Status: DC | PRN
Start: 2021-01-29 — End: 2021-01-29

## 2021-01-29 MED ORDER — SODIUM CHLORIDE 0.9 % IR SOLN
Status: DC | PRN
  Administered 2021-01-29: 3000 mL

## 2021-01-29 MED ORDER — SODIUM CHLORIDE 0.9 % IV SOLN: INTRAVENOUS | Status: DC

## 2021-01-29 MED ORDER — STERILE WATER FOR IRRIGATION IR SOLN
Status: DC | PRN
  Administered 2021-01-29: 250 mL

## 2021-01-29 MED ORDER — CHLORHEXIDINE GLUCONATE 0.12 % MT SOLN
OROMUCOSAL | Status: AC
  Administered 2021-01-29: 15 mL via OROMUCOSAL
  Filled 2021-01-29: qty 15

## 2021-01-29 MED ORDER — ACETAMINOPHEN 10 MG/ML IV SOLN: 1000.0000 mg | Freq: Once | INTRAVENOUS | Status: DC | PRN

## 2021-01-29 MED ORDER — ACETAMINOPHEN 10 MG/ML IV SOLN
INTRAVENOUS | Status: AC
  Filled 2021-01-29: qty 100

## 2021-01-29 MED ORDER — FENTANYL CITRATE (PF) 100 MCG/2ML IJ SOLN
INTRAMUSCULAR | Status: DC | PRN
  Administered 2021-01-29 (×2): 50 ug via INTRAVENOUS
  Administered 2021-01-29 (×2): 25 ug via INTRAVENOUS

## 2021-01-29 MED ORDER — ACETAMINOPHEN 10 MG/ML IV SOLN
INTRAVENOUS | Status: DC | PRN
  Administered 2021-01-29: 1000 mg via INTRAVENOUS

## 2021-01-29 MED ORDER — HYDROCODONE-ACETAMINOPHEN 5-325 MG PO TABS: 1.0000 | ORAL_TABLET | Freq: Four times a day (QID) | ORAL | 0 refills | Status: DC | PRN

## 2021-01-29 MED ORDER — ONDANSETRON HCL 4 MG/2ML IJ SOLN
INTRAMUSCULAR | Status: AC
  Filled 2021-01-29: qty 2

## 2021-01-29 MED ORDER — IOHEXOL 180 MG/ML  SOLN
INTRAMUSCULAR | Status: DC | PRN
  Administered 2021-01-29 (×2): 10 mL
  Administered 2021-01-29: 7 mL

## 2021-01-29 MED ORDER — ONDANSETRON HCL 4 MG/2ML IJ SOLN
INTRAMUSCULAR | Status: DC | PRN
  Administered 2021-01-29: 4 mg via INTRAVENOUS

## 2021-01-29 MED ORDER — DEXAMETHASONE SODIUM PHOSPHATE 10 MG/ML IJ SOLN
INTRAMUSCULAR | Status: DC | PRN
  Administered 2021-01-29: 5 mg via INTRAVENOUS

## 2021-01-29 MED ORDER — LIDOCAINE HCL (CARDIAC) PF 100 MG/5ML IV SOSY
PREFILLED_SYRINGE | INTRAVENOUS | Status: DC | PRN
  Administered 2021-01-29: 80 mg via INTRAVENOUS

## 2021-01-29 MED ORDER — EPHEDRINE SULFATE 50 MG/ML IJ SOLN
INTRAMUSCULAR | Status: DC | PRN
  Administered 2021-01-29: 10 mg via INTRAVENOUS

## 2021-01-29 MED ORDER — GENTAMICIN SULFATE 40 MG/ML IJ SOLN
5.0000 mg/kg | INTRAVENOUS | Status: AC
  Administered 2021-01-29: 358.4 mg via INTRAVENOUS
  Filled 2021-01-29: qty 9

## 2021-01-29 MED ORDER — FENTANYL CITRATE (PF) 100 MCG/2ML IJ SOLN: 25.0000 ug | INTRAMUSCULAR | Status: DC | PRN

## 2021-01-29 MED ORDER — PROPOFOL 10 MG/ML IV BOLUS
INTRAVENOUS | Status: AC
  Filled 2021-01-29: qty 20

## 2021-01-29 MED ORDER — LACTATED RINGERS IV SOLN: INTRAVENOUS | Status: DC

## 2021-01-29 MED ORDER — OXYBUTYNIN CHLORIDE 5 MG PO TABS
ORAL_TABLET | ORAL | 0 refills | Status: DC
Start: 2021-01-29 — End: 2021-05-29

## 2021-01-29 MED ORDER — PROPOFOL 10 MG/ML IV BOLUS
INTRAVENOUS | Status: DC | PRN
  Administered 2021-01-29: 200 mg via INTRAVENOUS

## 2021-01-29 MED ORDER — CHLORHEXIDINE GLUCONATE 0.12 % MT SOLN: 15.0000 mL | Freq: Once | OROMUCOSAL | Status: AC

## 2021-01-29 MED ORDER — TAMSULOSIN HCL 0.4 MG PO CAPS: 0.4000 mg | ORAL_CAPSULE | Freq: Every day | ORAL | 0 refills | Status: DC

## 2021-01-29 MED ORDER — SUGAMMADEX SODIUM 200 MG/2ML IV SOLN
INTRAVENOUS | Status: DC | PRN
  Administered 2021-01-29: 200 mg via INTRAVENOUS

## 2021-01-29 MED ORDER — DEXAMETHASONE SODIUM PHOSPHATE 10 MG/ML IJ SOLN
INTRAMUSCULAR | Status: AC
  Filled 2021-01-29: qty 1

## 2021-01-29 SURGICAL SUPPLY — 32 items
BAG DRAIN CYSTO-URO LG1000N (MISCELLANEOUS) ×2 IMPLANT
BASKET ZERO TIP 1.9FR (BASKET) ×2 IMPLANT
BRUSH SCRUB EZ 1% IODOPHOR (MISCELLANEOUS) ×2 IMPLANT
BSKT STON RTRVL ZERO TP 1.9FR (BASKET) ×1
CATH URET FLEX-TIP 2 LUMEN 10F (CATHETERS) ×2 IMPLANT
CATH URETL OPEN 5X70 (CATHETERS) IMPLANT
CNTNR SPEC 2.5X3XGRAD LEK (MISCELLANEOUS) ×1
CONT SPEC 4OZ STER OR WHT (MISCELLANEOUS) ×1
CONT SPEC 4OZ STRL OR WHT (MISCELLANEOUS) ×1
CONTAINER SPEC 2.5X3XGRAD LEK (MISCELLANEOUS) ×1 IMPLANT
DRAPE UTILITY 15X26 TOWEL STRL (DRAPES) ×2 IMPLANT
GAUZE 4X4 16PLY ~~LOC~~+RFID DBL (SPONGE) ×4 IMPLANT
GLOVE SURG UNDER POLY LF SZ7.5 (GLOVE) ×2 IMPLANT
GOWN STRL REUS W/ TWL LRG LVL3 (GOWN DISPOSABLE) ×1 IMPLANT
GOWN STRL REUS W/ TWL XL LVL3 (GOWN DISPOSABLE) ×1 IMPLANT
GOWN STRL REUS W/TWL LRG LVL3 (GOWN DISPOSABLE) ×2
GOWN STRL REUS W/TWL XL LVL3 (GOWN DISPOSABLE) ×2
GUIDEWIRE STR DUAL SENSOR (WIRE) ×4 IMPLANT
INFUSOR MANOMETER BAG 3000ML (MISCELLANEOUS) ×2 IMPLANT
IV NS IRRIG 3000ML ARTHROMATIC (IV SOLUTION) ×2 IMPLANT
KIT TURNOVER CYSTO (KITS) ×2 IMPLANT
PACK CYSTO AR (MISCELLANEOUS) ×2 IMPLANT
SET CYSTO W/LG BORE CLAMP LF (SET/KITS/TRAYS/PACK) ×2 IMPLANT
SHEATH URETERAL 12FR 45CM (SHEATH) ×4 IMPLANT
SHEATH URETERAL 12FRX35CM (MISCELLANEOUS) IMPLANT
STENT URET 6FRX24 CONTOUR (STENTS) IMPLANT
STENT URET 6FRX26 CONTOUR (STENTS) ×2 IMPLANT
SURGILUBE 2OZ TUBE FLIPTOP (MISCELLANEOUS) ×2 IMPLANT
TRACTIP FLEXIVA PULSE ID 200 (Laser) ×2 IMPLANT
VALVE UROSEAL ADJ ENDO (VALVE) ×2 IMPLANT
WATER STERILE IRR 1000ML POUR (IV SOLUTION) ×2 IMPLANT
WATER STERILE IRR 500ML POUR (IV SOLUTION) ×2 IMPLANT

## 2021-01-29 NOTE — Anesthesia Postprocedure Evaluation (Signed)
Anesthesia Post Note  Patient: Darius [name]  Procedure(s) Performed: CYSTOSCOPY/URETEROSCOPY/HOLMIUM LASER/STENT PLACEMENT (Right)  Patient location during evaluation: PACU Anesthesia Type: General Level of consciousness: awake and alert, oriented and patient cooperative Pain management: pain level controlled Vital Signs Assessment: post-procedure vital signs reviewed and stable Respiratory status: spontaneous breathing, nonlabored ventilation and respiratory function stable Cardiovascular status: blood pressure returned to baseline and stable Postop Assessment: adequate PO intake Anesthetic complications: no   No notable events documented.   Last Vitals:  Vitals:   01/29/21 1450 01/29/21 1455  BP: (!) 146/86   Pulse: 74 72  Resp: (!) 32 12  Temp:    SpO2: 95% 98%    Last Pain:  Vitals:   01/29/21 1450  TempSrc:   PainSc: 0-No pain                 Reed Breech

## 2021-01-29 NOTE — Interval H&P Note (Signed)
History and Physical Interval Note:  01/29/2021 12:05 PM  Darius [name]  has presented today for surgery, with the diagnosis of Right Nephrolithiasis.  The various methods of treatment have been discussed with the patient and family. After consideration of risks, benefits and other options for treatment, the patient has consented to  Procedure(s): CYSTOSCOPY/URETEROSCOPY/HOLMIUM LASER/STENT PLACEMENT (Right) as a surgical intervention.  The patient's history has been reviewed, patient examined, no change in status, stable for surgery.  I have reviewed the patient's chart and labs.  Questions were answered to the patient's satisfaction.     [name] C Winthrop Shannahan

## 2021-01-29 NOTE — Op Note (Signed)
Preoperative diagnosis: Right nephrolithiasis   Postoperative diagnosis: Right nephrolithiasis  Procedure:  Cystoscopy Right ureteroscopy and stone removal Ureteroscopic laser lithotripsy Right ureteral stent placement (9F/26 cm)  Right retrograde pyelography with interpretation  Surgeon: Lorin Picket C. Shataria Crist, M.D.  Anesthesia: General  Complications: None  Intraoperative findings:  Cystoscopy-urethra normal in caliber without strictures; prostate mild lateral lobe enlargement; bladder mucosa without erythema, solid or papillary lesions.  UOs normal-appearing bilaterally Ureteropyeloscopy- 15 mm renal calculus had migrated to a right midpole calyx partially extending into the renal pelvis.  7 mm right lower pole calyx Right retrograde pyelography post procedure showed no filling defects, stone fragments or contrast extravasation   EBL: Minimal  Specimens: Calculus fragments for analysis   Indication: Darius [name] is a 50 y.o. with recurrent right flank pain and a 15 mm right renal pelvic calculus on CT as well as a 7 mm lower pole calculus. After reviewing the management options for treatment, the patient elected to proceed with the above surgical procedure(s). We have discussed the potential benefits and risks of the procedure, side effects of the proposed treatment, the likelihood of the patient achieving the goals of the procedure, and any potential problems that might occur during the procedure or recuperation. Informed consent has been obtained.  Description of procedure:  The patient was taken to the operating room and general anesthesia was induced.  The patient was placed in the dorsal lithotomy position, prepped and draped in the usual sterile fashion, and preoperative antibiotics were administered. A preoperative time-out was performed.   A 21 French cystoscope was lubricated, inserted per urethra and advanced proximally under direct vision with findings as described above.     Attention was directed to the right ureteral orifice and a 0.038 Sensor wire was then advanced up the ureter into the renal pelvis under fluoroscopic guidance.  The cystoscope was removed and a dual-lumen catheter was placed over the sensor wire and a second sensor wire was placed in a similar fashion.  A 12/14 French ureteral access sheath was placed over the working wire under fluoroscopic guidance without difficulty.  A single channel digital flexible ureteroscope was placed through the access sheath and advanced into the renal pelvis without difficulty.  The calculus had migrated to a midpole calyx as described above.  A 242 m holmium laser fiber was placed through the ureteroscope and the stone was dusted at a setting of 0.3 J and frequency of 80 hz.  During dusting several fragments estimated at 5 mm and size chipped from the main stone.  These were treated with noncontact laser lithotripsy at a setting of 0.6 J / 40 Hz.  Once the 15 mm calculus was treated the ureteroscope was advanced into the lower pole calyx and the 7 mm calculus was dusted at a setting of 0.3/80 with some fragments again treated with noncontact laser lithotripsy.  All calyces were examined and a few fragments greater than size 1 mm were removed with a 1.9 French 0 tip nitinol basket.  Right retrograde pyelogram was performed through the ureteroscope with findings as described above.  All calyces were examined and no fragments were noted >1 mm in size  The ureteral access sheath and ureteroscope were removed in tandem and the ureter showed no evidence of injury or perforation.  A 6 FR/ 26 CM Contour ureteral stent was placed under fluoroscopic guidance.  The wire was then removed.  The proximal stent tip was within an upper pole infundibulum and a good curl was  noted in the bladder.  The bladder was then emptied and the procedure ended.  The patient appeared to tolerate the procedure well and without complications.   After anesthetic reversal the patient was transported to the PACU in stable condition.    Plan: Follow-up office visit 1 week for KUB and cystoscopy with stent removal   Irineo Axon, MD

## 2021-01-29 NOTE — Anesthesia Procedure Notes (Signed)
Procedure Name: Intubation Date/Time: 01/29/2021 12:08 PM Performed by: Rodney Booze, CRNA Pre-anesthesia Checklist: Patient identified, Emergency Drugs available, Suction available and Patient being monitored Patient Re-evaluated:Patient Re-evaluated prior to induction Oxygen Delivery Method: Circle system utilized Preoxygenation: Pre-oxygenation with 100% oxygen Induction Type: IV induction Ventilation: Mask ventilation without difficulty Laryngoscope Size: McGraph and 3 Grade View: Grade I Tube type: Oral Tube size: 7.0 mm Number of attempts: 2 (first attempt with miller 2 and only able to obtain a grade 3 view) Airway Equipment and Method: Stylet and Oral airway Placement Confirmation: ETT inserted through vocal cords under direct vision, positive ETCO2 and breath sounds checked- equal and bilateral Secured at: 21 cm Tube secured with: Tape Dental Injury: Teeth and Oropharynx as per pre-operative assessment

## 2021-01-29 NOTE — Discharge Instructions (Addendum)
DISCHARGE INSTRUCTIONS FOR KIDNEY STONE/URETERAL STENT   MEDICATIONS:  1. Resume all your other meds from home.  2.  AZO (over-the-counter) can help with the burning/stinging when you urinate. 3.  Hydrocodone is for moderate/severe pain, Rx was sent to your pharmacy. 4.  Tamsulosin and oxybutynin for bladder irritation.  Rxs were sent to your pharmacy [facility] [address])  ACTIVITY:  1. May resume regular activities in 24 hours. 2. No driving while on narcotic pain medications  3. Drink plenty of water  4. Continue to walk at home - you can still get blood clots when you are at home, so keep active, but don't over do it.  5. May return to work/school tomorrow or when you feel ready      SIGNS/SYMPTOMS TO CALL:  Common postoperative symptoms include urinary frequency, urgency, bladder spasm and blood in the urine  Please call us if you have a fever greater than 101.5, uncontrolled nausea/vomiting, uncontrolled pain, dizziness, unable to urinate, excessively bloody urine, chest pain, shortness of breath, leg swelling, leg pain, or any other concerns or questions.   You can reach [place] at [phone].   FOLLOW-UP:  1. You we will be contacted for an appointment for stent removal next week  AMBULATORY SURGERY  DISCHARGE INSTRUCTIONS   The drugs that you were given will stay in your system until tomorrow so for the next 24 hours you should not:  Drive an automobile Make any legal decisions Drink any alcoholic beverage   You may resume regular meals tomorrow.  Today it is better to start with liquids and gradually work up to solid foods.  You may eat anything you prefer, but it is better to start with liquids, then soup and crackers, and gradually work up to solid foods.   Please notify your doctor immediately if you have any unusual bleeding, trouble breathing, redness and pain at the surgery site, drainage, fever, or pain not relieved by medication.    Additional  Instructions:     Please contact your physician with any problems or Same Day Surgery at [phone], Monday through Friday 6 am to 4 pm, or [place] at [place] number at [phone].

## 2021-01-29 NOTE — H&P (Signed)
Urology H&P   Chief Complaint: Kidney stone  History of Present Illness: Darius [name] is a 50 y.o. male with a 15 mm right renal pelvic calculus and 7 mm right lower pole calculus.  He was scheduled for ureteroscopic stone removal last month however UDS + cocaine.  He is rescheduled and UDS today negative  Past Medical History:  Diagnosis Date   CHF (congestive heart failure) (HCC)    a.) TTE on 12/19/2017 --> LVEF 60-65%, no RWMAs, mild LA dilitation, PASP 44 mmHg   COPD (chronic obstructive pulmonary disease) (HCC)    DOE (dyspnea on exertion)    Glaucoma    History of 2019 novel coronavirus disease (COVID-19) 09/15/2020   Hypertension    Moderate concentric left ventricular hypertrophy 12/19/2017   Nephrolithiasis    Polysubstance abuse (HCC)    ETOH, marijuana, cocaine   Seizures (HCC)    T2DM (type 2 diabetes mellitus) (HCC)    a.)  Controlled with diet lifestyle modifications   Valvular heart disease    a.) TTE on 12/19/2017 --> mild MV regurgitation; moderate AV regurgitation    Past Surgical History:  Procedure Laterality Date   NO PAST SURGERIES      Home Medications:  Current Meds  Medication Sig   furosemide (LASIX) 20 MG tablet Take 1 tablet (20 mg total) by mouth daily.    Allergies:  Allergies  Allergen Reactions   Penicillins Other (See Comments)    Patient states that mother is highly allergic. 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  Has patient had a PCN reaction causing immediate rash, facial/tongue/throat swelling, SOB or lightheadedness with hypotension: No Has patient had a PCN reaction causing severe rash involving mucus membranes or skin necrosis: No Has patient had a PCN reaction that required hospitalization Yes Has patient had a PCN reaction occurring within the last 10 years: No If all of the above answers are "N    Family History  Problem Relation Age of Onset   Hypertension Mother    Kidney disease Mother     Diabetes Mother    Mitral valve prolapse Mother    Diabetes Father    Hypertension Maternal Grandmother    Mitral valve prolapse Maternal Grandmother    Other Half-Brother        accident    Social History:  reports that he quit smoking about 5 months ago. His smoking use included cigarettes. He has a 35.00 pack-year smoking history. He has never used smokeless tobacco. He reports current alcohol use of about 6.0 standard drinks per week. He reports current drug use. Drugs: Marijuana, Cocaine, Other-see comments, and "Crack" cocaine.  ROS: Otherwise noncontributory  Physical Exam:  Vital signs in last 24 hours: Temp:  [96.9 F (36.1 C)] 96.9 F (36.1 C) (10/04 1102) Pulse Rate:  [71] 71 (10/04 1102) Resp:  [17] 17 (10/04 1102) BP: (153)/(111) 153/111 (10/04 1102) SpO2:  [100 %] 100 % (10/04 1102) Weight:  [72.6 kg] 72.6 kg (10/04 1102) Constitutional:  Alert and oriented, No acute distress HEENT: [place] AT, moist mucus membranes.  Trachea midline, no masses Cardiovascular: Regular rate and rhythm, no clubbing, cyanosis, or edema. Respiratory: Normal respiratory effort, lungs clear bilaterally GI: Abdomen is soft, nontender, nondistended, no abdominal masses GU: No CVA tenderness Skin: No rashes, bruises or suspicious lesions Lymph: No cervical or inguinal adenopathy Neurologic: Grossly intact, no focal deficits, moving all 4 extremities Psychiatric: Normal mood and affect   Laboratory Data:  Recent Labs    01/29/21 1116  HGB 13.3  HCT 39.0   Recent Labs    01/29/21 1116  NA 142  K 4.1  CL 109  GLUCOSE 84  BUN 23*  CREATININE 1.10   No results for input(s): LABPT, INR in the last 72 hours. No results for input(s): LABURIN in the last 72 hours. Results for orders placed or performed in visit on 01/04/21  CULTURE, URINE COMPREHENSIVE     Status: None   Collection Time: 01/04/21  3:05 PM   Specimen: Urine   UR  Result Value Ref Range Status   Urine Culture,  Comprehensive Final report  Final   Organism ID, Bacteria Comment  Final    Comment: Mixed urogenital flora 10,000-25,000 colony forming units per mL   Microscopic Examination     Status: Abnormal   Collection Time: 01/04/21  3:05 PM   Urine  Result Value Ref Range Status   WBC, UA 11-30 (A) 0 - 5 /hpf Final   RBC >30 (A) 0 - 2 /hpf Final   Epithelial Cells (non renal) 0-10 0 - 10 /hpf Final   Casts Present (A) None seen /lpf Final   Cast Type Hyaline casts N/A Final   Mucus, UA Present (A) Not Estab. Final   Bacteria, UA Few None seen/Few Final     Radiologic Imaging: DG OR UROLOGY CYSTO IMAGE ([facility] ONLY)  Result Date: 01/29/2021 There is no interpretation for this exam.  This order is for images obtained during a surgical procedure.  Please See "Surgeries" Tab for more information regarding the procedure.    Impression/Assessment:  15 mm right renal pelvic calculus and 7 mm right lower pole calculus for ureteroscopy  Plan:  The procedure was cussed in detail including potential risks of bleeding, infection/sepsis and ureteral injury.  We discussed the need for a postoperative ureteral stent with the possibility of stent pain.  We also discussed in a small percentage of patients the upper tract cannot be accessed by the ureteroscope and if this were to occur the stone could not be treated but a stent would be placed and he would need a follow-up procedure in approximately 2 weeks.  All questions were answered and he desires to proceed  01/29/2021, 12:01 PM  Irineo Axon,  MD

## 2021-01-29 NOTE — Anesthesia Preprocedure Evaluation (Addendum)
Anesthesia Evaluation  Patient identified by MRN, date of birth, ID band Patient awake    Reviewed: Allergy & Precautions, NPO status , Patient's Chart, lab work & pertinent test results  History of Anesthesia Complications Negative for: history of anesthetic complications  Airway Mallampati: III   Neck ROM: Full    Dental  (+)    Pulmonary COPD, Patient abstained from smoking., former smoker (quit 08/2020; current vaping),    Pulmonary exam normal breath sounds clear to auscultation       Cardiovascular hypertension, +CHF  Normal cardiovascular exam+ Valvular Problems/Murmurs (MR, AR)  Rhythm:Regular Rate:Normal  ECG 09/15/20: NSR, biatrial enlargement, LVH, T wave abnormality   Neuro/Psych Seizures - (as a child),  Polysubstance use disorder: alcohol (last on 01/28/21), marijuana (last 3 days ago), cocaine (last 7 days ago)    GI/Hepatic negative GI ROS,   Endo/Other  diabetes, Type 2  Renal/GU Renal disease (nephrolithiasis)     Musculoskeletal   Abdominal   Peds  Hematology negative hematology ROS (+)   Anesthesia Other Findings   Reproductive/Obstetrics                            Anesthesia Physical Anesthesia Plan  ASA: 3  Anesthesia Plan: General   Post-op Pain Management:    Induction: Intravenous  PONV Risk Score and Plan: 2 and Ondansetron, Dexamethasone and Treatment may vary due to age or medical condition  Airway Management Planned: Oral ETT  Additional Equipment:   Intra-op Plan:   Post-operative Plan: Extubation in OR  Informed Consent: I have reviewed the patients History and Physical, chart, labs and discussed the procedure including the risks, benefits and alternatives for the proposed anesthesia with the patient or authorized representative who has indicated his/her understanding and acceptance.       Plan Discussed with: CRNA  Anesthesia Plan Comments:         Anesthesia Quick Evaluation

## 2021-01-29 NOTE — Transfer of Care (Signed)
Immediate Anesthesia Transfer of Care Note  Patient: Darius [name]  Procedure(s) Performed: CYSTOSCOPY/URETEROSCOPY/HOLMIUM LASER/STENT PLACEMENT (Right)  Patient Location: PACU  Anesthesia Type:General  Level of Consciousness: awake, alert  and oriented  Airway & Oxygen Therapy: Patient Spontanous Breathing and Patient connected to face mask oxygen  Post-op Assessment: Report given to RN and Post -op Vital signs reviewed and stable  Post vital signs: stable  Last Vitals:  Vitals Value Taken Time  BP 127/69 01/29/21 1421  Temp    Pulse 66 01/29/21 1424  Resp 21 01/29/21 1424  SpO2 99 % 01/29/21 1424  Vitals shown include unvalidated device data.  Last Pain:  Vitals:   01/29/21 1102  TempSrc: Temporal  PainSc: 4          Complications: No notable events documented.

## 2021-01-30 ENCOUNTER — Encounter: Payer: Self-pay | Admitting: Urology

## 2021-01-30 ENCOUNTER — Other Ambulatory Visit: Payer: Self-pay | Admitting: *Deleted

## 2021-01-30 ENCOUNTER — Telehealth: Payer: Self-pay | Admitting: *Deleted

## 2021-01-30 DIAGNOSIS — N200 Calculus of kidney: Secondary | ICD-10-CM

## 2021-01-30 NOTE — Telephone Encounter (Signed)
He may go back to work tomorrow if he feel okay. Patient states he is feeling much better today . His urine is clearing.

## 2021-01-30 NOTE — Telephone Encounter (Signed)
-----   Message from Kipp Brood sent at 01/30/2021  9:35 AM EDT ----- Regarding: RE: Stent removal/KUB App made patient wants to know how long he needs to be out of work? ----- Message ----- From: Riki Altes, MD Sent: 01/30/2021   7:17 AM EDT To: Blane Ohara, CMA Subject: Stent removal/KUB                              Please schedule cystoscopy with stent removal next week with KUB prior.  Thanks

## 2021-02-03 LAB — CALCULI, WITH PHOTOGRAPH (CLINICAL LAB)
Calcium Oxalate Dihydrate: 30 %
Calcium Oxalate Monohydrate: 60 %
Hydroxyapatite: 10 %
Weight Calculi: 70 mg

## 2021-02-05 ENCOUNTER — Telehealth: Payer: Self-pay | Admitting: Gerontology

## 2021-02-05 NOTE — Telephone Encounter (Signed)
-----   Message from Rolm Gala, NP sent at 02/05/2021 10:36 AM EDT ----- Pls schedule an in clinic appointment and make a telephone note. Thank you

## 2021-02-07 ENCOUNTER — Other Ambulatory Visit: Payer: Self-pay

## 2021-02-07 ENCOUNTER — Ambulatory Visit
Admission: RE | Admit: 2021-02-07 | Discharge: 2021-02-07 | Disposition: A | Discharge status: Home / Self Care | Payer: Self-pay | Source: Ambulatory Visit | Attending: Urology | Admitting: Urology

## 2021-02-07 ENCOUNTER — Ambulatory Visit (INDEPENDENT_AMBULATORY_CARE_PROVIDER_SITE_OTHER): Payer: Self-pay | Admitting: Urology

## 2021-02-07 ENCOUNTER — Encounter: Payer: Self-pay | Admitting: Urology

## 2021-02-07 VITALS — BP 114/69 | HR 68 | Ht 72.0 in | Wt 160.0 lb

## 2021-02-07 DIAGNOSIS — N200 Calculus of kidney: Secondary | ICD-10-CM

## 2021-02-07 LAB — URINALYSIS, COMPLETE
Bilirubin, UA: NEGATIVE
Glucose, UA: NEGATIVE
Ketones, UA: NEGATIVE
Nitrite, UA: NEGATIVE
Specific Gravity, UA: 1.01 (ref 1.005–1.030)
Urobilinogen, Ur: 0.2 mg/dL (ref 0.2–1.0)
pH, UA: 7 (ref 5.0–7.5)

## 2021-02-07 LAB — MICROSCOPIC EXAMINATION: RBC, Urine: >30 /hpf — AB (ref 0–2)

## 2021-02-07 MED ORDER — LEVOFLOXACIN 500 MG PO TABS
500.0000 mg | ORAL_TABLET | Freq: Once | ORAL | Status: AC
  Administered 2021-02-07: 500 mg via ORAL

## 2021-02-07 NOTE — Progress Notes (Signed)
Indications: Patient is 50 y.o., who is s/p ureteroscopic removal of 7 and 15 mm right renal calculi 01/29/2021.  He had no postoperative problems.  KUB today reviewed and there are fine, powdery fragments in a lower pole calyx.  The patient is presenting today for stent removal.  Procedure:  Flexible Cystoscopy with stent removal (72620)  Timeout was performed and the correct patient, procedure and participants were identified.    Description:  The patient was prepped and draped in the usual sterile fashion. Flexible cystosopy was performed.  The stent was visualized, grasped, and removed intact without difficulty. The patient tolerated the procedure well.  A single dose of oral antibiotics was given.  Complications:  None  Plan:  Instructed to call for fever/flank pain post stent removal Follow-up for-6 weeks with KUB   Irineo Axon, MD

## 2021-02-07 NOTE — Addendum Note (Signed)
Addended by: Honor Loh on: 02/07/2021 10:11 AM   Modules accepted: Orders

## 2021-02-14 ENCOUNTER — Ambulatory Visit: Payer: Self-pay | Admitting: Gerontology

## 2021-02-20 ENCOUNTER — Ambulatory Visit: Payer: Self-pay | Admitting: Gerontology

## 2021-03-12 ENCOUNTER — Ambulatory Visit: Payer: Self-pay | Admitting: Family

## 2021-03-25 NOTE — Progress Notes (Deleted)
Lft   Patient ID: Darius [name], male    DOB: 13-Jul-1970, 50 y.o.   MRN: [id number]  HPI  Darius [name] is a 50 y/o male with a history of HTN, HF, current tobacco, alcohol and drug use.   Echo report from 12/19/17 reviewed and showed an EF of 60-65% along with mild Darius and mildly elevated PA pressure of 44 mm Hg.   Was in the ED 12/25/20 due to kidney stones. Evaluated and released.   He presents today for a follow-up visit with a chief complaint of   Past Medical History:  Diagnosis Date   CHF (congestive heart failure) (HCC)    a.) TTE on 12/19/2017 --> LVEF 60-65%, no RWMAs, mild LA dilitation, PASP 44 mmHg   COPD (chronic obstructive pulmonary disease) (HCC)    DOE (dyspnea on exertion)    Glaucoma    History of 2019 novel coronavirus disease (COVID-19) 09/15/2020   Hypertension    Moderate concentric left ventricular hypertrophy 12/19/2017   Nephrolithiasis    Polysubstance abuse (HCC)    ETOH, marijuana, cocaine   Seizures (HCC)    T2DM (type 2 diabetes mellitus) (HCC)    a.)  Controlled with diet lifestyle modifications   Valvular heart disease    a.) TTE on 12/19/2017 --> mild MV regurgitation; moderate AV regurgitation   Past Surgical History:  Procedure Laterality Date   CYSTOSCOPY/URETEROSCOPY/HOLMIUM LASER/STENT PLACEMENT Right 01/29/2021   Procedure: CYSTOSCOPY/URETEROSCOPY/HOLMIUM LASER/STENT PLACEMENT;  Surgeon: Riki Altes, MD;  Location: [facility] ORS;  Service: Urology;  Laterality: Right;   NO PAST SURGERIES     Family History  Problem Relation Age of Onset   Hypertension Mother    Kidney disease Mother    Diabetes Mother    Mitral valve prolapse Mother    Diabetes Father    Hypertension Maternal Grandmother    Mitral valve prolapse Maternal Grandmother    Other Half-Brother        accident   Social History   Tobacco Use   Smoking status: Former    Packs/day: 1.00    Years: 35.00    Pack years: 35.00    Types: Cigarettes    Quit date: 08/2020     Years since quitting: 0.5   Smokeless tobacco: Never  Substance Use Topics   Alcohol use: Yes    Alcohol/week: 6.0 standard drinks    Types: 6 Cans of beer per week   Allergies  Allergen Reactions   Penicillins Other (See Comments)    Patient states that mother is highly allergic. 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  Has patient had a PCN reaction causing immediate rash, facial/tongue/throat swelling, SOB or lightheadedness with hypotension: No Has patient had a PCN reaction causing severe rash involving mucus membranes or skin necrosis: No Has patient had a PCN reaction that required hospitalization Yes Has patient had a PCN reaction occurring within the last 10 years: No If all of the above answers are "N     Review of Systems  Constitutional:  Positive for fatigue. Negative for appetite change.  HENT:  Negative for congestion, postnasal drip and sore throat.   Eyes: Negative.   Respiratory:  Positive for cough and shortness of breath (easily). Negative for apnea.   Cardiovascular:  Positive for leg swelling (couple of days ago). Negative for chest pain and palpitations.  Gastrointestinal:  Positive for abdominal pain (right lower side) and diarrhea (loose stools). Negative for abdominal distention.  Endocrine: Negative.  Genitourinary:  Negative for difficulty urinating and dysuria.  Musculoskeletal:  Positive for back pain (right lower back). Negative for neck pain.  Skin: Negative.   Allergic/Immunologic: Negative.   Neurological:  Positive for light-headedness (at times). Negative for dizziness.  Hematological:  Negative for adenopathy. Does not bruise/bleed easily.  Psychiatric/Behavioral:  Positive for sleep disturbance (due to abdominal pain/ shortness of breath). Negative for dysphoric mood. The patient is nervous/anxious.       Physical Exam Vitals and nursing note reviewed. Exam conducted with a chaperone present (wife).  Constitutional:       Appearance: Normal appearance.  HENT:     Head: Normocephalic and atraumatic.  Cardiovascular:     Rate and Rhythm: Normal rate and regular rhythm.  Pulmonary:     Effort: Pulmonary effort is normal. No respiratory distress.     Breath sounds: Rales (few right lower lobe) present. No wheezing.  Abdominal:     General: Abdomen is flat. There is no distension.     Palpations: Abdomen is soft.     Tenderness: There is abdominal tenderness (right upper quadrant).  Musculoskeletal:        General: No tenderness.     Cervical back: Normal range of motion and neck supple.     Right lower leg: No edema.     Left lower leg: No edema.  Skin:    General: Skin is warm and dry.  Neurological:     General: No focal deficit present.     Mental Status: He is alert and oriented to person, place, and time.  Psychiatric:        Mood and Affect: Mood normal.        Behavior: Behavior normal.        Thought Content: Thought content normal.   Assessment & Plan:  1: Chronic heart failure with preserved ejection fraction without structural changes- - NYHA class III - euvolemic today - weighing daily & he was reminded to call for an overnight weight gain of >2 pounds or a weekly weight gain of >5 pounds - weight 157 pounds from last visit here 3 months ago  - not adding salt to his food & reminded to closely follow a low sodium diet - BNP 12/18/17 was 842.0  2: HTN- - BP  - saw PCP at [facility] 11/21/20 - BMP on 01/29/21 reviewed and showed sodium 142, potassium 4.1, creatinine 1.10   3: Tobacco use- - smokes 1/3 ppd of cigarettes  - complete cessation discussed for 3 minutes with him  4: Nephrolithiasis-  - saw urology 12/26/20 - Cystoscopy, Right ureteroscopy and stone removal, Ureteroscopic laser lithotripsy & Right ureteral stent placement (30F/26 cm) on 01/29/21   Patient did not bring his medications nor a list. Each medication was verbally reviewed with the patient and he was  encouraged to bring the bottles to every visit to confirm accuracy of list.

## 2021-03-26 ENCOUNTER — Telehealth: Payer: Self-pay | Admitting: Family

## 2021-03-26 ENCOUNTER — Ambulatory Visit: Payer: Self-pay | Admitting: Family

## 2021-03-26 NOTE — Telephone Encounter (Signed)
Patient did not show for his [facility] appointment on 03/26/21 even after patient confirmed. Will attempt to reschedule.

## 2021-04-03 ENCOUNTER — Emergency Department
Admission: EM | Admit: 2021-04-03 | Discharge: 2021-04-03 | Discharge status: Against Medical Advice | Payer: Self-pay | Source: Home / Self Care

## 2021-04-17 ENCOUNTER — Telehealth: Payer: Self-pay | Admitting: Family

## 2021-04-17 NOTE — Telephone Encounter (Signed)
Patient called to say that he thinks he "got a tapeworm from work". He says that he thinks they didn't cook the pork enough. He's having some abdominal pain (also had previous kidney stone) and some nausea but he's concerned because he read that tapeworms can go towards your heart to try and keep warm. He mentions wanting an xray or some test to find this out.   Explained that if he felt like something needed to be done urgently, he would need to go to an Urgent Care or the ED but that I was going to be out of the office for the next week. Explained that he could call [facility] or urology and see if they could see him, otherwise, again he could go to an Urgent Care or ED.   He says that he went to the ED recently but left because he didn't want to sit there for 6 hours. Encouraged him to go back there if he needed emergent care and sit as long as it needed to be looked at. He then says that he's been feeling this way "for awhile" and feels like he can wait until his next scheduled appointment with me early January. Again, emphasized to continue taking his medications and go to the ED if needed. Patient verbalized understanding.

## 2021-04-22 ENCOUNTER — Emergency Department: Payer: Self-pay

## 2021-04-22 ENCOUNTER — Emergency Department
Admission: EM | Admit: 2021-04-22 | Discharge: 2021-04-22 | Disposition: A | Discharge status: Home / Self Care | Payer: Self-pay | Attending: Emergency Medicine | Admitting: Emergency Medicine

## 2021-04-22 ENCOUNTER — Other Ambulatory Visit: Payer: Self-pay

## 2021-04-22 DIAGNOSIS — R197 Diarrhea, unspecified: Secondary | ICD-10-CM | POA: Insufficient documentation

## 2021-04-22 DIAGNOSIS — Z8616 Personal history of COVID-19: Secondary | ICD-10-CM | POA: Insufficient documentation

## 2021-04-22 DIAGNOSIS — I509 Heart failure, unspecified: Secondary | ICD-10-CM | POA: Insufficient documentation

## 2021-04-22 DIAGNOSIS — I110 Hypertensive heart disease with heart failure: Secondary | ICD-10-CM | POA: Insufficient documentation

## 2021-04-22 DIAGNOSIS — Z79899 Other long term (current) drug therapy: Secondary | ICD-10-CM | POA: Insufficient documentation

## 2021-04-22 DIAGNOSIS — J449 Chronic obstructive pulmonary disease, unspecified: Secondary | ICD-10-CM | POA: Insufficient documentation

## 2021-04-22 DIAGNOSIS — Z7982 Long term (current) use of aspirin: Secondary | ICD-10-CM | POA: Insufficient documentation

## 2021-04-22 DIAGNOSIS — E119 Type 2 diabetes mellitus without complications: Secondary | ICD-10-CM | POA: Insufficient documentation

## 2021-04-22 DIAGNOSIS — Z87891 Personal history of nicotine dependence: Secondary | ICD-10-CM | POA: Insufficient documentation

## 2021-04-22 LAB — GASTROINTESTINAL PANEL BY PCR, STOOL (REPLACES STOOL CULTURE)

## 2021-04-22 LAB — CBC WITH DIFFERENTIAL/PLATELET
Abs Immature Granulocytes: 0.01 10*3/uL (ref 0.00–0.07)
Basophils Absolute: 0 10*3/uL (ref 0.0–0.1)
Basophils Relative: 1 %
Eosinophils Absolute: 0.1 10*3/uL (ref 0.0–0.5)
Eosinophils Relative: 2 %
HCT: 40.4 % (ref 39.0–52.0)
Hemoglobin: 13.8 g/dL (ref 13.0–17.0)
Immature Granulocytes: 0 %
Lymphocytes Relative: 28 %
Lymphs Abs: 1 10*3/uL (ref 0.7–4.0)
MCH: 32.2 pg (ref 26.0–34.0)
MCHC: 34.2 g/dL (ref 30.0–36.0)
MCV: 94.2 fL (ref 80.0–100.0)
Monocytes Absolute: 0.3 10*3/uL (ref 0.1–1.0)
Monocytes Relative: 9 %
Neutro Abs: 2.1 10*3/uL (ref 1.7–7.7)
Neutrophils Relative %: 60 %
Platelets: 212 10*3/uL (ref 150–400)
RBC: 4.29 MIL/uL (ref 4.22–5.81)
RDW: 12.2 % (ref 11.5–15.5)
WBC: 3.5 10*3/uL — ABNORMAL LOW (ref 4.0–10.5)
nRBC: 0 % (ref 0.0–0.2)

## 2021-04-22 LAB — LIPASE, BLOOD: Lipase: 30 U/L (ref 11–51)

## 2021-04-22 LAB — COMPREHENSIVE METABOLIC PANEL
ALT: 20 U/L (ref 0–44)
AST: 30 U/L (ref 15–41)
Albumin: 4 g/dL (ref 3.5–5.0)
Alkaline Phosphatase: 66 U/L (ref 38–126)
Anion gap: 6 (ref 5–15)
BUN: 15 mg/dL (ref 6–20)
CO2: 27 mmol/L (ref 22–32)
Calcium: 9.2 mg/dL (ref 8.9–10.3)
Chloride: 108 mmol/L (ref 98–111)
Creatinine, Ser: 1.24 mg/dL (ref 0.61–1.24)
GFR, Estimated: >60 mL/min (ref >60)
Glucose, Bld: 99 mg/dL (ref 70–99)
Potassium: 4.1 mmol/L (ref 3.5–5.1)
Sodium: 141 mmol/L (ref 135–145)
Total Bilirubin: 0.9 mg/dL (ref 0.3–1.2)
Total Protein: 6.9 g/dL (ref 6.5–8.1)

## 2021-04-22 LAB — URINALYSIS, ROUTINE W REFLEX MICROSCOPIC
Bilirubin Urine: NEGATIVE
Glucose, UA: NEGATIVE mg/dL
Hgb urine dipstick: NEGATIVE
Ketones, ur: NEGATIVE mg/dL
Leukocytes,Ua: NEGATIVE
Nitrite: NEGATIVE
Protein, ur: NEGATIVE mg/dL
Specific Gravity, Urine: 1.025 (ref 1.005–1.030)
pH: 7 (ref 5.0–8.0)

## 2021-04-22 LAB — CBC
HCT: 40.5 % (ref 39.0–52.0)
Hemoglobin: 13.7 g/dL (ref 13.0–17.0)
MCH: 31.8 pg (ref 26.0–34.0)
MCHC: 33.8 g/dL (ref 30.0–36.0)
MCV: 94 fL (ref 80.0–100.0)
Platelets: 189 10*3/uL (ref 150–400)
RBC: 4.31 MIL/uL (ref 4.22–5.81)
RDW: 12.1 % (ref 11.5–15.5)
WBC: 3.5 10*3/uL — ABNORMAL LOW (ref 4.0–10.5)
nRBC: 0 % (ref 0.0–0.2)

## 2021-04-22 LAB — C DIFFICILE QUICK SCREEN W PCR REFLEX
C Diff antigen: NEGATIVE
C Diff interpretation: NOT DETECTED
C Diff toxin: NEGATIVE

## 2021-04-22 MED ORDER — IOHEXOL 300 MG/ML  SOLN
100.0000 mL | Freq: Once | INTRAMUSCULAR | Status: AC | PRN
  Administered 2021-04-22: 15:00:00 75 mL via INTRAVENOUS

## 2021-04-22 NOTE — ED Provider Notes (Signed)
[facility] Emergency Department Provider Note   ____________________________________________   Event Date/Time   First MD Initiated Contact with Patient 04/22/21 1315     (approximate)  I have reviewed the triage vital signs and the nursing notes.   HISTORY  Chief Complaint Abdominal Pain    HPI Darius [name] is a 50 y.o. male patient comes in because he thinks he may have tapeworm.  He has had loose stools for about 6 months.  His wife and his coworkers both thought they saw something moving around in the skin of his chest and look like it was got poked through.  Patient reports he may have eaten some undercooked pork.  He has no other complaints except for calluses on his feet.  I will refer him to podiatry for that as his brother who is diabetic lost his foot from a condition started in a similar way and patient himself is a diabetic treated with diet.        Past Medical History:  Diagnosis Date   CHF (congestive heart failure) (HCC)    a.) TTE on 12/19/2017 --> LVEF 60-65%, no RWMAs, mild LA dilitation, PASP 44 mmHg   COPD (chronic obstructive pulmonary disease) (HCC)    DOE (dyspnea on exertion)    Glaucoma    History of 2019 novel coronavirus disease (COVID-19) 09/15/2020   Hypertension    Moderate concentric left ventricular hypertrophy 12/19/2017   Nephrolithiasis    Polysubstance abuse (HCC)    ETOH, marijuana, cocaine   Seizures (HCC)    T2DM (type 2 diabetes mellitus) (HCC)    a.)  Controlled with diet lifestyle modifications   Valvular heart disease    a.) TTE on 12/19/2017 --> mild MV regurgitation; moderate AV regurgitation    Patient Active Problem List   Diagnosis Date Noted   Shortness of breath 08/15/2020   Encounter to establish care 07/11/2020   Abdominal pain 07/11/2020   HTN (hypertension) 12/30/2017   Tobacco use 12/30/2017   Substance use disorder 12/30/2017   CHF (congestive heart failure) (HCC) 12/18/2017     Past Surgical History:  Procedure Laterality Date   CYSTOSCOPY/URETEROSCOPY/HOLMIUM LASER/STENT PLACEMENT Right 01/29/2021   Procedure: CYSTOSCOPY/URETEROSCOPY/HOLMIUM LASER/STENT PLACEMENT;  Surgeon: Riki Altes, MD;  Location: [facility] ORS;  Service: Urology;  Laterality: Right;   NO PAST SURGERIES      Prior to Admission medications   Medication Sig Start Date End Date Taking? Authorizing Provider  albuterol (VENTOLIN HFA) 108 (90 Base) MCG/ACT inhaler Inhale 2 puffs into the lungs every 6 (six) hours as needed for wheezing or shortness of breath. 08/15/20   Iloabachie, Chioma E, NP  aspirin EC 81 MG tablet Take 81 mg by mouth daily. Swallow whole.    [provider]  furosemide (LASIX) 20 MG tablet Take 1 tablet (20 mg total) by mouth daily. 12/24/20 03/24/21  Delma Freeze, FNP  HYDROcodone-acetaminophen (NORCO/VICODIN) 5-325 MG tablet Take 1 tablet by mouth every 6 (six) hours as needed for moderate pain. 01/29/21   Stoioff, Verna Czech, MD  ketorolac (TORADOL) 10 MG tablet Take 1 tablet (10 mg total) by mouth every 6 (six) hours as needed. 12/26/20   Stoioff, Verna Czech, MD  oxybutynin (DITROPAN) 5 MG tablet 1 tab tid prn frequency,urgency, bladder spasm 01/29/21   Stoioff, Verna Czech, MD  tamsulosin (FLOMAX) 0.4 MG CAPS capsule Take 1 capsule (0.4 mg total) by mouth daily after breakfast. 01/29/21   Stoioff, Verna Czech, MD  Allergies Penicillins  Family History  Problem Relation Age of Onset   Hypertension Mother    Kidney disease Mother    Diabetes Mother    Mitral valve prolapse Mother    Diabetes Father    Hypertension Maternal Grandmother    Mitral valve prolapse Maternal Grandmother    Other Half-Brother        accident    Social History Social History   Tobacco Use   Smoking status: Former    Packs/day: 1.00    Years: 35.00    Pack years: 35.00    Types: Cigarettes    Quit date: 08/2020    Years since quitting: 0.6   Smokeless tobacco: Never  Vaping Use    Vaping Use: Some days   Substances: Nicotine, Flavoring  Substance Use Topics   Alcohol use: Yes    Alcohol/week: 6.0 standard drinks    Types: 6 Cans of beer per week   Drug use: Yes    Types: Marijuana, Cocaine, Other-see comments, "Crack" cocaine    Comment: last use of crack cocaine 01/24/18 and 01/14/18 last use 3-4 weeks ago (marijuana and cocaine)    Review of Systems  Constitutional: No fever/chills Eyes: No visual changes. ENT: No sore throat. Cardiovascular: Denies chest pain. Respiratory: Denies shortness of breath. Gastrointestinal: No abdominal pain.  No nausea, no vomiting.  diarrhea.  No constipation. Genitourinary: Negative for dysuria. Musculoskeletal: Negative for back pain. Skin: Negative for rash. Neurological: Negative for headaches, focal weakness   ____________________________________________   PHYSICAL EXAM:  VITAL SIGNS: ED Triage Vitals  Enc Vitals Group     BP 04/22/21 0942 (!) 155/55     Pulse Rate 04/22/21 0942 69     Resp 04/22/21 0942 16     Temp 04/22/21 0942 97.6 F (36.4 C)     Temp Source 04/22/21 0942 Oral     SpO2 04/22/21 0942 98 %     Weight 04/22/21 0943 165 lb (74.8 kg)     Height 04/22/21 0943 6\' 2"  (1.88 m)     Head Circumference --      Peak Flow --      Pain Score 04/22/21 0942 10     Pain Loc --      Pain Edu? --      Excl. in GC? --     Constitutional: Alert and oriented. Well appearing and in no acute distress. Eyes: Conjunctivae are normal.  Head: Atraumatic. Nose: No congestion/rhinnorhea. Mouth/Throat: Mucous membranes are moist.  Oropharynx non-erythematous. Neck: No stridor.  Cardiovascular: Normal rate, regular rhythm. Grossly normal heart sounds.  Good peripheral circulation. Respiratory: Normal respiratory effort.  No retractions. Lungs CTAB. Gastrointestinal: Soft and nontender. No distention. No abdominal bruits. No CVA tenderness. Musculoskeletal: No lower extremity tenderness nor edema.  He does have  callosities on the feet.  Once on the medial edge of his great toe and 1 and midfoot laterally. Neurologic:  Normal speech and language. No gross focal neurologic deficits are appreciated. No gait instability. Skin:  Skin is warm, dry and intact. No rash noted. Psychiatric: Mood and affect are normal. Speech and behavior are normal.  Both he and his wife saw the thing.  I cannot get it to manifest itself.  He says it usually happens if he puts his warm hand on his chest and then moves it.  His course is sitting and cold hallway so possibly he is too cold.  ____________________________________________   LABS (all labs ordered are listed, but  only abnormal results are displayed)  Labs Reviewed  CBC - Abnormal; Notable for the following components:      Result Value   WBC 3.5 (*)    All other components within normal limits  CBC WITH DIFFERENTIAL/PLATELET - Abnormal; Notable for the following components:   WBC 3.5 (*)    All other components within normal limits  C DIFFICILE QUICK SCREEN W PCR REFLEX    GASTROINTESTINAL PANEL BY PCR, STOOL (REPLACES STOOL CULTURE)  LIPASE, BLOOD  COMPREHENSIVE METABOLIC PANEL  URINALYSIS, ROUTINE W REFLEX MICROSCOPIC   ____________________________________________  EKG   ____________________________________________  RADIOLOGY Jill Poling, personally viewed and evaluated these images (plain radiographs) as part of my medical decision making, as well as reviewing the written report by the radiologist.  ED MD interpretation: Chest x-ray read by radiology reviewed by me shows no active disease CT chest read by radiology reviewed by me shows no acute pathology there is some widening of the aorta that will need follow-up at that.  Official radiology report(s): DG Chest 2 View  Result Date: 04/22/2021 CLINICAL DATA:  Patient says there is an animal moving around in the skin under his chest he thinks it is a worm EXAM: CHEST - 2 VIEW COMPARISON:   09/15/2020 FINDINGS: The heart size and mediastinal contours are within normal limits. Both lungs are clear. The visualized skeletal structures are unremarkable. IMPRESSION: No active cardiopulmonary disease. Electronically Signed   By: Charlett Nose M.D.   On: 04/22/2021 13:54   CT Chest W Contrast  Result Date: 04/22/2021 CLINICAL DATA:  Soft tissue mass, chest, US/xray nondiagnostic. Patient reports episodes of something poking out like it is trying to come out through the skin. EXAM: CT CHEST WITH CONTRAST TECHNIQUE: Multidetector CT imaging of the chest was performed during intravenous contrast administration. CONTRAST:  57mL OMNIPAQUE IOHEXOL 300 MG/ML  SOLN COMPARISON:  Same date chest x-ray, CT 08/24/2020 FINDINGS: Cardiovascular: Heart size is upper limits of normal. No pericardial effusion. Main pulmonary trunk is mildly dilated at 3.5 cm in diameter. Ascending thoracic aorta measures 4.0 cm in diameter (series 5, image 52). Common origin of the brachiocephalic and left common carotid arteries, an anatomic variant. Mild atherosclerotic calcifications of the aorta and coronary arteries. Mediastinum/Nodes: No enlarged mediastinal, hilar, or axillary lymph nodes. Thyroid gland, trachea, and esophagus demonstrate no significant findings. Lungs/Pleura: Lungs are clear. No pleural effusion or pneumothorax. Upper Abdomen: No acute abnormality. Musculoskeletal: No chest wall abnormality. No acute or significant osseous findings. IMPRESSION: 1. No abnormality or mass of the chest wall. 2. Lungs are clear. 3. Mildly dilated ascending thoracic aorta measuring up to 4.0 cm in diameter. Recommend annual imaging followup by CTA or MRA. This recommendation follows 2010 ACCF/AHA/AATS/ACR/ASA/SCA/SCAI/SIR/STS/SVM Guidelines for the Diagnosis and Management of Patients with Thoracic Aortic Disease. Circulation. 2010; 121: G182-X937. Aortic aneurysm NOS (ICD10-I71.9) 4. Mildly dilated main pulmonary trunk, which can be  seen with pulmonary arterial hypertension. 5. Aortic and coronary artery atherosclerosis (ICD10-I70.0). Electronically Signed   By: Duanne Guess D.O.   On: 04/22/2021 15:10    ____________________________________________   PROCEDURES  Procedure(s) performed (including Critical Care):  Procedures   ____________________________________________   INITIAL IMPRESSION / ASSESSMENT AND PLAN / ED COURSE  I am not sure what was causing the patient's appearance of something trying to come out through the skin but there is nothing on CT or chest x-ray he is not having any elevated levels of eosinophilia is white count is okay liver  functions are within normal limits.  I have sent the stool specimen we can see if anything comes back from that.  His stool today was formed.  We can call him back with the results of the stool specimen if anything does show up or he can call and check on them.  We will have him follow-up with his primary care doctor as well.  I believe he can be cleared to return to work.             ____________________________________________   FINAL CLINICAL IMPRESSION(S) / ED DIAGNOSES  Final diagnoses:  Diarrhea, unspecified type     ED Discharge Orders     None        Note:  This document was prepared using Dragon voice recognition software and may include unintentional dictation errors.    Arnaldo Natal, MD 04/22/21 [phone]

## 2021-04-22 NOTE — Discharge Instructions (Addendum)
I do not have the results of the stool specimen back yet.  If you call back to the emergency department tomorrow we should be able to provide you with those results.  The chest CT showed some widening of the aorta the big blood vessel that comes out of your heart.  We will need to have this followed.  The recommendation is to have a repeat CTA or MRA every year.  I would like you to follow-up with your regular doctor both to keep an eye on you and to double check on the stool specimen follow-up and most importantly to check on the aorta dilation and make sure you get your follow-up as needed.  I believe you can return to work.  There does not seem to be any reason to suspect any contagion.  Have insurance you can follow-up with the [facility] or the [facility] or the [facility] medical group or the [facility].  If you do not have insurance you can try to follow-up with [facility] charity care or with the [facility] clinic or the [facility] or the [facility] or [facility] or the [facility].

## 2021-04-22 NOTE — ED Triage Notes (Signed)
Pt states "I have tape worms". Pt c/o abd pain, loose stools for the past 6 months, per pt wife, she state she saw something moving around under his skin on his chest that it was going to poke through. States he thinks it is from under cooked pork

## 2021-04-28 NOTE — Progress Notes (Deleted)
Lft   Patient ID: Darius [name], male    DOB: 1970-06-20, 51 y.o.   MRN: [id number]  HPI  Darius [name] is a 51 y/o male with a history of HTN, HF, current tobacco, alcohol and drug use.   Echo report from 12/19/17 reviewed and showed an EF of 60-65% along with mild Darius and mildly elevated PA pressure of 44 mm Hg.   Was in the ED 04/22/21 due to diarrhea where he was evaluated and released. Was in the ED 04/03/21 but LWBS. Was in the ED 09/15/20 due to myalgia and weakness. Diagnosed with covid. Evaluated and released.   He presents today for an urgent visit with a chief complaint of   Past Medical History:  Diagnosis Date   CHF (congestive heart failure) (HCC)    a.) TTE on 12/19/2017 --> LVEF 60-65%, no RWMAs, mild LA dilitation, PASP 44 mmHg   COPD (chronic obstructive pulmonary disease) (HCC)    DOE (dyspnea on exertion)    Glaucoma    History of 2019 novel coronavirus disease (COVID-19) 09/15/2020   Hypertension    Moderate concentric left ventricular hypertrophy 12/19/2017   Nephrolithiasis    Polysubstance abuse (HCC)    ETOH, marijuana, cocaine   Seizures (HCC)    T2DM (type 2 diabetes mellitus) (HCC)    a.)  Controlled with diet lifestyle modifications   Valvular heart disease    a.) TTE on 12/19/2017 --> mild MV regurgitation; moderate AV regurgitation   Past Surgical History:  Procedure Laterality Date   CYSTOSCOPY/URETEROSCOPY/HOLMIUM LASER/STENT PLACEMENT Right 01/29/2021   Procedure: CYSTOSCOPY/URETEROSCOPY/HOLMIUM LASER/STENT PLACEMENT;  Surgeon: Riki Altes, MD;  Location: [facility] ORS;  Service: Urology;  Laterality: Right;   NO PAST SURGERIES     Family History  Problem Relation Age of Onset   Hypertension Mother    Kidney disease Mother    Diabetes Mother    Mitral valve prolapse Mother    Diabetes Father    Hypertension Maternal Grandmother    Mitral valve prolapse Maternal Grandmother    Other Half-Brother        accident   Social History   Tobacco Use    Smoking status: Former    Packs/day: 1.00    Years: 35.00    Pack years: 35.00    Types: Cigarettes    Quit date: 08/2020    Years since quitting: 0.6   Smokeless tobacco: Never  Substance Use Topics   Alcohol use: Yes    Alcohol/week: 6.0 standard drinks    Types: 6 Cans of beer per week   Allergies  Allergen Reactions   Penicillins Other (See Comments)    Patient states that mother is highly allergic. 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  Has patient had a PCN reaction causing immediate rash, facial/tongue/throat swelling, SOB or lightheadedness with hypotension: No Has patient had a PCN reaction causing severe rash involving mucus membranes or skin necrosis: No Has patient had a PCN reaction that required hospitalization Yes Has patient had a PCN reaction occurring within the last 10 years: No If all of the above answers are "N     Review of Systems  Constitutional:  Positive for fatigue. Negative for appetite change.  HENT:  Negative for congestion, postnasal drip and sore throat.   Eyes: Negative.   Respiratory:  Positive for cough and shortness of breath (easily). Negative for apnea.   Cardiovascular:  Positive for leg swelling (couple of days ago). Negative for chest  pain and palpitations.  Gastrointestinal:  Positive for abdominal pain (right lower side) and diarrhea (loose stools). Negative for abdominal distention.  Endocrine: Negative.   Genitourinary:  Negative for difficulty urinating and dysuria.  Musculoskeletal:  Positive for back pain (right lower back). Negative for neck pain.  Skin: Negative.   Allergic/Immunologic: Negative.   Neurological:  Positive for light-headedness (at times). Negative for dizziness.  Hematological:  Negative for adenopathy. Does not bruise/bleed easily.  Psychiatric/Behavioral:  Positive for sleep disturbance (due to abdominal pain/ shortness of breath). Negative for dysphoric mood. The patient is nervous/anxious.        Physical Exam Vitals and nursing note reviewed. Exam conducted with a chaperone present (wife).  Constitutional:      Appearance: Normal appearance.  HENT:     Head: Normocephalic and atraumatic.  Cardiovascular:     Rate and Rhythm: Normal rate and regular rhythm.  Pulmonary:     Effort: Pulmonary effort is normal. No respiratory distress.     Breath sounds: Rales (few right lower lobe) present. No wheezing.  Abdominal:     General: Abdomen is flat. There is no distension.     Palpations: Abdomen is soft.     Tenderness: There is abdominal tenderness (right upper quadrant).  Musculoskeletal:        General: No tenderness.     Cervical back: Normal range of motion and neck supple.     Right lower leg: No edema.     Left lower leg: No edema.  Skin:    General: Skin is warm and dry.  Neurological:     General: No focal deficit present.     Mental Status: He is alert and oriented to person, place, and time.  Psychiatric:        Mood and Affect: Mood normal.        Behavior: Behavior normal.        Thought Content: Thought content normal.   Assessment & Plan:  1: Chronic heart failure with preserved ejection fraction without structural changes- - NYHA class III - euvolemic today - weighing daily & he was reminded to call for an overnight weight gain of >2 pounds or a weekly weight gain of >5 pounds - weight 157 from last visit here 4 months ago  - not adding salt to his food & reminded to closely follow a low sodium diet - BNP 12/18/17 was 842.0  2: HTN- - BP - saw PCP at [facility] 11/21/20 - BMP on 04/22/21 reviewed and showed sodium 141, potassium 4.1, creatinine 1.24 and GFR >60  3: Tobacco use- - smokes 1/3 ppd of cigarettes  - complete cessation discussed for 3 minutes with him   Patient did not bring his medications nor a list. Each medication was verbally reviewed with the patient and he was encouraged to bring the bottles to every visit to confirm  accuracy of list.

## 2021-04-30 ENCOUNTER — Ambulatory Visit: Payer: Self-pay | Admitting: Family

## 2021-05-15 ENCOUNTER — Telehealth: Payer: Self-pay | Admitting: Family

## 2021-05-15 ENCOUNTER — Ambulatory Visit: Payer: Self-pay | Admitting: Family

## 2021-05-15 ENCOUNTER — Encounter: Payer: Self-pay | Admitting: Pharmacist

## 2021-05-15 NOTE — Telephone Encounter (Signed)
Patient did not show for his [facility] appointment on 05/15/21. Will attempt to reschedule.

## 2021-05-29 ENCOUNTER — Other Ambulatory Visit: Payer: Self-pay

## 2021-05-29 ENCOUNTER — Encounter: Payer: Self-pay | Admitting: Gerontology

## 2021-05-29 ENCOUNTER — Ambulatory Visit: Payer: Self-pay | Admitting: Gerontology

## 2021-05-29 VITALS — BP 135/66 | HR 70 | Temp 97.9°F | Ht 74.0 in | Wt 157.3 lb

## 2021-05-29 DIAGNOSIS — I77810 Thoracic aortic ectasia: Secondary | ICD-10-CM | POA: Insufficient documentation

## 2021-05-29 DIAGNOSIS — Z8659 Personal history of other mental and behavioral disorders: Secondary | ICD-10-CM | POA: Insufficient documentation

## 2021-05-29 DIAGNOSIS — R0602 Shortness of breath: Secondary | ICD-10-CM

## 2021-05-29 DIAGNOSIS — Z0000 Encounter for general adult medical examination without abnormal findings: Secondary | ICD-10-CM

## 2021-05-29 MED ORDER — ALBUTEROL SULFATE HFA 108 (90 BASE) MCG/ACT IN AERS: 2.0000 | INHALATION_SPRAY | Freq: Four times a day (QID) | RESPIRATORY_TRACT | 3 refills | Status: DC | PRN

## 2021-05-29 NOTE — Progress Notes (Signed)
Established Patient Office Visit  Subjective:  Patient ID: Darius [name], male    DOB: [birth year]  Age: 51 y.o. MRN: [id number]  CC:  Chief Complaint  Patient presents with   Follow-up    General follow-up. Recovering from Flu two weeks ago    HPI Darius [name]   is a 51 year old male with a PMH of CHF, Diabetes, Glaucoma and  HTN,presents for routine follow up visit and medication refill. He states that he's compliant with his medications, denies side effects and continues to make healthy lifestyle changes. He states that he's recovering from Influenza . He states that he has being experiencing panic attacks that is concerning sometimes and requests mental health evaluation. He states that his mood is good, denies suicidal nor homicidal ideation. His chest CT scan done on 04/22/21 showed  Mildly dilated ascending thoracic aorta measuring up to 4.0 cm in  diameter. Recommend annual imaging followup by CTA or MRA. This recommendation follows 2010 ACCF/AHA/AATS/ACR/ASA/SCA/SCAI/SIR/STS/SVM Guidelines for the Diagnosis and Management of Patients with Thoracic Aortic Disease. Circulation. 2010; 121: N407-W808. Aortic aneurysm NOS  ICD10-I71.9)Overall, he states that he's doing well and offers no further complaint.   Past Medical History:  Diagnosis Date   CHF (congestive heart failure) ([place])    a.) TTE on 12/19/2017 --> LVEF 60-65%, no RWMAs, mild LA dilitation, PASP 44 mmHg   COPD (chronic obstructive pulmonary disease) (HCC)    DOE (dyspnea on exertion)    Glaucoma    History of 2019 novel coronavirus disease (COVID-19) 09/15/2020   Hypertension    Moderate concentric left ventricular hypertrophy 12/19/2017   Nephrolithiasis    Polysubstance abuse ([place])    ETOH, marijuana, cocaine   Seizures ([place])    T2DM (type 2 diabetes mellitus) ([place])    a.)  Controlled with diet lifestyle modifications   Valvular heart disease    a.) TTE on 12/19/2017 --> mild MV regurgitation; moderate AV  regurgitation    Past Surgical History:  Procedure Laterality Date   CYSTOSCOPY/URETEROSCOPY/HOLMIUM LASER/STENT PLACEMENT Right 01/29/2021   Procedure: CYSTOSCOPY/URETEROSCOPY/HOLMIUM LASER/STENT PLACEMENT;  Surgeon: Abbie Sons, MD;  Location: [facility] OR[facility];  Service: Urology;  Laterality: Right;   NO PAST SURGERIES      Family History  Problem Relation Age of Onset   Hypertension Mother    Kidney disease Mother    Diabetes Mother    Mitral valve prolapse Mother    Diabetes Father    Hypertension Maternal Grandmother    Mitral valve prolapse Maternal Grandmother    Other Half-Brother        accident    Social History   Socioeconomic History   Marital status: Legally Separated    Spouse name: Not on file   Number of children: Not on file   Years of education: Not on file   Highest education level: Not on file  Occupational History   Occupation: disability  Tobacco Use   Smoking status: Every Day    Packs/day: 0.25    Years: 35.00    Pack years: 8.75    Types: Cigarettes    Last attempt to quit: 08/2020    Years since quitting: 0.7   Smokeless tobacco: Never  Vaping Use   Vaping Use: Former   Quit date: 05/12/2021   Substances: Nicotine, Flavoring  Substance and Sexual Activity   Alcohol use: Yes    Alcohol/week: 2.0 standard drinks    Types: 2 Cans of beer per week   Drug use: Yes  Types: Marijuana, Cocaine, Other-see comments, "Crack" cocaine    Comment: last use of crack cocaine 01/2021 current use of marijuana   Sexual activity: Yes  Other Topics Concern   Not on file  Social History Narrative   Not on file   Social Determinants of Health   Financial Resource Strain: Not on file  Food Insecurity: No Food Insecurity   Worried About Running Out of Food in the Last Year: Never true   Ran Out of Food in the Last Year: Never true  Transportation Needs: No Transportation Needs   Lack of Transportation (Medical): No   Lack of Transportation  (Non-Medical): No  Physical Activity: Not on file  Stress: Not on file  Social Connections: Not on file  Intimate Partner Violence: Not on file    Outpatient Medications Prior to Visit  Medication Sig Dispense Refill   aspirin EC 81 MG tablet Take 81 mg by mouth daily. Swallow whole. (Patient not taking: Reported on 05/29/2021)     furosemide (LASIX) 20 MG tablet Take 1 tablet (20 mg total) by mouth daily. 90 tablet 3   HYDROcodone-acetaminophen (NORCO/VICODIN) 5-325 MG tablet Take 1 tablet by mouth every 6 (six) hours as needed for moderate pain. 12 tablet 0   ketorolac (TORADOL) 10 MG tablet Take 1 tablet (10 mg total) by mouth every 6 (six) hours as needed. 20 tablet 0   albuterol (VENTOLIN HFA) 108 (90 Base) MCG/ACT inhaler Inhale 2 puffs into the lungs every 6 (six) hours as needed for wheezing or shortness of breath. (Patient not taking: Reported on 05/29/2021) 8.5 g 3   oxybutynin (DITROPAN) 5 MG tablet 1 tab tid prn frequency,urgency, bladder spasm 15 tablet 0   tamsulosin (FLOMAX) 0.4 MG CAPS capsule Take 1 capsule (0.4 mg total) by mouth daily after breakfast. 7 capsule 0   No facility-administered medications prior to visit.    Allergies  Allergen Reactions   Penicillins Other (See Comments)    Patient states that mother is highly allergic. 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  Has patient had a PCN reaction causing immediate rash, facial/tongue/throat swelling, SOB or lightheadedness with hypotension: No Has patient had a PCN reaction causing severe rash involving mucus membranes or skin necrosis: No Has patient had a PCN reaction that required hospitalization Yes Has patient had a PCN reaction occurring within the last 10 years: No If all of the above answers are "N    ROS Review of Systems  Constitutional: Negative.   HENT: Negative.    Eyes: Negative.   Respiratory: Negative.    Cardiovascular: Negative.   Neurological: Negative.    Psychiatric/Behavioral: Negative.       Objective:    Physical Exam HENT:     Head: Normocephalic and atraumatic.     Mouth/Throat:     Mouth: Mucous membranes are moist.  Eyes:     Extraocular Movements: Extraocular movements intact.     Conjunctiva/sclera: Conjunctivae normal.     Pupils: Pupils are equal, round, and reactive to light.  Cardiovascular:     Rate and Rhythm: Normal rate and regular rhythm.     Heart sounds: Normal heart sounds.  Pulmonary:     Effort: Pulmonary effort is normal.     Breath sounds: Normal breath sounds.  Abdominal:     General: Abdomen is flat. Bowel sounds are normal.     Palpations: Abdomen is soft.  Skin:    General: Skin is warm.  Neurological:  General: No focal deficit present.     Mental Status: He is alert and oriented to person, place, and time. Mental status is at baseline.  Psychiatric:        Mood and Affect: Mood normal.        Behavior: Behavior normal.        Thought Content: Thought content normal.        Judgment: Judgment normal.    BP 135/66 (BP Location: Right Arm, Patient Position: Sitting, Cuff Size: Normal)    Pulse 70    Temp 97.9 F (36.6 C)    Ht _0  (1.88 m)    Wt 157 lb 4.8 oz (71.4 kg)    SpO2 96%    BMI 20.20 kg/m  Wt Readings from Last 3 Encounters:  05/29/21 157 lb 4.8 oz (71.4 kg)  04/22/21 165 lb (74.8 kg)  02/07/21 160 lb (72.6 kg)     Health Maintenance Due  Topic Date Due   COVID-19 Vaccine (1) Never done   FOOT EXAM  Never done   OPHTHALMOLOGY EXAM  Never done   URINE MICROALBUMIN  Never done   Hepatitis C Screening  Never done   TETANUS/TDAP  Never done   Zoster Vaccines- Shingrix (1 of 2) Never done   COLONOSCOPY (Pts 45-4yr Insurance coverage will need to be confirmed)  Never done   HEMOGLOBIN A1C  01/18/2021    There are no preventive care reminders to display for this patient.  Lab Results  Component Value Date   TSH 0.758 12/18/2017   Lab Results  Component Value Date    WBC 3.5 (L) 04/22/2021   WBC 3.5 (L) 04/22/2021   HGB 13.7 04/22/2021   HGB 13.8 04/22/2021   HCT 40.5 04/22/2021   HCT 40.4 04/22/2021   MCV 94.0 04/22/2021   MCV 94.2 04/22/2021   PLT 189 04/22/2021   PLT 212 04/22/2021   Lab Results  Component Value Date   NA 141 04/22/2021   K 4.1 04/22/2021   CO2 27 04/22/2021   GLUCOSE 99 04/22/2021   BUN 15 04/22/2021   CREATININE 1.24 04/22/2021   BILITOT 0.9 04/22/2021   ALKPHOS 66 04/22/2021   AST 30 04/22/2021   ALT 20 04/22/2021   PROT 6.9 04/22/2021   ALBUMIN 4.0 04/22/2021   CALCIUM 9.2 04/22/2021   ANIONGAP 6 04/22/2021   EGFR 76 07/18/2020   Lab Results  Component Value Date   CHOL 144 07/18/2020   Lab Results  Component Value Date   HDL 52 07/18/2020   Lab Results  Component Value Date   LDLCALC 73 07/18/2020   Lab Results  Component Value Date   TRIG 102 07/18/2020   Lab Results  Component Value Date   CHOLHDL 2.8 07/18/2020   Lab Results  Component Value Date   HGBA1C 4.9 07/18/2020      Assessment & Plan:     1. Health care maintenance - Routine labs will be checked. - HgB A1c; Future - Urine Microalbumin w/creat. ratio; Future - Urine Microalbumin w/creat. ratio - HgB A1c  2. History of panic attacks - [facility] H[facility] [place] He was advised to call Crisis help line with worsening symptoms.  3. Shortness of breath - He will continue on current medication as needed. - albuterol (VENTOLIN HFA) 108 (90 Base) MCG/ACT inhaler; Inhale 2 puffs into the lungs every 6 (six) hours as needed for wheezing or shortness of breath.  Dispense: 8.5 g; Refill: 3  4. Ascending aorta dilatation (HCC) - He was advised to complete [facility] financial application for  - Ambulatory referral to Cardiothoracic Surgery to evaluate dilated ascending thoracic aorta.    Follow-up: Return in about 3 months (around 08/27/2021), or if symptoms worsen or fail to  improve.    Darius [name] Jerold Coombe, NP

## 2021-05-30 LAB — HEMOGLOBIN A1C
Est. average glucose Bld gHb Est-mCnc: 105 mg/dL
Hgb A1c MFr Bld: 5.3 % (ref 4.8–5.6)

## 2021-05-30 LAB — MICROALBUMIN / CREATININE URINE RATIO
Creatinine, Urine: 137.9 mg/dL
Microalb/Creat Ratio: 3 mg/g creat (ref 0–29)
Microalbumin, Urine: 4.8 ug/mL

## 2021-06-04 ENCOUNTER — Institutional Professional Consult (permissible substitution): Payer: Self-pay | Admitting: Licensed Clinical Social Worker

## 2021-06-10 NOTE — Progress Notes (Unsigned)
Lft   Patient ID: Darius [name], male    DOB: [birth year], 51 y.o.   MRN: [id number]  HPI  Mr Gallien is a 51 y/o male with a history of HTN, HF, current tobacco, alcohol and drug use.   Echo report from 12/19/17 reviewed and showed an EF of 60-65% along with mild MR and mildly elevated PA pressure of 44 mm Hg.   Was in the ED 04/22/21 due to abdominal pain and diarrhea. Stool sample collected and he was released after evaluation was negative.   He presents today for a follow-up visit with a chief complaint of   Past Medical History:  Diagnosis Date   CHF (congestive heart failure) (HCC)    a.) TTE on 12/19/2017 --> LVEF 60-65%, no RWMAs, mild LA dilitation, PASP 44 mmHg   COPD (chronic obstructive pulmonary disease) (HCC)    DOE (dyspnea on exertion)    Glaucoma    History of 2019 novel coronavirus disease (COVID-19) 09/15/2020   Hypertension    Moderate concentric left ventricular hypertrophy 12/19/2017   Nephrolithiasis    Polysubstance abuse (HCC)    ETOH, marijuana, cocaine   Seizures (HCC)    T2DM (type 2 diabetes mellitus) (HCC)    a.)  Controlled with diet lifestyle modifications   Valvular heart disease    a.) TTE on 12/19/2017 --> mild MV regurgitation; moderate AV regurgitation   Past Surgical History:  Procedure Laterality Date   CYSTOSCOPY/URETEROSCOPY/HOLMIUM LASER/STENT PLACEMENT Right 01/29/2021   Procedure: CYSTOSCOPY/URETEROSCOPY/HOLMIUM LASER/STENT PLACEMENT;  Surgeon: Riki Altes, MD;  Location: [facility] ORS;  Service: Urology;  Laterality: Right;   NO PAST SURGERIES     Family History  Problem Relation Age of Onset   Hypertension Mother    Kidney disease Mother    Diabetes Mother    Mitral valve prolapse Mother    Diabetes Father    Hypertension Maternal Grandmother    Mitral valve prolapse Maternal Grandmother    Other Half-Brother        accident   Social History   Tobacco Use   Smoking status: Every Day    Packs/day: 0.25    Years: 35.00     Pack years: 8.75    Types: Cigarettes    Last attempt to quit: 08/2020    Years since quitting: 0.7   Smokeless tobacco: Never  Substance Use Topics   Alcohol use: Yes    Alcohol/week: 2.0 standard drinks    Types: 2 Cans of beer per week   Allergies  Allergen Reactions   Penicillins Other (See Comments)    Patient states that mother is highly allergic. 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  Has patient had a PCN reaction causing immediate rash, facial/tongue/throat swelling, SOB or lightheadedness with hypotension: No Has patient had a PCN reaction causing severe rash involving mucus membranes or skin necrosis: No Has patient had a PCN reaction that required hospitalization Yes Has patient had a PCN reaction occurring within the last 10 years: No If all of the above answers are "N     Review of Systems  Constitutional:  Positive for fatigue. Negative for appetite change.  HENT:  Negative for congestion, postnasal drip and sore throat.   Eyes: Negative.   Respiratory:  Positive for cough and shortness of breath (easily). Negative for apnea.   Cardiovascular:  Positive for leg swelling (couple of days ago). Negative for chest pain and palpitations.  Gastrointestinal:  Positive for abdominal pain (right lower  side) and diarrhea (loose stools). Negative for abdominal distention.  Endocrine: Negative.   Genitourinary:  Negative for difficulty urinating and dysuria.  Musculoskeletal:  Positive for back pain (right lower back). Negative for neck pain.  Skin: Negative.   Allergic/Immunologic: Negative.   Neurological:  Positive for light-headedness (at times). Negative for dizziness.  Hematological:  Negative for adenopathy. Does not bruise/bleed easily.  Psychiatric/Behavioral:  Positive for sleep disturbance (due to abdominal pain/ shortness of breath). Negative for dysphoric mood. The patient is nervous/anxious.       Physical Exam Vitals and nursing note  reviewed. Exam conducted with a chaperone present (wife).  Constitutional:      Appearance: Normal appearance.  HENT:     Head: Normocephalic and atraumatic.  Cardiovascular:     Rate and Rhythm: Normal rate and regular rhythm.  Pulmonary:     Effort: Pulmonary effort is normal. No respiratory distress.     Breath sounds: Rales (few right lower lobe) present. No wheezing.  Abdominal:     General: Abdomen is flat. There is no distension.     Palpations: Abdomen is soft.     Tenderness: There is abdominal tenderness (right upper quadrant).  Musculoskeletal:        General: No tenderness.     Cervical back: Normal range of motion and neck supple.     Right lower leg: No edema.     Left lower leg: No edema.  Skin:    General: Skin is warm and dry.  Neurological:     General: No focal deficit present.     Mental Status: He is alert and oriented to person, place, and time.  Psychiatric:        Mood and Affect: Mood normal.        Behavior: Behavior normal.        Thought Content: Thought content normal.   Assessment & Plan:  1: Chronic heart failure with preserved ejection fraction without structural changes- - NYHA class III - euvolemic today - weighing daily & he was reminded to call for an overnight weight gain of >2 pounds or a weekly weight gain of >5 pounds - weight 157 from last visit here 6 months ago   - not adding salt to his food & reminded to closely follow a low sodium diet - BNP 12/18/17 was 842.0  2: HTN- - BP  - saw PCP at [facility] 05/29/21 - BMP on 04/22/21 reviewed and showed sodium 141, potassium 4.1, creatinine 1.24 and GFR >60  3: Tobacco use- - smokes 1/3 ppd of cigarettes  - complete cessation discussed for 3 minutes with him   Patient did not bring his medications nor a list. Each medication was verbally reviewed with the patient and he was encouraged to bring the bottles to every visit to confirm accuracy of list.

## 2021-06-12 ENCOUNTER — Ambulatory Visit: Payer: Self-pay | Admitting: Family

## 2021-06-13 ENCOUNTER — Institutional Professional Consult (permissible substitution): Payer: Self-pay | Admitting: Licensed Clinical Social Worker

## 2021-06-18 ENCOUNTER — Telehealth: Payer: Self-pay | Admitting: Licensed Clinical Social Worker

## 2021-06-18 ENCOUNTER — Ambulatory Visit: Payer: Self-pay | Admitting: Licensed Clinical Social Worker

## 2021-06-18 NOTE — Telephone Encounter (Signed)
Called the patient during today's scheduled appointment; no answer, left a voicemail with the clinic contact information so they may reschedule.   

## 2021-06-19 ENCOUNTER — Institutional Professional Consult (permissible substitution): Payer: Self-pay

## 2021-06-19 NOTE — Patient Instructions (Signed)
Stop smoking immediately and permanently.   Patient is counseled regarding the importance of long term risk factor modification as they pertain to the presence of ischemic heart disease including avoiding the use of all tobacco products, dietary modifications and medical therapy for diabetes, cholesterol and lipid management, and regular exercise.    Make every effort to maintain a "heart-healthy" lifestyle with regular physical exercise and adherence to a low-fat, low-carbohydrate diet.  Continue to seek regular follow-up appointments with your primary care physician and/or cardiologist.  AVOID FLUOROQUINOLONES... this is a class of antibiotic that can cause you increase risk of Aortic Dissection

## 2021-06-25 ENCOUNTER — Ambulatory Visit: Payer: Self-pay | Admitting: Family

## 2021-07-03 NOTE — Progress Notes (Deleted)
Patient ID: Darius [name], male    DOB: [birth year], 51 y.o.   MRN: [id number]  HPI  Darius [name] is a 51 y/o male with a history of HTN, HF, current tobacco, alcohol and drug use.   Echo report from 12/19/17 reviewed and showed an EF of 60-65% along with mild Darius and mildly elevated PA pressure of 44 mm Hg.   Was in the ED 09/15/20 due to myalgia and weakness. Diagnosed with covid. Evaluated and released.    In the ED 12/25/20 for kidney stone, discharged and advised to f/u with urology and PCP.   In the ED 04/22/21 for diarrhea.  He presents today for an urgent visit with a chief complaint of moderate shortness of breath upon minimal exertion. He describes this as chronic in nature having been present for several years although says that his breathing has worsened over the last few days or so. He has associated fatigue, cough, abdominal pain, diarrhea, back pain, difficulty sleeping, anxiety and light-headedness along with this.  He denies abdominal distention, palpitations, chest pain or weight gain.   He says that he's developed RLQ pain over the last several days radiating around his side towards his right lower back. No urinary symptoms. Pain has become so bad that he actually called 911 last night, was evaluated and decided to not go to the ED last night.   Past Medical History:  Diagnosis Date   CHF (congestive heart failure) (HCC)    a.) TTE on 12/19/2017 --> LVEF 60-65%, no RWMAs, mild LA dilitation, PASP 44 mmHg   COPD (chronic obstructive pulmonary disease) (HCC)    DOE (dyspnea on exertion)    Glaucoma    History of 2019 novel coronavirus disease (COVID-19) 09/15/2020   Hypertension    Moderate concentric left ventricular hypertrophy 12/19/2017   Nephrolithiasis    Polysubstance abuse (HCC)    ETOH, marijuana, cocaine   Seizures (HCC)    T2DM (type 2 diabetes mellitus) (HCC)    a.)  Controlled with diet lifestyle modifications   Valvular heart disease    a.) TTE on  12/19/2017 --> mild MV regurgitation; moderate AV regurgitation   Past Surgical History:  Procedure Laterality Date   CYSTOSCOPY/URETEROSCOPY/HOLMIUM LASER/STENT PLACEMENT Right 01/29/2021   Procedure: CYSTOSCOPY/URETEROSCOPY/HOLMIUM LASER/STENT PLACEMENT;  Surgeon: Riki Altes, MD;  Location: [facility] ORS;  Service: Urology;  Laterality: Right;   NO PAST SURGERIES     Family History  Problem Relation Age of Onset   Hypertension Mother    Kidney disease Mother    Diabetes Mother    Mitral valve prolapse Mother    Diabetes Father    Hypertension Maternal Grandmother    Mitral valve prolapse Maternal Grandmother    Other Half-Brother        accident   Social History   Tobacco Use   Smoking status: Every Day    Packs/day: 0.25    Years: 35.00    Pack years: 8.75    Types: Cigarettes    Last attempt to quit: 08/2020    Years since quitting: 0.8   Smokeless tobacco: Never  Substance Use Topics   Alcohol use: Yes    Alcohol/week: 2.0 standard drinks    Types: 2 Cans of beer per week   Allergies  Allergen Reactions   Penicillins Other (See Comments)    Patient states that mother is highly allergic. 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  Has patient had a PCN reaction  causing immediate rash, facial/tongue/throat swelling, SOB or lightheadedness with hypotension: No Has patient had a PCN reaction causing severe rash involving mucus membranes or skin necrosis: No Has patient had a PCN reaction that required hospitalization Yes Has patient had a PCN reaction occurring within the last 10 years: No If all of the above answers are "N   Prior to Admission medications   Medication Sig Start Date End Date Taking? Authorizing Provider  albuterol (VENTOLIN HFA) 108 (90 Base) MCG/ACT inhaler Inhale 2 puffs into the lungs every 6 (six) hours as needed for wheezing or shortness of breath. 08/15/20  Yes Iloabachie, Chioma E, NP  aspirin EC 81 MG tablet Take 81 mg by mouth  daily. Swallow whole.   Yes [provider]    Review of Systems  Constitutional:  Positive for fatigue. Negative for appetite change.  HENT:  Negative for congestion, postnasal drip and sore throat.   Eyes: Negative.   Respiratory:  Positive for cough and shortness of breath (easily). Negative for apnea.   Cardiovascular:  Positive for leg swelling (couple of days ago). Negative for chest pain and palpitations.  Gastrointestinal:  Positive for abdominal pain (right lower side) and diarrhea (loose stools). Negative for abdominal distention.  Endocrine: Negative.   Genitourinary:  Negative for difficulty urinating and dysuria.  Musculoskeletal:  Positive for back pain (right lower back). Negative for neck pain.  Skin: Negative.   Allergic/Immunologic: Negative.   Neurological:  Positive for light-headedness (at times). Negative for dizziness.  Hematological:  Negative for adenopathy. Does not bruise/bleed easily.  Psychiatric/Behavioral:  Positive for sleep disturbance (due to abdominal pain/ shortness of breath). Negative for dysphoric mood. The patient is nervous/anxious.    There were no vitals filed for this visit.  Wt Readings from Last 3 Encounters:  05/29/21 157 lb 4.8 oz (71.4 kg)  04/22/21 165 lb (74.8 kg)  02/07/21 160 lb (72.6 kg)   Lab Results  Component Value Date   CREATININE 1.24 04/22/2021   CREATININE 1.10 01/29/2021   CREATININE 1.32 (H) 12/25/2020    Physical Exam Vitals and nursing note reviewed. Exam conducted with a chaperone present (wife).  Constitutional:      Appearance: Normal appearance.  HENT:     Head: Normocephalic and atraumatic.  Cardiovascular:     Rate and Rhythm: Normal rate and regular rhythm.  Pulmonary:     Effort: Pulmonary effort is normal. No respiratory distress.     Breath sounds: Rales (few right lower lobe) present. No wheezing.  Abdominal:     General: Abdomen is flat. There is no distension.     Palpations: Abdomen  is soft.     Tenderness: There is abdominal tenderness (right upper quadrant).  Musculoskeletal:        General: No tenderness.     Cervical back: Normal range of motion and neck supple.     Right lower leg: No edema.     Left lower leg: No edema.  Skin:    General: Skin is warm and dry.  Neurological:     General: No focal deficit present.     Mental Status: He is alert and oriented to person, place, and time.  Psychiatric:        Mood and Affect: Mood normal.        Behavior: Behavior normal.        Thought Content: Thought content normal.   Assessment & Plan:  1: Chronic heart failure with preserved ejection fraction without structural changes- -  NYHA class III - euvolemic today - weighing daily & he was reminded to call for an overnight weight gain of >2 pounds or a weekly weight gain of >5 pounds - weight stable from last visit here 5 weeks ago  - will add 20mg  furosemide daily do to rales in RLL - not adding salt to his food & reminded to closely follow a low sodium diet - BNP 12/18/17 was 842.0  2: HTN- - BP elevated (162/78) but he's complaining of a lot of pain in her abdomen.  - saw PCP at [facility] 11/21/20 - BMP on 09/15/20 reviewed and showed sodium 134, potassium 4.0, creatinine 1.54 and GFR 54  3: Tobacco use- - smokes 1/3 ppd of cigarettes  - complete cessation discussed for 3 minutes with him  4: Abdominal pain- - says this started a few days ago and he called 911 last night due to the pain which is also making him short of breath - will check BMP, Mg, hepatic panel today - will get abdominal xray today - sees PCP in 2 days.    Patient did not bring his medications nor a list. Each medication was verbally reviewed with the patient and he was encouraged to bring the bottles to every visit to confirm accuracy of list.   Return in 2 months or sooner depending on how the furosemide is working.

## 2021-07-04 ENCOUNTER — Ambulatory Visit: Payer: Self-pay | Admitting: Family

## 2021-07-04 ENCOUNTER — Telehealth: Payer: Self-pay | Admitting: Family

## 2021-07-04 NOTE — Telephone Encounter (Signed)
Patient did not show for his [facility] appointment on 07/04/21. Will attempt to reschedule.   ?

## 2021-07-12 ENCOUNTER — Ambulatory Visit: Payer: Self-pay | Attending: Family | Admitting: Family

## 2021-07-12 ENCOUNTER — Encounter: Payer: Self-pay | Admitting: Family

## 2021-07-12 ENCOUNTER — Other Ambulatory Visit: Payer: Self-pay

## 2021-07-12 VITALS — BP 123/71 | HR 71 | Resp 14 | Ht 74.0 in | Wt 161.0 lb

## 2021-07-12 DIAGNOSIS — Z560 Unemployment, unspecified: Secondary | ICD-10-CM | POA: Insufficient documentation

## 2021-07-12 DIAGNOSIS — Z720 Tobacco use: Secondary | ICD-10-CM

## 2021-07-12 DIAGNOSIS — I7121 Aneurysm of the ascending aorta, without rupture: Secondary | ICD-10-CM

## 2021-07-12 DIAGNOSIS — Z79899 Other long term (current) drug therapy: Secondary | ICD-10-CM | POA: Insufficient documentation

## 2021-07-12 DIAGNOSIS — F1090 Alcohol use, unspecified, uncomplicated: Secondary | ICD-10-CM | POA: Insufficient documentation

## 2021-07-12 DIAGNOSIS — Z636 Dependent relative needing care at home: Secondary | ICD-10-CM | POA: Insufficient documentation

## 2021-07-12 DIAGNOSIS — G479 Sleep disorder, unspecified: Secondary | ICD-10-CM | POA: Insufficient documentation

## 2021-07-12 DIAGNOSIS — F1490 Cocaine use, unspecified, uncomplicated: Secondary | ICD-10-CM | POA: Insufficient documentation

## 2021-07-12 DIAGNOSIS — R450 Nervousness: Secondary | ICD-10-CM | POA: Insufficient documentation

## 2021-07-12 DIAGNOSIS — F419 Anxiety disorder, unspecified: Secondary | ICD-10-CM

## 2021-07-12 DIAGNOSIS — Z8249 Family history of ischemic heart disease and other diseases of the circulatory system: Secondary | ICD-10-CM | POA: Insufficient documentation

## 2021-07-12 DIAGNOSIS — F410 Panic disorder [episodic paroxysmal anxiety] without agoraphobia: Secondary | ICD-10-CM | POA: Insufficient documentation

## 2021-07-12 DIAGNOSIS — I110 Hypertensive heart disease with heart failure: Secondary | ICD-10-CM | POA: Insufficient documentation

## 2021-07-12 DIAGNOSIS — I10 Essential (primary) hypertension: Secondary | ICD-10-CM

## 2021-07-12 DIAGNOSIS — R109 Unspecified abdominal pain: Secondary | ICD-10-CM | POA: Insufficient documentation

## 2021-07-12 DIAGNOSIS — F1290 Cannabis use, unspecified, uncomplicated: Secondary | ICD-10-CM | POA: Insufficient documentation

## 2021-07-12 DIAGNOSIS — I5032 Chronic diastolic (congestive) heart failure: Secondary | ICD-10-CM

## 2021-07-12 DIAGNOSIS — F17290 Nicotine dependence, other tobacco product, uncomplicated: Secondary | ICD-10-CM | POA: Insufficient documentation

## 2021-07-12 NOTE — Patient Instructions (Signed)
Continue weighing daily and call for an overnight weight gain of 3 pounds or more or a weekly weight gain of more than 5 pounds.   If you have voicemail, please make sure your mailbox is cleaned out so that we may leave a message and please make sure to listen to any voicemails.     

## 2021-07-12 NOTE — Progress Notes (Signed)
Lft ? ? Patient ID: Darius [name], male    DOB: 1970/06/10, 51 y.o.   MRN: [id number] ? ?HPI ? ?Darius [name] is a 51 y/o male with a history of HTN, HF, current tobacco, alcohol and drug use.  ? ?Echo report from 12/19/17 reviewed and showed an EF of 60-65% along with mild Darius and mildly elevated PA pressure of 44 mm Hg.  ? ?Was in the ED 04/22/21 due to abdominal pain and diarrhea. Stool sample collected and he was released after evaluation was negative.  ? ?He presents today for a follow-up visit with a chief complaint of minimal fatigue upon moderate exertion. He describes this as chronic in nature. He has associated cough, shortness of breath, intermittent chest pain, difficulty sleeping and anxiety along with this. He denies any dizziness, pedal edema, palpitations, abdominal distention or weight gain.  ? ?Says that he's been under considerable stress as his MIL had a stroke and hasn't done well and then his wife had a stroke. He reports that she's doing well now but feels like he has been having panic attacks. Has also recently lost his job because he was missing work while visiting his wife in the hospital.  ? ?Has questions about the aneurysm that was found.  ? ?Past Medical History:  ?Diagnosis Date  ? CHF (congestive heart failure) (HCC)   ? a.) TTE on 12/19/2017 --> LVEF 60-65%, no RWMAs, mild LA dilitation, PASP 44 mmHg  ? COPD (chronic obstructive pulmonary disease) (HCC)   ? DOE (dyspnea on exertion)   ? Glaucoma   ? History of 2019 novel coronavirus disease (COVID-19) 09/15/2020  ? Hypertension   ? Moderate concentric left ventricular hypertrophy 12/19/2017  ? Nephrolithiasis   ? Polysubstance abuse (HCC)   ? ETOH, marijuana, cocaine  ? Seizures (HCC)   ? T2DM (type 2 diabetes mellitus) (HCC)   ? a.)  Controlled with diet lifestyle modifications  ? Valvular heart disease   ? a.) TTE on 12/19/2017 --> mild MV regurgitation; moderate AV regurgitation  ? ?Past Surgical History:  ?Procedure Laterality Date  ?  CYSTOSCOPY/URETEROSCOPY/HOLMIUM LASER/STENT PLACEMENT Right 01/29/2021  ? Procedure: CYSTOSCOPY/URETEROSCOPY/HOLMIUM LASER/STENT PLACEMENT;  Surgeon: Riki Altes, MD;  Location: [facility];  Service: Urology;  Laterality: Right;  ? NO PAST SURGERIES    ? ?Family History  ?Problem Relation Age of Onset  ? Hypertension Mother   ? Kidney disease Mother   ? Diabetes Mother   ? Mitral valve prolapse Mother   ? Diabetes Father   ? Hypertension Maternal Grandmother   ? Mitral valve prolapse Maternal Grandmother   ? Other Half-Brother   ?     accident  ? ?Social History  ? ?Tobacco Use  ? Smoking status: Every Day  ?  Packs/day: 0.25  ?  Years: 35.00  ?  Pack years: 8.75  ?  Types: Cigarettes  ?  Last attempt to quit: 08/2020  ?  Years since quitting: 0.8  ? Smokeless tobacco: Never  ?Substance Use Topics  ? Alcohol use: Yes  ?  Alcohol/week: 2.0 standard drinks  ?  Types: 2 Cans of beer per week  ? ?Allergies  ?Allergen Reactions  ? Penicillins Other (See Comments)  ?  Patient states that mother is highly allergic. 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 ? ?Has patient had a PCN reaction causing immediate rash, facial/tongue/throat swelling, SOB or lightheadedness with hypotension: No ?Has patient had a PCN reaction causing severe rash involving  mucus membranes or skin necrosis: No ?Has patient had a PCN reaction that required hospitalization Yes ?Has patient had a PCN reaction occurring within the last 10 years: No ?If all of the above answers are "N  ? ?Prior to Admission medications   ?Medication Sig Start Date End Date Taking? Authorizing Provider  ?albuterol (VENTOLIN HFA) 108 (90 Base) MCG/ACT inhaler Inhale 2 puffs into the lungs every 6 (six) hours as needed for wheezing or shortness of breath. 05/29/21  Yes Iloabachie, Chioma E, NP  ?lisinopril (ZESTRIL) 20 MG tablet Take 20 mg by mouth daily.   Yes [provider]  ?aspirin EC 81 MG tablet Take 81 mg by mouth daily. Swallow  whole. ?Patient not taking: Reported on 05/29/2021    [provider]  ?HYDROcodone-acetaminophen (NORCO/VICODIN) 5-325 MG tablet Take 1 tablet by mouth every 6 (six) hours as needed for moderate pain. ?Patient not taking: Reported on 07/12/2021 01/29/21   Riki Altes, MD  ? ?Review of Systems  ?Constitutional:  Positive for fatigue. Negative for appetite change.  ?HENT:  Negative for congestion, postnasal drip and sore throat.   ?Eyes: Negative.   ?Respiratory:  Positive for cough and shortness of breath. Negative for apnea.   ?Cardiovascular:  Positive for chest pain (at times). Negative for palpitations and leg swelling.  ?Gastrointestinal:  Negative for abdominal distention and abdominal pain.  ?Endocrine: Negative.   ?Genitourinary:  Negative for difficulty urinating and dysuria.  ?Musculoskeletal:  Negative for back pain and neck pain.  ?Skin: Negative.   ?Allergic/Immunologic: Negative.   ?Neurological:  Negative for dizziness and light-headedness.  ?Hematological:  Negative for adenopathy. Does not bruise/bleed easily.  ?Psychiatric/Behavioral:  Positive for sleep disturbance (due to abdominal pain/ shortness of breath). Negative for dysphoric mood. The patient is nervous/anxious.   ? ?Vitals:  ? 07/12/21 0948  ?BP: 123/71  ?Pulse: 71  ?Resp: 14  ?SpO2: 100%  ?Weight: 161 lb (73 kg)  ?Height: 6\' 2"  (1.88 m)  ? ?Wt Readings from Last 3 Encounters:  ?07/12/21 161 lb (73 kg)  ?05/29/21 157 lb 4.8 oz (71.4 kg)  ?04/22/21 165 lb (74.8 kg)  ? ?Lab Results  ?Component Value Date  ? CREATININE 1.24 04/22/2021  ? CREATININE 1.10 01/29/2021  ? CREATININE 1.32 (H) 12/25/2020  ? ?Physical Exam ?Vitals and nursing note reviewed.  ?Constitutional:   ?   Appearance: Normal appearance.  ?HENT:  ?   Head: Normocephalic and atraumatic.  ?Cardiovascular:  ?   Rate and Rhythm: Normal rate and regular rhythm.  ?Pulmonary:  ?   Effort: Pulmonary effort is normal. No respiratory distress.  ?   Breath sounds: Rales (few  right lower lobe) present. No wheezing.  ?Abdominal:  ?   General: Abdomen is flat. There is no distension.  ?   Palpations: Abdomen is soft.  ?   Tenderness: There is no abdominal tenderness.  ?Musculoskeletal:     ?   General: No tenderness.  ?   Cervical back: Normal range of motion and neck supple.  ?   Right lower leg: No edema.  ?   Left lower leg: No edema.  ?Skin: ?   General: Skin is warm and dry.  ?Neurological:  ?   General: No focal deficit present.  ?   Mental Status: He is alert and oriented to person, place, and time.  ?Psychiatric:     ?   Mood and Affect: Mood is anxious.     ?   Behavior: Behavior  normal.     ?   Thought Content: Thought content normal.  ? ?Assessment & Plan: ? ?1: Chronic heart failure with preserved ejection fraction without structural changes- ?- NYHA class II ?- euvolemic today ?- weighing daily & he was reminded to call for an overnight weight gain of >2 pounds or a weekly weight gain of >5 pounds ?- weight up 4 pounds from last visit here 7 months ago  ?- not adding salt to his food & reminded to closely follow a low sodium diet ?- BNP 12/18/17 was 842.0 ? ?2: HTN- ?- BP looks good (123/71) ?- saw PCP at [facility] 05/29/21; returns 08/27/21 ?- BMP on 04/22/21 reviewed and showed sodium 141, potassium 4.1, creatinine 1.24 and GFR >60 ?- says that he's taking lisinopril but hasn't taken furosemide in "a long time" ? ?3: Tobacco use- ?- hasn't smoked any tobacco but is vaping ?- reviewed the unknown dangers of vaping ?- complete cessation discussed for 3 minutes with him ? ?4: Aneurysm of ascending aorta without rupture- ?- sees cardiothoracic 07/24/21 ?- chest CT on 04/22/21 showed 4.0 cm ascending thoracic aorta ?- reviewed what an aneurysm was  ? ?5: Anxiety- ?- patient admits to feeling anxious and having panic attacks which he knows makes his shortness of breath worse ?- encouraged him to discuss this with his PCP ? ? ?Patient did not bring his medications nor a list. Each  medication was verbally reviewed with the patient and he was encouraged to bring the bottles to every visit to confirm accuracy of list.  ? ? ? ? ? ? ? ? ? ? ? ? ? ? ?  ? ?

## 2021-08-02 NOTE — Progress Notes (Signed)
? ?   ?[address] ?      [place] [address] ?            [phone]   ? ?   ? ?PCP is Iloabachie, Chioma E, NP ?Referring Provider is Rolm Gala, NP ? ?CC: 4.0 cm ascending thoracic aortic aneurysm ? ? ?HPI: ?This is a 51 year old male with a past medical history of CHF, COPD, diabetes, mellitus type 2, hypertension, glaucoma, polysubstance abuse, seizures, and mild MR and moderate AI and bicuspid aortic valve (by echo 2019) . He was seen in the ED in December 2022 because he felt something moving around under his skin and chest. He thought he might have tape worms from undercooked pork. He had a CT of the chest done 04/22/2021 that showed no chest wall abnormality or mass but a mildly dilated ascending thoracic aorta 4.0 cm. Patient has shortness of breath at times, but mostly of late, he feels very anxious. He has been unemployed for the past month, and it is "weighing heavily on him". He also states his legs "feel heavy" sometimes when walking so he rests when this happens. He denies chest pain, tightness, or pressure. ? ?Past Medical History:  ?Diagnosis Date  ? CHF (congestive heart failure) (HCC)   ? a.) TTE on 12/19/2017 --> LVEF 60-65%, no RWMAs, mild LA dilitation, PASP 44 mmHg  ? COPD (chronic obstructive pulmonary disease) (HCC)   ? DOE (dyspnea on exertion)   ? Glaucoma   ? History of 2019 novel coronavirus disease (COVID-19) 09/15/2020  ? Hypertension   ? Moderate concentric left ventricular hypertrophy 12/19/2017  ? Nephrolithiasis   ? Polysubstance abuse (HCC)   ? ETOH, marijuana, cocaine  ? Seizures (HCC)   ? T2DM (type 2 diabetes mellitus) (HCC)   ? a.)  Controlled with diet lifestyle modifications  ? Valvular heart disease   ? a.) TTE on 12/19/2017 --> mild MV regurgitation; moderate AV regurgitation  ? ? ?Past Surgical History:  ?Procedure Laterality Date  ? CYSTOSCOPY/URETEROSCOPY/HOLMIUM LASER/STENT PLACEMENT Right 01/29/2021  ? Procedure:  CYSTOSCOPY/URETEROSCOPY/HOLMIUM LASER/STENT PLACEMENT;  Surgeon: Riki Altes, MD;  Location: [facility] ORS;  Service: Urology;  Laterality: Right;  ? NO PAST SURGERIES    ? ? ?Family History  ?Problem Relation Age of Onset  ? Hypertension Mother   ? Kidney disease Mother   ? Diabetes Mother   ? Mitral valve prolapse Mother   ? Diabetes Father   ? Hypertension Maternal Grandmother   ? Mitral valve prolapse Maternal Grandmother   ? Other Half-Brother   ?     accident  ?Mother is alive at age 1 ?Father is deceased-had diabetes and multiple strokes ? ?Social History ?Social History  ? ?Tobacco Use  ? Smoking status: Former  ?  Packs/day: 0.25  ?  Years: 35.00  ?  Pack years: 8.75  ?  Types: Cigarettes  ?  Quit date: 08/2020  ?  Years since quitting: 0.9  ? Smokeless tobacco: Never  ? Tobacco comments:  ?  Quit smoking cigarettes 1/23' but still using vape/e-cig  ?Vaping Use  ? Vaping Use: Former  ? Quit date: 05/12/2021  ? Substances: Nicotine, Flavoring  ?Substance Use Topics  ? Alcohol use: Yes  ?  Alcohol/week: 2.0 standard drinks  ?  Types: 2 Cans of beer per week  ? Drug use: Yes  ?  Types: Marijuana, Other-see comments  ?  Comment: last use of crack cocaine 01/2021 current  use of marijuana  ? ? ?Current Outpatient Medications  ?Medication Sig Dispense Refill  ? albuterol (VENTOLIN HFA) 108 (90 Base) MCG/ACT inhaler Inhale 2 puffs into the lungs every 6 (six) hours as needed for wheezing or shortness of breath. 8.5 g 3  ? aspirin EC 81 MG tablet Take 81 mg by mouth daily. Swallow whole. (Patient not taking: Reported on 05/29/2021)    ? HYDROcodone-acetaminophen (NORCO/VICODIN) 5-325 MG tablet Take 1 tablet by mouth every 6 (six) hours as needed for moderate pain. (Patient not taking: Reported on 07/12/2021) 12 tablet 0  ? lisinopril (ZESTRIL) 20 MG tablet Take 20 mg by mouth daily.    ? ? ?Allergies  ?Allergen Reactions  ? Penicillins Other (See Comments)  ?  Patient states that mother is highly allergic. Was given to  him at birth and caused grand mal seizures. ? ?Has patient had a PCN reaction causing immediate rash, facial/tongue/throat swelling, SOB or lightheadedness with hypotension: No ?Has patient had a PCN reaction causing severe rash involving mucus membranes or skin necrosis: No ?Has patient had a PCN reaction that required hospitalization Yes ?Has patient had a PCN reaction occurring within the last 10 years: No ?If all of the above answers are "N  ?Vital Signs: ?Vitals:  ? 08/05/21 1353  ?BP: 112/62  ?Pulse: 72  ?Resp: 20  ?SpO2: 98%  ?  ? ?Physical Exam: ?CV-RRR, murmur heard best along left sternal border and radiates to carotids ?Pulmonary-Clear to auscultation bilaterally ?Abdomen-Soft, non tender, bowel sounds present ?Extremities-No LE edema ?Neurologic-Grossly intact without focal deficit ? ?Review of Systems ?Cardiac Review of Systems: Y or  [   N ]= no ?            Chest Pain [ N  ]        Resting SOB [  N ]      Pedal Edema [  N ]     Syncope  [ N ]            Presyncope [  N ] ?            General Review of Systems: [Y] = yes [ N ]=no ?Constitional: ; nausea [ N ]; night sweats [ N ]; fever [ N ];                                                          ?  ?            Eye : blurred vision [ sometimes ]; diplopia [ N  ]; Amaurosis fugax[ N ]; ?Resp: cough N[  ];  wheezing[ N ];  hemoptysis[N  ];  ?GI:  vomiting[ N ] melena[N  ];  hematochezia [  N];  ?GU:  hematuria[N  ];  ?            Skin: rash, swelling[ N ];, ?            Heme/Lymph: anemia[ N ];  ?Neuro: TIA[ N ];  stroke[ N ];  seizures[ N ];    ?            Endocrine: diabetes[Y  ];  ?         ?Diagnostic Tests: ?  ?EXAM: ?CT CHEST WITH CONTRAST ?  ?TECHNIQUE: ?Multidetector CT  imaging of the chest was performed during ?intravenous contrast administration. ?  ?CONTRAST:  83mL OMNIPAQUE IOHEXOL 300 MG/ML  SOLN ?  ?COMPARISON:  Same date chest x-ray, CT 08/24/2020 ?  ?FINDINGS: ?Cardiovascular: Heart size is upper limits of normal. No  pericardial ?effusion. Main pulmonary trunk is mildly dilated at 3.5 cm in ?diameter. Ascending thoracic aorta measures 4.0 cm in diameter ?(series 5, image 52). Common origin of the brachiocephalic and left ?common carotid arteries, an anatomic variant. Mild atherosclerotic ?calcifications of the aorta and coronary arteries. ?  ?Mediastinum/Nodes: No enlarged mediastinal, hilar, or axillary lymph ?nodes. Thyroid gland, trachea, and esophagus demonstrate no ?significant findings. ?  ?Lungs/Pleura: Lungs are clear. No pleural effusion or pneumothorax. ?  ?Upper Abdomen: No acute abnormality. ?  ?Musculoskeletal: No chest wall abnormality. No acute or significant ?osseous findings. ?  ?IMPRESSION: ?1. No abnormality or mass of the chest wall. ?2. Lungs are clear. ?3. Mildly dilated ascending thoracic aorta measuring up to 4.0 cm in ?diameter. Recommend annual imaging followup by CTA or MRA. This ?recommendation follows 2010 ?ACCF/AHA/AATS/ACR/ASA/SCA/SCAI/SIR/STS/SVM Guidelines for the ?Diagnosis and Management of Patients with Thoracic Aortic Disease. ?Circulation. 2010; 121: B762-G315. Aortic aneurysm NOS (ICD10-I71.9) ?4. Mildly dilated main pulmonary trunk, which can be seen with ?pulmonary arterial hypertension. ?5. Aortic and coronary artery atherosclerosis (ICD10-I70.0). ?  ?  ?Electronically Signed ?  By: Duanne Guess D.O. ?  On: 04/22/2021 15:10 ?  ?Impression and Plan: ?We discussed the importance of good blood pressure control (130/80 or less). He is has not been on Lisinopril since purposely losing 100 pounds sometime ago. We discussed the importance of avoidance of fluoroquinolone (I.e Cipro, Avelox, etc) antibiotic as may increase risk of rupture of ATAA. He quit smoking over a little less than one year ago but is vaping. I instructed him on the importance of vaping cessation. He is not on a statin and states he does not have high cholesterol.  He was instructed to continue to work out and be active,  with avoidance of contact sports and no heavy lifting. He will return with CT of chest in 6 months for further surveillance of ATAA. ? ?Ardelle Balls, PA-C ?[facility] ?[phone] ? ?

## 2021-08-05 ENCOUNTER — Encounter: Payer: Self-pay | Admitting: Physician Assistant

## 2021-08-05 ENCOUNTER — Institutional Professional Consult (permissible substitution) (INDEPENDENT_AMBULATORY_CARE_PROVIDER_SITE_OTHER): Payer: Self-pay | Admitting: Physician Assistant

## 2021-08-05 VITALS — BP 112/62 | HR 72 | Resp 20 | Ht 74.0 in | Wt 156.6 lb

## 2021-08-05 DIAGNOSIS — I77810 Thoracic aortic ectasia: Secondary | ICD-10-CM

## 2021-08-05 NOTE — Patient Instructions (Addendum)
Risk Modification in those with ascending thoracic aortic aneurysm: ? ?Continue good control of blood pressure (prefer SBP 130/80 or less) ? ?2. Avoid fluoroquinolone antibiotics (I.e Ciprofloxacin, Avelox, Levofloxacin, Ofloxacin) ? ?3.  Use of statin (to decrease cardiovascular risk) ? ?4.  Exercise and activity limitations is individualized, but in general, contact sports are to be avoided and one should avoid heavy lifting (defined as half of ideal body weight) and ?exercises involving sustained Valsalva maneuver. ? ?5. Counseling for those suspected of having genetically mediated disease. First-degree ?relatives of those with TAA disease should be screened as well as those who ?have a connective tissue disease (I.e with Marfan syndrome, Ehlers-Danlos syndrome,  ?and Loeys-Dietz syndrome) or a  bicuspid aortic valve,have an increased risk for  ?complications related to TAA ? ?6. If one has tobacco abuse, smoking cessation is highly encouraged. He quit smoking last Spring but is vaping. He was encouraged to quit vaping. ?

## 2021-08-22 ENCOUNTER — Telehealth: Payer: Self-pay | Admitting: Gerontology

## 2021-08-22 NOTE — Telephone Encounter (Signed)
Left message asking if pt would be ok with a phone visit on 5/2 as we are unable to assist with transportation.  ?

## 2021-08-27 ENCOUNTER — Other Ambulatory Visit: Payer: Self-pay

## 2021-08-27 ENCOUNTER — Other Ambulatory Visit: Payer: Self-pay | Admitting: Gerontology

## 2021-08-27 ENCOUNTER — Ambulatory Visit: Payer: Self-pay | Admitting: Gerontology

## 2021-08-27 DIAGNOSIS — K219 Gastro-esophageal reflux disease without esophagitis: Secondary | ICD-10-CM | POA: Insufficient documentation

## 2021-08-27 DIAGNOSIS — R0602 Shortness of breath: Secondary | ICD-10-CM

## 2021-08-27 DIAGNOSIS — R0981 Nasal congestion: Secondary | ICD-10-CM | POA: Insufficient documentation

## 2021-08-27 MED ORDER — PANTOPRAZOLE SODIUM 40 MG PO TBEC: 40.0000 mg | DELAYED_RELEASE_TABLET | Freq: Every day | ORAL | 0 refills | Status: DC

## 2021-08-27 MED ORDER — ALBUTEROL SULFATE HFA 108 (90 BASE) MCG/ACT IN AERS
2.0000 | INHALATION_SPRAY | Freq: Four times a day (QID) | RESPIRATORY_TRACT | 3 refills | Status: DC | PRN
  Filled 2021-08-27 – 2021-10-04 (×2): qty 6.7, 25d supply, fill #0

## 2021-08-27 MED ORDER — OMEPRAZOLE 20 MG PO CPDR: 20.0000 mg | DELAYED_RELEASE_CAPSULE | Freq: Every day | ORAL | 0 refills | Status: DC

## 2021-08-27 MED ORDER — FLUTICASONE PROPIONATE 50 MCG/ACT NA SUSP
1.0000 | Freq: Every day | NASAL | 0 refills | Status: DC
  Filled 2021-08-27: qty 16, 60d supply, fill #0

## 2021-08-27 MED ORDER — OMEPRAZOLE 20 MG PO CPDR
20.0000 mg | DELAYED_RELEASE_CAPSULE | Freq: Every day | ORAL | 0 refills | Status: DC
  Filled 2021-08-27: qty 30, 30d supply, fill #0

## 2021-08-27 NOTE — Progress Notes (Signed)
? ?  Established Patient Office Visit ? ?Subjective   ?Patient ID: Darius [name], male    DOB: [birth year]  Age: 51 y.o. MRN: [id number] ? ?No chief complaint on file. ? ?Patient consents to telephone visit, 2 identifiers was used to identify patient. ? ?HPI ? ?Darius [name]   is a 51 year old male with a PMH of CHF, Diabetes, Glaucoma and  HTN,presents for routine follow up visit and medication refill. Currently, he reports experiencing acid reflux that started few weeks ago. He states that eating  spicy food  and eating late at night exacerbates symptoms. He reports that shortness of breath is associated with acid reflux.He checked his blood pressure at [facility]  and it was 132/62. He was seen at the [facility] by [facility], he states that he's compliant with his medications. He was also seen by Cardiothoracic Specialist on 08/05/21 by Dr Joycelyn Man D.M for 4.0 cm ascending thoracic aortic aneurysm, and was advised to avoid Ciprofloxacin and Avelox antibiotics and will follow up in 6 months for surveillance. He also c/o nasal congestion that associated with seasonal changes. Overall, he states that he's doing well and offers no further complaint. ? ?Review of Systems  ?Constitutional: Negative.   ?HENT:    ?     Nasal congestion  ?Respiratory: Negative.    ?Cardiovascular: Negative.   ?Gastrointestinal:  Positive for heartburn.  ?Neurological: Negative.   ?Psychiatric/Behavioral: Negative.    ? ?  ?Objective:  ?   ? ?There were no vitals taken for this visit. ? ? ?Physical Exam ?Telephone visit ? ?No results found for any visits on 08/27/21. ? ? ? ?The 10-year ASCVD risk score (Arnett DK, et al., 2019) is: 8.1% ? ?  ?Assessment & Plan:  ? ?1. Gastroesophageal reflux disease, unspecified whether esophagitis present ?- He was started on Omeprazole , was educated on medication side effects and advised to notify clinic. ?-Avoid spicy, fatty and fried food ?-Avoid sodas and sour juices ?-Avoid heavy  meals ?-Avoid eating 4 hours before bedtime ?-Elevate head of bed at night ?- omeprazole (PRILOSEC) 20 MG capsule; Take 1 capsule (20 mg total) by mouth daily.  Dispense: 30 capsule; Refill: 0 ? ?2. Shortness of breath ?- He was advised to use Albuterol as needed, manage acid reflux and go to the ED for worsening symptoms. ?- albuterol (VENTOLIN HFA) 108 (90 Base) MCG/ACT inhaler; Inhale 2 puffs into the lungs every 6 (six) hours as needed for wheezing or shortness of breath.  Dispense: 6.7 g; Refill: 3 ? ?3. Nasal congestion ?- He was started on Flonase for nasal congestion, was educated on medication side effects and advised to notify clinic for worsening symptoms. ?- fluticasone (FLONASE) 50 MCG/ACT nasal spray; Place 1 spray into both nostrils daily.  Dispense: 16 g; Refill: 0 ? ? ?Return in about 30 days (around 09/26/2021), or if symptoms worsen or fail to improve.  ? ? ?Nisha Dhami Trellis Paganini, NP ? ?

## 2021-08-27 NOTE — Congregational Nurse Program (Signed)
?  Dept: [phone] ? ? ? For Congregational Nurse Program Note ? ?Date of Encounter: 08/27/2021 ?Client to clinic today with complaints of a runny nose and congestion. Aromatherapy used. ?RN Assisted client with contacting the [facility] for a telephone visit. During this telephone visit he was prescribed medication for his GERD and Flonase. Client agreeable to using [facility] for his pharmacy. ?Client also reported that he is not taking Lisinopril and is only taking 10 mg of lasix a day instead of the prescribed 20 mg. NP at [facility] made aware. ?RN to assist client with [facility] Client appreciative of assistance provided. ?Past Medical History: ?Past Medical History:  ?Diagnosis Date  ? CHF (congestive heart failure) (HCC)   ? a.) TTE on 12/19/2017 --> LVEF 60-65%, no RWMAs, mild LA dilitation, PASP 44 mmHg  ? COPD (chronic obstructive pulmonary disease) (HCC)   ? DOE (dyspnea on exertion)   ? Glaucoma   ? History of 2019 novel coronavirus disease (COVID-19) 09/15/2020  ? Hypertension   ? Moderate concentric left ventricular hypertrophy 12/19/2017  ? Nephrolithiasis   ? Polysubstance abuse (HCC)   ? ETOH, marijuana, cocaine  ? Seizures (HCC)   ? T2DM (type 2 diabetes mellitus) (HCC)   ? a.)  Controlled with diet lifestyle modifications  ? Valvular heart disease   ? a.) TTE on 12/19/2017 --> mild MV regurgitation; moderate AV regurgitation  ? ? ?Encounter Details: ? CNP Questionnaire - 08/27/21 1236   ? ?  ? Questionnaire  ? Do you give verbal consent to treat you today? Yes   ? Location Patient Served  [place]   ? Visit Setting Church or Organization   ? Patient Status Unknown   client lives in a boarding house  ? Insurance Uninsured ([employer]/Self-Pay)   ? Insurance Referral N/A   ? Medication N/A   client to get his medications at [facility]  ? Medical Provider Yes   [facility]  ? Screening Referrals N/A   ? Medical Referral N/A   ?  Medical Appointment Made N/A   assisted client with telephone visit to [facility] today  ? Food N/A   ? Transportation Need transportation assistance   uses the [employer] bus, needs intermittent transportation assistance, wife helps  ? Housing/Utilities N/A   lives in a boardoing house  ? Interpersonal Safety N/A   ? Intervention Blood pressure;Case Management;Educate;Support   ? ED Visit Averted N/A   ? Life-Saving Intervention Made N/A   ? ?  ?  ? ?  ? ? ? ? ?

## 2021-08-27 NOTE — Patient Instructions (Signed)
Food Choices for Gastroesophageal Reflux Disease, Adult When you have gastroesophageal reflux disease (GERD), the foods you eat and your eating habits are very important. Choosing the right foods can help ease the discomfort of GERD. Consider working with a dietitian to help you make healthy food choices. What are tips for following this plan? Reading food labels Look for foods that are low in saturated fat. Foods that have less than 5% of daily value (DV) of fat and 0 g of trans fats may help with your symptoms. Cooking Cook foods using methods other than frying. This may include baking, steaming, grilling, or broiling. These are all methods that do not need a lot of fat for cooking. To add flavor, try to use herbs that are low in spice and acidity. Meal planning  Choose healthy foods that are low in fat, such as fruits, vegetables, whole grains, low-fat dairy products, lean meats, fish, and poultry. Eat frequent, small meals instead of three large meals each day. Eat your meals slowly, in a relaxed setting. Avoid bending over or lying down until 2-3 hours after eating. Limit high-fat foods such as fatty meats or fried foods. Limit your intake of fatty foods, such as oils, butter, and shortening. Avoid the following as told by your health care provider: Foods that cause symptoms. These may be different for different people. Keep a food diary to keep track of foods that cause symptoms. Alcohol. Drinking large amounts of liquid with meals. Eating meals during the 2-3 hours before bed. Lifestyle Maintain a healthy weight. Ask your health care provider what weight is healthy for you. If you need to lose weight, work with your health care provider to do so safely. Exercise for at least 30 minutes on 5 or more days each week, or as told by your health care provider. Avoid wearing clothes that fit tightly around your waist and chest. Do not use any products that contain nicotine or tobacco. These  products include cigarettes, chewing tobacco, and vaping devices, such as e-cigarettes. If you need help quitting, ask your health care provider. Sleep with the head of your bed raised. Use a wedge under the mattress or blocks under the bed frame to raise the head of the bed. Chew sugar-free gum after mealtimes. What foods should I eat?  Eat a healthy, well-balanced diet of fruits, vegetables, whole grains, low-fat dairy products, lean meats, fish, and poultry. Each person is different. Foods that may trigger symptoms in one person may not trigger any symptoms in another person. Work with your health care provider to identify foods that are safe for you. The items listed above may not be a complete list of recommended foods and beverages. Contact a dietitian for more information. What foods should I avoid? Limiting some of these foods may help manage the symptoms of GERD. Everyone is different. Consult a dietitian or your health care provider to help you identify the exact foods to avoid, if any. Fruits Any fruits prepared with added fat. Any fruits that cause symptoms. For some people this may include citrus fruits, such as oranges, grapefruit, pineapple, and lemons. Vegetables Deep-fried vegetables. French fries. Any vegetables prepared with added fat. Any vegetables that cause symptoms. For some people, this may include tomatoes and tomato products, chili peppers, onions and garlic, and horseradish. Grains Pastries or quick breads with added fat. Meats and other proteins High-fat meats, such as fatty beef or pork, hot dogs, ribs, ham, sausage, salami, and bacon. Fried meat or protein, including   fried fish and fried chicken. Nuts and nut butters, in large amounts. Dairy Whole milk and chocolate milk. Sour cream. Cream. Ice cream. Cream cheese. Milkshakes. Fats and oils Butter. Margarine. Shortening. Ghee. Beverages Coffee and tea, with or without caffeine. Carbonated beverages. Sodas. Energy  drinks. Fruit juice made with acidic fruits, such as orange or grapefruit. Tomato juice. Alcoholic drinks. Sweets and desserts Chocolate and cocoa. Donuts. Seasonings and condiments Pepper. Peppermint and spearmint. Added salt. Any condiments, herbs, or seasonings that cause symptoms. For some people, this may include curry, hot sauce, or vinegar-based salad dressings. The items listed above may not be a complete list of foods and beverages to avoid. Contact a dietitian for more information. Questions to ask your health care provider Diet and lifestyle changes are usually the first steps that are taken to manage symptoms of GERD. If diet and lifestyle changes do not improve your symptoms, talk with your health care provider about taking medicines. Where to find more information International Foundation for Gastrointestinal Disorders: aboutgerd.org Summary When you have gastroesophageal reflux disease (GERD), food and lifestyle choices may be very helpful in easing the discomfort of GERD. Eat frequent, small meals instead of three large meals each day. Eat your meals slowly, in a relaxed setting. Avoid bending over or lying down until 2-3 hours after eating. Limit high-fat foods such as fatty meats or fried foods. This information is not intended to replace advice given to you by your health care provider. Make sure you discuss any questions you have with your health care provider. Document Revised: 10/24/2019 Document Reviewed: 10/24/2019 Elsevier Patient Education  2023 [employer].  

## 2021-09-02 ENCOUNTER — Other Ambulatory Visit: Payer: Self-pay

## 2021-09-06 ENCOUNTER — Encounter: Payer: Self-pay | Admitting: Family

## 2021-09-06 ENCOUNTER — Ambulatory Visit: Payer: Medicaid Other | Attending: Family | Admitting: Family

## 2021-09-06 VITALS — BP 148/86 | HR 80 | Resp 18 | Ht 74.0 in | Wt 155.0 lb

## 2021-09-06 DIAGNOSIS — I10 Essential (primary) hypertension: Secondary | ICD-10-CM | POA: Diagnosis not present

## 2021-09-06 DIAGNOSIS — K219 Gastro-esophageal reflux disease without esophagitis: Secondary | ICD-10-CM | POA: Insufficient documentation

## 2021-09-06 DIAGNOSIS — F410 Panic disorder [episodic paroxysmal anxiety] without agoraphobia: Secondary | ICD-10-CM | POA: Insufficient documentation

## 2021-09-06 DIAGNOSIS — F419 Anxiety disorder, unspecified: Secondary | ICD-10-CM

## 2021-09-06 DIAGNOSIS — I7121 Aneurysm of the ascending aorta, without rupture: Secondary | ICD-10-CM | POA: Diagnosis not present

## 2021-09-06 DIAGNOSIS — Z720 Tobacco use: Secondary | ICD-10-CM

## 2021-09-06 DIAGNOSIS — I5032 Chronic diastolic (congestive) heart failure: Secondary | ICD-10-CM | POA: Insufficient documentation

## 2021-09-06 DIAGNOSIS — Z87891 Personal history of nicotine dependence: Secondary | ICD-10-CM | POA: Insufficient documentation

## 2021-09-06 DIAGNOSIS — Z8249 Family history of ischemic heart disease and other diseases of the circulatory system: Secondary | ICD-10-CM | POA: Insufficient documentation

## 2021-09-06 DIAGNOSIS — Z8616 Personal history of COVID-19: Secondary | ICD-10-CM | POA: Insufficient documentation

## 2021-09-06 DIAGNOSIS — I110 Hypertensive heart disease with heart failure: Secondary | ICD-10-CM | POA: Insufficient documentation

## 2021-09-06 NOTE — Patient Instructions (Addendum)
Continue weighing daily and call for an overnight weight gain of 3 pounds or more or a weekly weight gain of more than 5 pounds. ? ? ?If you have voicemail, please make sure your mailbox is cleaned out so that we may leave a message and please make sure to listen to any voicemails.  ? ? ?Call [facility] to ask about heartburn medication ? ? ?Resume lisinopril 2.5mg  as 1 tablet every day ?

## 2021-09-06 NOTE — Progress Notes (Signed)
Lft ? ? Patient ID: Darius [name], male    DOB: 1970/09/07, 51 y.o.   MRN: [id number] ? ?HPI ? ?Darius [name] is a 51 y/o male with a history of HTN, HF, current tobacco, alcohol and drug use.  ? ?Echo report from 12/19/17 reviewed and showed an EF of 60-65% along with mild Darius and mildly elevated PA pressure of 44 mm Hg.  ? ?Was in the ED 04/22/21 due to abdominal pain and diarrhea. Stool sample collected and he was released after evaluation was negative.  ? ?He presents today for a follow-up visit with a chief complaint of minimal fatigue upon moderate exertion. Describes this as chronic in nature. He has associated decreased appetite, shortness of breath, reflux (worsening) intermittent chest pain, anxiety and difficulty sleeping along with this. He denies any dizziness, cough, pedal edema, palpitations, abdominal distention or weight gain.  ? ?Says that's really afraid to eat anything because he doesn't know what is going to trigger worsening of his acid reflux. Says the omeprazole helps but doesn't last long enough. The reflux causes burning/pain in his chest which then makes him feel anxious.  ? ?Past Medical History:  ?Diagnosis Date  ? CHF (congestive heart failure) (HCC)   ? a.) TTE on 12/19/2017 --> LVEF 60-65%, no RWMAs, mild LA dilitation, PASP 44 mmHg  ? COPD (chronic obstructive pulmonary disease) (HCC)   ? DOE (dyspnea on exertion)   ? Glaucoma   ? History of 2019 novel coronavirus disease (COVID-19) 09/15/2020  ? Hypertension   ? Moderate concentric left ventricular hypertrophy 12/19/2017  ? Nephrolithiasis   ? Polysubstance abuse (HCC)   ? ETOH, marijuana, cocaine  ? Seizures (HCC)   ? T2DM (type 2 diabetes mellitus) (HCC)   ? a.)  Controlled with diet lifestyle modifications  ? Valvular heart disease   ? a.) TTE on 12/19/2017 --> mild MV regurgitation; moderate AV regurgitation  ? ?Past Surgical History:  ?Procedure Laterality Date  ? CYSTOSCOPY/URETEROSCOPY/HOLMIUM LASER/STENT PLACEMENT Right 01/29/2021  ?  Procedure: CYSTOSCOPY/URETEROSCOPY/HOLMIUM LASER/STENT PLACEMENT;  Surgeon: Riki Altes, MD;  Location: [facility] ORS;  Service: Urology;  Laterality: Right;  ? NO PAST SURGERIES    ? ?Family History  ?Problem Relation Age of Onset  ? Hypertension Mother   ? Kidney disease Mother   ? Diabetes Mother   ? Mitral valve prolapse Mother   ? Diabetes Father   ? Hypertension Maternal Grandmother   ? Mitral valve prolapse Maternal Grandmother   ? Other Half-Brother   ?     accident  ? ?Social History  ? ?Tobacco Use  ? Smoking status: Former  ?  Packs/day: 0.25  ?  Years: 35.00  ?  Pack years: 8.75  ?  Types: Cigarettes  ?  Quit date: 08/2020  ?  Years since quitting: 1.0  ? Smokeless tobacco: Never  ? Tobacco comments:  ?  Quit smoking cigarettes 1/23' but still using vape/e-cig  ?Substance Use Topics  ? Alcohol use: Yes  ?  Alcohol/week: 2.0 standard drinks  ?  Types: 2 Cans of beer per week  ? ?Allergies  ?Allergen Reactions  ? Avelox [Moxifloxacin] Other (See Comments)  ?  Thoracic Aneurysm.  ? Ciprofloxacin Other (See Comments)  ?  Has thoracic Aneurysm  ? Penicillins Other (See Comments)  ?  Patient states that mother is highly allergic. 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 ? ?Has patient had a PCN reaction causing immediate rash, facial/tongue/throat swelling, SOB or  lightheadedness with hypotension: No ?Has patient had a PCN reaction causing severe rash involving mucus membranes or skin necrosis: No ?Has patient had a PCN reaction that required hospitalization Yes ?Has patient had a PCN reaction occurring within the last 10 years: No ?If all of the above answers are "N  ? ?Prior to Admission medications   ?Medication Sig Start Date End Date Taking? Authorizing Provider  ?albuterol (PROVENTIL HFA) 108 (90 Base) MCG/ACT inhaler Inhale 2 puffs into the lungs once every 6 (six) hours as needed for wheezing or shortness of breath. 08/27/21  Yes Iloabachie, Chioma E, NP  ?aspirin EC 81 MG tablet Take 81  mg by mouth daily. Swallow whole.   Yes [provider]  ?furosemide (LASIX) 20 MG tablet Take 20 mg by mouth.   Yes [provider]  ?HYDROcodone-acetaminophen (NORCO/VICODIN) 5-325 MG tablet Take 1 tablet by mouth every 6 (six) hours as needed for moderate pain. 01/29/21  Yes Stoioff, Verna Czech, MD  ?omeprazole (PRILOSEC) 20 MG capsule Take 1 capsule (20 mg total) by mouth once daily. 08/27/21  Yes Iloabachie, Chioma E, NP  ?fluticasone (FLONASE) 50 MCG/ACT nasal spray Place 1 spray into both nostrils once daily. ?Patient not taking: Reported on 09/06/2021 08/27/21   Eulogio Bear E, NP  ? ? ?Review of Systems  ?Constitutional:  Positive for appetite change (decreased due to reflux) and fatigue.  ?HENT:  Negative for congestion, postnasal drip and sore throat.   ?Eyes: Negative.   ?Respiratory:  Positive for shortness of breath. Negative for apnea and cough.   ?Cardiovascular:  Positive for chest pain (at times). Negative for palpitations and leg swelling.  ?Gastrointestinal:  Negative for abdominal distention and abdominal pain.  ?     + reflux/ burning sensation   ?Endocrine: Negative.   ?Genitourinary:  Negative for difficulty urinating and dysuria.  ?Musculoskeletal:  Negative for back pain and neck pain.  ?Skin: Negative.   ?Allergic/Immunologic: Negative.   ?Neurological:  Negative for dizziness and light-headedness.  ?Hematological:  Negative for adenopathy. Does not bruise/bleed easily.  ?Psychiatric/Behavioral:  Positive for sleep disturbance (due to reflux). Negative for dysphoric mood. The patient is nervous/anxious.   ? ?Vitals:  ? 09/06/21 1022  ?BP: (!) 148/86  ?Pulse: 80  ?Resp: 18  ?SpO2: 100%  ?Weight: 155 lb (70.3 kg)  ?Height: 6\' 2"  (1.88 m)  ? ?Wt Readings from Last 3 Encounters:  ?09/06/21 155 lb (70.3 kg)  ?08/05/21 156 lb 9.6 oz (71 kg)  ?07/12/21 161 lb (73 kg)  ? ?Lab Results  ?Component Value Date  ? CREATININE 1.24 04/22/2021  ? CREATININE 1.10 01/29/2021  ? CREATININE  1.32 (H) 12/25/2020  ? ?Physical Exam ?Vitals and nursing note reviewed.  ?Constitutional:   ?   Appearance: Normal appearance. He is well-developed.  ?HENT:  ?   Head: Normocephalic and atraumatic.  ?Cardiovascular:  ?   Rate and Rhythm: Normal rate and regular rhythm.  ?Pulmonary:  ?   Effort: Pulmonary effort is normal. No respiratory distress.  ?   Breath sounds: No wheezing, rhonchi or rales.  ?Abdominal:  ?   General: Abdomen is flat. There is no distension.  ?   Palpations: Abdomen is soft.  ?   Tenderness: There is no abdominal tenderness.  ?Musculoskeletal:     ?   General: No tenderness.  ?   Cervical back: Normal range of motion and neck supple.  ?   Right lower leg: No edema.  ?   Left lower  leg: No edema.  ?Skin: ?   General: Skin is warm and dry.  ?Neurological:  ?   General: No focal deficit present.  ?   Mental Status: He is alert and oriented to person, place, and time.  ?Psychiatric:     ?   Mood and Affect: Mood is anxious.     ?   Behavior: Behavior normal.     ?   Thought Content: Thought content normal.  ? ?Assessment & Plan: ? ?1: Chronic heart failure with preserved ejection fraction without structural changes- ?- NYHA class II ?- euvolemic today ?- weighing daily & he was reminded to call for an overnight weight gain of >2 pounds or a weekly weight gain of >5 pounds ?- weight down 6 pounds from last visit here 2 months ago  ?- not adding salt to his food & reminded to closely follow a low sodium diet ?- BNP 12/18/17 was 842.0 ? ?2: HTN- ?- BP elevated (148/86) ?- saw PCP at [facility] 08/27/21 ?- BMP on 04/22/21 reviewed and showed sodium 141, potassium 4.1, creatinine 1.24 and GFR >60 ?- will resume lisinopril 2.5mg  daily; he says that he has plenty of this at home ?- check BMP next visit ? ?3: Tobacco use- ?- no longer smoking/ vaping ?- continued cessation discussed ? ?4: Aneurysm of ascending aorta without rupture- ?- saw cardiothoracic 08/05/21; returns in 6 months ?- chest CT on  04/22/21 showed 4.0 cm ascending thoracic aorta ? ?5: Anxiety- ?- patient admits to feeling anxious and having panic attacks which he knows makes his shortness of breath worse ?- encouraged him to discuss thi

## 2021-09-12 ENCOUNTER — Ambulatory Visit: Payer: Self-pay

## 2021-09-13 ENCOUNTER — Telehealth: Payer: Self-pay | Admitting: Family

## 2021-09-13 NOTE — Telephone Encounter (Signed)
Patient called to say that he hasn't taken any of his medications yet today. Says that yesterday he started feeling bad with worsening shortness of breath and heaviness in his legs and he was feeling like he may be overmedicated.   Started lisinopril at last visit due to his BP and he says that he thinks he may have had an issue with it when he took it previously. Advised patient to restart his furosemide and if we need to, we can try a different medication like losartan for his BP.   Patient verbalized understanding.

## 2021-09-15 ENCOUNTER — Inpatient Hospital Stay
Admission: EM | Admit: 2021-09-15 | Discharge: 2021-09-17 | DRG: 291 | Disposition: A | Discharge status: Home / Self Care | Payer: Medicaid Other | Attending: Internal Medicine | Admitting: Internal Medicine

## 2021-09-15 ENCOUNTER — Other Ambulatory Visit: Payer: Self-pay

## 2021-09-15 ENCOUNTER — Encounter: Payer: Self-pay | Admitting: Emergency Medicine

## 2021-09-15 ENCOUNTER — Inpatient Hospital Stay
Admit: 2021-09-15 | Discharge: 2021-09-15 | Disposition: A | Discharge status: Home / Self Care | Payer: Medicaid Other | Attending: Internal Medicine | Admitting: Internal Medicine

## 2021-09-15 ENCOUNTER — Emergency Department: Payer: Medicaid Other

## 2021-09-15 DIAGNOSIS — E440 Moderate protein-calorie malnutrition: Secondary | ICD-10-CM | POA: Insufficient documentation

## 2021-09-15 DIAGNOSIS — Z7982 Long term (current) use of aspirin: Secondary | ICD-10-CM | POA: Diagnosis not present

## 2021-09-15 DIAGNOSIS — F1410 Cocaine abuse, uncomplicated: Secondary | ICD-10-CM | POA: Diagnosis present

## 2021-09-15 DIAGNOSIS — I5033 Acute on chronic diastolic (congestive) heart failure: Secondary | ICD-10-CM

## 2021-09-15 DIAGNOSIS — I248 Other forms of acute ischemic heart disease: Secondary | ICD-10-CM | POA: Diagnosis present

## 2021-09-15 DIAGNOSIS — Z91128 Patient's intentional underdosing of medication regimen for other reason: Secondary | ICD-10-CM | POA: Diagnosis not present

## 2021-09-15 DIAGNOSIS — F1910 Other psychoactive substance abuse, uncomplicated: Secondary | ICD-10-CM | POA: Diagnosis present

## 2021-09-15 DIAGNOSIS — I10 Essential (primary) hypertension: Secondary | ICD-10-CM | POA: Diagnosis not present

## 2021-09-15 DIAGNOSIS — Z20822 Contact with and (suspected) exposure to covid-19: Secondary | ICD-10-CM | POA: Diagnosis present

## 2021-09-15 DIAGNOSIS — R7989 Other specified abnormal findings of blood chemistry: Secondary | ICD-10-CM | POA: Diagnosis not present

## 2021-09-15 DIAGNOSIS — R778 Other specified abnormalities of plasma proteins: Secondary | ICD-10-CM | POA: Diagnosis not present

## 2021-09-15 DIAGNOSIS — Z8249 Family history of ischemic heart disease and other diseases of the circulatory system: Secondary | ICD-10-CM

## 2021-09-15 DIAGNOSIS — F1010 Alcohol abuse, uncomplicated: Secondary | ICD-10-CM | POA: Diagnosis present

## 2021-09-15 DIAGNOSIS — F1210 Cannabis abuse, uncomplicated: Secondary | ICD-10-CM | POA: Diagnosis present

## 2021-09-15 DIAGNOSIS — I5042 Chronic combined systolic (congestive) and diastolic (congestive) heart failure: Secondary | ICD-10-CM | POA: Diagnosis present

## 2021-09-15 DIAGNOSIS — T501X6A Underdosing of loop [high-ceiling] diuretics, initial encounter: Secondary | ICD-10-CM | POA: Diagnosis present

## 2021-09-15 DIAGNOSIS — Z8616 Personal history of COVID-19: Secondary | ICD-10-CM | POA: Diagnosis not present

## 2021-09-15 DIAGNOSIS — F410 Panic disorder [episodic paroxysmal anxiety] without agoraphobia: Secondary | ICD-10-CM | POA: Diagnosis present

## 2021-09-15 DIAGNOSIS — I110 Hypertensive heart disease with heart failure: Secondary | ICD-10-CM | POA: Diagnosis present

## 2021-09-15 DIAGNOSIS — K761 Chronic passive congestion of liver: Secondary | ICD-10-CM | POA: Diagnosis present

## 2021-09-15 DIAGNOSIS — Z833 Family history of diabetes mellitus: Secondary | ICD-10-CM | POA: Diagnosis not present

## 2021-09-15 DIAGNOSIS — Z87891 Personal history of nicotine dependence: Secondary | ICD-10-CM

## 2021-09-15 DIAGNOSIS — E119 Type 2 diabetes mellitus without complications: Secondary | ICD-10-CM | POA: Diagnosis present

## 2021-09-15 DIAGNOSIS — J449 Chronic obstructive pulmonary disease, unspecified: Secondary | ICD-10-CM | POA: Diagnosis present

## 2021-09-15 DIAGNOSIS — I5043 Acute on chronic combined systolic (congestive) and diastolic (congestive) heart failure: Secondary | ICD-10-CM | POA: Diagnosis present

## 2021-09-15 DIAGNOSIS — R0602 Shortness of breath: Secondary | ICD-10-CM | POA: Diagnosis present

## 2021-09-15 DIAGNOSIS — Z79899 Other long term (current) drug therapy: Secondary | ICD-10-CM | POA: Diagnosis not present

## 2021-09-15 DIAGNOSIS — I509 Heart failure, unspecified: Principal | ICD-10-CM

## 2021-09-15 LAB — CBC WITH DIFFERENTIAL/PLATELET
Abs Immature Granulocytes: 0.02 10*3/uL (ref 0.00–0.07)
Basophils Absolute: 0 10*3/uL (ref 0.0–0.1)
Basophils Relative: 1 %
Eosinophils Absolute: 0.1 10*3/uL (ref 0.0–0.5)
Eosinophils Relative: 2 %
HCT: 38.4 % — ABNORMAL LOW (ref 39.0–52.0)
Hemoglobin: 12.7 g/dL — ABNORMAL LOW (ref 13.0–17.0)
Immature Granulocytes: 1 %
Lymphocytes Relative: 21 %
Lymphs Abs: 0.9 10*3/uL (ref 0.7–4.0)
MCH: 30.8 pg (ref 26.0–34.0)
MCHC: 33.1 g/dL (ref 30.0–36.0)
MCV: 93.2 fL (ref 80.0–100.0)
Monocytes Absolute: 0.3 10*3/uL (ref 0.1–1.0)
Monocytes Relative: 8 %
Neutro Abs: 2.8 10*3/uL (ref 1.7–7.7)
Neutrophils Relative %: 67 %
Platelets: 174 10*3/uL (ref 150–400)
RBC: 4.12 MIL/uL — ABNORMAL LOW (ref 4.22–5.81)
RDW: 12.2 % (ref 11.5–15.5)
WBC: 4.1 10*3/uL (ref 4.0–10.5)
nRBC: 0 % (ref 0.0–0.2)

## 2021-09-15 LAB — HEMOGLOBIN A1C
Hgb A1c MFr Bld: 4.8 % (ref 4.8–5.6)
Mean Plasma Glucose: 91.06 mg/dL

## 2021-09-15 LAB — URINE DRUG SCREEN, QUALITATIVE (ARMC ONLY)
Amphetamines, Ur Screen: NOT DETECTED
Barbiturates, Ur Screen: NOT DETECTED
Benzodiazepine, Ur Scrn: NOT DETECTED
Cannabinoid 50 Ng, Ur ~~LOC~~: POSITIVE — AB
Cocaine Metabolite,Ur ~~LOC~~: POSITIVE — AB
MDMA (Ecstasy)Ur Screen: NOT DETECTED
Methadone Scn, Ur: NOT DETECTED
Opiate, Ur Screen: NOT DETECTED
Phencyclidine (PCP) Ur S: NOT DETECTED
Tricyclic, Ur Screen: NOT DETECTED

## 2021-09-15 LAB — RESP PANEL BY RT-PCR (FLU A&B, COVID) ARPGX2
Influenza A by PCR: NEGATIVE
Influenza B by PCR: NEGATIVE
SARS Coronavirus 2 by RT PCR: NEGATIVE

## 2021-09-15 LAB — BRAIN NATRIURETIC PEPTIDE: B Natriuretic Peptide: 1826.5 pg/mL — ABNORMAL HIGH (ref 0.0–100.0)

## 2021-09-15 LAB — COMPREHENSIVE METABOLIC PANEL
ALT: 93 U/L — ABNORMAL HIGH (ref 0–44)
AST: 77 U/L — ABNORMAL HIGH (ref 15–41)
Albumin: 3.9 g/dL (ref 3.5–5.0)
Alkaline Phosphatase: 60 U/L (ref 38–126)
Anion gap: 8 (ref 5–15)
BUN: 28 mg/dL — ABNORMAL HIGH (ref 6–20)
CO2: 25 mmol/L (ref 22–32)
Calcium: 8.7 mg/dL — ABNORMAL LOW (ref 8.9–10.3)
Chloride: 107 mmol/L (ref 98–111)
Creatinine, Ser: 1.21 mg/dL (ref 0.61–1.24)
GFR, Estimated: >60 mL/min (ref >60)
Glucose, Bld: 152 mg/dL — ABNORMAL HIGH (ref 70–99)
Potassium: 3.7 mmol/L (ref 3.5–5.1)
Sodium: 140 mmol/L (ref 135–145)
Total Bilirubin: 0.9 mg/dL (ref 0.3–1.2)
Total Protein: 6.3 g/dL — ABNORMAL LOW (ref 6.5–8.1)

## 2021-09-15 LAB — TROPONIN I (HIGH SENSITIVITY)
Troponin I (High Sensitivity): 102 ng/L (ref <18)
Troponin I (High Sensitivity): 93 ng/L — ABNORMAL HIGH (ref <18)
Troponin I (High Sensitivity): 75 ng/L — ABNORMAL HIGH (ref <18)

## 2021-09-15 MED ORDER — ASPIRIN 81 MG PO TBEC
81.0000 mg | DELAYED_RELEASE_TABLET | Freq: Every day | ORAL | Status: DC
  Administered 2021-09-15 – 2021-09-17 (×3): 81 mg via ORAL
  Filled 2021-09-15 (×3): qty 1

## 2021-09-15 MED ORDER — LORAZEPAM 1 MG PO TABS
1.0000 mg | ORAL_TABLET | ORAL | Status: DC | PRN
  Administered 2021-09-15 (×2): 1 mg via ORAL
  Filled 2021-09-15: qty 2
  Filled 2021-09-15 (×2): qty 1

## 2021-09-15 MED ORDER — DM-GUAIFENESIN ER 30-600 MG PO TB12: 1.0000 | ORAL_TABLET | Freq: Two times a day (BID) | ORAL | Status: DC | PRN

## 2021-09-15 MED ORDER — ACETAMINOPHEN 325 MG PO TABS
650.0000 mg | ORAL_TABLET | Freq: Four times a day (QID) | ORAL | Status: DC | PRN
Start: 2021-09-15 — End: 2021-09-17

## 2021-09-15 MED ORDER — THIAMINE HCL 100 MG/ML IJ SOLN
100.0000 mg | Freq: Every day | INTRAMUSCULAR | Status: DC
  Filled 2021-09-15 (×2): qty 2

## 2021-09-15 MED ORDER — ENOXAPARIN SODIUM 40 MG/0.4ML IJ SOSY
40.0000 mg | PREFILLED_SYRINGE | INTRAMUSCULAR | Status: DC
  Administered 2021-09-15 – 2021-09-16 (×2): 40 mg via SUBCUTANEOUS
  Filled 2021-09-15 (×2): qty 0.4

## 2021-09-15 MED ORDER — FOLIC ACID 1 MG PO TABS
1.0000 mg | ORAL_TABLET | Freq: Every day | ORAL | Status: DC
  Administered 2021-09-15 – 2021-09-17 (×3): 1 mg via ORAL
  Filled 2021-09-15 (×3): qty 1

## 2021-09-15 MED ORDER — HYDRALAZINE HCL 20 MG/ML IJ SOLN: 5.0000 mg | INTRAMUSCULAR | Status: DC | PRN

## 2021-09-15 MED ORDER — NICOTINE 21 MG/24HR TD PT24
21.0000 mg | MEDICATED_PATCH | Freq: Every day | TRANSDERMAL | Status: DC
  Administered 2021-09-16 – 2021-09-17 (×2): 21 mg via TRANSDERMAL
  Filled 2021-09-15 (×2): qty 1

## 2021-09-15 MED ORDER — FUROSEMIDE 10 MG/ML IJ SOLN: 40.0000 mg | Freq: Two times a day (BID) | INTRAMUSCULAR | Status: DC

## 2021-09-15 MED ORDER — LORAZEPAM 2 MG/ML IJ SOLN: 0.0000 mg | Freq: Four times a day (QID) | INTRAMUSCULAR | Status: DC

## 2021-09-15 MED ORDER — IBUPROFEN 400 MG PO TABS
400.0000 mg | ORAL_TABLET | Freq: Four times a day (QID) | ORAL | Status: DC | PRN
Start: 2021-09-15 — End: 2021-09-17

## 2021-09-15 MED ORDER — ONDANSETRON HCL 4 MG/2ML IJ SOLN: 4.0000 mg | Freq: Three times a day (TID) | INTRAMUSCULAR | Status: DC | PRN

## 2021-09-15 MED ORDER — LORAZEPAM 2 MG/ML IJ SOLN: 1.0000 mg | INTRAMUSCULAR | Status: DC | PRN

## 2021-09-15 MED ORDER — FUROSEMIDE 10 MG/ML IJ SOLN
40.0000 mg | Freq: Once | INTRAMUSCULAR | Status: AC
  Administered 2021-09-15: 40 mg via INTRAVENOUS
  Filled 2021-09-15: qty 4

## 2021-09-15 MED ORDER — LISINOPRIL 5 MG PO TABS
2.5000 mg | ORAL_TABLET | Freq: Every day | ORAL | Status: DC
  Administered 2021-09-15 – 2021-09-17 (×3): 2.5 mg via ORAL
  Filled 2021-09-15 (×3): qty 1

## 2021-09-15 MED ORDER — THIAMINE HCL 100 MG PO TABS
100.0000 mg | ORAL_TABLET | Freq: Every day | ORAL | Status: DC
  Administered 2021-09-15 – 2021-09-17 (×3): 100 mg via ORAL
  Filled 2021-09-15 (×4): qty 1

## 2021-09-15 MED ORDER — LORAZEPAM 2 MG/ML IJ SOLN: 0.0000 mg | Freq: Two times a day (BID) | INTRAMUSCULAR | Status: DC

## 2021-09-15 MED ORDER — ADULT MULTIVITAMIN W/MINERALS CH
1.0000 | ORAL_TABLET | Freq: Every day | ORAL | Status: DC
  Administered 2021-09-15 – 2021-09-17 (×3): 1 via ORAL
  Filled 2021-09-15 (×3): qty 1

## 2021-09-15 MED ORDER — FUROSEMIDE 10 MG/ML IJ SOLN
40.0000 mg | Freq: Two times a day (BID) | INTRAMUSCULAR | Status: DC
  Administered 2021-09-15 – 2021-09-17 (×4): 40 mg via INTRAVENOUS
  Filled 2021-09-15 (×4): qty 4

## 2021-09-15 MED ORDER — PANTOPRAZOLE SODIUM 40 MG PO TBEC
40.0000 mg | DELAYED_RELEASE_TABLET | Freq: Every day | ORAL | Status: DC
  Administered 2021-09-15 – 2021-09-17 (×3): 40 mg via ORAL
  Filled 2021-09-15 (×3): qty 1

## 2021-09-15 MED ORDER — ALBUTEROL SULFATE (2.5 MG/3ML) 0.083% IN NEBU: 3.0000 mL | INHALATION_SOLUTION | RESPIRATORY_TRACT | Status: DC | PRN

## 2021-09-15 NOTE — Assessment & Plan Note (Signed)
no wheezing or rhonchi on auscultation. -Bronchodilators

## 2021-09-15 NOTE — Assessment & Plan Note (Signed)
Patient has bilateral ankle edema, elevated BNP 1826, shortness of breath, positive JVD, crackles on auscultation, clinically consistent with CHF exacerbation.  2D echo on 12/19/2017 showed EF 60-65%.   -Will admit to tele bed as inpatient -Lasix 40 mg bid by IV -2d echo -Daily weights -strict I/O's -Low salt diet -Fluid restriction -Obtain REDs Vest reading

## 2021-09-15 NOTE — Progress Notes (Signed)
*  PRELIMINARY RESULTS* Echocardiogram 2D Echocardiogram has been performed.  Claretta Fraise 09/15/2021, 3:50 PM

## 2021-09-15 NOTE — Assessment & Plan Note (Signed)
Troponin level 102, denies chest pain, likely demand ischemia in the setting of cocaine use. -Aspirin -Trend troponin -Check A1c, FLP, UDS

## 2021-09-15 NOTE — Assessment & Plan Note (Signed)
-  PRN iv Hydralazine -lisinopril

## 2021-09-15 NOTE — Progress Notes (Signed)
*  PRELIMINARY RESULTS* Echocardiogram 2D Echocardiogram has been performed.  Darius [name] 09/15/2021, 3:50 PM 

## 2021-09-15 NOTE — Assessment & Plan Note (Signed)
Polysubstance abuse including tobacco abuse, alcohol abuse, cocaine abuse, marijuana use -Did counseling about importance of quitting substance abuse -CIWA protocol for alcohol abuse -Nicotine patch for tobacco abuse

## 2021-09-15 NOTE — ED Notes (Signed)
Pt taken for xray

## 2021-09-15 NOTE — Assessment & Plan Note (Signed)
Patient has mild abnormal liver function, likely due to liver congestion secondary to CHF exacerbation -Avoid using Tylenol -Check HIV antibody and hepatitis panel

## 2021-09-15 NOTE — ED Triage Notes (Signed)
Pt via POV from home. Pt c/o SOB that got worse last night, denies CP. Pt also endorses dry cough. Pt has a hx of COPD and CHF. States that his ankles are more swollen than normal and he noticed it 4 days ago, pt has not been taking his Lasix as prescribed states he thinks he makes his more SOB. Pt is A&Ox4 and NAD

## 2021-09-15 NOTE — ED Provider Notes (Signed)
[facility] Provider Note    Event Date/Time   First MD Initiated Contact with Patient 09/15/21 [phone]     (approximate)   History   Shortness of Breath   HPI  Darius [name] is a 51 y.o. male with a history of COPD, CHF, hypertension, and type 2 diabetes who presents with increased shortness of breath over the last several days, but acutely worsened since last night.  The patient states that he is unable to lay flat.  He woke up early this morning very short of breath and then felt weak, causing him to get down on his knees.  He did not lose consciousness.  He reports some increased bilateral leg swelling, and states his legs have been feeling heavy.  He has a cough but no fever or chills.  He has no chest pain.   Physical Exam   Triage Vital Signs: ED Triage Vitals [09/15/21 0802]  Enc Vitals Group     BP 126/76     Pulse Rate 79     Resp (!) 22     Temp (!) 97.5 F (36.4 C)     Temp Source Oral     SpO2 100 %     Weight 155 lb (70.3 kg)     Height 6\' 2"  (1.88 m)     Head Circumference      Peak Flow      Pain Score 0     Pain Loc      Pain Edu?      Excl. in GC?     Most recent vital signs: Vitals:   09/15/21 0802  BP: 126/76  Pulse: 79  Resp: (!) 22  Temp: (!) 97.5 F (36.4 C)  SpO2: 100%    General: Awake, no distress.  CV:  Good peripheral perfusion.  Diastolic murmur. Resp:  Slightly increased respiratory effort when speaking.  Faint rales to bilateral lung bases. Abd:  No distention.  Other:  Trace bilateral lower extremity edema.  2+ DP pulses bilaterally.  Normal cap refill.   ED Results / Procedures / Treatments   Labs (all labs ordered are listed, but only abnormal results are displayed) Labs Reviewed  CBC WITH DIFFERENTIAL/PLATELET - Abnormal; Notable for the following components:      Result Value   RBC 4.12 (*)    Hemoglobin 12.7 (*)    HCT 38.4 (*)    All other components within normal limits  COMPREHENSIVE  METABOLIC PANEL - Abnormal; Notable for the following components:   Glucose, Bld 152 (*)    BUN 28 (*)    Calcium 8.7 (*)    Total Protein 6.3 (*)    AST 77 (*)    ALT 93 (*)    All other components within normal limits  BRAIN NATRIURETIC PEPTIDE - Abnormal; Notable for the following components:   B Natriuretic Peptide 1,826.5 (*)    All other components within normal limits  TROPONIN I (HIGH SENSITIVITY) - Abnormal; Notable for the following components:   Troponin I (High Sensitivity) 102 (*)    All other components within normal limits  RESP PANEL BY RT-PCR (FLU A&B, COVID) ARPGX2  URINE DRUG SCREEN, QUALITATIVE ([facility] ONLY)  HEMOGLOBIN A1C  TROPONIN I (HIGH SENSITIVITY)     EKG  ED ECG REPORT I, 09/17/21, the attending physician, personally viewed and interpreted this ECG.  Date: 09/15/2021 EKG Time: 0758 Rate: 82 Rhythm: normal sinus rhythm QRS Axis: normal Intervals: normal ST/T Wave  abnormalities: LVH, T wave inversions in inferior and lateral leads Narrative Interpretation: Nonspecific abnormalities with  no evidence of acute ischemia; no significant change when compared to EKG of 09/15/2020    RADIOLOGY  Chest x-ray: I independently viewed and interpreted the images; there is cardiomegaly with no focal consolidation or obvious edema  PROCEDURES:  Critical Care performed: No  Procedures   MEDICATIONS ORDERED IN ED: Medications  albuterol (VENTOLIN HFA) 108 (90 Base) MCG/ACT inhaler 2 puff (has no administration in time range)  dextromethorphan-guaiFENesin (MUCINEX DM) 30-600 MG per 12 hr tablet 1 tablet (has no administration in time range)  ondansetron (ZOFRAN) injection 4 mg (has no administration in time range)  acetaminophen (TYLENOL) tablet 650 mg (has no administration in time range)  hydrALAZINE (APRESOLINE) injection 5 mg (has no administration in time range)  furosemide (LASIX) injection 40 mg (40 mg Intravenous Given 09/15/21 0925)      IMPRESSION / MDM / ASSESSMENT AND PLAN / ED COURSE  I reviewed the triage vital signs and the nursing notes.  51 year old male with PMH as noted above presents with increased shortness of breath as well as some leg swelling.  I reviewed the past medical records.  The patient had a telephone encounter with FNP Bing Neighbors from the CHF clinic 2 days ago and said he had not taking any of his medications that day thinking he was overmedicated.  He was instructed to restart his furosemide.  He has no recent hospital admissions.  On exam the patient is overall well-appearing and his vital signs are normal but he does demonstrate some tachypnea and increased work of breathing when speaking.  O2 saturation is in the high 90s on room air.  He does have some rales in the bilateral lung bases and trace bilateral lower extremity edema.  Differential diagnosis includes, but is not limited to, CHF exacerbation, fluid overload due to other etiology, COPD exacerbation, acute bronchitis, COVID-19, influenza.  Patient has no chest pain and I have a low suspicion for ACS or PE.  We will obtain chest x-ray, lab work-up, and reassess.  The patient is on the cardiac monitor to evaluate for evidence of arrhythmia and/or significant heart rate changes.  ----------------------------------------- 10:03 AM on 09/15/2021 -----------------------------------------  Chest x-ray shows cardiomegaly with no significant edema however clinically the patient has rales and I suspect some pulmonary vascular congestion or edema.  BNP is elevated.  Troponin is also elevated.  The patient has no chest pain or EKG changes to indicate ACS, so this may be demand related to the heart failure.  Other lab work-up is unremarkable.  Given the patient's shortness of breath on minimal exertion and the abnormal lab findings we will admit for further management.  I gave a dose of IV Lasix here.  I consulted Dr. Clyde Lundborg from the hospitalist  service; based on her discussion he agrees to admit the patient   FINAL CLINICAL IMPRESSION(S) / ED DIAGNOSES   Final diagnoses:  Acute on chronic congestive heart failure, unspecified heart failure type (HCC)  Elevated troponin     Rx / DC Orders   ED Discharge Orders     None        Note:  This document was prepared using Dragon voice recognition software and may include unintentional dictation errors.    Dionne Bucy, MD 09/15/21 1004

## 2021-09-15 NOTE — TOC Initial Note (Signed)
Transition of Care [facility]) - Initial/Assessment Note    Patient Details  Name: Darius [name] MRN: [id number] Date of Birth: 13-Aug-1970  [facility] of [facility]) CM/SW Contact:    Merrily Brittle, LCSWA Phone Number: 09/15/2021, 11:05 AM  Clinical Narrative:                  [facility] consult acknowledged.  CSW attached substance use resources to patient's AVS. Provider will place PT order if indicated.     Patient Goals and CMS Choice        Expected Discharge Plan and Services                                                Prior Living Arrangements/Services                       Activities of Daily Living      Permission Sought/Granted                  Emotional Assessment              Admission diagnosis:  Acute on chronic diastolic CHF (congestive heart failure) (HCC) [I50.33] Patient Active Problem List   Diagnosis Date Noted   Acute on chronic diastolic CHF (congestive heart failure) (HCC) 09/15/2021   COPD (chronic obstructive pulmonary disease) (HCC) 09/15/2021   Elevated troponin 09/15/2021   Abnormal LFTs 09/15/2021   Polysubstance abuse (HCC) 09/15/2021   Acid reflux 08/27/2021   Nasal congestion 08/27/2021   History of panic attacks 05/29/2021   Ascending aorta dilatation (HCC) 05/29/2021   Shortness of breath 08/15/2020   Encounter to establish care 07/11/2020   Abdominal pain 07/11/2020   HTN (hypertension) 12/30/2017   Tobacco use 12/30/2017   Substance use disorder 12/30/2017   CHF (congestive heart failure) (HCC) 12/18/2017   PCP:  Rolm Gala, NP Pharmacy:   [id number] [place], [place] - [address] AT [facility] OF [address] [place] [address] Phone: [phone] Fax: [phone]  [facility] [address] [place] [address] Phone: [phone] Fax: [phone]     Social Determinants of Health (SDOH)  Interventions    Readmission Risk Interventions     View : No data to display.

## 2021-09-15 NOTE — H&P (Signed)
History and Physical    Darius [name]:[id number] DOB: [birth year] DOA: 09/15/2021  Referring MD/NP/PA:   PCP: Rolm Gala, NP   Patient coming from:  The patient is coming from home.  At baseline, pt is independent for most of ADL.        Chief Complaint: SOB  HPI: Darius [name] is a 51 y.o. male with medical history significant of dCHF, hypertension, diet-controlled diabetes, COPD, GERD, polysubstance abuse (Alcohol, alcohol, cocaine, marijuana), remote seizure, panic attack, ascending aorta dilation, who presents with shortness of breath.  Patient states that he developed shortness of breath since last night, which has been progressively worsening.  Patient has dry cough, denies chest pain, fever.  Patient has chills.  No nausea, vomiting, diarrhea or abdominal pain.  No symptoms of UTI.  He has worsening ankle edema bilaterally.   Data Reviewed and ED Course: pt was found to have BNP 1826, troponin level 102, negative COVID PCR, GFR> 60, mild abnormal liver function (ALP 60, AST 77, ALT 93, total bilirubin 0.9), temperature normal, blood pressure 126/76, heart rate 79, RR 22, oxygen saturation 100% on room air.  Chest x-ray showed cardiomegaly.  Patient is admitted to telemetry bed as inpatient.  EKG: I have personally reviewed.  Sinus rhythm, QTc 466, left atrial enlargement, T wave inversion in V3-V6, and in inferior leads.   Review of Systems:   General: no fevers, has chills, no body weight gain, has fatigue HEENT: no blurry vision, hearing changes or sore throat Respiratory: has dyspnea, coughing, no wheezing CV: no chest pain, no palpitations GI: no nausea, vomiting, abdominal pain, diarrhea, constipation GU: no dysuria, burning on urination, increased urinary frequency, hematuria  Ext: has leg edema Neuro: no unilateral weakness, numbness, or tingling, no vision change or hearing loss Skin: no rash, no skin tear. MSK: No muscle spasm, no deformity, no limitation of  range of movement in spin Heme: No easy bruising.  Travel history: No recent long distant travel.   Allergy:  Allergies  Allergen Reactions   Avelox [Moxifloxacin] Other (See Comments)    Thoracic Aneurysm.   Ciprofloxacin Other (See Comments)    Has thoracic Aneurysm   Penicillins Other (See Comments)    Patient states that mother is highly allergic. 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  Has patient had a PCN reaction causing immediate rash, facial/tongue/throat swelling, SOB or lightheadedness with hypotension: No Has patient had a PCN reaction causing severe rash involving mucus membranes or skin necrosis: No Has patient had a PCN reaction that required hospitalization Yes Has patient had a PCN reaction occurring within the last 10 years: No If all of the above answers are "N    Past Medical History:  Diagnosis Date   CHF (congestive heart failure) (HCC)    a.) TTE on 12/19/2017 --> LVEF 60-65%, no RWMAs, mild LA dilitation, PASP 44 mmHg   COPD (chronic obstructive pulmonary disease) (HCC)    DOE (dyspnea on exertion)    Glaucoma    History of 2019 novel coronavirus disease (COVID-19) 09/15/2020   Hypertension    Moderate concentric left ventricular hypertrophy 12/19/2017   Nephrolithiasis    Polysubstance abuse (HCC)    ETOH, marijuana, cocaine   Seizures (HCC)    T2DM (type 2 diabetes mellitus) (HCC)    a.)  Controlled with diet lifestyle modifications   Valvular heart disease    a.) TTE on 12/19/2017 --> mild MV regurgitation; moderate AV regurgitation  Past Surgical History:  Procedure Laterality Date   CYSTOSCOPY/URETEROSCOPY/HOLMIUM LASER/STENT PLACEMENT Right 01/29/2021   Procedure: CYSTOSCOPY/URETEROSCOPY/HOLMIUM LASER/STENT PLACEMENT;  Surgeon: Riki Altes, MD;  Location: [facility] ORS;  Service: Urology;  Laterality: Right;   NO PAST SURGERIES      Social History:  reports that he quit smoking about 12 months ago. His smoking use  included cigarettes. He has a 8.75 pack-year smoking history. He has never used smokeless tobacco. He reports current alcohol use of about 2.0 standard drinks per week. He reports current drug use. Drugs: Marijuana, Other-see comments, and "Crack" cocaine.  Family History:  Family History  Problem Relation Age of Onset   Hypertension Mother    Kidney disease Mother    Diabetes Mother    Mitral valve prolapse Mother    Diabetes Father    Hypertension Maternal Grandmother    Mitral valve prolapse Maternal Grandmother    Other Half-Brother        accident     Prior to Admission medications   Medication Sig Start Date End Date Taking? Authorizing Provider  albuterol (PROVENTIL HFA) 108 (90 Base) MCG/ACT inhaler Inhale 2 puffs into the lungs once every 6 (six) hours as needed for wheezing or shortness of breath. 08/27/21   Iloabachie, Chioma E, NP  aspirin EC 81 MG tablet Take 81 mg by mouth daily. Swallow whole.    [provider]  fluticasone (FLONASE) 50 MCG/ACT nasal spray Place 1 spray into both nostrils once daily. Patient not taking: Reported on 09/06/2021 08/27/21   Iloabachie, Chioma E, NP  furosemide (LASIX) 20 MG tablet Take 20 mg by mouth.    [provider]  HYDROcodone-acetaminophen (NORCO/VICODIN) 5-325 MG tablet Take 1 tablet by mouth every 6 (six) hours as needed for moderate pain. 01/29/21   Stoioff, Verna Czech, MD  lisinopril (ZESTRIL) 2.5 MG tablet Take 2.5 mg by mouth daily.    [provider]  omeprazole (PRILOSEC) 20 MG capsule Take 1 capsule (20 mg total) by mouth once daily. 08/27/21   Eulogio Bear E, NP    Physical Exam: Vitals:   09/15/21 0802 09/15/21 1104 09/15/21 1105  BP: 126/76 (!) 137/101   Pulse: 79 79   Resp: (!) 22  16  Temp: (!) 97.5 F (36.4 C)    TempSrc: Oral    SpO2: 100% 100%   Weight: 70.3 kg    Height:  (1.88 m)     General: Not in acute distress HEENT:       Eyes: PERRL, EOMI, no scleral icterus.       ENT:  No discharge from the ears and nose, no pharynx injection, no tonsillar enlargement.        Neck: positive JVD, no bruit, no mass felt. Heme: No neck lymph node enlargement. Cardiac: S1/S2, RRR, No murmurs, No gallops or rubs. Respiratory: Has fine crackles bilaterally GI: Soft, nondistended, nontender, no rebound pain, no organomegaly, BS present. GU: No hematuria Ext: Has ankle edema bilaterally. 1+DP/PT pulse bilaterally. Musculoskeletal: No joint deformities, No joint redness or warmth, no limitation of ROM in spin. Skin: No rashes.  Neuro: Alert, oriented X3, cranial nerves II-XII grossly intact, moves all extremities normally.  Psych: Patient is not psychotic, no suicidal or hemocidal ideation.  Labs on Admission: I have personally reviewed following labs and imaging studies  CBC: Recent Labs  Lab 09/15/21 0817  WBC 4.1  NEUTROABS 2.8  HGB 12.7*  HCT 38.4*  MCV 93.2  PLT 174  Basic Metabolic Panel: Recent Labs  Lab 09/15/21 0817  NA 140  K 3.7  CL 107  CO2 25  GLUCOSE 152*  BUN 28*  CREATININE 1.21  CALCIUM 8.7*   GFR: Estimated Creatinine Clearance: 70.2 mL/min (by C-G formula based on SCr of 1.21 mg/dL). Liver Function Tests: Recent Labs  Lab 09/15/21 0817  AST 77*  ALT 93*  ALKPHOS 60  BILITOT 0.9  PROT 6.3*  ALBUMIN 3.9   No results for input(s): LIPASE, AMYLASE in the last 168 hours. No results for input(s): AMMONIA in the last 168 hours. Coagulation Profile: No results for input(s): INR, PROTIME in the last 168 hours. Cardiac Enzymes: No results for input(s): CKTOTAL, CKMB, CKMBINDEX, TROPONINI in the last 168 hours. BNP (last 3 results) No results for input(s): PROBNP in the last 8760 hours. HbA1C: No results for input(s): HGBA1C in the last 72 hours. CBG: No results for input(s): GLUCAP in the last 168 hours. Lipid Profile: No results for input(s): CHOL, HDL, LDLCALC, TRIG, CHOLHDL, LDLDIRECT in the last 72 hours. Thyroid Function  Tests: No results for input(s): TSH, T4TOTAL, FREET4, T3FREE, THYROIDAB in the last 72 hours. Anemia Panel: No results for input(s): VITAMINB12, FOLATE, FERRITIN, TIBC, IRON, RETICCTPCT in the last 72 hours. Urine analysis:    Component Value Date/Time   COLORURINE YELLOW 04/22/2021 1352   APPEARANCEUR CLEAR 04/22/2021 1352   APPEARANCEUR Cloudy (A) 02/07/2021 0925   LABSPEC 1.025 04/22/2021 1352   PHURINE 7.0 04/22/2021 1352   GLUCOSEU NEGATIVE 04/22/2021 1352   HGBUR NEGATIVE 04/22/2021 1352   BILIRUBINUR NEGATIVE 04/22/2021 1352   BILIRUBINUR Negative 02/07/2021 0925   KETONESUR NEGATIVE 04/22/2021 1352   PROTEINUR NEGATIVE 04/22/2021 1352   UROBILINOGEN 1.0 02/26/2007 1019   NITRITE NEGATIVE 04/22/2021 1352   LEUKOCYTESUR NEGATIVE 04/22/2021 1352   Sepsis Labs: @LABRCNTIP (procalcitonin:4,lacticidven:4) ) Recent Results (from the past 240 hour(s))  Resp Panel by RT-PCR (Flu A&B, Covid) Nasopharyngeal Swab     Status: None   Collection Time: 09/15/21  8:17 AM   Specimen: Nasopharyngeal Swab; Nasopharyngeal(NP) swabs in vial transport medium  Result Value Ref Range Status   SARS Coronavirus 2 by RT PCR NEGATIVE NEGATIVE Final    Comment: (NOTE) SARS-CoV-2 target nucleic acids are NOT DETECTED.  The SARS-CoV-2 RNA is generally detectable in upper respiratory specimens during the acute phase of infection. The lowest concentration of SARS-CoV-2 viral copies this assay can detect is 138 copies/mL. A negative result does not preclude SARS-Cov-2 infection and should not be used as the sole basis for treatment or other patient management decisions. A negative result may occur with  improper specimen collection/handling, submission of specimen other than nasopharyngeal swab, presence of viral mutation(s) within the areas targeted by this assay, and inadequate number of viral copies(<138 copies/mL). A negative result must be combined with clinical observations, patient history,  and epidemiological information. The expected result is Negative.  Fact Sheet for Patients:  09/17/21  Fact Sheet for Healthcare Providers:  BloggerCourse.com  This test is no t yet approved or cleared by the [place] FDA and  has been authorized for detection and/or diagnosis of SARS-CoV-2 by FDA under an Emergency Use Authorization (EUA). This EUA will remain  in effect (meaning this test can be used) for the duration of the COVID-19 declaration under Section 564(b)(1) of the Act, 21 U.S.C.section 360bbb-3(b)(1), unless the authorization is terminated  or revoked sooner.       Influenza A by PCR NEGATIVE NEGATIVE Final   Influenza B  by PCR NEGATIVE NEGATIVE Final    Comment: (NOTE) The Xpert Xpress SARS-CoV-2/FLU/RSV plus assay is intended as an aid in the diagnosis of influenza from Nasopharyngeal swab specimens and should not be used as a sole basis for treatment. Nasal washings and aspirates are unacceptable for Xpert Xpress SARS-CoV-2/FLU/RSV testing.  Fact Sheet for Patients: BloggerCourse.com  Fact Sheet for Healthcare Providers: SeriousBroker.it  This test is not yet approved or cleared by the [place] FDA and has been authorized for detection and/or diagnosis of SARS-CoV-2 by FDA under an Emergency Use Authorization (EUA). This EUA will remain in effect (meaning this test can be used) for the duration of the COVID-19 declaration under Section 564(b)(1) of the Act, 21 U.S.C. section 360bbb-3(b)(1), unless the authorization is terminated or revoked.  Performed at [facility], [address] [place], [place] [address]      Radiological Exams on Admission: DG Chest 2 View  Result Date: 09/15/2021 CLINICAL DATA:  51 year old male with history of worsening shortness of breath since yesterday evening. Dry cough. EXAM: CHEST - 2 VIEW  COMPARISON:  Chest x-ray 04/22/2021. FINDINGS: Lung volumes are normal. No consolidative airspace disease. No pleural effusions. No pneumothorax. No evidence of pulmonary edema. Mild cardiomegaly. Upper mediastinal contours are within normal limits. Atherosclerotic calcifications in the thoracic aorta. IMPRESSION: 1. Cardiomegaly. 2. Aortic atherosclerosis. Electronically Signed   By: Trudie Reed M.D.   On: 09/15/2021 08:43      Assessment/Plan Principal Problem:   Acute on chronic diastolic CHF (congestive heart failure) (HCC) Active Problems:   COPD (chronic obstructive pulmonary disease) (HCC)   HTN (hypertension)   Elevated troponin   Abnormal LFTs   Polysubstance abuse (HCC)   Principal Problem:   Acute on chronic diastolic CHF (congestive heart failure) (HCC) Active Problems:   COPD (chronic obstructive pulmonary disease) (HCC)   HTN (hypertension)   Elevated troponin   Abnormal LFTs   Polysubstance abuse (HCC)   Assessment and Plan: * Acute on chronic diastolic CHF (congestive heart failure) (HCC) Patient has bilateral ankle edema, elevated BNP 1826, shortness of breath, positive JVD, crackles on auscultation, clinically consistent with CHF exacerbation.  2D echo on 12/19/2017 showed EF 60-65%.   -Will admit to tele bed as inpatient -Lasix 40 mg bid by IV -2d echo -Daily weights -strict I/O's -Low salt diet -Fluid restriction -Obtain REDs Vest reading  COPD (chronic obstructive pulmonary disease) (HCC) no wheezing or rhonchi on auscultation. -Bronchodilators  Polysubstance abuse (HCC) Polysubstance abuse including tobacco abuse, alcohol abuse, cocaine abuse, marijuana use -Did counseling about importance of quitting substance abuse -CIWA protocol for alcohol abuse -Nicotine patch for tobacco abuse  Abnormal LFTs Patient has mild abnormal liver function, likely due to liver congestion secondary to CHF exacerbation -Avoid using Tylenol -Check HIV antibody  and hepatitis panel  Elevated troponin Troponin level 102, denies chest pain, likely demand ischemia in the setting of cocaine use. -Aspirin -Trend troponin -Check A1c, FLP, UDS  HTN (hypertension) -PRN iv Hydralazine -lisinopril             DVT ppx:  SQ Lovenox  Code Status: Full code  Family Communication:  Yes, patient's  wife   at bed side.   Disposition Plan:  Anticipate discharge back to previous environment  Consults called:  none  Admission status and Level of care: Telemetry Cardiac:  as inpt            Severity of Illness:  The appropriate patient status for this patient is  INPATIENT. Inpatient status is judged to be reasonable and necessary in order to provide the required intensity of service to ensure the patient's safety. The patient's presenting symptoms, physical exam findings, and initial radiographic and laboratory data in the context of their chronic comorbidities is felt to place them at high risk for further clinical deterioration. Furthermore, it is not anticipated that the patient will be medically stable for discharge from the hospital within 2 midnights of admission.   * I certify that at the point of admission it is my clinical judgment that the patient will require inpatient hospital care spanning beyond 2 midnights from the point of admission due to high intensity of service, high risk for further deterioration and high frequency of surveillance required.*       Date of Service 09/15/2021    Lorretta Harp [facility]   If 7PM-7AM, please contact night-coverage [email or web address] 09/15/2021, 11:38 AM

## 2021-09-16 LAB — BASIC METABOLIC PANEL
Anion gap: 9 (ref 5–15)
BUN: 25 mg/dL — ABNORMAL HIGH (ref 6–20)
CO2: 28 mmol/L (ref 22–32)
Calcium: 9.5 mg/dL (ref 8.9–10.3)
Chloride: 105 mmol/L (ref 98–111)
Creatinine, Ser: 1.26 mg/dL — ABNORMAL HIGH (ref 0.61–1.24)
GFR, Estimated: >60 mL/min (ref >60)
Glucose, Bld: 86 mg/dL (ref 70–99)
Potassium: 3.8 mmol/L (ref 3.5–5.1)
Sodium: 142 mmol/L (ref 135–145)

## 2021-09-16 LAB — ECHOCARDIOGRAM COMPLETE
AR max vel: 2.36 cm2
AV Area VTI: 2.27 cm2
AV Area mean vel: 2.28 cm2
AV Mean grad: 10.7 mmHg
AV Peak grad: 20.9 mmHg
Ao pk vel: 2.29 m/s
Area-P 1/2: 4.74 cm2
Calc EF: 50 %
Height: 74 in
P 1/2 time: 412 msec
S' Lateral: 5.1 cm
Single Plane A2C EF: 50.4 %
Single Plane A4C EF: 50.9 %
Weight: 2480 oz

## 2021-09-16 LAB — LIPID PANEL
Cholesterol: 167 mg/dL (ref 0–200)
HDL: 51 mg/dL (ref >40)
LDL Cholesterol: 102 mg/dL — ABNORMAL HIGH (ref 0–99)
Total CHOL/HDL Ratio: 3.3 RATIO
Triglycerides: 71 mg/dL (ref <150)
VLDL: 14 mg/dL (ref 0–40)

## 2021-09-16 LAB — HEPATITIS PANEL, ACUTE
HCV Ab: NONREACTIVE
Hep A IgM: NONREACTIVE
Hep B C IgM: NONREACTIVE
Hepatitis B Surface Ag: NONREACTIVE

## 2021-09-16 LAB — MAGNESIUM: Magnesium: 2.1 mg/dL (ref 1.7–2.4)

## 2021-09-16 LAB — HIV ANTIBODY (ROUTINE TESTING W REFLEX): HIV Screen 4th Generation wRfx: NONREACTIVE

## 2021-09-16 MED ORDER — ENSURE ENLIVE PO LIQD
237.0000 mL | Freq: Two times a day (BID) | ORAL | Status: DC
  Administered 2021-09-17: 237 mL via ORAL

## 2021-09-16 NOTE — Progress Notes (Signed)
[facility]  - [place] [facility]   PATIENT NAME: Darius [name]    MR#:  [id number]  DATE OF BIRTH:  22-Jul-1970  SUBJECTIVE:  Marvel Plan bedside came in with increasing shortness of breath. Patient has been using cocaine marijuana and drinking alcohol. He also has not been taking his Lasix as he is supposed to. Denies any other complaints other than feeling tired and weak.    VITALS:  Blood pressure (!) 142/79, pulse 81, temperature 97.6 F (36.4 C), temperature source Oral, resp. rate 16, height 6\' 2"  (1.88 m), weight 67.4 kg, SpO2 100 %.  PHYSICAL EXAMINATION:   GENERAL:  51 y.o.-year-old patient lying in the bed with no acute distress.  LUNGS: Normal breath sounds bilaterally, no wheezing, rales, rhonchi.  CARDIOVASCULAR: S1, S2 normal. No murmurs, rubs, or gallops.  ABDOMEN: Soft, nontender, nondistended. Bowel sounds present.  EXTREMITIES: No  edema b/l.    NEUROLOGIC: nonfocal  patient is alert and awake SKIN: No obvious rash, lesion, or ulcer.   LABORATORY PANEL:  CBC Recent Labs  Lab 09/15/21 0817  WBC 4.1  HGB 12.7*  HCT 38.4*  PLT 174    Chemistries  Recent Labs  Lab 09/15/21 0817 09/16/21 0326  NA 140 142  K 3.7 3.8  CL 107 105  CO2 25 28  GLUCOSE 152* 86  BUN 28* 25*  CREATININE 1.21 1.26*  CALCIUM 8.7* 9.5  MG  --  2.1  AST 77*  --   ALT 93*  --   ALKPHOS 60  --   BILITOT 0.9  --    Cardiac Enzymes No results for input(s): TROPONINI in the last 168 hours. RADIOLOGY:  DG Chest 2 View  Result Date: 09/15/2021 CLINICAL DATA:  51 year old male with history of worsening shortness of breath since yesterday evening. Dry cough. EXAM: CHEST - 2 VIEW COMPARISON:  Chest x-ray 04/22/2021. FINDINGS: Lung volumes are normal. No consolidative airspace disease. No pleural effusions. No pneumothorax. No evidence of pulmonary edema. Mild cardiomegaly. Upper mediastinal contours are within normal limits. Atherosclerotic calcifications in the  thoracic aorta. IMPRESSION: 1. Cardiomegaly. 2. Aortic atherosclerosis. Electronically Signed   By: 04/24/2021 M.D.   On: 09/15/2021 08:43    Assessment and Plan Darius [name] is a 51 y.o. male with medical history significant of dCHF, hypertension, diet-controlled diabetes, COPD, GERD, polysubstance abuse (Alcohol, alcohol, cocaine, marijuana), remote seizure, panic attack, ascending aorta dilation, who presents with shortness of breath.  Acute on chronic diastolic CHF (congestive heart failure) (HCC) Patient has bilateral ankle edema, elevated BNP 1826, shortness of breath, positive JVD, crackles on auscultation, clinically consistent with CHF exacerbation.  2D echo on 12/19/2017 showed EF 60-65%.  -Lasix 40 mg bid by IV -Daily weights -strict I/O's -Low salt diet -Fluid restriction -patient advised to stop drinking alcohol and abstain from drugs. He is also recommended to comply with his medication intake   COPD (chronic obstructive pulmonary disease) (HCC) no wheezing or rhonchi on auscultation. -Bronchodilators   Polysubstance abuse (HCC) Polysubstance abuse including tobacco abuse, alcohol abuse, cocaine abuse, marijuana use -Did counseling about importance of quitting substance abuse -CIWA protocol for alcohol abuse -Nicotine patch for tobacco abuse   Abnormal LFTs Patient has mild abnormal liver function, likely due to liver congestion secondary to CHF exacerbation -Avoid using Tylenol   Elevated troponin Troponin level 102, denies chest pain, likely demand ischemia in the setting of cocaine use. -Aspirin -denies any chest pain. No acute EKG changes  HTN (hypertension) -PRN  iv Hydralazine -lisinopril   CHF education none. Will continue to monitor for another day. If shows improvement will discharge tomorrow. Discuss with wife at bedside.    Procedures: Family communication : wife at bedside Consults : none CODE STATUS: full DVT Prophylaxis : Lovenox Level of  care: Telemetry Cardiac Status is: Inpatient Remains inpatient appropriate because: acute on chronic CHF. Patient will be continued on IV Lasix today. If remain stable will discharge tomorrow    TOTAL TIME TAKING CARE OF THIS PATIENT: 35 minutes.  >50% time spent on counselling and coordination of care  Note: This dictation was prepared with Dragon dictation along with smaller phrase technology. Any transcriptional errors that result from this process are unintentional.  Enedina Finner M.D    [facility]   CC: Primary care physician; Rolm Gala, NP

## 2021-09-16 NOTE — Progress Notes (Signed)
Initial Nutrition Assessment  DOCUMENTATION CODES:   Non-severe (moderate) malnutrition in context of chronic illness  INTERVENTION:   -MVI with minerals daily -Ensure Enlive po BID, each supplement provides 350 kcal and 20 grams of protein  NUTRITION DIAGNOSIS:   Moderate Malnutrition related to chronic illness (CHF) as evidenced by mild fat depletion, moderate fat depletion, mild muscle depletion, moderate muscle depletion.  GOAL:   Patient will meet greater than or equal to 90% of their needs  MONITOR:   PO intake, Supplement acceptance  REASON FOR ASSESSMENT:   Rounds    ASSESSMENT:   Pt with medical history significant of dCHF, hypertension, diet-controlled diabetes, COPD, GERD, polysubstance abuse (Alcohol, alcohol, cocaine, marijuana), remote seizure, panic attack, ascending aorta dilation, who presents with shortness of breath.  Pt admitted with CHF.   Reviewed I/O's: -1.4 L x 24 hours  UOP: 1.8 L x 24 hours  Spoke with pt and wife at bedside. Pt reports feeling poorly for the past few months and has been depressed over his poor health. He shares with this RD that his wife has been living with her parents to help care for them and pt is alone most of the day and feels socially isolated. Due to his SOB, he is unable to be as active as he usually is and this also depresses him. His wife describes him as "someone who can't sit still".   Pt shares that he enjoys cooking, but admits to using "soul food seasoning" when cooking. Pt shares that he grazes at home and does not follow a meal schedule.    Pt reports his UBW is around 220#. He shares with this RD that he has had progressive weight loss, which he attributes to being concerned about being diagnosed with DM. Reviewed wt hx; pt has experienced a 5.6% wt loss over the past 3 months, which is not significant for time frame.   RD provided "Low Sodium Nutrition Therapy" handout from the Academy of [employer] and  Diet[employer]. Reviewed patient's dietary recall. Provided examples on ways to decrease sodium intake in diet. Discouraged intake of processed foods and use of salt shaker. Encouraged fresh fruits and vegetables as well as whole grain sources of carbohydrates to maximize fiber intake.   RD discussed why it is important for patient to adhere to diet recommendations, and emphasized the role of fluids, foods to avoid, and importance of weighing self daily. Teach back method used.  Discussed importance of good meal and supplement intake to promote healing. Pt amenable to Ensure supplements, states he has tried them in the past.    Medications reviewed and include lasix, ativan, and thiamine.  Labs reviewed: CBGS: 77 (inpatient orders for glycemic control are none).     NUTRITION - FOCUSED PHYSICAL EXAM:  Flowsheet Row Most Recent Value  Orbital Region Moderate depletion  Upper Arm Region Mild depletion  Thoracic and Lumbar Region Moderate depletion  Buccal Region Moderate depletion  Temple Region Moderate depletion  Clavicle Bone Region Moderate depletion  Clavicle and Acromion Bone Region Moderate depletion  Scapular Bone Region Moderate depletion  Dorsal Hand Mild depletion  Patellar Region Moderate depletion  Anterior Thigh Region Moderate depletion  Posterior Calf Region Moderate depletion  Edema (RD Assessment) None  Hair Reviewed  Eyes Reviewed  Mouth Reviewed  Skin Reviewed  Nails Reviewed       Diet Order:   Diet Order             Diet 2 gram sodium Room  service appropriate? Yes; Fluid consistency: Thin  Diet effective now                   EDUCATION NEEDS:   Education needs have been addressed  Skin:  Skin Assessment: Reviewed RN Assessment  Last BM:  Unknown  Height:   Ht Readings from Last 1 Encounters:  09/15/21 6\' 2"  (1.88 m)    Weight:   Wt Readings from Last 1 Encounters:  09/16/21 67.4 kg    Ideal Body Weight:  80.9 kg  BMI:  Body mass  index is 19.09 kg/m.  Estimated Nutritional Needs:   Kcal:  2150-2350  Protein:  120-135 grams  Fluid:  > 2 L    04-05-1990, RD, LDN, CDCES Registered Dietitian II Certified Diabetes Care and Education Specialist Please refer to [facility] for RD and/or RD on-call/weekend/after hours pager

## 2021-09-16 NOTE — Consult Note (Addendum)
   Heart Failure Nurse Navigator Note  HFpEF 60 to 65%.  Mild to moderate aortic insufficiency.  Mild mitral regurgitation.  Mild to moderate elevation of pulmonary systolic pressures.  Moderate LVH by echocardiogram performed in 2019.  Echo performed on this admission results are still pending.  He presented to the emergency room with complaints of worsening shortness of breath and lower extremity edema.  Comorbidities:  COPD Hypertension Polysubstance abuse Type 2 diabetes GERD  Medications:  Aspirin 81 mg daily Furosemide 40 mg IV every 12 hours Lisinopril 2-1/2 mg daily  Labs:  Sodium 142, potassium 3.8, chloride 105, CO2 28, BUN 25, creatinine 1.26, BNP 1826 Weight is 67.4 kg down from 70.3 of yesterday Blood pressure 129/88 Intake 320 mL Output 1750 mL  Initial meeting with patient on this admission.  Wife was at the bedside.  Discussed heart failure and how it relates to him.  He states do to not feeling right he had stopped taking the lisinopril and lasix but when he noted the lower extremity edema he started the lasix back up --stating it helped the edema a little but not the SOB.  He does not weigh himself on a daily basis. States maybe once a week and right now the scale is needing new batteries. He and his wife both say that they can replace them.  Discussed daily weights and what to report.  He admits to using salt at the table, specially at breakfast on his eggs.  Talked about the relationship with sodium and liquids. Discussed ways to seasoning foods without adding salt. wife states that they do frequent restaurants such as [employer] quite frequently.  Discussed making healthy choices when eating at restaurants.  Stressed being compliant with medications, if he felt that he was having side effects that he should be in touch with his PCP or with the heart failure clinic.  Discussed his polysubstance abuse,he states that he was smoking prior to admission and also  using cocaine and marijuana. He admits to heavy alcohol intake in April with celebrating his birthday. Talked about the alcohol, drugs and cigarettes being toxins that affect his heart.  He states that he felt "so bad" prior to admission he does not wish to feel that way again.  He was given the living with heart failure teaching booklet, handout on sodium, weight chart and zone magnet.  He has an appointment in the heart failure clinic on October 04, 2021.  He has a 23 % no show-which is 14 out of 62 appointments.  Patient and his wife had no further questions.  Pricilla Riffle RN CHFN

## 2021-09-16 NOTE — TOC Initial Note (Signed)
Transition of Care [facility]) - Initial/Assessment Note    Patient Details  Name: Darius [name] MRN: [id number] Date of Birth: 09-May-1970  Transition of Care [facility] CM/SW Contact:    Truddie Hidden, RN Phone Number: 09/16/2021, 9:47 AM  Clinical Narrative:                 [facility] consulted for heart failure screen. Heart failure RN Ricarda Frame has been informed.          Patient Goals and CMS Choice        Expected Discharge Plan and Services                                                Prior Living Arrangements/Services                       Activities of Daily Living Home Assistive Devices/Equipment: None ADL Screening (condition at time of admission) Patient's cognitive ability adequate to safely complete daily activities?: Yes Is the patient deaf or have difficulty hearing?: No Does the patient have difficulty seeing, even when wearing glasses/contacts?: No Does the patient have difficulty concentrating, remembering, or making decisions?: No Patient able to express need for assistance with ADLs?: Yes Does the patient have difficulty dressing or bathing?: No Independently performs ADLs?: Yes (appropriate for developmental age) Communication: Independent Does the patient have difficulty walking or climbing stairs?: No Weakness of Legs: None Weakness of Arms/Hands: None  Permission Sought/Granted                  Emotional Assessment              Admission diagnosis:  Elevated troponin [R77.8] Acute on chronic diastolic CHF (congestive heart failure) (HCC) [I50.33] Acute on chronic congestive heart failure, unspecified heart failure type (HCC) [I50.9] Patient Active Problem List   Diagnosis Date Noted   Acute on chronic diastolic CHF (congestive heart failure) (HCC) 09/15/2021   COPD (chronic obstructive pulmonary disease) (HCC) 09/15/2021   Elevated troponin 09/15/2021   Abnormal LFTs 09/15/2021   Polysubstance abuse (HCC) 09/15/2021    Acid reflux 08/27/2021   Nasal congestion 08/27/2021   History of panic attacks 05/29/2021   Ascending aorta dilatation (HCC) 05/29/2021   Shortness of breath 08/15/2020   Encounter to establish care 07/11/2020   Abdominal pain 07/11/2020   HTN (hypertension) 12/30/2017   Tobacco use 12/30/2017   Substance use disorder 12/30/2017   CHF (congestive heart failure) (HCC) 12/18/2017   PCP:  Rolm Gala, NP Pharmacy:   [id number] [place] - [address] AT [facility] OF [address] [place] [address] Phone: [phone] Fax: [phone]  [facility] [address] [place] [address] Phone: [phone] Fax: [phone]     Social Determinants of Health (SDOH) Interventions    Readmission Risk Interventions     View : No data to display.

## 2021-09-17 DIAGNOSIS — E440 Moderate protein-calorie malnutrition: Secondary | ICD-10-CM | POA: Insufficient documentation

## 2021-09-17 MED ORDER — ADULT MULTIVITAMIN W/MINERALS CH
1.0000 | ORAL_TABLET | Freq: Every day | ORAL | 0 refills | Status: DC
Start: 2021-09-18 — End: 2021-10-07

## 2021-09-17 MED ORDER — ENSURE ENLIVE PO LIQD: 237.0000 mL | Freq: Two times a day (BID) | ORAL | 12 refills | Status: DC

## 2021-09-17 MED ORDER — FUROSEMIDE 20 MG PO TABS: ORAL_TABLET | ORAL | 0 refills | Status: DC

## 2021-09-17 MED ORDER — NICOTINE 21 MG/24HR TD PT24
21.0000 mg | MEDICATED_PATCH | Freq: Every day | TRANSDERMAL | 0 refills | Status: DC
Start: 2021-09-18 — End: 2021-10-07

## 2021-09-17 NOTE — Progress Notes (Signed)
AVS reviewed with patient and wife.  Pt acknowledged understanding.  Discharged to home,

## 2021-09-17 NOTE — Discharge Instructions (Addendum)
Abstain from drinking alcohol and using cocaine and other street drugs Take your home  medications as directed

## 2021-09-17 NOTE — Discharge Summary (Signed)
Physician Discharge Summary   Patient: Darius [name] MRN: [id number] DOB: 1970/11/10  Admit date:     09/15/2021  Discharge date: 09/17/21  Discharge Physician: Enedina Finner   PCP: Rolm Gala, NP   Recommendations at discharge:    Abstain from drinking alcohol and using cocaine and other street drugs Take your home  medications as directed  Discharge Diagnoses: acute on chronic congestive heart failure, systolic  Hospital Course:  Darius [name] is a 51 y.o. male with medical history significant of dCHF, hypertension, diet-controlled diabetes, COPD, GERD, polysubstance abuse (Alcohol, alcohol, cocaine, marijuana), remote seizure, panic attack, ascending aorta dilation, who presents with shortness of breath.   Acute on chronic now systolic CHF (congestive heart failure) (HCC) Patient has bilateral ankle edema, elevated BNP 1826, shortness of breath, positive JVD, crackles on auscultation, clinically consistent with CHF exacerbation.  2D echo on 12/19/2017 showed EF 60-65%.  -Lasix 40 mg bid by IV--change to po lasix -Daily weights -strict I/O's -Low salt diet -Fluid restriction -patient advised to stop drinking alcohol and abstain from drugs. He is also recommended to comply with his medication intake --echo showed EF 40-45% Bright peckled myocardium ,consider infiltrative process. Consider Amyloid/Sarcoidosis  Decrease in LV and RV function. --UOP ~~2 liters. On RA --pt follows at [facility] ch[facility]. Spoke with Clarisa [facility]red to follow patient closely consider cardiology eval if need be Patient has seen [facility] MD cardiology in 2019.  COPD (chronic obstructive pulmonary disease) (HCC) no wheezing or rhonchi on auscultation. -Bronchodilators   Polysubstance abuse (HCC) Polysubstance abuse including tobacco abuse, alcohol abuse, cocaine abuse, marijuana use -Did counseling about importance of quitting substance abuse -CIWA protocol for alcohol abuse--no s/o WD -Nicotine patch for  tobacco abuse   Abnormal LFTs Patient has mild abnormal liver function, likely due to liver congestion secondary to CHF exacerbation and ETOH use -Avoid using Tylenol   Elevated troponin Troponin level 102, denies chest pain, likely demand ischemia in the setting of cocaine use. -Aspirin -denies any chest pain. No acute EKG changes   HTN (hypertension) -PRN iv Hydralazine -lisinopril   CHF education done.  Discuss with wife at bedside.  d/c home        Consultants: none Procedures performed: none  Disposition: Home Diet recommendation:  Discharge Diet Orders (From admission, onward)     Start     Ordered   09/17/21 0000  Diet - low sodium heart healthy        09/17/21 1029           Cardiac diet DISCHARGE MEDICATION: Allergies as of 09/17/2021       Reactions   Avelox [moxifloxacin] Other (See Comments)   Thoracic Aneurysm.   Ciprofloxacin Other (See Comments)   Has thoracic Aneurysm   Penicillins Other (See Comments)   Patient states that mother is highly allergic. 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 Has patient had a PCN reaction causing immediate rash, facial/tongue/throat swelling, SOB or lightheadedness with hypotension: No Has patient had a PCN reaction causing severe rash involving mucus membranes or skin necrosis: No Has patient had a PCN reaction that required hospitalization Yes Has patient had a PCN reaction occurring within the last 10 years: No If all of the above answers are "N        Medication List     STOP taking these medications    HYDROcodone-acetaminophen 5-325 MG tablet Commonly known as: NORCO/VICODIN       TAKE these medications  albuterol 108 (90 Base) MCG/ACT inhaler Commonly known as: Proventil HFA Inhale 2 puffs into the lungs once every 6 (six) hours as needed for wheezing or shortness of breath.   aspirin EC 81 MG tablet Take 81 mg by mouth daily. Swallow whole.   feeding supplement  Liqd Take 237 mLs by mouth 2 (two) times daily between meals.   fluticasone 50 MCG/ACT nasal spray Commonly known as: FLONASE Place 1 spray into both nostrils once daily.   furosemide 20 MG tablet Commonly known as: LASIX Take 40 mg starting 09/18/21 for 2 days and then take 20 mg from Saturday 09/21/21 What changed:  how much to take how to take this additional instructions   lisinopril 2.5 MG tablet Commonly known as: ZESTRIL Take 2.5 mg by mouth daily.   multivitamin with minerals Tabs tablet Take 1 tablet by mouth daily. Start taking on: Sep 18, 2021   nicotine 21 mg/24hr patch Commonly known as: NICODERM CQ - dosed in mg/24 hours Place 1 patch (21 mg total) onto the skin daily. Start taking on: Sep 18, 2021   omeprazole 20 MG capsule Commonly known as: PRILOSEC Take 1 capsule (20 mg total) by mouth once daily.        Follow-up Information     Iloabachie, Chioma E, NP. Schedule an appointment as soon as possible for a visit in 1 week(s).   Specialty: Gerontology Contact information: [address] [place] [address] [phone]         [facility]. Go on 09/25/2021.   Specialty: Cardiology Why: next wednesday Contact information: [address] [place] [address] [phone]               Discharge Exam: Ceasar Mons Weights   09/15/21 0802 09/16/21 0500 09/17/21 0337  Weight: 70.3 kg 67.4 kg 65.1 kg     Condition at discharge: fair  The results of significant diagnostics from this hospitalization (including imaging, microbiology, ancillary and laboratory) are listed below for reference.   Imaging Studies: DG Chest 2 View  Result Date: 09/15/2021 CLINICAL DATA:  51 year old male with history of worsening shortness of breath since yesterday evening. Dry cough. EXAM: CHEST - 2 VIEW COMPARISON:  Chest x-ray 04/22/2021. FINDINGS: Lung volumes are normal. No  consolidative airspace disease. No pleural effusions. No pneumothorax. No evidence of pulmonary edema. Mild cardiomegaly. Upper mediastinal contours are within normal limits. Atherosclerotic calcifications in the thoracic aorta. IMPRESSION: 1. Cardiomegaly. 2. Aortic atherosclerosis. Electronically Signed   By: Trudie Reed M.D.   On: 09/15/2021 08:43   ECHOCARDIOGRAM COMPLETE  Result Date: 09/16/2021    ECHOCARDIOGRAM REPORT   Patient Name:   Darius [name] Date of Exam: 09/15/2021 Medical Rec #:  [id number]    Height:       74.0 in Accession #:    [id number]   Weight:       155.0 lb Date of Birth:  [birth year]    BSA:          1.950 m Patient Age:    51 years     BP:           145/73 mmHg Patient Gender: M            HR:           80 bpm. Exam Location:  [facility] Procedure: 2D Echo Indications:     CHF- Acute Diastolic I50.31  History:  Patient has prior history of Echocardiogram examinations, most                  recent 12/19/2017.  Sonographer:     Overton Mam RDCS Referring Phys:  [id number] Brien Few NIU Diagnosing Phys: Alwyn Pea MD IMPRESSIONS  1. Bicuspid Aortic valve, No stenosis , Mild-Mod AI.  2. Bright peckled myocardium ,consider infiltrative process. Consider Amyloid/Sarcoidosis.  3. Left ventricular ejection fraction, by estimation, is 40 to 45%. The left ventricle has mildly decreased function. The left ventricle demonstrates global hypokinesis. The left ventricular internal cavity size was moderately to severely dilated. There  is mild concentric left ventricular hypertrophy. Left ventricular diastolic parameters are consistent with Grade III diastolic dysfunction (restrictive).  4. Right ventricular systolic function is mildly reduced. The right ventricular size is mildly enlarged. Mildly increased right ventricular wall thickness.  5. Left atrial size was mildly dilated.  6. Right atrial size was mildly dilated.  7. The mitral valve is normal in structure. Trivial mitral valve  regurgitation.  8. The aortic valve is bicuspid. Aortic valve regurgitation is mild to moderate. Aortic valve sclerosis is present, with no evidence of aortic valve stenosis. FINDINGS  Left Ventricle: Left ventricular ejection fraction, by estimation, is 40 to 45%. The left ventricle has mildly decreased function. The left ventricle demonstrates global hypokinesis. The left ventricular internal cavity size was moderately to severely dilated. There is mild concentric left ventricular hypertrophy. Left ventricular diastolic parameters are consistent with Grade III diastolic dysfunction (restrictive). Right Ventricle: The right ventricular size is mildly enlarged. Mildly increased right ventricular wall thickness. Right ventricular systolic function is mildly reduced. Left Atrium: Left atrial size was mildly dilated. Right Atrium: Right atrial size was mildly dilated. Pericardium: There is no evidence of pericardial effusion. Mitral Valve: The mitral valve is normal in structure. Trivial mitral valve regurgitation. Tricuspid Valve: The tricuspid valve is normal in structure. Tricuspid valve regurgitation is mild. Aortic Valve: The aortic valve is bicuspid. Aortic valve regurgitation is mild to moderate. Aortic regurgitation PHT measures 412 msec. Aortic valve sclerosis is present, with no evidence of aortic valve stenosis. Aortic valve mean gradient measures 10.7  mmHg. Aortic valve peak gradient measures 20.9 mmHg. Aortic valve area, by VTI measures 2.27 cm. Pulmonic Valve: The pulmonic valve was normal in structure. Pulmonic valve regurgitation is not visualized. Aorta: The ascending aorta was not well visualized. IAS/Shunts: No atrial level shunt detected by color flow Doppler. Additional Comments: Bright peckled myocardium ,consider infiltrative process. Consider Amyloid/Sarcoidosis. Bicuspid Aortic valve, No stenosis , Mild-Mod AI.  LEFT VENTRICLE PLAX 2D LVIDd:         6.40 cm      Diastology LVIDs:         5.10  cm      LV e' medial:    6.85 cm/s LV PW:         1.30 cm      LV E/e' medial:  14.1 LV IVS:        1.40 cm      LV e' lateral:   10.30 cm/s LVOT diam:     2.50 cm      LV E/e' lateral: 9.4 LV SV:         96 LV SV Index:   49 LVOT Area:     4.91 cm  LV Volumes (MOD) LV vol d, MOD A2C: 284.0 ml LV vol d, MOD A4C: 279.0 ml LV vol s, MOD A2C: 141.0 ml LV vol  s, MOD A4C: 137.0 ml LV SV MOD A2C:     143.0 ml LV SV MOD A4C:     279.0 ml LV SV MOD BP:      145.1 ml RIGHT VENTRICLE RV Basal diam:  4.70 cm RV S prime:     14.70 cm/s TAPSE (M-mode): 2.5 cm LEFT ATRIUM              Index        RIGHT ATRIUM           Index LA diam:        4.20 cm  2.15 cm/m   RA Area:     21.30 cm LA Vol (A2C):   129.0 ml 66.14 ml/m  RA Volume:   65.30 ml  33.48 ml/m LA Vol (A4C):   95.2 ml  48.81 ml/m LA Biplane Vol: 113.0 ml 57.94 ml/m  AORTIC VALVE                     PULMONIC VALVE AV Area (Vmax):    2.36 cm      PV Vmax:       0.64 m/s AV Area (Vmean):   2.28 cm      PV Peak grad:  1.7 mmHg AV Area (VTI):     2.27 cm AV Vmax:           228.50 cm/s AV Vmean:          150.000 cm/s AV VTI:            0.422 m AV Peak Grad:      20.9 mmHg     PULMONARY ARTERY AV Mean Grad:      10.7 mmHg     MPA diam:        3.60 cm LVOT Vmax:         110.00 cm/s LVOT Vmean:        69.800 cm/s LVOT VTI:          0.195 m LVOT/AV VTI ratio: 0.46 AI PHT:            412 msec  AORTA Ao Root diam: 4.00 cm Ao Asc diam:  3.80 cm MITRAL VALVE               TRICUSPID VALVE MV Area (PHT): 4.74 cm    TV Peak grad:   34.2 mmHg MV Decel Time: 160 msec    TV Vmax:        2.92 m/s MV E velocity: 96.40 cm/s MV A velocity: 35.60 cm/s  SHUNTS MV E/A ratio:  2.71        Systemic VTI:  0.20 m                            Systemic Diam: 2.50 cm Alwyn Pea MD Electronically signed by Alwyn Pea MD Signature Date/Time: 09/16/2021/4:54:50 PM    Final     Microbiology: Results for orders placed or performed during the hospital encounter of 09/15/21  Resp Panel  by RT-PCR (Flu A&B, Covid) Nasopharyngeal Swab     Status: None   Collection Time: 09/15/21  8:17 AM   Specimen: Nasopharyngeal Swab; Nasopharyngeal(NP) swabs in vial transport medium  Result Value Ref Range Status   SARS Coronavirus 2 by RT PCR NEGATIVE NEGATIVE Final    Comment: (NOTE) SARS-CoV-2 target nucleic acids are NOT DETECTED.  The SARS-CoV-2 RNA is generally detectable in  upper respiratory specimens during the acute phase of infection. The lowest concentration of SARS-CoV-2 viral copies this assay can detect is 138 copies/mL. A negative result does not preclude SARS-Cov-2 infection and should not be used as the sole basis for treatment or other patient management decisions. A negative result may occur with  improper specimen collection/handling, submission of specimen other than nasopharyngeal swab, presence of viral mutation(s) within the areas targeted by this assay, and inadequate number of viral copies(<138 copies/mL). A negative result must be combined with clinical observations, patient history, and epidemiological information. The expected result is Negative.  Fact Sheet for Patients:  BloggerCourse.com  Fact Sheet for Healthcare Providers:  SeriousBroker.it  This test is no t yet approved or cleared by the [place] FDA and  has been authorized for detection and/or diagnosis of SARS-CoV-2 by FDA under an Emergency Use Authorization (EUA). This EUA will remain  in effect (meaning this test can be used) for the duration of the COVID-19 declaration under Section 564(b)(1) of the Act, 21 U.S.C.section 360bbb-3(b)(1), unless the authorization is terminated  or revoked sooner.       Influenza A by PCR NEGATIVE NEGATIVE Final   Influenza B by PCR NEGATIVE NEGATIVE Final    Comment: (NOTE) The Xpert Xpress SARS-CoV-2/FLU/RSV plus assay is intended as an aid in the diagnosis of influenza from Nasopharyngeal swab  specimens and should not be used as a sole basis for treatment. Nasal washings and aspirates are unacceptable for Xpert Xpress SARS-CoV-2/FLU/RSV testing.  Fact Sheet for Patients: BloggerCourse.com  Fact Sheet for Healthcare Providers: SeriousBroker.it  This test is not yet approved or cleared by the [place] FDA and has been authorized for detection and/or diagnosis of SARS-CoV-2 by FDA under an Emergency Use Authorization (EUA). This EUA will remain in effect (meaning this test can be used) for the duration of the COVID-19 declaration under Section 564(b)(1) of the Act, 21 U.S.C. section 360bbb-3(b)(1), unless the authorization is terminated or revoked.  Performed at [facility], [address] [place], [place] [address]     Labs: CBC: Recent Labs  Lab 09/15/21 0817  WBC 4.1  NEUTROABS 2.8  HGB 12.7*  HCT 38.4*  MCV 93.2  PLT 174   Basic Metabolic Panel: Recent Labs  Lab 09/15/21 0817 09/16/21 0326  NA 140 142  K 3.7 3.8  CL 107 105  CO2 25 28  GLUCOSE 152* 86  BUN 28* 25*  CREATININE 1.21 1.26*  CALCIUM 8.7* 9.5  MG  --  2.1   Liver Function Tests: Recent Labs  Lab 09/15/21 0817  AST 77*  ALT 93*  ALKPHOS 60  BILITOT 0.9  PROT 6.3*  ALBUMIN 3.9   CBG: No results for input(s): GLUCAP in the last 168 hours.  Discharge time spent: greater than 30 minutes.  Signed: Enedina Finner, MD [facility] 09/17/2021

## 2021-09-17 NOTE — Progress Notes (Addendum)
   Heart Failure Nurse Navigator Note  Met with patient and his wife who was at the bedside.  By teach back method went over how he is going to take care of himself at home daily.  Needed little little reinforcement in discussing daily weights, fluid restriction, sodium restriction and changes in symptoms.  Discussed his diet prior to admission, he admitted to eating 6 to 7 pieces of bacon every morning with his eggs.  Made him aware that that much bacon contained 980 mg of sodium which was half of his daily allotment.  Patient made aware that the Ensure he is drinking does contain sodium and also that should be added into his fluid restriction of 64 ounces.  They voiced understanding.  Also discussed his tobacco abuse and how it affects his acid reflux.  Stressed recording daily weights and bringing the chart to his heart failure clinic appointment.  Along with using zone magnet.  They had no further questions.  Pricilla Riffle RN CHFN

## 2021-09-25 ENCOUNTER — Telehealth: Payer: Self-pay | Admitting: Family

## 2021-09-25 ENCOUNTER — Ambulatory Visit: Payer: Self-pay | Admitting: Family

## 2021-09-25 NOTE — Progress Notes (Deleted)
Patient ID: Darius [name], male    DOB: 20-May-1970, 51 y.o.   MRN: [id number]  HPI  Darius [name] is a 51 y/o male with a history of HTN, HF, current tobacco, alcohol and drug use.   Echo report from 09/15/21 reviewed and showed an EF of 40-45% along with mild LVH/LAE and trivial Darius. Echo report from 12/19/17 reviewed and showed an EF of 60-65% along with mild Darius and mildly elevated PA pressure of 44 mm Hg.   Admitted 09/15/21 due to                                      Discharged after 2 days. Was in the ED 04/22/21 due to abdominal pain and diarrhea. Stool sample collected and he was released after evaluation was negative.   He presents today for a follow-up visit with a chief complaint of   Past Medical History:  Diagnosis Date   CHF (congestive heart failure) ([place])    a.) TTE on 12/19/2017 --> LVEF 60-65%, no RWMAs, mild LA dilitation, PASP 44 mmHg   COPD (chronic obstructive pulmonary disease) ([facility])    DOE (dyspnea on exertion)    Glaucoma    History of 2019 novel coronavirus disease (COVID-19) 09/15/2020   Hypertension    Moderate concentric left ventricular hypertrophy 12/19/2017   Nephrolithiasis    Polysubstance abuse ([place])    ETOH, marijuana, cocaine   Seizures ([place])    T2DM (type 2 diabetes mellitus) ([place])    a.)  Controlled with diet lifestyle modifications   Valvular heart disease    a.) TTE on 12/19/2017 --> mild MV regurgitation; moderate AV regurgitation   Past Surgical History:  Procedure Laterality Date   CYSTOSCOPY/URETEROSCOPY/HOLMIUM LASER/STENT PLACEMENT Right 01/29/2021   Procedure: CYSTOSCOPY/URETEROSCOPY/HOLMIUM LASER/STENT PLACEMENT;  Surgeon: Abbie Sons, MD;  Location: [facility] ORS;  Service: Urology;  Laterality: Right;   NO PAST SURGERIES     Family History  Problem Relation Age of Onset   Hypertension Mother    Kidney disease Mother    Diabetes Mother    Mitral valve prolapse Mother    Diabetes Father    Hypertension Maternal Grandmother    Mitral  valve prolapse Maternal Grandmother    Other Half-Brother        accident   Social History   Tobacco Use   Smoking status: Former    Packs/day: 0.25    Years: 35.00    Pack years: 8.75    Types: Cigarettes    Quit date: 08/2020    Years since quitting: 1.0   Smokeless tobacco: Never   Tobacco comments:    Quit smoking cigarettes 1/23' but still using vape/e-cig  Substance Use Topics   Alcohol use: Yes    Alcohol/week: 2.0 standard drinks    Types: 2 Cans of beer per week   Allergies  Allergen Reactions   Avelox [Moxifloxacin] Other (See Comments)    Thoracic Aneurysm.   Ciprofloxacin Other (See Comments)    Has thoracic Aneurysm   Penicillins Other (See Comments)    Patient states that mother is highly allergic. 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  Has patient had a PCN reaction causing immediate rash, facial/tongue/throat swelling, SOB or lightheadedness with hypotension: No Has patient had a PCN reaction causing severe rash involving mucus membranes or skin necrosis: No Has patient had a PCN reaction that required  hospitalization Yes Has patient had a PCN reaction occurring within the last 10 years: No If all of the above answers are "N   Prior to Admission medications   Medication Sig Start Date End Date Taking? Authorizing Provider  albuterol (PROVENTIL HFA) 108 (90 Base) MCG/ACT inhaler Inhale 2 puffs into the lungs once every 6 (six) hours as needed for wheezing or shortness of breath. 08/27/21  Yes Iloabachie, Chioma E, NP  aspirin EC 81 MG tablet Take 81 mg by mouth daily. Swallow whole.   Yes [provider]  furosemide (LASIX) 20 MG tablet Take 20 mg by mouth.   Yes [provider]  HYDROcodone-acetaminophen (NORCO/VICODIN) 5-325 MG tablet Take 1 tablet by mouth every 6 (six) hours as needed for moderate pain. 01/29/21  Yes Stoioff, Ronda Fairly, MD  omeprazole (PRILOSEC) 20 MG capsule Take 1 capsule (20 mg total) by mouth once  daily. 08/27/21  Yes Iloabachie, Chioma E, NP  fluticasone (FLONASE) 50 MCG/ACT nasal spray Place 1 spray into both nostrils once daily. Patient not taking: Reported on 09/06/2021 08/27/21   Caryl Asp E, NP    Review of Systems  Constitutional:  Positive for appetite change (decreased due to reflux) and fatigue.  HENT:  Negative for congestion, postnasal drip and sore throat.   Eyes: Negative.   Respiratory:  Positive for shortness of breath. Negative for apnea and cough.   Cardiovascular:  Positive for chest pain (at times). Negative for palpitations and leg swelling.  Gastrointestinal:  Negative for abdominal distention and abdominal pain.       + reflux/ burning sensation   Endocrine: Negative.   Genitourinary:  Negative for difficulty urinating and dysuria.  Musculoskeletal:  Negative for back pain and neck pain.  Skin: Negative.   Allergic/Immunologic: Negative.   Neurological:  Negative for dizziness and light-headedness.  Hematological:  Negative for adenopathy. Does not bruise/bleed easily.  Psychiatric/Behavioral:  Positive for sleep disturbance (due to reflux). Negative for dysphoric mood. The patient is nervous/anxious.    There were no vitals filed for this visit.  Wt Readings from Last 3 Encounters:  09/17/21 143 lb 9.6 oz (65.1 kg)  09/06/21 155 lb (70.3 kg)  08/05/21 156 lb 9.6 oz (71 kg)   Lab Results  Component Value Date   CREATININE 1.26 (H) 09/16/2021   CREATININE 1.21 09/15/2021   CREATININE 1.24 04/22/2021   Physical Exam Vitals and nursing note reviewed.  Constitutional:      Appearance: Normal appearance. He is well-developed.  HENT:     Head: Normocephalic and atraumatic.  Cardiovascular:     Rate and Rhythm: Normal rate and regular rhythm.  Pulmonary:     Effort: Pulmonary effort is normal. No respiratory distress.     Breath sounds: No wheezing, rhonchi or rales.  Abdominal:     General: Abdomen is flat. There is no distension.      Palpations: Abdomen is soft.     Tenderness: There is no abdominal tenderness.  Musculoskeletal:        General: No tenderness.     Cervical back: Normal range of motion and neck supple.     Right lower leg: No edema.     Left lower leg: No edema.  Skin:    General: Skin is warm and dry.  Neurological:     General: No focal deficit present.     Mental Status: He is alert and oriented to person, place, and time.  Psychiatric:  Mood and Affect: Mood is anxious.        Behavior: Behavior normal.        Thought Content: Thought content normal.   Assessment & Plan:  1: Chronic heart failure with reduced ejection fraction- - NYHA class II - euvolemic today - weighing daily & he was reminded to call for an overnight weight gain of >2 pounds or a weekly weight gain of >5 pounds - weight down 6 pounds from last visit here 2 months ago  - not adding salt to his food & reminded to closely follow a low sodium diet - BNP 12/18/17 was 842.0  2: HTN- - BP elevated (148/86) - saw PCP at [facility] 08/27/21 - BMP on 04/22/21 reviewed and showed sodium 141, potassium 4.1, creatinine 1.24 and GFR >60 - will resume lisinopril 2.5mg  daily; he says that he has plenty of this at home - check BMP next visit  3: Tobacco use- - no longer smoking/ vaping - continued cessation discussed  4: Aneurysm of ascending aorta without rupture- - saw cardiothoracic 08/05/21; returns in 6 months - chest CT on 04/22/21 showed 4.0 cm ascending thoracic aorta  5: Anxiety- - patient admits to feeling anxious and having panic attacks which he knows makes his shortness of breath worse - encouraged him to discuss this with his PCP  6: GERD- - currently on omeprazole which he says helps but doesn't last - instructed him to contact his PCP regarding this.   Medication bottles reviewed.   Return in 1 month, sooner if needed.

## 2021-09-25 NOTE — Telephone Encounter (Signed)
I called and confirmed patients appointment on 5/29 for 5/31 and patient explained that he had no other form of transportation and promises to be here if we schedule a taxi for him. 5/31 taxi service showed up to pick up patient who then cancelled last second causing the clinic to be charged as it was not cancelled previously. Patient also had taxi service call us to cancel his appointment with [place] as his phone is now not working and could not cancel the appointment. Patient will have to find his own rides in the future for future appointments.   Kennidi Yoshida, NT

## 2021-09-26 ENCOUNTER — Ambulatory Visit: Payer: Self-pay | Admitting: Gerontology

## 2021-10-04 ENCOUNTER — Telehealth: Payer: Self-pay | Admitting: Family

## 2021-10-04 ENCOUNTER — Other Ambulatory Visit: Payer: Self-pay

## 2021-10-04 ENCOUNTER — Ambulatory Visit: Payer: Self-pay | Admitting: Family

## 2021-10-04 ENCOUNTER — Other Ambulatory Visit: Payer: Self-pay | Admitting: Gerontology

## 2021-10-04 DIAGNOSIS — I5032 Chronic diastolic (congestive) heart failure: Secondary | ICD-10-CM

## 2021-10-04 DIAGNOSIS — R0981 Nasal congestion: Secondary | ICD-10-CM

## 2021-10-04 MED ORDER — LISINOPRIL 2.5 MG PO TABS: 2.5000 mg | ORAL_TABLET | Freq: Every day | ORAL | 0 refills | Status: DC

## 2021-10-04 MED ORDER — FUROSEMIDE 20 MG PO TABS: 20.0000 mg | ORAL_TABLET | Freq: Every day | ORAL | 0 refills | Status: DC

## 2021-10-04 NOTE — Telephone Encounter (Signed)
Patient called requesting his medication. Explained that he did not keep his last appointment post discharge after we had arranged transportation. Explained that I could not continue to prescribe medication if I am not actively seeing him. Did give him an appointment with me in 3 days. Will send in 4 days of medication until then.   His echo during his recent admission showed Bright peckled myocardium ,consider infiltrative process. Consider Amyloid/Sarcoidosis. Bicuspid Aortic valve, No stenosis, Mild-Mod AI.   Have scheduled cardiology f/u for 10/10/21.   Called patient and wife and neither answered so voicemails were left for both emphasizing that he needed to keep the appointments for next week.

## 2021-10-07 ENCOUNTER — Encounter: Payer: Self-pay | Admitting: Family

## 2021-10-07 ENCOUNTER — Other Ambulatory Visit: Payer: Self-pay

## 2021-10-07 ENCOUNTER — Ambulatory Visit: Payer: Medicaid Other | Attending: Family | Admitting: Family

## 2021-10-07 VITALS — BP 143/75 | HR 84 | Resp 18 | Ht 73.0 in | Wt 157.0 lb

## 2021-10-07 DIAGNOSIS — Z87891 Personal history of nicotine dependence: Secondary | ICD-10-CM | POA: Insufficient documentation

## 2021-10-07 DIAGNOSIS — I5022 Chronic systolic (congestive) heart failure: Secondary | ICD-10-CM | POA: Diagnosis not present

## 2021-10-07 DIAGNOSIS — Z79899 Other long term (current) drug therapy: Secondary | ICD-10-CM | POA: Insufficient documentation

## 2021-10-07 DIAGNOSIS — F419 Anxiety disorder, unspecified: Secondary | ICD-10-CM | POA: Insufficient documentation

## 2021-10-07 DIAGNOSIS — I10 Essential (primary) hypertension: Secondary | ICD-10-CM

## 2021-10-07 DIAGNOSIS — I7121 Aneurysm of the ascending aorta, without rupture: Secondary | ICD-10-CM | POA: Diagnosis not present

## 2021-10-07 DIAGNOSIS — I110 Hypertensive heart disease with heart failure: Secondary | ICD-10-CM | POA: Diagnosis not present

## 2021-10-07 DIAGNOSIS — Z8249 Family history of ischemic heart disease and other diseases of the circulatory system: Secondary | ICD-10-CM | POA: Diagnosis not present

## 2021-10-07 DIAGNOSIS — Z720 Tobacco use: Secondary | ICD-10-CM

## 2021-10-07 MED ORDER — FUROSEMIDE 20 MG PO TABS
20.0000 mg | ORAL_TABLET | Freq: Every day | ORAL | 3 refills | Status: DC
  Filled 2021-10-07: qty 90, 90d supply, fill #0

## 2021-10-07 MED ORDER — LISINOPRIL 2.5 MG PO TABS
2.5000 mg | ORAL_TABLET | Freq: Every day | ORAL | 3 refills | Status: DC
  Filled 2021-10-07: qty 90, 90d supply, fill #0

## 2021-10-07 NOTE — Progress Notes (Signed)
[facility] - HEART FAILURE CLINIC - PHARMACIST COUNSELING NOTE  Guideline-Directed Medical Therapy/Evidence Based Medicine  ACE/ARB/ARNI: Lisinopril 2.5 mg daily Beta Blocker: None Aldosterone Antagonist: None Diuretic: Furosemide 20 mg daily SGLT2i: None  Adherence Assessment  Do you ever forget to take your medication? [] Yes [x] No  Do you ever skip doses due to side effects? [] Yes [x] No  Do you have trouble affording your medicines? [] Yes [x] No  Are you ever unable to pick up your medication due to transportation difficulties? [x] Yes [] No  Do you ever stop taking your medications because you don't believe they are helping? [] Yes [x] No  Do you check your weight daily? [] Yes [x] No   Adherence strategy: takes at same time each day  Barriers to obtaining medications:  transportation- utilizes [facility] taxi system for appointments Medication Management for pharmacy services  Vital signs: HR 84, BP 143/75, weight (pounds) 157 lb ECHO: Date 09/15/2021, EF 40-45%, notes: mildly decreased function and global hypokinesis of left ventricle     Latest Ref Rng & Units 09/16/2021    3:26 AM 09/15/2021    8:17 AM 04/22/2021    9:49 AM  BMP  Glucose 70 - 99 mg/dL 86   99   BUN 6 - 20 mg/dL 25  28  15    Creatinine 0.61 - 1.24 mg/dL     Sodium 135 - 145 mmol/L 142  140  141   Potassium 3.5 - 5.1 mmol/L 3.8  3.7  4.1   Chloride 98 - 111 mmol/L 105  107  108   CO2 22 - 32 mmol/L 28  25  27    Calcium 8.9 - 10.3 mg/dL 9.5  8.7  9.2     Past Medical History:  Diagnosis Date   CHF (congestive heart failure) ([facility])    a.) TTE on 12/19/2017 --> LVEF 60-65%, no RWMAs, mild LA dilitation, PASP 44 mmHg   COPD (chronic obstructive pulmonary disease) ([facility])    DOE (dyspnea on exertion)    Glaucoma    History of 2019 novel coronavirus disease (COVID-19) 09/15/2020   Hypertension    Moderate concentric left ventricular hypertrophy 12/19/2017    Nephrolithiasis    Polysubstance abuse ([facility])    ETOH, marijuana, cocaine   Seizures ([facility])    T2DM (type 2 diabetes mellitus) ([facility])    a.)  Controlled with diet lifestyle modifications   Valvular heart disease    a.) TTE on 12/19/2017 --> mild MV regurgitation; moderate AV regurgitation    ASSESSMENT 51 year old male who presents to the HF clinic for management of HFpEF. Presented today after cancellation last week. Reports doing well overall. Needs refills of medications. Checks blood pressure at home, and weighs at home noting no fluctuations recently.    PLAN Reconciled medications Advised patient to bring medications at future visits  Time spent: 15 minutes  09/17/2021, PharmD Pharmacy Resident  10/07/2021 11:39 AM  Current Outpatient Medications:    albuterol (PROVENTIL HFA) 108 (90 Base) MCG/ACT inhaler, Inhale 2 puffs into the lungs once every 6 (six) hours as needed for wheezing or shortness of breath., Disp: 6.7 g, Rfl: 3   aspirin EC 81 MG tablet, Take 81 mg by mouth daily. Swallow whole., Disp: , Rfl:    feeding supplement (ENSURE ENLIVE / ENSURE PLUS) LIQD, Take 237 mLs by mouth 2 (two) times daily between meals., Disp: 237 mL, Rfl: 12   furosemide (LASIX) 20 MG tablet, Take 1 tablet (20 mg total)  by mouth daily., Disp: 90 tablet, Rfl: 3   lisinopril (ZESTRIL) 2.5 MG tablet, Take 1 tablet (2.5 mg total) by mouth daily., Disp: 90 tablet, Rfl: 3   omeprazole (PRILOSEC) 20 MG capsule, Take 1 capsule (20 mg total) by mouth once daily., Disp: 30 capsule, Rfl: 0   COUNSELING POINTS/CLINICAL PEARLS  DRUGS TO CAUTION IN HEART FAILURE  Drug or Class Mechanism  Analgesics NSAIDs COX-2 inhibitors Glucocorticoids  Sodium and water retention, increased systemic vascular resistance, decreased response to diuretics   Diabetes Medications Metformin Thiazolidinediones Rosiglitazone (Avandia) Pioglitazone (Actos) DPP4 Inhibitors Saxagliptin (Onglyza) Sitagliptin  (Januvia)   Lactic acidosis Possible calcium channel blockade   Unknown  Antiarrhythmics Class I  Flecainide Disopyramide Class III Sotalol Other Dronedarone  Negative inotrope, proarrhythmic   Proarrhythmic, beta blockade  Negative inotrope  Antihypertensives Alpha Blockers Doxazosin Calcium Channel Blockers Diltiazem Verapamil Nifedipine Central Alpha Adrenergics Moxonidine Peripheral Vasodilators Minoxidil  Increases renin and aldosterone  Negative inotrope    Possible sympathetic withdrawal  Unknown  Anti-infective Itraconazole Amphotericin B  Negative inotrope Unknown  Hematologic Anagrelide Cilostazol   Possible inhibition of PD IV Inhibition of PD III causing arrhythmias  Neurologic/Psychiatric Stimulants Anti-Seizure Drugs Carbamazepine Pregabalin Antidepressants Tricyclics Citalopram Parkinsons Bromocriptine Pergolide Pramipexole Antipsychotics Clozapine Antimigraine Ergotamine Methysergide Appetite suppressants Bipolar Lithium  Peripheral alpha and beta agonist activity  Negative inotrope and chronotrope Calcium channel blockade  Negative inotrope, proarrhythmic Dose-dependent QT prolongation  Excessive serotonin activity/valvular damage Excessive serotonin activity/valvular damage Unknown  IgE mediated hypersensitivy, calcium channel blockade  Excessive serotonin activity/valvular damage Excessive serotonin activity/valvular damage Valvular damage  Direct myofibrillar degeneration, adrenergic stimulation  Antimalarials Chloroquine Hydroxychloroquine Intracellular inhibition of lysosomal enzymes  Urologic Agents Alpha Blockers Doxazosin Prazosin Tamsulosin Terazosin  Increased renin and aldosterone  Adapted from Page Williemae Natter, et al. "Drugs That May Cause or Exacerbate Heart Failure: A Scientific Statement from the American Heart  Association." Circulation 2016; 134:e32-e69. DOI:  10.1161/CIR.0000000000000426   MEDICATION ADHERENCES TIPS AND STRATEGIES Taking medication as prescribed improves patient outcomes in heart failure (reduces hospitalizations, improves symptoms, increases survival) Side effects of medications can be managed by decreasing doses, switching agents, stopping drugs, or adding additional therapy. Please let someone in the Heart Failure Clinic know if you have having bothersome side effects so we can modify your regimen. Do not alter your medication regimen without talking to [place].  Medication reminders can help patients remember to take drugs on time. If you are missing or forgetting doses you can try linking behaviors, using pill boxes, or an electronic reminder like an alarm on your phone or an app. Some people can also get automated phone calls as medication reminders.

## 2021-10-07 NOTE — Patient Instructions (Signed)
Continue weighing daily and call for an overnight weight gain of 3 pounds or more or a weekly weight gain of more than 5 pounds.   If you have voicemail, please make sure your mailbox is cleaned out so that we may leave a message and please make sure to listen to any voicemails.     

## 2021-10-07 NOTE — Progress Notes (Signed)
Patient ID: Darius [name], male    DOB: 1970/10/01, 51 y.o.   MRN: [id number]  HPI  Darius [name] is a 51 y/o male with a history of HTN, HF, current tobacco, alcohol and drug use.   Echo report from 09/15/21 reviewed and showed an EF of 40-45% along with mild LVH/LAE and trivial Darius. Bright peckled myocardium ,consider infiltrative process. Consider Amyloid/Sarcoidosis.Echo report from 12/19/17 reviewed and showed an EF of 60-65% along with mild Darius and mildly elevated PA pressure of 44 mm Hg.   Admitted 09/15/21 due to SOB due to acute on chronic HF. Initially given IV lasix with transition to oral diuretic. Elevated troponin thought to be due to demand ischemia. Discharged after 2 days. Was in the ED 04/22/21 due to abdominal pain and diarrhea. Stool sample collected and he was released after evaluation was negative.   He presents today with a chief complaint of a follow-up visit. He currently has no symptoms and specifically denies any difficulty sleeping, dizziness, abdominal distention, palpitations, pedal edema, chest pain, shortness of breath, cough, fatigue or weight gain.   Took his last dose of furosemide 2 days ago and needs new prescriptions for this and his lisinopril. Overall says that he feels great and is hoping to get off all his medications if possible.    Past Medical History:  Diagnosis Date   CHF (congestive heart failure) (HCC)    a.) TTE on 12/19/2017 --> LVEF 60-65%, no RWMAs, mild LA dilitation, PASP 44 mmHg   COPD (chronic obstructive pulmonary disease) (HCC)    DOE (dyspnea on exertion)    Glaucoma    History of 2019 novel coronavirus disease (COVID-19) 09/15/2020   Hypertension    Moderate concentric left ventricular hypertrophy 12/19/2017   Nephrolithiasis    Polysubstance abuse (HCC)    ETOH, marijuana, cocaine   Seizures (HCC)    T2DM (type 2 diabetes mellitus) (HCC)    a.)  Controlled with diet lifestyle modifications   Valvular heart disease    a.) TTE on  12/19/2017 --> mild MV regurgitation; moderate AV regurgitation   Past Surgical History:  Procedure Laterality Date   CYSTOSCOPY/URETEROSCOPY/HOLMIUM LASER/STENT PLACEMENT Right 01/29/2021   Procedure: CYSTOSCOPY/URETEROSCOPY/HOLMIUM LASER/STENT PLACEMENT;  Surgeon: Darius Altes, [name];  Location: [facility] ORS;  Service: Urology;  Laterality: Right;   NO PAST SURGERIES     Family History  Problem Relation Age of Onset   Hypertension Mother    Kidney disease Mother    Diabetes Mother    Mitral valve prolapse Mother    Diabetes Father    Hypertension Maternal Grandmother    Mitral valve prolapse Maternal Grandmother    Other Half-Brother        accident   Social History   Tobacco Use   Smoking status: Former    Packs/day: 0.25    Years: 35.00    Total pack years: 8.75    Types: Cigarettes    Quit date: 08/2020    Years since quitting: 1.1   Smokeless tobacco: Never   Tobacco comments:    Quit smoking cigarettes 1/23' but still using vape/e-cig  Substance Use Topics   Alcohol use: Yes    Alcohol/week: 2.0 standard drinks of alcohol    Types: 2 Cans of beer per week   Allergies  Allergen Reactions   Avelox [Moxifloxacin] Other (See Comments)    Thoracic Aneurysm.   Ciprofloxacin Other (See Comments)    Has thoracic Aneurysm   Penicillins Other (See Comments)  Patient states that mother is highly allergic. 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  Has patient had a PCN reaction causing immediate rash, facial/tongue/throat swelling, SOB or lightheadedness with hypotension: No Has patient had a PCN reaction causing severe rash involving mucus membranes or skin necrosis: No Has patient had a PCN reaction that required hospitalization Yes Has patient had a PCN reaction occurring within the last 10 years: No If all of the above answers are "N   Prior to Admission medications   Medication Sig Start Date End Date Taking? Authorizing Provider  albuterol  (PROVENTIL HFA) 108 (90 Base) MCG/ACT inhaler Inhale 2 puffs into the lungs once every 6 (six) hours as needed for wheezing or shortness of breath. 08/27/21  Yes Darius [name]  aspirin EC 81 MG tablet Take 81 mg by mouth daily. Swallow whole.   Yes Provider, Historical, [name]  feeding supplement (ENSURE ENLIVE / ENSURE PLUS) LIQD Take 237 mLs by mouth 2 (two) times daily between meals. 09/17/21  Yes Darius [name]  lisinopril (ZESTRIL) 2.5 MG tablet Take 1 tablet (2.5 mg total) by mouth daily. 10/07/21  Yes Darius [name]  omeprazole (PRILOSEC) 20 MG capsule Take 1 capsule (20 mg total) by mouth once daily. 08/27/21  Yes Darius [name]  furosemide (LASIX) 20 MG tablet Take 1 tablet (20 mg total) by mouth daily. 10/07/21   Darius [name]    Review of Systems  Constitutional:  Negative for appetite change and fatigue.  HENT:  Negative for congestion, postnasal drip and sore throat.   Eyes: Negative.   Respiratory:  Negative for apnea, cough and shortness of breath.   Cardiovascular:  Negative for chest pain, palpitations and leg swelling.  Gastrointestinal:  Negative for abdominal distention and abdominal pain.       + reflux/ burning sensation   Endocrine: Negative.   Genitourinary:  Negative for difficulty urinating and dysuria.  Musculoskeletal:  Negative for back pain and neck pain.  Skin: Negative.   Allergic/Immunologic: Negative.   Neurological:  Negative for dizziness, weakness and light-headedness.  Hematological:  Negative for adenopathy. Does not bruise/bleed easily.  Psychiatric/Behavioral:  Negative for dysphoric mood and sleep disturbance (sleeping on 2 pillows). The patient is nervous/anxious.    Vitals:   10/07/21 1000  BP: (!) 143/75  Pulse: 84  Resp: 18  SpO2: 100%  Weight: 157 lb (71.2 kg)  Height: 6\' 1"  (1.854 m)   Wt Readings from Last 3 Encounters:  10/07/21 157 lb (71.2 kg)  09/17/21 143 lb 9.6 oz (65.1 kg)  09/06/21 155 lb (70.3 kg)    Lab Results  Component Value Date   CREATININE 1.26 (H) 09/16/2021   CREATININE 1.21 09/15/2021   CREATININE 1.24 04/22/2021   Physical Exam Vitals and nursing note reviewed.  Constitutional:      Appearance: Normal appearance. He is well-developed.  HENT:     Head: Normocephalic and atraumatic.  Cardiovascular:     Rate and Rhythm: Normal rate and regular rhythm.  Pulmonary:     Effort: Pulmonary effort is normal. No respiratory distress.     Breath sounds: No wheezing, rhonchi or rales.  Abdominal:     General: Abdomen is flat. There is no distension.     Palpations: Abdomen is soft.     Tenderness: There is no abdominal tenderness.  Musculoskeletal:        General: No tenderness.     Cervical back: Normal  range of motion and neck supple.     Right lower leg: No edema.     Left lower leg: No edema.  Skin:    General: Skin is warm and dry.  Neurological:     General: No focal deficit present.     Mental Status: He is alert and oriented to person, place, and time.  Psychiatric:        Mood and Affect: Mood normal.        Behavior: Behavior normal.        Thought Content: Thought content normal.    Assessment & Plan:  1: Chronic heart failure with now reduced ejection fraction- - NYHA class I - euvolemic today - weighing daily & he was reminded to call for an overnight weight gain of >2 pounds or a weekly weight gain of >5 pounds - weight up 2 pounds from last visit here 1 month ago  - not adding salt to his food & reminded to closely follow a low sodium diet - reviewed recent echo which showed possible amyloid/sarcoid so [facility] cardiology appt scheduled for 10/10/21; emphasized that he keep this appointment as he may need further work-up at the [facility] in [place] - says that he would need transportation to [place] as he's currently using an electric bike to get to local appointments; he prefers to go see [facility] cardiology first and then go to the [facility] if needed - on GDMT of  lisinopril; explained GDMT and recommendation of all 4 groups of GMDT; he prefers to wait until it's established about the amyloid  - PharmD reconciled medications with the patient - lisinopril and furosemide RX sent to [facility] - BNP 09/15/21 was 1826.5  2: HTN- - BP mildly elevated (143/75) but he's been out of his furosemide - saw PCP at [facility] 08/27/21 - BMP on 09/16/21 reviewed and showed sodium 142, potassium 3.8, creatinine 1.26 and GFR >60  3: Tobacco use- - no longer smoking/ vaping - no further drug use since recent admission - continued cessation discussed  4: Aneurysm of ascending aorta without rupture- - saw cardiothoracic 08/05/21; returns in 6 months - chest CT on 04/22/21 showed 4.0 cm ascending thoracic aorta  5: Anxiety- - patient says that he and his wife are flying to [place] the end of this month to celebrate their wedding anniversary and he's never flown before - says that he's very anxious thinking about flying - sees PCP this week and encouraged him to discuss this with her   Medication list reviewed.   Return in September, sooner if needed or pending above amyloid work-up.

## 2021-10-08 ENCOUNTER — Other Ambulatory Visit: Payer: Self-pay

## 2021-10-08 MED FILL — Fluticasone Propionate Nasal Susp 50 MCG/ACT: NASAL | 60 days supply | Qty: 16 | Fill #0 | Status: CN

## 2021-10-09 ENCOUNTER — Ambulatory Visit: Payer: Self-pay | Admitting: Gerontology

## 2021-10-10 ENCOUNTER — Encounter: Payer: Self-pay | Admitting: Cardiology

## 2021-10-10 ENCOUNTER — Other Ambulatory Visit: Payer: Self-pay

## 2021-10-10 ENCOUNTER — Ambulatory Visit (INDEPENDENT_AMBULATORY_CARE_PROVIDER_SITE_OTHER): Payer: Self-pay | Admitting: Cardiology

## 2021-10-10 ENCOUNTER — Other Ambulatory Visit
Admission: RE | Admit: 2021-10-10 | Discharge: 2021-10-10 | Disposition: A | Discharge status: Home / Self Care | Payer: Medicaid Other | Attending: Cardiology | Admitting: Cardiology

## 2021-10-10 VITALS — BP 120/64 | HR 75 | Ht 74.0 in | Wt 154.1 lb

## 2021-10-10 DIAGNOSIS — Q231 Congenital insufficiency of aortic valve: Secondary | ICD-10-CM | POA: Insufficient documentation

## 2021-10-10 DIAGNOSIS — F1910 Other psychoactive substance abuse, uncomplicated: Secondary | ICD-10-CM

## 2021-10-10 DIAGNOSIS — I10 Essential (primary) hypertension: Secondary | ICD-10-CM

## 2021-10-10 DIAGNOSIS — I428 Other cardiomyopathies: Secondary | ICD-10-CM | POA: Insufficient documentation

## 2021-10-10 DIAGNOSIS — Z720 Tobacco use: Secondary | ICD-10-CM

## 2021-10-10 DIAGNOSIS — I77810 Thoracic aortic ectasia: Secondary | ICD-10-CM

## 2021-10-10 DIAGNOSIS — I5042 Chronic combined systolic (congestive) and diastolic (congestive) heart failure: Secondary | ICD-10-CM

## 2021-10-10 DIAGNOSIS — J449 Chronic obstructive pulmonary disease, unspecified: Secondary | ICD-10-CM

## 2021-10-10 HISTORY — DX: Congenital insufficiency of aortic valve: Q23.1

## 2021-10-10 LAB — CBC
HCT: 42.1 % (ref 39.0–52.0)
Hemoglobin: 14.2 g/dL (ref 13.0–17.0)
MCH: 30.9 pg (ref 26.0–34.0)
MCHC: 33.7 g/dL (ref 30.0–36.0)
MCV: 91.5 fL (ref 80.0–100.0)
Platelets: 223 10*3/uL (ref 150–400)
RBC: 4.6 MIL/uL (ref 4.22–5.81)
RDW: 12.1 % (ref 11.5–15.5)
WBC: 4.2 10*3/uL (ref 4.0–10.5)
nRBC: 0 % (ref 0.0–0.2)

## 2021-10-10 MED ORDER — FUROSEMIDE 20 MG PO TABS
ORAL_TABLET | ORAL | 3 refills | Status: DC
  Filled 2021-10-10: qty 110, fill #0

## 2021-10-10 NOTE — Progress Notes (Unsigned)
Primary Care Provider: Rolm Gala, NP [facility] Cardiologist: None Heart Failure Cardiology: Clarisa Kindred, FNP Electrophysiologist: None  Clinic Note: Chief Complaint  Patient presents with   New Patient (Initial Visit)    Patient is referred for an evaluation of chronic HFmrEF At the request of Iloabachie, Chioma E, NP. Patient c/o shortness of breath. Medications reviewed by the patient verbally.     ===================================  ASSESSMENT/PLAN   Problem List Items Addressed This Visit       Cardiology Problems   Essential hypertension - Primary (Chronic)   Ascending aorta dilatation (HCC) (Chronic)   Valvular cardiomyopathy (HCC) (Chronic)   Aortic insufficiency due to bicuspid aortic valve (Chronic)     Other   Tobacco use   COPD (chronic obstructive pulmonary disease) (HCC) (Chronic)   Polysubstance abuse (HCC) (Chronic)   ===================================  HPI:    Darius [name] is a 51 y.o. male with documented medical history of HTN, chronic HFpEF, COPD as well as tobacco, alcohol and polysubstance (marijuana which she says is occasionally laced with "powder "-meaning cocaine) abuse who is being seen today for the evaluation of PROGRESSION OF CHF WITH NOW REDUCED EF-an ECHO REPORT SPECKLED PATTERN CONCERN FOR INFILTRATIVE DISEASE at the request of Iloabachie, Chioma E, NP.  --> He has been followed by Clarisa Kindred, FNP in the [facility] dating back to 2019-this was after an admission in August.  His major complaint was fatigue on minimal exertion with dyspnea and feeling totally tired.  This been going on for several months but he said that medications are making him feel tired.  There was suggestion of chest pain at that time but no PND or orthopnea.  He apparently did relatively well including having COVID in May 2022 with no issues.  He had not had a further hospitalization until Sep 15, 2021 (reviewed below).  Apparently he had stopped  taking his medications because it made him "spit like a camel ".  Recent Hospitalizations:  09/15/2021: Admitted.   Apparently he had been off of his medications for a month or 2 and had worsening lower extremity edema to the point where he could barely walk with leg pain.  He progressively is worsening exertional dyspnea but really no PND orthopnea.  He just extremely fatigued.  BNP was 1826.Had mild troponin elevation thought to be related to demand ischemia. Treated with IV Lasix as well as bronchodilators. => Echo reviewed and showed drop in EF to 40 to 45% with a "bright speckled pattern in the myocardium consistent with possible infiltrative process consider amyloidosis and sarcoidosis.  Based on this finding, he is referred for cardiology evaluation.  Echocardiogram was read by and on [facility] cardiologist.  [name] was last seen on October 07, 2021 by Clarisa Kindred, FNP in the heart failure clinic following recent hospitalization reportedly related to acute on chronic CHF.  Was doing well with no further symptoms.  Unfortunately had run out of his furosemide and lisinopril. => Felt to be NYHA class I CHF-euvolemic.  Discussed sliding scale Lasix.  Referred to [facility] cardiology.   Reviewed  CV studies:    The following studies were reviewed today: (if available, images/films reviewed: From Epic Chart or Care Everywhere) TTE August 2019:: EF 60 to 65%.  No RWMA normal diastolic parameters.  Mild to moderate aortic valve thickening with with bicuspid appearance.  Mild to moderate AI.  Mild LA dilation.  PAP estimated 44 mmHg. TTE 09/18/2019: EF 40 to 45% with  mildly reduced function and global HK.  Moderate to severely dilated LV with mild concentric LVH.  GR 3 DD noted.  Mildly reduced RV function with increased thickness.  Mild biatrial enlargement.  Aortic valve remains bicuspid with mild to moderate aortic valve sclerosis but no stenosis. I have asked Dr. Lennie Odor to review this echocardiogram.   He felt that the patient's EF is indeed down but that he felt it was more related to progression of aortic valvular disease to the point of having moderate to severe if not severe aortic insufficiency that is eccentric and related to the bicuspid aortic valve.  He does not think that there is evidence of a "speckled pattern to suggest amyloidosis ".  He thinks that this is related to increased gain in the Doppler signal.   Interval History:   Darius [name]   CV Review of Symptoms (Summary) Cardiovascular ROS: {roscv:310661}  REVIEWED OF SYSTEMS   ROS  I have reviewed and (if needed) personally updated the patient's problem list, medications, allergies, past medical and surgical history, social and family history.   PAST MEDICAL HISTORY   Past Medical History:  Diagnosis Date   CHF (congestive heart failure) (HCC)    a.) TTE on 12/19/2017 --> LVEF 60-65%, no RWMAs, mild LA dilitation, PASP 44 mmHg   COPD (chronic obstructive pulmonary disease) (HCC)    DOE (dyspnea on exertion)    Glaucoma    History of 2019 novel coronavirus disease (COVID-19) 09/15/2020   Hypertension    Moderate concentric left ventricular hypertrophy 12/19/2017   Nephrolithiasis    Polysubstance abuse (HCC)    ETOH, marijuana, cocaine   Seizures (HCC)    T2DM (type 2 diabetes mellitus) (HCC)    a.)  Controlled with diet lifestyle modifications   Valvular heart disease    a.) TTE on 12/19/2017 --> mild MV regurgitation; moderate AV regurgitation    PAST SURGICAL HISTORY   Past Surgical History:  Procedure Laterality Date   CYSTOSCOPY/URETEROSCOPY/HOLMIUM LASER/STENT PLACEMENT Right 01/29/2021   Procedure: CYSTOSCOPY/URETEROSCOPY/HOLMIUM LASER/STENT PLACEMENT;  Surgeon: Riki Altes, MD;  Location: [facility] ORS;  Service: Urology;  Laterality: Right;   NO PAST SURGERIES       There is no immunization history on file for this patient.  MEDICATIONS/ALLERGIES   Current Meds  Medication Sig    albuterol (PROVENTIL HFA) 108 (90 Base) MCG/ACT inhaler Inhale 2 puffs into the lungs once every 6 (six) hours as needed for wheezing or shortness of breath.   aspirin EC 81 MG tablet Take 81 mg by mouth daily. Swallow whole.   furosemide (LASIX) 20 MG tablet Take 1 tablet (20 mg total) by mouth daily.   lisinopril (ZESTRIL) 2.5 MG tablet Take 1 tablet (2.5 mg total) by mouth daily.   omeprazole (PRILOSEC) 20 MG capsule Take 1 capsule (20 mg total) by mouth once daily.    Allergies  Allergen Reactions   Avelox [Moxifloxacin] Other (See Comments)    Thoracic Aneurysm.   Ciprofloxacin Other (See Comments)    Has thoracic Aneurysm   Penicillins Other (See Comments)    Patient states that mother is highly allergic. 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  Has patient had a PCN reaction causing immediate rash, facial/tongue/throat swelling, SOB or lightheadedness with hypotension: No Has patient had a PCN reaction causing severe rash involving mucus membranes or skin necrosis: No Has patient had a PCN reaction that required hospitalization Yes Has patient had a PCN reaction occurring  within the last 10 years: No If all of the above answers are "N    SOCIAL HISTORY/FAMILY HISTORY   Reviewed in Epic:   Social History   Tobacco Use   Smoking status: Some Days    Packs/day: 0.25    Years: 35.00    Total pack years: 8.75    Types: Cigarettes    Last attempt to quit: 08/2020    Years since quitting: 1.1   Smokeless tobacco: Never   Tobacco comments:    Smokes 4-5 cigarettes per week.   Vaping Use   Vaping Use: Former   Quit date: 05/12/2021   Substances: Nicotine, Flavoring  Substance Use Topics   Alcohol use: Yes    Alcohol/week: 2.0 standard drinks of alcohol    Types: 2 Cans of beer per week   Drug use: Yes    Types: Marijuana, Other-see comments, "Crack" cocaine    Comment: last use of crack cocaine 01/2021 current use of marijuana   Social History    Social History Narrative   Not on file   Family History  Problem Relation Age of Onset   Hypertension Mother    Kidney disease Mother    Diabetes Mother    Mitral valve prolapse Mother    Diabetes Father    Hypertension Maternal Grandmother    Mitral valve prolapse Maternal Grandmother    Other Half-Brother        accident    OBJCTIVE -PE, EKG, labs   Wt Readings from Last 3 Encounters:  10/10/21 154 lb 2 oz (69.9 kg)  10/07/21 157 lb (71.2 kg)  09/17/21 143 lb 9.6 oz (65.1 kg)    Physical Exam: BP 120/64 (BP Location: Left Arm, Patient Position: Sitting, Cuff Size: Normal)   Pulse 75   Ht 6\' 2"  (1.88 m)   Wt 154 lb 2 oz (69.9 kg)   SpO2 99%   BMI 19.79 kg/m  Physical Exam   Adult ECG Report  Rate: *** ;  Rhythm: {rhythm:17366};   Narrative Interpretation: ***  Recent Labs:  ***  Lab Results  Component Value Date   CHOL 167 09/16/2021   HDL 51 09/16/2021   LDLCALC 102 (H) 09/16/2021   TRIG 71 09/16/2021   CHOLHDL 3.3 09/16/2021   Lab Results  Component Value Date   CREATININE 1.26 (H) 09/16/2021   BUN 25 (H) 09/16/2021   NA 142 09/16/2021   K 3.8 09/16/2021   CL 105 09/16/2021   CO2 28 09/16/2021      Latest Ref Rng & Units 09/15/2021    8:17 AM 04/22/2021    9:49 AM 01/29/2021   11:16 AM  CBC  WBC 4.0 - 10.5 K/uL 4.1  3.5    3.5    Hemoglobin 13.0 - 17.0 g/dL 03/31/2021  82.9    56.2  13.0   Hematocrit 39.0 - 52.0 % 38.4  40.5    40.4  39.0   Platelets 150 - 400 K/uL 174  189    212      Lab Results  Component Value Date   HGBA1C 4.8 09/15/2021   Lab Results  Component Value Date   TSH 0.758 12/18/2017    ==================================================  COVID-19 Education: The signs and symptoms of COVID-19 were discussed with the patient and how to seek care for testing (follow up with PCP or arrange E-visit).    I spent a total of *** minutes with the patient spent in direct patient consultation.  Additional time spent  with  chart review  / charting (studies, outside notes, etc): *** min Total Time: *** min  Current medicines are reviewed at length with the patient today.  (+/- concerns) ***  This visit occurred during the SARS-CoV-2 public health emergency.  Safety protocols were in place, including screening questions prior to the visit, additional usage of staff PPE, and extensive cleaning of exam room while observing appropriate contact time as indicated for disinfecting solutions.  Notice: This dictation was prepared with Dragon dictation along with smart phrase technology. Any transcriptional errors that result from this process are unintentional and may not be corrected upon review.   Studies Ordered:  No orders of the defined types were placed in this encounter.  No orders of the defined types were placed in this encounter.   Patient Instructions / Medication Changes & Studies & Tests Ordered   There are no Patient Instructions on file for this visit.    Bryan Lemma, M.D., M.S. Interventional Cardiologist   Pager # [phone] Phone # [phone] [address] [place], [place] [address]   Thank you for choosing [facility] in [place]!!

## 2021-10-10 NOTE — Patient Instructions (Signed)
Medication Instructions:  Your physician has recommended you make the following change in your medication:   If your weight increases 3 pounds or more overnight, take 40 mg (2 tablets) of lasix that day.   *If you need a refill on your cardiac medications before your next appointment, please call your pharmacy*   Lab Work:  Today: CBC  Medical Mall Entrance at [facility] 1st desk on the right to check in (REGISTRATION)  Lab hours: Monday- Friday (7:30 am- 5:30 pm)   If you have labs (blood work) drawn today and your tests are completely normal, you will receive your results only by: MyChart Message (if you have MyChart) OR A paper copy in the mail If you have any lab test that is abnormal or we need to change your treatment, we will call you to review the results.   Testing/Procedures: You will be contacted to schedule your cardiac MRI.   It will be scheduled at [facility] [address] [place], [place] [address] Proceed to the registration desk upon arrival ?  Magnetic resonance imaging (MRI) is a painless test that produces images of the inside of the body without using X-rays. During an MRI, strong magnets and radio waves work together in a Data processing manager to form detailed images. MRI images may provide more details about a medical condition than X-rays, CT scans, and ultrasounds can provide.  You may be given earphones to listen for instructions.  You may eat a light breakfast and take medications as ordered with the exception of lasix/furosemide (fluid pill, other). If a contrast material will be used, an IV will be inserted into one of your veins. Contrast material will be injected into your IV.  You will be asked to remove all metal, including: Watch, jewelry, and other metal objects including hearing aids, hair pieces and dentures. (Braces and fillings normally are not a problem.)  If contrast material was used:  It will leave your body through your urine  within a day. You may be told to drink plenty of fluids to help flush the contrast material out of your system.  TEST WILL TAKE APPROXIMATELY 1 HOUR  PLEASE NOTIFY SCHEDULING AT LEAST 24 HOURS IN ADVANCE IF YOU ARE UNABLE TO KEEP YOUR APPOINTMENT.      Follow-Up: At [facility], you and your health needs are our priority.  As part of our continuing mission to provide you with exceptional heart care, we have created designated Provider Care Teams.  These Care Teams include your primary Cardiologist (physician) and Advanced Practice Providers (APPs -  Physician Assistants and Nurse Practitioners) who all work together to provide you with the care you need, when you need it.  We recommend signing up for the patient portal called "MyChart".  Sign up information is provided on this After Visit Summary.  MyChart is used to connect with patients for Virtual Visits (Telemedicine).  Patients are able to view lab/test results, encounter notes, upcoming appointments, etc.  Non-urgent messages can be sent to your provider as well.   To learn more about what you can do with MyChart, go to [email or web address].    Your next appointment:   4-6 week(s)  The format for your next appointment:   In Person  Provider:   You may see Bryan Lemma, MD or one of the following Advanced Practice Providers on your designated Care Team:   Nicolasa Ducking, NP Eula Listen, PA-C Cadence Fransico Michael, [place]   Other Instructions N/A  Important Information  About Sugar

## 2021-10-12 ENCOUNTER — Encounter: Payer: Self-pay | Admitting: Cardiology

## 2021-10-12 NOTE — Assessment & Plan Note (Signed)
Can be assessed with cardiac MRI as well.

## 2021-10-12 NOTE — Assessment & Plan Note (Signed)
Not currently on standing medications besides albuterol.  Defer to PCP.Marland Kitchen

## 2021-10-12 NOTE — Assessment & Plan Note (Addendum)
Recent admission with acute on chronic heart failure thought to initially be diastolic, but with reduced EF is now combined HF (truthfully HFmrEF / HFrEF).  He is on low-dose Lasix and low-dose furosemide doing fairly well.  At this point he would need to fully evaluate the etiology for his drop in EF. He has not had any chest pain or pressure to make CAD high likelihood, more concerning his valvular heart disease.  Plan: Cardiac MRI, would likely plan for ischemic evaluation with left heart cath (would also combined with right heart cath for valvular evaluation)  For now continue lisinopril, but if financially capable would want to convert to [facility].  Clearly not able to use beta-blocker given his recent admission to cocaine use  Continue current dose of Lasix, we discussed sliding scale dosing for weight gain.  Follow-up visit, would likely consider titrating up ACE inhibitor and potentially adding spironolactone.

## 2021-10-12 NOTE — Assessment & Plan Note (Signed)
Blood pressure looks pretty good today on low-dose lisinopril. Due to financial restraints, he is on lisinopril as opposed to [place]. No beta-blocker because of just recent cocaine use.

## 2021-10-12 NOTE — Assessment & Plan Note (Signed)
The echocardiogram done during recent hospitalization did not mention valve stenosis or regurgitation just that it was bicuspid.  I personally reviewed the images and felt that there was indeed evidence of aortic insufficiency and have asked one of my noninvasive colleagues (Dr. Lennie Odor) to review the echo and he truly felt that there was aortic insufficiency.  He recommended quantification of aortic efficiency with cardiac MRI which will also allow [place] to exclude infiltrative process.  Plan: Cardiac MRI

## 2021-10-12 NOTE — Assessment & Plan Note (Signed)
Echocardiogram and recent hospitalization showed reduced EF of 40 to 45% with global HK, moderate-severely dilated LV with mild concentric LVH.  This is a significant change from 2019.  Perhaps the most concerning feature is bicuspid aortic valve with likely moderate to severe AI per Dr. Lennie Odor.  With reduced EF, he would likely require formal ischemic evaluation, but for now would also like to reevaluate EF, aortic valve insufficiency as well as for potential infiltrative process with cardiac MRI.  Based on that result, would potentially need to proceed with ischemic evaluation.  Plan: Cardiac MRI-plan to evaluate for infiltrative disease as well as quantify aortic insufficiency.  (As well as bicuspid aortic valve stenosis)

## 2021-10-12 NOTE — Assessment & Plan Note (Signed)
Definitely a problem.  He seems to be doing pretty well with the smoking cessation down to 5 cigarettes a week.  He also states not drinking as much alcohol.  Unfortunately he does still use marijuana which according to him has sometimes been laced with cocaine.  He stated that he was smoking he had no knowledge that the marijuana he recently smoked have been laced. I explained to him that regardless of whether he knew about or not, he should not be even smoking the marijuana.  As such, he needs a Foley abstain from drugs because he cannot be sure of what he is getting.  With his now reduced EF and worsening valve there is concerns for potential need for surgical valve replacement, and he would likely be a poor candidate if he has continued substance abuse.

## 2021-10-14 ENCOUNTER — Telehealth: Payer: Self-pay | Admitting: Cardiology

## 2021-10-14 NOTE — Telephone Encounter (Signed)
Patient called to arrange transportation to go appointment at [facility] on 6/21 at 8:00am.  Please call him back.

## 2021-10-15 ENCOUNTER — Ambulatory Visit: Payer: Self-pay | Admitting: Gerontology

## 2021-10-15 ENCOUNTER — Telehealth: Payer: Self-pay | Admitting: Cardiology

## 2021-10-15 ENCOUNTER — Telehealth (HOSPITAL_COMMUNITY): Payer: Self-pay | Admitting: *Deleted

## 2021-10-15 NOTE — Telephone Encounter (Signed)
Attempted to call the patient. No answer- I left a message to please call back.  

## 2021-10-15 NOTE — Telephone Encounter (Signed)
Secure chat received this afternoon from Larey Brick, Nurse Navigator for the Cardiac CT/ MRI programs. She advised she was on the line with this patient and he advised he was claustraphobic going in the MRI scanner.  The patient had not advised me of this earlier today when we spoke about his transportation.  He also did not report this to nursing at his OV that I am aware of last week.   I reached out to Dr. Herbie Baltimore and he agreed to send in a low dose Valium 5 mg x 1 dose prior to his Cardiac MRI.  I called and notified the patient of the above. He confirms he has no way to get to the pharmacy today. He uses [facility], but they will not be open prior to his scheduled appointment in the AM.   I have offered to reschedule his Cardiac MRI, but the patient declined stating he most definitely wants to keep his appointment to find out what is going with his heart.   The patient is aware that the imaging team will proceed as scheduled, but if he has any issues in the scanner, his test may need to be rescheduled when he can pick up a valium RX to take prior.  The patient voices understanding and is agreeable.  I have notified Merle of the above as well.

## 2021-10-15 NOTE — Telephone Encounter (Signed)
Pt returning nurses call regarding transportation. Pt states that he uses a Conservation officer, [employer] but would like to make sure that they arrive early due to him having to turn them down in the past for being late. Pt would also like to discuss another matter with nurse as well. Please advise

## 2021-10-15 NOTE — Telephone Encounter (Signed)
Reaching out to patient to offer assistance regarding upcoming cardiac imaging study; pt verbalizes understanding of appt date/time, and where to check in; name and call back number provided for further questions should they arise  Larey Brick RN Navigator Cardiac Imaging [facility] [phone] office [phone] cell  Patient states he may have a pellet in him but is unsure. He does report claustrophobia and transportation issues.  Reached out the office to see about medications for MRI and transportation.

## 2021-10-15 NOTE — Telephone Encounter (Signed)
Issue # 1: I called and spoke with Darius [name]. I have advised him that Darius [name], front desk rep, is assisting with his taxi voucher for tomorrow.  She will be in touch with him later today to confirm that voucher fax has been sent in.   Issue # 2: I then inquired what additional questions/ concerns that he has. He stated that he has applied for Medicaid a week ago and wanted to know if he needed to apply for disability as well.  I advised Darius [name] that we do not coordinate disability or Medicaid for patients so I was unclear on how to advise him at this time. I inquired if he has a case worker that is helping him with his Medicaid and he advised that his wife helped him apply.   I then inquired if he has a PCP- he confirmed he sees someone at [facility]. I have advised him to please call [facility] as they may have a social worker who can advise him on disability.   Issue # 3: As I was speaking with Darius [name], there would be a lull in our conversation where Darius [name] would not speak. He advised, "I keep fading in and out." I asked him to clarify what this means and per Darius [name], he advised that he is just really tired Darius last couple of days. He relates this to his medications. However, he is only on lasix 20 mg once daily & lisinopril 2.5 mg once daily. Dr. Herbie Baltimore made no medication changes last week. Darius [name] confirms he has been taking his medications consistently Darius last 2 weeks. He does not check his BP/ HR at home. He states he does not have much of an appetite.  He denies syncope/ pre-syncope.  I have advised Darius [name] that I cannot confirm if his symptoms are medication related or not, and with no vital signs, it is hard for me to advise on Darius cause of why he is feeling this way. I have asked him to please keep his appointment for his cardiac MRI tomorrow- they should check his BP at that time. I have asked him to please call me back  tomorrow if his SBP is < 100. In Darius interim, he is advised to call 911 if he experiences symptoms of syncope/ pre-syncope.  Darius [name] voices understanding.

## 2021-10-16 ENCOUNTER — Ambulatory Visit: Admission: RE | Admit: 2021-10-16 | Payer: Self-pay | Source: Ambulatory Visit

## 2021-10-17 NOTE — Telephone Encounter (Signed)
Noted- all efforts were made to get this patient to his appointment on 10/16/21 for his Cardiac MRI.  He confirmed with me on 10/15/21 that he was definitely going to get this Cardiac MRI completed, but no showed for his appointment.   He has no showed for several appointments in the [facility] system.

## 2021-10-17 NOTE — Telephone Encounter (Signed)
6/20 LATE ENTRY Spoke with patient and confirmed pick up time 7 am for testing .  Patient confirmed appt  .  Per Epic no show for testing .

## 2021-10-31 ENCOUNTER — Telehealth (HOSPITAL_COMMUNITY): Payer: Self-pay | Admitting: Licensed Clinical Social Worker

## 2021-10-31 NOTE — Telephone Encounter (Signed)
CSW received consult to follow up with pt about housing concerns as well as other reported SDOH issues.  CSW attempted to call pt to discuss- unable to reach- left VM requesting return call  Burna Sis, LCSW Clinical Social Worker [facility] Desk#: [phone] Cell#: [phone]

## 2021-10-31 NOTE — Progress Notes (Signed)
Heart and Vascular Care Navigation  10/31/2021  Darius [name] March 12, 1971 [id number]  Reason for Referral: SDOH concerns    Engaged with patient by telephone for initial visit for Heart and Vascular Care Coordination.                                                                                                   Assessment:    CSW called pt to assess current SDOH concerns and possible solutions.    Pt reports that he has been having financial concerns over past 6 months which was the last time he has stable job.  Currently living in a boarding home- has been in boarding homes for 9 years since separation from his wife who lives in [place].  Has worked some small jobs since losing job 6 months ago (was fired) but has mainly depended on his mom and wife for financial assistance.  States that his plan for income moving forward is applying for disability but he has not started this process yet.   Has applied for Medicaid but reports issues getting a hold of SSA.  CSW provided pt with [place] direct number as the national line often has long wait times.   Pt gets food stamps and no reported issues obtaining food at this time.  Pt does report issues with transportation.  Has an electric bike but no other way to get to appts.  Pt does state he has ride to appt on 7/14 procedure through his wife as he was told he needed a responsible party to take him home after.    Pt main concerns at this time is remaining at boarding house with no source of income- states it has become too much for his mom and wife to assist with.  CSW explained that there are no options for long term payers to help with $500 rent each month and that all assistance programs are for short term crisis needs.  Because patient has no source of income and does not plan to return to work and instead pursue disability which will take minimum 6-9 months CSW encouraged him to start thinking of alternative options like moving in with mom or wife  temporarily- he will speak with them about this.                               HRT/VAS Care Coordination     Patients Home Cardiology Office [address] [facility]   Living arrangements for the past 2 months Boarding House   Lives with: Roommate   Patient Current Insurance Coverage Self-Pay; Medicaid Pending   Patient Has Concern With Paying Medical Bills Yes   Does Patient Have Prescription Coverage? No   Patient Prescription Assistance Programs Other   Other Assistance Programs Medications [facility]ication [facility] enrollment in [place], [place]   Home Assistive Devices/Equipment None       Social History:  SDOH Screenings   Alcohol Screen: Low Risk  (11/21/2020)   Alcohol Screen    Last Alcohol Screening Score (AUDIT): 1  Depression (PHQ2-9): Low Risk  (07/12/2021)   Depression (PHQ2-9)    PHQ-2 Score: 1  Financial Resource Strain: High Risk (10/31/2021)   Overall Financial Resource Strain (CARDIA)    Difficulty of Paying Living Expenses: Very hard  Food Insecurity: No Food Insecurity (10/31/2021)   Hunger Vital Sign    Worried About Running Out of Food in the Last Year: Never true    Ran Out of Food in the Last Year: Never true  Housing: Medium Risk (10/31/2021)   Housing    Last Housing Risk Score: 1  Physical Activity: Unknown (03/01/2018)   Exercise Vital Sign    Days of Exercise per Week: 7 days    Minutes of Exercise per Session: Patient refused  Social Connections: Moderately Isolated (03/01/2018)   Social Connection and Isolation Panel [NHANES]    Frequency of Communication with Friends and Family: Never    Frequency of Social Gatherings with Friends and Family: Never    Attends Religious Services: Never    Database administrator or Organizations: No    Attends Banker Meetings: Never    Marital Status: Married  Stress: Stress Concern Present (03/01/2018)   [employer] of Occupational Health  - Occupational Stress Questionnaire    Feeling of Stress : Very much  Tobacco Use: High Risk (10/12/2021)   Patient History    Smoking Tobacco Use: Some Days    Smokeless Tobacco Use: Never    Passive Exposure: Not on file  Transportation Needs: Unmet Transportation Needs (10/31/2021)   PRAPARE - Administrator, Civil Service (Medical): Yes    Lack of Transportation (Non-Medical): No    SDOH Interventions: Financial Resources:  Financial Strain Interventions: Other (Comment) (applied for medicaid, applying for disabilit) Social Security for Medical illustrator Insecurity:   Gets about $239/month in food stamps  Housing Insecurity:  Housing Interventions: Other (Comment) can't afford boarding house rates  Transportation:   Has electric bike but difficult to get places when needed given heat and weather barriers    Follow-up plan:    Pt to start disability with SSA.  Pt will reach out regarding transportation assistance if needed in the future.  Will continue to follow and assist as needed  Burna Sis, LCSW Clinical Social Worker [facility] Desk#: [phone] Cell#: [phone]

## 2021-11-07 ENCOUNTER — Telehealth (HOSPITAL_COMMUNITY): Payer: Self-pay | Admitting: *Deleted

## 2021-11-07 ENCOUNTER — Ambulatory Visit: Payer: Self-pay | Admitting: Cardiology

## 2021-11-07 NOTE — Telephone Encounter (Signed)
Attempted to call patient regarding upcoming cardiac MRI appointment. No answer and unable to leave a voicemail.  Mattox Schorr RN Navigator Cardiac Imaging [facility] [phone] Office [phone] Cell  

## 2021-11-08 ENCOUNTER — Encounter: Payer: Self-pay | Admitting: Cardiology

## 2021-11-08 ENCOUNTER — Other Ambulatory Visit: Payer: Self-pay

## 2021-11-08 ENCOUNTER — Ambulatory Visit
Admission: RE | Admit: 2021-11-08 | Discharge: 2021-11-08 | Disposition: A | Discharge status: Home / Self Care | Payer: Medicaid Other | Source: Ambulatory Visit | Attending: Cardiology | Admitting: Cardiology

## 2021-11-08 ENCOUNTER — Other Ambulatory Visit: Payer: Self-pay | Admitting: Family

## 2021-11-08 ENCOUNTER — Telehealth: Payer: Self-pay | Admitting: Cardiology

## 2021-11-08 ENCOUNTER — Other Ambulatory Visit: Payer: Self-pay | Admitting: Pharmacy Technician

## 2021-11-08 DIAGNOSIS — I428 Other cardiomyopathies: Secondary | ICD-10-CM | POA: Insufficient documentation

## 2021-11-08 DIAGNOSIS — K219 Gastro-esophageal reflux disease without esophagitis: Secondary | ICD-10-CM

## 2021-11-08 DIAGNOSIS — Q231 Congenital insufficiency of aortic valve: Secondary | ICD-10-CM | POA: Diagnosis not present

## 2021-11-08 HISTORY — PX: OTHER SURGICAL HISTORY: SHX169

## 2021-11-08 MED ORDER — FUROSEMIDE 20 MG PO TABS
ORAL_TABLET | ORAL | 3 refills | Status: DC
  Filled 2021-11-08: qty 100, 50d supply, fill #0

## 2021-11-08 MED ORDER — GADOBUTROL 1 MMOL/ML IV SOLN
10.0000 mL | Freq: Once | INTRAVENOUS | Status: AC | PRN
  Administered 2021-11-08: 10 mL via INTRAVENOUS

## 2021-11-08 MED ORDER — DIAZEPAM 5 MG PO TABS: 5.0000 mg | ORAL_TABLET | Freq: Once | ORAL | 0 refills | Status: AC

## 2021-11-08 MED ORDER — OMEPRAZOLE 20 MG PO CPDR
20.0000 mg | DELAYED_RELEASE_CAPSULE | Freq: Every day | ORAL | 0 refills | Status: DC
  Filled 2021-11-08: qty 90, 90d supply, fill #0

## 2021-11-08 NOTE — Progress Notes (Signed)
Ordered per Dr. Elissa Hefty note

## 2021-11-08 NOTE — Telephone Encounter (Signed)
Patient called in very anxious and worked up due to having MRI scheduled for this morning and being claustrophobic.  He mentioned Valium 5 MG Rx was never called in, see 6/20 phone encounter.  I contacted triage for advisement and Leavy Cella, RN is working on having medication approved prior to procedure. Concluded call with patient, in route to [facility].

## 2021-11-08 NOTE — Patient Outreach (Signed)
Patient only signed DOH Attestation.  Would need to provide current year's household income if PAP medications were needed.  Bethene Hankinson J. Brittyn Salaz Patient Advocate Specialist [facility]  

## 2021-11-08 NOTE — Telephone Encounter (Signed)
Looked over pt's chart. Per telephone note from Sherri Rad, RN on 10/15/2021 "I reached out to Dr. Herbie Baltimore and he agreed to send in a low dose Valium 5 mg x 1 dose prior to his Cardiac MRI."  Called central scheduling at 9:27am spoke with Ethelene Browns who transferred me to [facility] MRI. Spoke with Alvino Chapel she states they can not give medication there, only contrast. However they do have tricks that can make the procedure more bearable. "If he is willing to try them we can try." I informed her the pt was already checked in.  9:44am Received a call from scheduling reporting pt was at the pharmacy trying to pick up his prescription but it was not ready. I called [facility] at provided number [phone] to clarify order. Spoke with Morrie Sheldon. She states pt was frustrated that the medication was not ready and he needed to get to the MRI. He stated he was supposed to take it an hour before his MRI. It was too late and he would have to reschedule his test." She took a verbal order for the medication to have on file for the pt when he reschedules his MRI.

## 2021-11-08 NOTE — Telephone Encounter (Signed)
ERROR

## 2021-11-11 ENCOUNTER — Telehealth (HOSPITAL_COMMUNITY): Payer: Self-pay | Admitting: Licensed Clinical Social Worker

## 2021-11-11 NOTE — Telephone Encounter (Signed)
H&V Care Navigation CSW Progress Note  Clinical Social Worker  contacted by hospital staff  to assist with transportation barriers to MRI.  Pt reporting that he is unable to come to [facility] for repeat Cardiac MRI which they would like to schedule for tomorrow.  CSW able to assist with transport to this imaging appt.  Ride set up through [employer] for tomorrow to get pt to Entrance C of the hospital by 12pm.  They will call pt with exact pick up time.  When pt is ready for pick up staff to call [employer] at [phone] to request pick up and return ride home.  SDOH Screenings   Alcohol Screen: Low Risk  (11/21/2020)   Alcohol Screen    Last Alcohol Screening Score (AUDIT): 1  Depression (PHQ2-9): Low Risk  (07/12/2021)   Depression (PHQ2-9)    PHQ-2 Score: 1  Financial Resource Strain: High Risk (10/31/2021)   Overall Financial Resource Strain (CARDIA)    Difficulty of Paying Living Expenses: Very hard  Food Insecurity: No Food Insecurity (10/31/2021)   Hunger Vital Sign    Worried About Running Out of Food in the Last Year: Never true    Ran Out of Food in the Last Year: Never true  Housing: Medium Risk (10/31/2021)   Housing    Last Housing Risk Score: 1  Physical Activity: Unknown (03/01/2018)   Exercise Vital Sign    Days of Exercise per Week: 7 days    Minutes of Exercise per Session: Patient refused  Social Connections: Moderately Isolated (03/01/2018)   Social Connection and Isolation Panel [NHANES]    Frequency of Communication with Friends and Family: Never    Frequency of Social Gatherings with Friends and Family: Never    Attends Religious Services: Never    Database administrator or Organizations: No    Attends Banker Meetings: Never    Marital Status: Married  Stress: Stress Concern Present (03/01/2018)   [employer] of Occupational Health - Occupational Stress Questionnaire    Feeling of Stress : Very much  Tobacco Use: High Risk (10/12/2021)    Patient History    Smoking Tobacco Use: Some Days    Smokeless Tobacco Use: Never    Passive Exposure: Not on file  Transportation Needs: Unmet Transportation Needs (11/11/2021)   PRAPARE - Administrator, Civil Service (Medical): Yes    Lack of Transportation (Non-Medical): No     Burna Sis, LCSW Clinical Social Worker [facility] Desk#: [phone] Cell#: [phone]

## 2021-11-13 ENCOUNTER — Telehealth (HOSPITAL_COMMUNITY): Payer: Self-pay | Admitting: Licensed Clinical Social Worker

## 2021-11-13 NOTE — Telephone Encounter (Signed)
H&V Care Navigation CSW Progress Note  Clinical Social Worker contacted patient by phone to check in.   SDOH Screenings   Alcohol Screen: Low Risk  (11/21/2020)   Alcohol Screen    Last Alcohol Screening Score (AUDIT): 1  Depression (PHQ2-9): Low Risk  (07/12/2021)   Depression (PHQ2-9)    PHQ-2 Score: 1  Financial Resource Strain: High Risk (10/31/2021)   Overall Financial Resource Strain (CARDIA)    Difficulty of Paying Living Expenses: Very hard  Food Insecurity: No Food Insecurity (10/31/2021)   Hunger Vital Sign    Worried About Running Out of Food in the Last Year: Never true    Ran Out of Food in the Last Year: Never true  Housing: Medium Risk (10/31/2021)   Housing    Last Housing Risk Score: 1  Physical Activity: Unknown (03/01/2018)   Exercise Vital Sign    Days of Exercise per Week: 7 days    Minutes of Exercise per Session: Patient refused  Social Connections: Moderately Isolated (03/01/2018)   Social Connection and Isolation Panel [NHANES]    Frequency of Communication with Friends and Family: Never    Frequency of Social Gatherings with Friends and Family: Never    Attends Religious Services: Never    Database administrator or Organizations: No    Attends Banker Meetings: Never    Marital Status: Married  Stress: Stress Concern Present (03/01/2018)   [employer] of Occupational Health - Occupational Stress Questionnaire    Feeling of Stress : Very much  Tobacco Use: High Risk (10/12/2021)   Patient History    Smoking Tobacco Use: Some Days    Smokeless Tobacco Use: Never    Passive Exposure: Not on file  Transportation Needs: Unmet Transportation Needs (11/11/2021)   PRAPARE - Administrator, Civil Service (Medical): Yes    Lack of Transportation (Non-Medical): No    CSW called pt to check in regarding test yesterday and confirm transportation went ok.  Pt reports everything went fine and he completed test successfully.    Pt reports  he still has not applied for disability- CSW reiterated how he needs to initiate that process either online or over the phone since he is unable to go in person- pt will talk to his wife about assisting him apply online or will plan to call SSA to initiate process.  CSW also inquiring about housing status- pt continues to have no way to pay for housing and no plan- acknowledges he might need to move in with his mom.  Will continue to follow and assist as needed  Burna Sis, LCSW Clinical Social Worker [facility] Desk#: [phone] Cell#: [phone]

## 2021-11-15 ENCOUNTER — Encounter: Payer: Self-pay | Admitting: Licensed Clinical Social Worker

## 2021-11-15 NOTE — Progress Notes (Signed)
CSW met with patient in the clinic. Patient shared that he has an upcoming telephone appointment with Social Security on August 17 th. Patient shared that he will need the names and addresses of all of his MD offices for the appointment.   Patient is currently living in a boarding home, with no monthly income and states he is struggling with financial needs. Patient does have food stamps although no other benefits at this time. Patient states he needs transportation to Social Services to access additional benefits at [employer].   CSW provided patient with the list of MD and office addresses to assist with his SSI application in August. CSW also provided patient with contact information for [employer] [phone] a transportation service offered through [employer]. Raquel Sarna, [place], [place]

## 2021-11-21 ENCOUNTER — Ambulatory Visit: Payer: Self-pay | Admitting: Cardiology

## 2021-11-21 NOTE — Congregational Nurse Program (Signed)
  Dept: [phone]   Congregational Nurse Program Note  Date of Encounter: 11/21/2021 Client to clinic today reporting he had to cancel his cardiology apt because [facility] was unable to accommodate. He did not give the needed notice, which he was unaware of. RN assisted client with rescheduling this apt. New apt 9/7. Client then called [facility] and was told that he was unable to use this transportation as he does no have Medicaid. He reports that he has worked with  a Careers information officer" at the Cardiology office. He also reports that he has a telephone interview with Social Security regarding his disability on 8/17.  Per chart note review the SW at the [facility] Cardiology office was sending him documentation to assist with this. RN also contacted Medication Management to be sure all of his paperwork was complete at his request. He does need to sign one more form, which Rn has available for him and will drop off at Medication Management. Client appreciative of assistance provided. Past Medical History: Past Medical History:  Diagnosis Date   Aortic insufficiency due to bicuspid aortic valve 10/10/2021   Chronic HFpEF    a.) TTE on 12/19/2017 --> LVEF 60-65%, no RWMAs, mild LA dilitation, PASP 44 mmHg   COPD (chronic obstructive pulmonary disease) (HCC)    Glaucoma    History of 2019 novel coronavirus disease (COVID-19) 09/15/2020   Hypertension    Moderate concentric left ventricular hypertrophy 12/19/2017   Nephrolithiasis    Polysubstance abuse (HCC)    ETOH, marijuana, cocaine   Seizures (HCC)    T2DM (type 2 diabetes mellitus) (HCC)    a.)  Controlled with diet lifestyle modifications   Valvular heart disease    a.) TTE on 12/19/2017 --> mild MV regurgitation; moderate AV regurgitation    Encounter Details:  CNP Questionnaire - 11/21/21 0900       Questionnaire   Do you give verbal consent to treat you today? Yes    Location Patient Served  [place]    Visit Setting Church or  Organization    Patient Status Unknown   client lives in a boarding house   Insurance Uninsured ([employer]/Self-Pay)   he reports he ahs applied for [employer] Referral N/A   client has applied for Medicaid per his report   Medication N/A    Medical Provider Yes   [facility]   Screening Referrals N/A    Medical Referral N/A    Medical Appointment Made N/A   assisted client with rescheduling his Cardiology apt that was today.   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 *Does need assistance depending on timing of appointments   Housing/Utilities N/A    Interpersonal Safety N/A    Intervention Support;Advocate;Case Management    ED Visit Averted N/A    Life-Saving Intervention Made N/A

## 2021-11-22 ENCOUNTER — Telehealth: Payer: Self-pay | Admitting: Cardiology

## 2021-11-22 NOTE — Telephone Encounter (Signed)
Darius Lex, MD  11/14/2021  9:37 PM EDT     Cardiac MRI shows severe left and right ventricle dysfunction with ejection fraction of roughly 30% per.  Normal range is roughly 50%.   It does show evidence of bicuspid aortic valve with fusion of 2 of the cusps.  Is associated with severe aortic regurgitation.   Nonspecific findings of gadolinium enhancement.  Not unexpected in the setting of elevated pulmonary pressures. => Nothing to suggest abnormal infiltrative process such as amyloidosis.   = Although the question of amyloidosis seems to be negative, the pump function is definitely lower by this study then a 2D echocardiogram suggested.  Also the aortic regurgitation is worse.   At this point I do think we probably need to consider evaluation for valve replacement.   Bryan Lemma, MD

## 2021-11-22 NOTE — Telephone Encounter (Signed)
Patient was scheduled to see Dr. Herbie Baltimore in the office on 11/21/21, but unable to do so due to transportation issue. See phone notes with Case Management.   Discussed with Dr. Herbie Baltimore if he would be ok with the patient having a virtual visit with him on 11/28/21 at 11:40 am. [facility] [place] per MD.   I have called the patient. He has reviewed his results in MyChart and Dr. Elissa Hefty comments. He voices understanding of these results.   I have advised the patient, that due to transportation issues, we can have him meet with Dr. Herbie Baltimore on 11/28/21 by telehealth visit to discuss these results in more detail and a POC going forward.  The patient is agreeable to a telehealth visit with Dr. Herbie Baltimore on 11/28/21 at 11:40 am. He is aware I will send his telehealth consent via his MyChart.  He is asked to review this and respond back "I Agree," if he agrees with the consent as stated.  The patient voice understanding of all of the above and is agreeable.

## 2021-11-28 ENCOUNTER — Encounter: Payer: Self-pay | Admitting: Cardiology

## 2021-11-28 ENCOUNTER — Telehealth: Payer: Self-pay | Admitting: Cardiology

## 2021-11-28 VITALS — Ht 74.0 in | Wt 154.3 lb

## 2021-11-28 NOTE — Progress Notes (Deleted)
{Choose 1 Note Type (Telehealth Visit or Telephone Visit):[phone]}   Patient has given verbal permission to conduct this visit via virtual appointment and to bill insurance 11/28/2021 11:55 AM     Evaluation Performed:  Follow-up visit  Date:  11/28/2021   ID:  Darius [name], DOB 21-May-1970, MRN [id number]  {Patient [phone]::"Home"} {Provider [phone]::"Home Office"}  PCP:  Rolm Gala, NP  Cardiologist:  None *** Electrophysiologist:  None   Chief Complaint:   Chief Complaint  Patient presents with   Discuss abnormal Cardiac MRI    Patient states that he is ok, he stopped taking the acid reflux medication and it makes it difficult for him to breath. The patient states that he stays up all night because when he lays down, he can't breath.  Meds reviewed with patient verbally.    ====================================  ASSESSMENT & PLAN:    Problem List Items Addressed This Visit   None   ====================================  History of Present Illness:    Darius [name] is a 51 y.o. male with PMH notable for *** who presents via audio/video conferencing for a telehealth visit today as a ***.  Govanni Mcwhirt was last seen ***  Hospitalizations:  ***   Recent - Interim CV studies:   The following studies were reviewed today: Cardiac MRI 11/14/2021: 1. Bicuspid aortic valve with fusion of right and noncoronary cusps.  Severe aortic regurgitation (regurgitant fraction 59%)   2.  Severe LV dilatation with moderate systolic dysfunction (EF 31%)   3.  Severe RV dilatation with moderate systolic dysfunction (EF 30%)   4. RV insertion site late gadolinium enhancement, which is a nonspecific finding often seen in setting of elevated pulmonary pressures   5. Dilated ascending aorta measuring 35mm  Inerval History   ***  Cardiovascular ROS: {roscv:[id number]}   ROS:  Please see the history of present illness.     ROS  Past Medical History:   Diagnosis Date   Aortic insufficiency due to bicuspid aortic valve 10/10/2021   Chronic HFpEF    a.) TTE on 12/19/2017 --> LVEF 60-65%, no RWMAs, mild LA dilitation, PASP 44 mmHg   COPD (chronic obstructive pulmonary disease) (HCC)    Glaucoma    History of 2019 novel coronavirus disease (COVID-19) 09/15/2020   Hypertension    Moderate concentric left ventricular hypertrophy 12/19/2017   Nephrolithiasis    Polysubstance abuse (HCC)    ETOH, marijuana, cocaine   Seizures (HCC)    T2DM (type 2 diabetes mellitus) (HCC)    a.)  Controlled with diet lifestyle modifications   Valvular heart disease    a.) TTE on 12/19/2017 --> mild MV regurgitation; moderate AV regurgitation   Past Surgical History:  Procedure Laterality Date   CYSTOSCOPY/URETEROSCOPY/HOLMIUM LASER/STENT PLACEMENT Right 01/29/2021   Procedure: CYSTOSCOPY/URETEROSCOPY/HOLMIUM LASER/STENT PLACEMENT;  Surgeon: Riki Altes, MD;  Location: [facility] ORS;  Service: Urology;  Laterality: Right;   KIDNEY STONE SURGERY     05/2021   NO PAST SURGERIES       Current Meds  Medication Sig   albuterol (PROVENTIL HFA) 108 (90 Base) MCG/ACT inhaler Inhale 2 puffs into the lungs once every 6 (six) hours as needed for wheezing or shortness of breath.   feeding supplement (ENSURE ENLIVE / ENSURE PLUS) LIQD Take 237 mLs by mouth 2 (two) times daily between meals.   fluticasone (FLONASE) 50 MCG/ACT nasal spray Place 1 spray into both nostrils once daily.   furosemide (LASIX) 20 MG tablet Take 20 mg (1 tablet) by  mouth daily. If weight increases 3 or more pounds overnight, take 40 mg (2 tablets)   lisinopril (ZESTRIL) 2.5 MG tablet Take 1 tablet (2.5 mg total) by mouth daily.     Allergies:   Avelox [moxifloxacin], Ciprofloxacin, and Penicillins   Social History   Tobacco Use   Smoking status: Some Days    Packs/day: 0.25    Years: 35.00    Total pack years: 8.75    Types: Cigarettes    Last attempt to quit: 08/2020    Years since  quitting: 1.2   Smokeless tobacco: Never   Tobacco comments:    Smokes 4-5 cigarettes per week.   Vaping Use   Vaping Use: Former   Quit date: 05/12/2021   Substances: Nicotine, Flavoring  Substance Use Topics   Alcohol use: Yes    Alcohol/week: 2.0 standard drinks of alcohol    Types: 2 Cans of beer per week   Drug use: Yes    Types: Marijuana, Other-see comments, "Crack" cocaine    Comment: last use of crack cocaine 01/2021 current use of marijuana     Family Hx: The patient's family history includes Diabetes in his father and mother; Hypertension in his maternal grandmother and mother; Kidney disease in his mother; Mitral valve prolapse in his maternal grandmother and mother; Other in his half-brother.   Labs/Other Tests and Data Reviewed:    EKG:  {OXB:[id number]}  Recent Labs: 09/15/2021: ALT 93; B Natriuretic Peptide 1,826.5 09/16/2021: BUN 25; Creatinine, Ser 1.26; Magnesium 2.1; Potassium 3.8; Sodium 142 10/10/2021: Hemoglobin 14.2; Platelets 223   Recent Lipid Panel Lab Results  Component Value Date/Time   CHOL 167 09/16/2021 03:26 AM   CHOL 144 07/18/2020 11:28 AM   TRIG 71 09/16/2021 03:26 AM   HDL 51 09/16/2021 03:26 AM   HDL 52 07/18/2020 11:28 AM   CHOLHDL 3.3 09/16/2021 03:26 AM   LDLCALC 102 (H) 09/16/2021 03:26 AM   LDLCALC 73 07/18/2020 11:28 AM    Wt Readings from Last 3 Encounters:  11/28/21 154 lb 4.8 oz (70 kg)  10/10/21 154 lb 2 oz (69.9 kg)  10/07/21 157 lb (71.2 kg)     Objective:    Vital Signs:  Ht 6\' 2"  (1.88 m)   Wt 154 lb 4.8 oz (70 kg)   BMI 19.81 kg/m   {HeartCare Virtual Exam (Optional):[phone]::"VITAL SIGNS:  reviewed"}   ==========================================  COVID-19 Education: The signs and symptoms of COVID-19 were discussed with the patient and how to seek care for testing (follow up with PCP or arrange E-visit).   The importance of social distancing was discussed today.  Time:   Today, I have spent ***  minutes with the patient with telehealth technology discussing the above problems.   An additional ***minutes spent charting (reviewing prior notes, hospital records, studies, labs etc.) Total ***minutes   Medication Adjustments/Labs and Tests Ordered: Current medicines are reviewed at length with the patient today.  Concerns regarding medicines are outlined above.   There are no Patient Instructions on file for this visit.   Signed, , MD  11/28/2021 11:55 AM    [facility]

## 2021-11-29 ENCOUNTER — Telehealth: Payer: Self-pay | Admitting: Cardiology

## 2021-11-29 ENCOUNTER — Other Ambulatory Visit: Payer: Self-pay

## 2021-11-29 ENCOUNTER — Telehealth (HOSPITAL_COMMUNITY): Payer: Self-pay | Admitting: Licensed Clinical Social Worker

## 2021-11-29 ENCOUNTER — Ambulatory Visit (INDEPENDENT_AMBULATORY_CARE_PROVIDER_SITE_OTHER): Payer: Medicaid Other | Admitting: Cardiology

## 2021-11-29 ENCOUNTER — Encounter: Payer: Self-pay | Admitting: Cardiology

## 2021-11-29 VITALS — Ht 74.0 in | Wt 163.0 lb

## 2021-11-29 DIAGNOSIS — I77810 Thoracic aortic ectasia: Secondary | ICD-10-CM

## 2021-11-29 DIAGNOSIS — I5042 Chronic combined systolic (congestive) and diastolic (congestive) heart failure: Secondary | ICD-10-CM | POA: Diagnosis not present

## 2021-11-29 DIAGNOSIS — Q231 Congenital insufficiency of aortic valve: Secondary | ICD-10-CM | POA: Diagnosis not present

## 2021-11-29 DIAGNOSIS — Z01812 Encounter for preprocedural laboratory examination: Secondary | ICD-10-CM

## 2021-11-29 DIAGNOSIS — I428 Other cardiomyopathies: Secondary | ICD-10-CM | POA: Diagnosis not present

## 2021-11-29 DIAGNOSIS — F1910 Other psychoactive substance abuse, uncomplicated: Secondary | ICD-10-CM

## 2021-11-29 MED ORDER — LISINOPRIL 2.5 MG PO TABS
2.5000 mg | ORAL_TABLET | Freq: Every day | ORAL | 3 refills | Status: DC
  Filled 2021-11-29: qty 90, 90d supply, fill #0

## 2021-11-29 NOTE — Telephone Encounter (Signed)
H&V Care Navigation CSW Progress Note  Clinical Social Worker consulted to ensure pt has access to transportation on 8/25 for heart cath  CSW called pt to discuss- informed he would need responsible party to take him and inquired if his wife could be available that day.  Pt with wife at that time so CSW was able to confirm directly with wife that she could assist him in getting to and from the appt.  SDOH Screenings   Alcohol Screen: Low Risk  (11/21/2020)   Alcohol Screen    Last Alcohol Screening Score (AUDIT): 1  Depression (PHQ2-9): Low Risk  (07/12/2021)   Depression (PHQ2-9)    PHQ-2 Score: 1  Financial Resource Strain: High Risk (10/31/2021)   Overall Financial Resource Strain (CARDIA)    Difficulty of Paying Living Expenses: Very hard  Food Insecurity: No Food Insecurity (10/31/2021)   Hunger Vital Sign    Worried About Running Out of Food in the Last Year: Never true    Ran Out of Food in the Last Year: Never true  Housing: Medium Risk (10/31/2021)   Housing    Last Housing Risk Score: 1  Physical Activity: Unknown (03/01/2018)   Exercise Vital Sign    Days of Exercise per Week: 7 days    Minutes of Exercise per Session: Patient refused  Social Connections: Moderately Isolated (03/01/2018)   Social Connection and Isolation Panel [NHANES]    Frequency of Communication with Friends and Family: Never    Frequency of Social Gatherings with Friends and Family: Never    Attends Religious Services: Never    Database administrator or Organizations: No    Attends Banker Meetings: Never    Marital Status: Married  Stress: Stress Concern Present (03/01/2018)   [employer] of Occupational Health - Occupational Stress Questionnaire    Feeling of Stress : Very much  Tobacco Use: High Risk (11/29/2021)   Patient History    Smoking Tobacco Use: Some Days    Smokeless Tobacco Use: Never    Passive Exposure: Not on file  Transportation Needs: Unmet Transportation Needs  (11/11/2021)   PRAPARE - Administrator, Civil Service (Medical): Yes    Lack of Transportation (Non-Medical): No    Burna Sis, LCSW Clinical Social Worker [facility] Desk#: [phone] Cell#: [phone]

## 2021-11-29 NOTE — Telephone Encounter (Signed)
I called and spoke with the patient to review the date/ time of his procedure per his virtual visit with Dr. Herbie Baltimore today.  The patient is aware that his cath is scheduled for 12/20/21 in [place] with Dr. Herbie Baltimore (arrive at 8:30 am) at [facility].   I advised I would like to send his instructions for his procedure through his MyChart.  Per the patient, he cannot access his mychart.   I advised I will send a copy of his instructions through the mail. I have also verified with him that his mailing address on file is correct.   Case Management had already confirmed with him, prior to my call, that his wife can provide transportation for him to [place] on 12/20/21.  I advised the patient he will also need pre-procedure lab work prior to his cath anytime between now and 1 week prior to his procedure depending on when he can get his transportation worked out. Per the patient, his wife works 12 hour shifts in [place] and then stays with her mother who has had a stroke. He is unsure when she would be able to get him to the lab.  The patient then advised me that his wife was with him at the time of my call. I have discussed the patient's need for lab work prior to his heart cath.  The patient's wife advised she will get him to [place] on 8/25. I have advised her his lab work can be done in [place] between now & 8/18 (whenever she is able to provide him transportation). She is aware this can be done at the [facility] of [facility] on a walk in basis-- Monday-Friday 7:30 am-6:00 pm.   The patient's wife voices understanding and is agreeable.
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
## 2021-11-29 NOTE — Patient Instructions (Addendum)
Medication Instructions:   We are refilling Lisinopril 2.5 mg daily  *If you need a refill on your cardiac medications before your next appointment, please call your pharmacy*   Lab Work:  CBC, BMP - 1 week pre-procedure  Medical Mall Entrance at [facility]) 1st desk on the right to check in (REGISTRATION)  Lab hours: Monday- Friday (7:30 am- 5:30 pm)   If you have labs (blood work) drawn today and your tests are completely normal, you will receive your results only by: MyChart Message (if you have MyChart) OR A paper copy in the mail If you have any lab test that is abnormal or we need to change your treatment, we will call you to review the results.   Testing/Procedures: Your physician has requested that you have a cardiac catheterization. Cardiac catheterization is used to diagnose and/or treat various heart conditions. Doctors may recommend this procedure for a number of different reasons. The most common reason is to evaluate chest pain. Chest pain can be a symptom of coronary artery disease (CAD), and cardiac catheterization can show whether plaque is narrowing or blocking your heart's arteries. This procedure is also used to evaluate the valves, as well as measure the blood flow and oxygen levels in different parts of your heart.    You are scheduled for a Cardiac Catheterization on Friday, August 25 with Dr. Bryan Lemma.  1. Please arrive at the Main Entrance A at [facility]: [address] [place], [place] [address] at 8:30 am (This time is two hours before your procedure to ensure your preparation). Free valet parking service is available.   Special note: Every effort is made to have your procedure done on time. Please understand that emergencies sometimes delay scheduled procedures.  2. Diet: Do not eat solid foods after midnight.  You may have clear liquids until 5 AM upon the day of the procedure.  3. Labs: You will need to have blood drawn  1 week  before the procedure (12/20/2021) - so the week ov 8/18  CBC & BMP   4. Medication instructions in preparation for your procedure:   Contrast Allergy: No   STOP taking Lisinopril (Zestril or Prinivil) & furosemide on the day of procedure- these will be resumed after your procedure.   On the morning of your procedure, take Aspirin and any morning medicines NOT listed above.  You may use sips of water.  5. Plan to go home the same day, you will only stay overnight if medically necessary. 6. You MUST have a responsible adult to drive you home. 7. An adult MUST be with you the first 24 hours after you arrive home. 8. Bring a current list of your medications, and the last time and date medication taken. 9. Bring ID and current insurance cards. 10.Please wear clothes that are easy to get on and off and wear slip-on shoes.  Thank you for allowing [place] to care for you!   --  Invasive Cardiovascular services     Follow-Up: At [facility], you and your health needs are our priority.  As part of our continuing mission to provide you with exceptional heart care, we have created designated Provider Care Teams.  These Care Teams include your primary Cardiologist (physician) and Advanced Practice Providers (APPs -  Physician Assistants and Nurse Practitioners) who all work together to provide you with the care you need, when you need it.  We recommend signing up for the patient portal called "MyChart".  Sign up information is provided on this After Visit Summary.  MyChart is used to connect with patients for Virtual Visits (Telemedicine).  Patients are able to view lab/test results, encounter notes, upcoming appointments, etc.  Non-urgent messages can be sent to your provider as well.   To learn more about what you can do with MyChart, go to [email or web address].    Your next appointment:   6 week(s) - 01/02/2022 - 9:20 AM  The format for your next appointment:   Preferred In-person -  but Virtual is acceptable if unable to get transportation  Provider:   You may see Bryan Lemma, MD or one of the following Advanced Practice Providers on your designated Care Team:   Nicolasa Ducking, NP Eula Listen, PA-C Cadence Fransico Michael, [place]    Other Instructions  Keep weighing yourself every day.  If your weight goes up more than 3 pounds from your baseline double up your furosemide dose.  If you have any significant changes to your symptoms were you feeling worse, need to be proactive about contacting our office to see if either need to be admitted to the hospital or have your heart catheterization procedure moving forward.   Important Information About Sugar

## 2021-11-29 NOTE — Assessment & Plan Note (Signed)
Starting note orthopnea and edema with exertional dyspnea.  Not to the point of needing hospitalization therefore not quite NYHA Class III, but at least Class IIb.  Unfortunately, I have only seen him 1 time have not been uptitrate medications. Anticipate attempting to convert to ARB from ACE inhibitor with intention to potentially convert to [facility].  We will also need to consider low-dose beta-blocker and spironolactone as well as SGLT2 inhibitor. He would likely be best suited in the advanced heart failure clinic where they can do more of home health based care and arrange for assistance medication  He could also benefit from more close follow-up and counseling.
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
## 2021-11-29 NOTE — Assessment & Plan Note (Addendum)
Echocardiogram suggested EF of 40 to 45% with global HK moderate biventricular dilation, however cardiac MRI indicates EF more like 30% for both ventricles with severe dilation.  This in the setting of severe AI.  He is probably having NYHA Class IIb to almost 3 symptoms based on his discussion, he is very difficult to manage because reluctance to take medications and to actually come to appointments (probably not his fault due to financial constraints). All he has been able to tolerate has been very low-dose of lisinopril and standing dose of Lasix with PRN dosing.  Indeed he will need closer follow-up in order to potentially titrate his medications now that we know his EF is dramatically reduced.  Would need cross assessment and analysis to determine if he would be a tolerate medicine such as Entresto, Farxiga/Jardiance.  In the past he stopped taking medicines because he did not feel good so I am leery of being overly aggressive.  Plan: Due to the progression of his valvular myopathy, would likely need surgical evaluation as his disease will only progress without AVR. 1. Step 1 Right and Left Heart Catheterization -> scheduled for 12/20/2021 at [facility]. 2. While at [facility], would likely get CVTS consultation as well as advanced CHF clinic consultation set up 3. Close follow-up appointment in order to titrate medications

## 2021-11-29 NOTE — Assessment & Plan Note (Signed)
Cardiac MRI showed that the aorta is 4.2 mm.  I think is probably not so large that the AI is functional. Would defer further testing of the aorta to CVTS as far as determining if aortic root replacement is necessary.

## 2021-11-29 NOTE — Assessment & Plan Note (Signed)
As was the case with Dr. Gerri Spore O'Neal's assessment, AI was more significant than felt to be the case on echocardiogram there is particularly severe AI with biventricular failure.  Given the reduction in EF I think aortic valve replacement is clearly indicated, although we have probably not pass beyond the point of benefit.  Plan: Schedule right left heart catheterization -> we will schedule at [facility] to allow for CVTS to get him scheduled for visit and potentially have the other tests ordered.  We will also discuss with Advanced Heart Failure for assistance.

## 2021-11-29 NOTE — Progress Notes (Signed)
Virtual visit was initiated with CMA telephone call obtaining vitals and complaints.  However just prior to initiating the virtual visit, the visit was canceled due to an emergency that required me to go to the emergency room to care for patient.  By the time this was completed, there is no sufficient time in the clinic today to complete the visit.  In order to expeditiously care for the patient, the visit was moved to 11/29/2021.  Bryan Lemma, MD

## 2021-12-01 ENCOUNTER — Inpatient Hospital Stay (HOSPITAL_COMMUNITY)
Admission: EM | Admit: 2021-12-01 | Discharge: 2021-12-05 | DRG: 286 | Disposition: A | Discharge status: Home / Self Care | Payer: Medicaid Other | Attending: Cardiology | Admitting: Cardiology

## 2021-12-01 DIAGNOSIS — Z79899 Other long term (current) drug therapy: Secondary | ICD-10-CM

## 2021-12-01 DIAGNOSIS — Z8616 Personal history of COVID-19: Secondary | ICD-10-CM

## 2021-12-01 DIAGNOSIS — Z880 Allergy status to penicillin: Secondary | ICD-10-CM

## 2021-12-01 DIAGNOSIS — Z7982 Long term (current) use of aspirin: Secondary | ICD-10-CM

## 2021-12-01 DIAGNOSIS — F1910 Other psychoactive substance abuse, uncomplicated: Secondary | ICD-10-CM

## 2021-12-01 DIAGNOSIS — R0602 Shortness of breath: Secondary | ICD-10-CM

## 2021-12-01 DIAGNOSIS — F17210 Nicotine dependence, cigarettes, uncomplicated: Secondary | ICD-10-CM | POA: Diagnosis present

## 2021-12-01 DIAGNOSIS — Z91119 Patient's noncompliance with dietary regimen due to unspecified reason: Secondary | ICD-10-CM

## 2021-12-01 DIAGNOSIS — Z91148 Patient's other noncompliance with medication regimen for other reason: Secondary | ICD-10-CM

## 2021-12-01 DIAGNOSIS — I110 Hypertensive heart disease with heart failure: Principal | ICD-10-CM | POA: Diagnosis present

## 2021-12-01 DIAGNOSIS — I5043 Acute on chronic combined systolic (congestive) and diastolic (congestive) heart failure: Secondary | ICD-10-CM | POA: Diagnosis present

## 2021-12-01 DIAGNOSIS — I5042 Chronic combined systolic (congestive) and diastolic (congestive) heart failure: Secondary | ICD-10-CM | POA: Diagnosis present

## 2021-12-01 DIAGNOSIS — I351 Nonrheumatic aortic (valve) insufficiency: Secondary | ICD-10-CM | POA: Diagnosis present

## 2021-12-01 DIAGNOSIS — I77810 Thoracic aortic ectasia: Secondary | ICD-10-CM

## 2021-12-01 DIAGNOSIS — I77819 Aortic ectasia, unspecified site: Secondary | ICD-10-CM | POA: Diagnosis present

## 2021-12-01 DIAGNOSIS — E8770 Fluid overload, unspecified: Secondary | ICD-10-CM | POA: Diagnosis present

## 2021-12-01 DIAGNOSIS — I358 Other nonrheumatic aortic valve disorders: Secondary | ICD-10-CM | POA: Diagnosis present

## 2021-12-01 DIAGNOSIS — Z888 Allergy status to other drugs, medicaments and biological substances status: Secondary | ICD-10-CM

## 2021-12-01 DIAGNOSIS — I428 Other cardiomyopathies: Secondary | ICD-10-CM | POA: Diagnosis present

## 2021-12-01 DIAGNOSIS — F1410 Cocaine abuse, uncomplicated: Secondary | ICD-10-CM | POA: Diagnosis present

## 2021-12-01 DIAGNOSIS — Z882 Allergy status to sulfonamides status: Secondary | ICD-10-CM

## 2021-12-01 DIAGNOSIS — F1210 Cannabis abuse, uncomplicated: Secondary | ICD-10-CM | POA: Diagnosis present

## 2021-12-01 DIAGNOSIS — Z841 Family history of disorders of kidney and ureter: Secondary | ICD-10-CM

## 2021-12-01 DIAGNOSIS — F1010 Alcohol abuse, uncomplicated: Secondary | ICD-10-CM | POA: Diagnosis present

## 2021-12-01 DIAGNOSIS — I200 Unstable angina: Secondary | ICD-10-CM | POA: Diagnosis present

## 2021-12-01 DIAGNOSIS — Z8249 Family history of ischemic heart disease and other diseases of the circulatory system: Secondary | ICD-10-CM

## 2021-12-01 DIAGNOSIS — J449 Chronic obstructive pulmonary disease, unspecified: Secondary | ICD-10-CM | POA: Diagnosis present

## 2021-12-01 DIAGNOSIS — I5082 Biventricular heart failure: Secondary | ICD-10-CM | POA: Diagnosis present

## 2021-12-01 DIAGNOSIS — E119 Type 2 diabetes mellitus without complications: Secondary | ICD-10-CM | POA: Diagnosis present

## 2021-12-01 DIAGNOSIS — I5030 Unspecified diastolic (congestive) heart failure: Secondary | ICD-10-CM | POA: Diagnosis present

## 2021-12-01 DIAGNOSIS — Z833 Family history of diabetes mellitus: Secondary | ICD-10-CM

## 2021-12-01 DIAGNOSIS — I509 Heart failure, unspecified: Principal | ICD-10-CM

## 2021-12-01 DIAGNOSIS — Z87442 Personal history of urinary calculi: Secondary | ICD-10-CM

## 2021-12-01 DIAGNOSIS — Z635 Disruption of family by separation and divorce: Secondary | ICD-10-CM

## 2021-12-01 DIAGNOSIS — I272 Pulmonary hypertension, unspecified: Secondary | ICD-10-CM | POA: Diagnosis present

## 2021-12-01 DIAGNOSIS — Q231 Congenital insufficiency of aortic valve: Secondary | ICD-10-CM

## 2021-12-01 MED ORDER — ALBUTEROL SULFATE HFA 108 (90 BASE) MCG/ACT IN AERS: 2.0000 | INHALATION_SPRAY | RESPIRATORY_TRACT | Status: DC | PRN

## 2021-12-01 NOTE — ED Triage Notes (Signed)
Pt c/o SHOB "off & on for months, but it's never been so violent." Denies associated sick symptoms. States he used wife's albuterol inhaler as emergency. Hx CHF, HTN, COPD

## 2021-12-02 ENCOUNTER — Other Ambulatory Visit: Payer: Self-pay

## 2021-12-02 ENCOUNTER — Encounter (HOSPITAL_COMMUNITY)
Admission: EM | Disposition: A | Discharge status: Home / Self Care | Payer: Self-pay | Source: Home / Self Care | Attending: Cardiology

## 2021-12-02 ENCOUNTER — Emergency Department (HOSPITAL_COMMUNITY): Payer: Medicaid Other

## 2021-12-02 ENCOUNTER — Telehealth (HOSPITAL_COMMUNITY): Payer: Self-pay

## 2021-12-02 ENCOUNTER — Encounter (HOSPITAL_COMMUNITY): Payer: Self-pay | Admitting: Internal Medicine

## 2021-12-02 ENCOUNTER — Other Ambulatory Visit (HOSPITAL_COMMUNITY): Payer: Self-pay

## 2021-12-02 DIAGNOSIS — Z833 Family history of diabetes mellitus: Secondary | ICD-10-CM | POA: Diagnosis not present

## 2021-12-02 DIAGNOSIS — Q231 Congenital insufficiency of aortic valve: Secondary | ICD-10-CM | POA: Diagnosis not present

## 2021-12-02 DIAGNOSIS — Z8616 Personal history of COVID-19: Secondary | ICD-10-CM | POA: Diagnosis not present

## 2021-12-02 DIAGNOSIS — Z841 Family history of disorders of kidney and ureter: Secondary | ICD-10-CM | POA: Diagnosis not present

## 2021-12-02 DIAGNOSIS — I5023 Acute on chronic systolic (congestive) heart failure: Secondary | ICD-10-CM | POA: Diagnosis not present

## 2021-12-02 DIAGNOSIS — I5043 Acute on chronic combined systolic (congestive) and diastolic (congestive) heart failure: Secondary | ICD-10-CM | POA: Diagnosis present

## 2021-12-02 DIAGNOSIS — F1010 Alcohol abuse, uncomplicated: Secondary | ICD-10-CM | POA: Diagnosis present

## 2021-12-02 DIAGNOSIS — I5042 Chronic combined systolic (congestive) and diastolic (congestive) heart failure: Secondary | ICD-10-CM | POA: Diagnosis not present

## 2021-12-02 DIAGNOSIS — F1410 Cocaine abuse, uncomplicated: Secondary | ICD-10-CM | POA: Diagnosis present

## 2021-12-02 DIAGNOSIS — I428 Other cardiomyopathies: Secondary | ICD-10-CM | POA: Diagnosis present

## 2021-12-02 DIAGNOSIS — I272 Pulmonary hypertension, unspecified: Secondary | ICD-10-CM | POA: Diagnosis present

## 2021-12-02 DIAGNOSIS — I351 Nonrheumatic aortic (valve) insufficiency: Secondary | ICD-10-CM | POA: Diagnosis present

## 2021-12-02 DIAGNOSIS — I5082 Biventricular heart failure: Secondary | ICD-10-CM | POA: Diagnosis present

## 2021-12-02 DIAGNOSIS — I200 Unstable angina: Secondary | ICD-10-CM | POA: Diagnosis present

## 2021-12-02 DIAGNOSIS — I110 Hypertensive heart disease with heart failure: Secondary | ICD-10-CM | POA: Diagnosis present

## 2021-12-02 DIAGNOSIS — F1210 Cannabis abuse, uncomplicated: Secondary | ICD-10-CM | POA: Diagnosis present

## 2021-12-02 DIAGNOSIS — J449 Chronic obstructive pulmonary disease, unspecified: Secondary | ICD-10-CM | POA: Diagnosis present

## 2021-12-02 DIAGNOSIS — I5033 Acute on chronic diastolic (congestive) heart failure: Secondary | ICD-10-CM | POA: Diagnosis not present

## 2021-12-02 DIAGNOSIS — E119 Type 2 diabetes mellitus without complications: Secondary | ICD-10-CM | POA: Diagnosis present

## 2021-12-02 DIAGNOSIS — I77819 Aortic ectasia, unspecified site: Secondary | ICD-10-CM | POA: Diagnosis present

## 2021-12-02 DIAGNOSIS — E8770 Fluid overload, unspecified: Secondary | ICD-10-CM | POA: Diagnosis present

## 2021-12-02 DIAGNOSIS — Z91148 Patient's other noncompliance with medication regimen for other reason: Secondary | ICD-10-CM | POA: Diagnosis not present

## 2021-12-02 DIAGNOSIS — Z635 Disruption of family by separation and divorce: Secondary | ICD-10-CM | POA: Diagnosis not present

## 2021-12-02 DIAGNOSIS — Z8249 Family history of ischemic heart disease and other diseases of the circulatory system: Secondary | ICD-10-CM | POA: Diagnosis not present

## 2021-12-02 DIAGNOSIS — I5022 Chronic systolic (congestive) heart failure: Secondary | ICD-10-CM | POA: Diagnosis not present

## 2021-12-02 DIAGNOSIS — Z01810 Encounter for preprocedural cardiovascular examination: Secondary | ICD-10-CM | POA: Diagnosis not present

## 2021-12-02 DIAGNOSIS — Z91119 Patient's noncompliance with dietary regimen due to unspecified reason: Secondary | ICD-10-CM | POA: Diagnosis not present

## 2021-12-02 DIAGNOSIS — I358 Other nonrheumatic aortic valve disorders: Secondary | ICD-10-CM | POA: Diagnosis present

## 2021-12-02 DIAGNOSIS — Z87442 Personal history of urinary calculi: Secondary | ICD-10-CM | POA: Diagnosis not present

## 2021-12-02 LAB — BASIC METABOLIC PANEL
Anion gap: 8 (ref 5–15)
Anion gap: 10 (ref 5–15)
BUN: 26 mg/dL — ABNORMAL HIGH (ref 6–20)
BUN: 23 mg/dL — ABNORMAL HIGH (ref 6–20)
CO2: 20 mmol/L — ABNORMAL LOW (ref 22–32)
CO2: 22 mmol/L (ref 22–32)
Calcium: 9 mg/dL (ref 8.9–10.3)
Calcium: 9.5 mg/dL (ref 8.9–10.3)
Chloride: 113 mmol/L — ABNORMAL HIGH (ref 98–111)
Chloride: 109 mmol/L (ref 98–111)
Creatinine, Ser: 1.25 mg/dL — ABNORMAL HIGH (ref 0.61–1.24)
Creatinine, Ser: 1.52 mg/dL — ABNORMAL HIGH (ref 0.61–1.24)
GFR, Estimated: >60 mL/min (ref >60)
GFR, Estimated: 54 mL/min — ABNORMAL LOW (ref >60)
Glucose, Bld: 133 mg/dL — ABNORMAL HIGH (ref 70–99)
Glucose, Bld: 97 mg/dL (ref 70–99)
Potassium: 3.5 mmol/L (ref 3.5–5.1)
Potassium: 4.2 mmol/L (ref 3.5–5.1)
Sodium: 141 mmol/L (ref 135–145)
Sodium: 141 mmol/L (ref 135–145)

## 2021-12-02 LAB — CBC WITH DIFFERENTIAL/PLATELET
Abs Immature Granulocytes: 0.02 10*3/uL (ref 0.00–0.07)
Basophils Absolute: 0 10*3/uL (ref 0.0–0.1)
Basophils Relative: 0 %
Eosinophils Absolute: 0.1 10*3/uL (ref 0.0–0.5)
Eosinophils Relative: 1 %
HCT: 39 % (ref 39.0–52.0)
Hemoglobin: 13 g/dL (ref 13.0–17.0)
Immature Granulocytes: 0 %
Lymphocytes Relative: 13 %
Lymphs Abs: 0.7 10*3/uL (ref 0.7–4.0)
MCH: 31.6 pg (ref 26.0–34.0)
MCHC: 33.3 g/dL (ref 30.0–36.0)
MCV: 94.9 fL (ref 80.0–100.0)
Monocytes Absolute: 0.3 10*3/uL (ref 0.1–1.0)
Monocytes Relative: 5 %
Neutro Abs: 4.5 10*3/uL (ref 1.7–7.7)
Neutrophils Relative %: 81 %
Platelets: 203 10*3/uL (ref 150–400)
RBC: 4.11 MIL/uL — ABNORMAL LOW (ref 4.22–5.81)
RDW: 12.6 % (ref 11.5–15.5)
WBC: 5.6 10*3/uL (ref 4.0–10.5)
nRBC: 0 % (ref 0.0–0.2)

## 2021-12-02 LAB — TROPONIN I (HIGH SENSITIVITY)
Troponin I (High Sensitivity): 54 ng/L — ABNORMAL HIGH (ref <18)
Troponin I (High Sensitivity): 69 ng/L — ABNORMAL HIGH (ref <18)

## 2021-12-02 LAB — BRAIN NATRIURETIC PEPTIDE: B Natriuretic Peptide: 3097.3 pg/mL — ABNORMAL HIGH (ref 0.0–100.0)

## 2021-12-02 SURGERY — RIGHT/LEFT HEART CATH AND CORONARY ANGIOGRAPHY
Anesthesia: LOCAL

## 2021-12-02 MED ORDER — FUROSEMIDE 10 MG/ML IJ SOLN
40.0000 mg | Freq: Two times a day (BID) | INTRAMUSCULAR | Status: DC
  Administered 2021-12-02 (×2): 40 mg via INTRAVENOUS
  Filled 2021-12-02 (×2): qty 4

## 2021-12-02 MED ORDER — HEPARIN SODIUM (PORCINE) 5000 UNIT/ML IJ SOLN
5000.0000 [IU] | Freq: Three times a day (TID) | INTRAMUSCULAR | Status: DC
  Administered 2021-12-02 – 2021-12-03 (×4): 5000 [IU] via SUBCUTANEOUS
  Filled 2021-12-02 (×4): qty 1

## 2021-12-02 MED ORDER — SODIUM CHLORIDE 0.9% FLUSH
3.0000 mL | Freq: Two times a day (BID) | INTRAVENOUS | Status: DC
  Administered 2021-12-03 (×2): 3 mL via INTRAVENOUS

## 2021-12-02 MED ORDER — SODIUM CHLORIDE 0.9 % IV SOLN: INTRAVENOUS | Status: DC

## 2021-12-02 MED ORDER — LORAZEPAM 1 MG PO TABS
1.0000 mg | ORAL_TABLET | Freq: Three times a day (TID) | ORAL | Status: DC | PRN
  Administered 2021-12-02 – 2021-12-03 (×3): 1 mg via ORAL
  Filled 2021-12-02 (×4): qty 1

## 2021-12-02 MED ORDER — ASPIRIN 81 MG PO TBEC
81.0000 mg | DELAYED_RELEASE_TABLET | Freq: Every day | ORAL | Status: DC
  Administered 2021-12-02 – 2021-12-05 (×4): 81 mg via ORAL
  Filled 2021-12-02 (×4): qty 1

## 2021-12-02 MED ORDER — SODIUM CHLORIDE 0.9 % IV SOLN: 250.0000 mL | INTRAVENOUS | Status: DC | PRN

## 2021-12-02 MED ORDER — POTASSIUM CHLORIDE CRYS ER 20 MEQ PO TBCR
20.0000 meq | EXTENDED_RELEASE_TABLET | Freq: Once | ORAL | Status: AC
Start: 2021-12-02 — End: 2021-12-02
  Administered 2021-12-02: 20 meq via ORAL
  Filled 2021-12-02: qty 1

## 2021-12-02 MED ORDER — SPIRONOLACTONE 12.5 MG HALF TABLET
12.5000 mg | ORAL_TABLET | Freq: Every day | ORAL | Status: DC
  Administered 2021-12-02 – 2021-12-05 (×4): 12.5 mg via ORAL
  Filled 2021-12-02 (×4): qty 1

## 2021-12-02 MED ORDER — SODIUM CHLORIDE 0.9% FLUSH
3.0000 mL | INTRAVENOUS | Status: DC | PRN
  Administered 2021-12-02: 3 mL via INTRAVENOUS

## 2021-12-02 MED ORDER — ACETAMINOPHEN 325 MG PO TABS: 650.0000 mg | ORAL_TABLET | ORAL | Status: DC | PRN

## 2021-12-02 MED ORDER — ONDANSETRON HCL 4 MG/2ML IJ SOLN: 4.0000 mg | Freq: Four times a day (QID) | INTRAMUSCULAR | Status: DC | PRN

## 2021-12-02 MED ORDER — LISINOPRIL 2.5 MG PO TABS
2.5000 mg | ORAL_TABLET | Freq: Every day | ORAL | Status: DC
  Filled 2021-12-02: qty 1

## 2021-12-02 MED ORDER — DAPAGLIFLOZIN PROPANEDIOL 10 MG PO TABS: 10.0000 mg | ORAL_TABLET | Freq: Every day | ORAL | Status: DC

## 2021-12-02 MED ORDER — SODIUM CHLORIDE 0.9% FLUSH: 3.0000 mL | INTRAVENOUS | Status: DC | PRN

## 2021-12-02 MED ORDER — METOPROLOL SUCCINATE ER 25 MG PO TB24
25.0000 mg | ORAL_TABLET | Freq: Every day | ORAL | Status: DC
  Administered 2021-12-02 – 2021-12-05 (×4): 25 mg via ORAL
  Filled 2021-12-02 (×4): qty 1

## 2021-12-02 MED ORDER — ALBUTEROL SULFATE (2.5 MG/3ML) 0.083% IN NEBU: 2.5000 mg | INHALATION_SOLUTION | RESPIRATORY_TRACT | Status: DC | PRN

## 2021-12-02 MED ORDER — SODIUM CHLORIDE 0.9% FLUSH
3.0000 mL | Freq: Two times a day (BID) | INTRAVENOUS | Status: DC
Start: 2021-12-02 — End: 2021-12-05
  Administered 2021-12-03 – 2021-12-04 (×2): 3 mL via INTRAVENOUS

## 2021-12-02 MED ORDER — FUROSEMIDE 10 MG/ML IJ SOLN
20.0000 mg | Freq: Once | INTRAMUSCULAR | Status: AC
  Administered 2021-12-02: 20 mg via INTRAVENOUS
  Filled 2021-12-02: qty 2

## 2021-12-02 NOTE — Progress Notes (Signed)
Pt stated he doesn't wear CPAP at home, doesn't want to wear CPAP/Bipap for the night.

## 2021-12-02 NOTE — Telephone Encounter (Signed)
Pharmacy Patient Advocate Encounter  Insurance verification completed.    The patient does not have current prescription coverage.  The patient is currently admitted and ran test claims for the following: Jardiance, Farxigo, Entresto.  Copays and coinsurance results were relayed to Inpatient clinical team.

## 2021-12-02 NOTE — Progress Notes (Signed)
RT NOTE: patient resting comfortably this AM with no respiratory distress noted.  Bipap currently not indicated.  RT will continue to monitor.

## 2021-12-02 NOTE — H&P (View-Only) (Signed)
Progress Note  Patient Name: Darius [name] Date of Encounter: 12/03/2021  [facility] Cardiologist: Bryan Lemma, MD   Subjective   Comfortable this morning. Breathing significantly improved. States he can lay flat for the procedure.   Cr improved to 1.34 today  Net negative 3L Wt 152>147.8lbs  Inpatient Medications    Scheduled Meds:  aspirin EC  81 mg Oral Daily   furosemide  40 mg Intravenous BID   heparin  5,000 Units Subcutaneous Q8H   metoprolol succinate  25 mg Oral Daily   sodium chloride flush  3 mL Intravenous Q12H   sodium chloride flush  3 mL Intravenous Q12H   spironolactone  12.5 mg Oral Daily   Continuous Infusions:  sodium chloride     sodium chloride     sodium chloride     PRN Meds: sodium chloride, sodium chloride, acetaminophen, albuterol, LORazepam, ondansetron (ZOFRAN) IV, sodium chloride flush, sodium chloride flush   Vital Signs    Vitals:   12/02/21 1644 12/02/21 1925 12/02/21 2323 12/03/21 0432  BP: 125/82 117/71 120/86 126/81  Pulse: 81 74 75 71  Resp: 16 16 18 18   Temp: (!) 97.5 F (36.4 C) 98.5 F (36.9 C) 98.7 F (37.1 C) 97.9 F (36.6 C)  TempSrc: Oral Oral Oral Oral  SpO2: 98% 94% 99% 97%  Weight:    67 kg  Height:        Intake/Output Summary (Last 24 hours) at 12/03/2021 0638 Last data filed at 12/03/2021 0437 Gross per 24 hour  Intake 360 ml  Output 3400 ml  Net -3040 ml      12/03/2021    4:32 AM 12/02/2021    4:22 AM 11/29/2021   10:05 AM  Last 3 Weights  Weight (lbs) 147 lb 12.8 oz 152 lb 163 lb  Weight (kg) 67.042 kg 68.947 kg 73.936 kg      Telemetry    NSR - Personally Reviewed  ECG    12/02/21: NSR, LVH with strain- Personally Reviewed  Physical Exam   GEN: Laying in bed, comfortable Neck: JVD mildly elevated Cardiac: RRR, 2/6 holodiastolic murmur Respiratory: CTAB GI: Soft, nontender, non-distended  MS: Thin, warm, no edema Neuro:  Nonfocal  Psych: Normal affect   Labs    High Sensitivity  Troponin:   Recent Labs  Lab 12/02/21 0001 12/02/21 0248  TROPONINIHS 54* 69*     Chemistry Recent Labs  Lab 12/02/21 0001 12/02/21 1510  NA 141 141  K 3.5 4.2  CL 113* 109  CO2 20* 22  GLUCOSE 133* 97  BUN 26* 23*  CREATININE 1.25* 1.52*  CALCIUM 9.0 9.5  GFRNONAA >60 54*  ANIONGAP 8 10    Lipids No results for input(s): "CHOL", "TRIG", "HDL", "LABVLDL", "LDLCALC", "CHOLHDL" in the last 168 hours.  Hematology Recent Labs  Lab 12/02/21 0001  WBC 5.6  RBC 4.11*  HGB 13.0  HCT 39.0  MCV 94.9  MCH 31.6  MCHC 33.3  RDW 12.6  PLT 203   Thyroid No results for input(s): "TSH", "FREET4" in the last 168 hours.  BNP Recent Labs  Lab 12/02/21 0001  BNP 3,097.3*    DDimer No results for input(s): "DDIMER" in the last 168 hours.   Radiology    DG Chest 2 View  Result Date: 12/02/2021 CLINICAL DATA:  Dyspnea EXAM: CHEST - 2 VIEW COMPARISON:  09/15/2021 FINDINGS: The lungs are symmetrically well expanded. No pneumothorax or pleural effusion. Moderate cardiomegaly appears slightly progressive in the interval since  prior examination. There is increasing perihilar interstitial pulmonary infiltrate in keeping with progressive perihilar pulmonary edema as well as progressive central pulmonary vascular engorgement. No acute bone abnormality. IMPRESSION: Progressive cardiomegaly with development of mild cardiogenic failure. Electronically Signed   By: Helyn Numbers M.D.   On: 12/02/2021 00:34    Cardiac Studies   CMR 11/08/21:   FINDINGS: Left ventricle:   -Severe dilatation   -Moderate systolic dysfunction   -Elevated ECV (34%)   -Normal T2 values   -RV insertion site LGE   LV EF: 31% (Normal 56-78%)   Absolute volumes:   LV EDV: (Normal 77-195 mL)   LV ESV: (Normal 19-72 mL)   LV SV: (Normal 51-133 mL)   CO: 10.4L/min (Normal 2.8-8.8 L/min)   Indexed volumes:   LV EDV: 248mL/sq-m (Normal 47-92 mL/sq-m)   LV ESV: 187mL/sq-m (Normal  13-30 mL/sq-m)   LV SV: 34mL/sq-m (Normal 32-62 mL/sq-m)   CI: 5.5L/min/sq-m (Normal 1.7-4.2 L/min/sq-m)   Right ventricle: Severe dilatation with moderate systolic dysfunction   RV EF:  30% (Normal 47-74%)   Absolute volumes:   RV EDV: (Normal 88-227 mL)   RV ESV: (Normal 23-103 mL)   RV SV: 1mL (Normal 52-138 mL)   CO: 7.6L/min (Normal 2.8-8.8 L/min)   Indexed volumes:   RV EDV: 187mL/sq-m (Normal 55-105 mL/sq-m)   RV ESV: 11mL/sq-m (Normal 15-43 mL/sq-m)   RV SV: 70mL/sq-m (Normal 32-64 mL/sq-m)   CI: 4.0L/min/sq-m (Normal 1.7-4.2 L/min/sq-m)   Left atrium: Moderate enlargement   Right atrium: Mild enlargement   Mitral valve: Mild regurgitation   Aortic valve: Bicuspid aortic valve with fusion of right and noncoronary cusps. Severe regurgitation (regurgitant fraction 59%)   Tricuspid valve: Mild regurgitation   Pulmonic valve: No regurgitation   Aorta: Dilated ascending aorta measuring 43mm   Pericardium: Small effusion   IMPRESSION: 1. Bicuspid aortic valve with fusion of right and noncoronary cusps. Severe aortic regurgitation (regurgitant fraction 59%)   2.  Severe LV dilatation with moderate systolic dysfunction (EF 31%)   3.  Severe RV dilatation with moderate systolic dysfunction (EF 30%)   4. RV insertion site late gadolinium enhancement, which is a nonspecific finding often seen in setting of elevated pulmonary pressures   5. Dilated ascending aorta measuring 38mm  TTE 09/15/21: IMPRESSIONS     1. Bicuspid Aortic valve, No stenosis , Mild-Mod AI.   2. Bright peckled myocardium ,consider infiltrative process. Consider  Amyloid/Sarcoidosis.   3. Left ventricular ejection fraction, by estimation, is 40 to 45%. The  left ventricle has mildly decreased function. The left ventricle  demonstrates global hypokinesis. The left ventricular internal cavity size  was moderately to severely dilated. There   is mild concentric left  ventricular hypertrophy. Left ventricular  diastolic parameters are consistent with Grade III diastolic dysfunction  (restrictive).   4. Right ventricular systolic function is mildly reduced. The right  ventricular size is mildly enlarged. Mildly increased right ventricular  wall thickness.   5. Left atrial size was mildly dilated.   6. Right atrial size was mildly dilated.   7. The mitral valve is normal in structure. Trivial mitral valve  regurgitation.   8. The aortic valve is bicuspid. Aortic valve regurgitation is mild to  moderate. Aortic valve sclerosis is present, with no evidence of aortic  valve stenosis.   Patient Profile     51 y.o. male with history of bicuspid aortic valve with valvular cardiomyopathy, severe aortic insufficiency with biventricular failure,  chronic systolic heart failure with EF 35%, chronic polysubstance abuse (tobacco, alcohol, marijuana, and occasional cocaine) who presents with worsening SOB, orthopnea and PND found to have acute on chronic systolic heart failure exacerbation.  Assessment & Plan    #Acute on Chronic Systolic HF Exacerbation: #NICM: CMR with severe LV dilation, EF 31%, bicuspid AoV with severe AR, mild TR, mild MR, moderate RV systolic dysfunction. Thought to be due to valvular cardiomyopathy in the setting of severe AI, however, has not had ischemic work-up. Was planned for RHC/LHC as outpatient as part of surgical work-up for AVR but presented with worsening volume overload with BNP 3000. Likely acute exacerbation due to dietary noncompliance (has eaten at [facility] every morning for 1 week) and substance use (heavy alcohol use for past week). Responded well to diuresis with significant improvement in volume status. Plan for RHC/LHC today. -Plan for RHC/LHC today -Resume lasix as needed pending RHC numbers -Continue spironolactone 12.5mg  daily -Start farxiga 10mg  daily tomorrow as able -Hold lisinopril with plans to transition to  entresto in 36 hours (hopefully tomorrow pending renal function) -Continue low dose metop 25mg  XL daily -Monitor I/Os and daily weights -Discussed low Na diet at length; will need dietary counseling prior to discharge -Will continue surgical work-up for AVR as below  #Bicuspid AoV: #Severe AI: Patient with bicuspid aortic valve with severe AI. LV severely dilated with depressed EF at 31%. Undergoing surgical work-up but presented with worsening HF as detailed above. Will plan for pre-op LHC/RHC as inpatient. Will see CT surgery as outpatient for consideration of AVR once more clinically compensated. -Manage HF as above -Plan for RHC/LHC today -Plan for CT surgery evaluation as outpatient  #Polysubstance Abuse: Admits to drinking, smoking marijuana and doing cocaine. States he is very motivated to quit and wants to ensure he is able to undergo surgery for his valve.   INFORMED CONSENT: I have reviewed the risks, indications, and alternatives to cardiac catheterization, possible angioplasty, and stenting with the patient. Risks include but are not limited to bleeding, infection, vascular injury, stroke, myocardial infection, arrhythmia, kidney injury, radiation-related injury in the case of prolonged fluoroscopy use, emergency cardiac surgery, and death. The patient understands the risks of serious complication is 1-2 in 1000 with diagnostic cardiac cath and 1-2% or less with angioplasty/stenting.    For questions or updates, please contact [facility] Please consult [email or web address] for contact info under        Signed, , MD  12/03/2021, 6:38 AM

## 2021-12-02 NOTE — H&P (Signed)
Cardiology Admission History and Physical:   Patient ID: Darius [name] MRN: [id number]; DOB: Apr 09, 1971   Admission date: 12/01/2021  PCP:  Pcp, No   [facility] Providers Cardiologist:  Bryan Lemma, MD        Chief Complaint:  shortness of breath  Patient Profile:   Darius [name] is a 51 y.o. male with bicuspid AoV, HFrEF, severe AI who is being seen 12/02/2021 for the evaluation of shortness of breath.  History of Present Illness:   Darius [name] is a 51 year old male with a history of bicuspid aortic valve with valvular cardiomyopathy, severe aortic insufficiency with biventricular failure, chronic systolic heart failure with EF 35%, chronic polysubstance abuse (tobacco, alcohol, marijuana, and occasional cocaine) who presents with worsening heart failure exacerbation.  Notes that he had worsening shortness of breath over the last several days and much worse this evening.  Could not lie flat.  Not having any chest pain but worsening dyspnea, orthopnea, and lower extremity edema.  Try to take an extra Lasix and this evening but did not help.  He recently saw Dr. Herbie Baltimore in a virtual visit.  This was on 8/4, and at that time he was noted to have significant orthopnea with 3 pillows and occasional PND.  Notably, he was intermittently noncompliant with his medications but recently started them back including his Lasix.  He was scheduled to have a right and left heart catheterization on 12/20/2021 and then subsequently get cardiothoracic surgery consultation for consideration of aortic valve replacement.  Unfortunately he worsened and thus presented to the emergency department.  On arrival, he was in mild respiratory distress and tachypneic.  Emergency department offered BiPAP however patient declined.  He was given 20 mg of IV Lasix with some mild improvement.  Cardiology was consulted for admission.   Past Medical History:  Diagnosis Date   Aortic insufficiency due to bicuspid aortic valve  10/10/2021   Cardiac MRI 05/11/2021: Bicuspid aortic valve with fusion of the [place] cusps with severe AI (regurgitant fraction of 59%.   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  Severe AI 2/2 Bicusid AoV ([place] cusp fusion). Ascending Ao 42 mm   Valvular heart disease    a.) TTE on 12/19/2017 --> mild MV regurgitation; moderate AV regurgitation    Past Surgical History:  Procedure Laterality Date   Cardiac MRI  11/08/2021   Bicuspid aortic valve with fusion of the right and noncoronary cusps-severe AI (regurgitant fraction 59%); Severe biventricular dilation with moderate dysfunction (LVEF estimated 39%, RVEF estimated 30%); ascending aorta measuring 42 mm.   CYSTOSCOPY/URETEROSCOPY/HOLMIUM LASER/STENT PLACEMENT Right 01/29/2021   Procedure: CYSTOSCOPY/URETEROSCOPY/HOLMIUM LASER/STENT PLACEMENT;  Surgeon: Riki Altes, MD;  Location: [facility] ORS;  Service: Urology;  Laterality: Right;   KIDNEY STONE SURGERY     05/2021   NO PAST SURGERIES       Medications Prior to Admission: Prior to Admission medications  Medication Sig Start Date End Date Taking? Authorizing Provider  albuterol (PROVENTIL HFA) 108 (90 Base) MCG/ACT inhaler Inhale 2 puffs into the lungs once every 6 (six) hours as needed for wheezing or shortness of breath. 08/27/21   Iloabachie, Chioma E, NP  aspirin EC 81 MG tablet Take 81 mg by mouth daily. Swallow whole.    [provider]  feeding supplement (ENSURE ENLIVE / ENSURE PLUS) LIQD Take 237 mLs by mouth 2 (two) times daily between meals. 09/17/21   Enedina Finner, MD  fluticasone (FLONASE) 50 MCG/ACT nasal spray Place 1 spray into both nostrils once daily. 10/08/21   Iloabachie, Chioma E, NP  furosemide (LASIX) 20 MG tablet Take 20 mg (1 tablet) by mouth daily. If weight increases 3 or more pounds overnight, take 40 mg (2 tablets) 11/08/21   Clarisa Kindred A, FNP  lisinopril (ZESTRIL) 2.5 MG tablet Take 1 tablet (2.5 mg total) by mouth daily. 11/29/21   Marykay Lex, MD     Allergies:    Allergies  Allergen Reactions   Avelox [Moxifloxacin] Other (See Comments)    Thoracic Aneurysm.   Ciprofloxacin Other (See Comments)    Has thoracic Aneurysm   Penicillins Other (See Comments)    Patient states that mother is highly allergic. 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  Has patient had a PCN reaction causing immediate rash, facial/tongue/throat swelling, SOB or lightheadedness with hypotension: No Has patient had a PCN reaction causing severe rash involving mucus membranes or skin necrosis: No Has patient had a PCN reaction that required hospitalization Yes Has patient had a PCN reaction occurring within the last 10 years: No If all of the above answers are "N    Social History:   Social History   Socioeconomic History   Marital status: Legally Separated    Spouse name: Not on file   Number of children: Not on file   Years of education: Not on file   Highest education level: Not on file  Occupational History   Occupation: disability  Tobacco Use   Smoking status: Some Days    Packs/day: 0.25    Years: 35.00    Total pack years: 8.75    Types: Cigarettes    Last attempt to quit: 08/2020    Years since quitting: 1.2   Smokeless tobacco: Never   Tobacco comments:    Smokes 4-5 cigarettes per week.   Vaping Use   Vaping Use: Former   Quit date: 05/12/2021   Substances:  Nicotine, Flavoring  Substance and Sexual Activity   Alcohol use: Yes    Alcohol/week: 2.0 standard drinks of alcohol    Types: 2 Cans of beer per week   Drug use: Yes    Types: Marijuana, Other-see comments, "Crack" cocaine    Comment: last use of crack cocaine 01/2021 current use of marijuana   Sexual activity: Yes  Other Topics Concern   Not on file  Social History Narrative      > From cardiology clinic note on October 10, 2021:      He is "doing the best he can "to cut out cigarettes and drugs.  He is down to about 5 cigarettes a week-not buying any, and and only smoking but he can borrow.  However he was with some friends a couple days ago and was smoking some marijuana that "unbeknownst to him "was laced with "powder ".  He had a little bit of heart fast heart  rates and some discomfort in his chest after smoking "blunt".   Social Determinants of Health   Financial Resource Strain: High Risk (10/31/2021)   Overall Financial Resource Strain (CARDIA)    Difficulty of Paying Living Expenses: Very hard  Food Insecurity: No Food Insecurity (10/31/2021)   Hunger Vital Sign    Worried About Running Out of Food in the Last Year: Never true    Ran Out of Food in the Last Year: Never true  Transportation Needs: Unmet Transportation Needs (11/11/2021)   PRAPARE - Administrator, Civil Service (Medical): Yes    Lack of Transportation (Non-Medical): No  Physical Activity: Unknown (03/01/2018)   Exercise Vital Sign    Days of Exercise per Week: 7 days    Minutes of Exercise per Session: Patient refused  Stress: Stress Concern Present (03/01/2018)   [employer] of Occupational Health - Occupational Stress Questionnaire    Feeling of Stress : Very much  Social Connections: Moderately Isolated (03/01/2018)   Social Connection and Isolation Panel [NHANES]    Frequency of Communication with Friends and Family: Never    Frequency of Social Gatherings with Friends and Family: Never     Attends Religious Services: Never    Database administrator or Organizations: No    Attends Banker Meetings: Never    Marital Status: Married  Catering manager Violence: Not At Risk (03/01/2018)   Humiliation, Afraid, Rape, and Kick questionnaire    Fear of Current or Ex-Partner: No    Emotionally Abused: No    Physically Abused: No    Sexually Abused: No    Family History:   The patient's family history includes Diabetes in his father and mother; Hypertension in his maternal grandmother and mother; Kidney disease in his mother; Mitral valve prolapse in his maternal grandmother and mother; Other in his half-brother.    ROS:  Please see the history of present illness.  All other ROS reviewed and negative.     Physical Exam/Data:   Vitals:   12/01/21 2340 12/02/21 0134 12/02/21 0242 12/02/21 0245  BP: (!) 159/84 (!) 154/99 (!) 147/87 (!) 151/91  Pulse: 85 93  88  Resp: (!) 24 (!) 24 (!) 28 13  Temp: 97.7 F (36.5 C)     TempSrc: Oral     SpO2: 95% 93%  98%   No intake or output data in the 24 hours ending 12/02/21 0355    11/29/2021   10:05 AM 11/28/2021   11:15 AM 10/10/2021   10:43 AM  Last 3 Weights  Weight (lbs) 163 lb 154 lb 4.8 oz 154 lb 2 oz  Weight (kg) 73.936 kg 69.99 kg 69.911 kg     There is no height or weight on file to calculate BMI.  General:  nad, sleeping but mild tachypnea HEENT: normal Neck: elevated JVD to mandible Vascular:Distal pulses 2+ bilaterally   Cardiac:  normal S1, S2; RRR; harsh diastolic murmur Lungs:  coarse breath sounds and rales throughout  Abd: soft, nontender, no hepatomegaly  Ext: 1+ pitting edema Musculoskeletal:  No deformities, BUE and BLE strength normal and equal Skin: warm and dry  Neuro:  CNs 2-12 intact, no focal abnormalities noted Psych:  Normal affect    EKG:  The ECG that was done  was personally reviewed and demonstrates LVH but no ischemic st or t wave changes  Relevant CV Studies: Cardiac MRI  10/2021: 1. Bicuspid aortic valve with fusion of right and noncoronary cusps.  Severe aortic regurgitation (regurgitant fraction 59%)   2.  Severe LV dilatation with moderate systolic dysfunction (EF 31%)   3.  Severe RV dilatation with moderate systolic dysfunction (EF 30%)   4. RV insertion site late gadolinium enhancement, which is a nonspecific finding often seen in setting of elevated pulmonary pressures   5. Dilated ascending aorta measuring 22mm  Laboratory Data:  High Sensitivity Troponin:   Recent Labs  Lab 12/02/21 0001 12/02/21 0248  TROPONINIHS 54* 69*      Chemistry Recent Labs  Lab 12/02/21 0001  NA 141  K 3.5  CL 113*  CO2 20*  GLUCOSE 133*  BUN 26*  CREATININE 1.25*  CALCIUM 9.0  GFRNONAA >60  ANIONGAP 8    No results for input(s): "PROT", "ALBUMIN", "AST", "ALT", "ALKPHOS", "BILITOT" in the last 168 hours. Lipids No results for input(s): "CHOL", "TRIG", "HDL", "LABVLDL", "LDLCALC", "CHOLHDL" in the last 168 hours. Hematology Recent Labs  Lab 12/02/21 0001  WBC 5.6  RBC 4.11*  HGB 13.0  HCT 39.0  MCV 94.9  MCH 31.6  MCHC 33.3  RDW 12.6  PLT 203   Thyroid No results for input(s): "TSH", "FREET4" in the last 168 hours. BNP Recent Labs  Lab 12/02/21 0001  BNP 3,097.3*    DDimer No results for input(s): "DDIMER" in the last 168 hours.   Radiology/Studies:  DG Chest 2 View  Result Date: 12/02/2021 CLINICAL DATA:  Dyspnea EXAM: CHEST - 2 VIEW COMPARISON:  09/15/2021 FINDINGS: The lungs are symmetrically well expanded. No pneumothorax or pleural effusion. Moderate cardiomegaly appears slightly progressive in the interval since prior examination. There is increasing perihilar interstitial pulmonary infiltrate in keeping with progressive perihilar pulmonary edema as well as progressive central pulmonary vascular engorgement. No acute bone abnormality. IMPRESSION: Progressive cardiomegaly with development of mild cardiogenic failure.  Electronically Signed   By: Helyn Numbers M.D.   On: 12/02/2021 00:34     Assessment and Plan:   # Severe Aortic Regurgitation with Bicuspid AoV # Acute on Chronic HFrEF exacerbation (EF 35%) He has acute on chronic heart failure exacerbation in the setting of severe AI with severe dilation of his LV and reduced EF.  Given that he is presenting with heart failure exacerbation and has clear indications for consideration of aortic valve replacement, we will admit him for escalated workup including left and right heart catheterization and cardiothoracic surgery evaluation.  Medically, we will continue him on his home lisinopril and continue IV diuresis for symptomatic management. - continue IV diuresis; strict I&Os and daily weights - continue lisinopril 81 mg  - npo for L/RHC - CT surg consult - no echo ordered given recent cMRI   Risk Assessment/Risk Scores:       New York Heart Association (NYHA) Functional Class NYHA Class IV     Severity of Illness: The appropriate patient status for this patient is INPATIENT. Inpatient status is judged to be reasonable and necessary in order to provide the required intensity of service to ensure the patient's safety. The patient's presenting symptoms, physical exam findings, and initial radiographic and laboratory data in the context of their chronic comorbidities is felt to place them at high risk for further clinical deterioration. Furthermore, it is not anticipated that the patient will be medically stable for discharge from the hospital within 2 midnights of admission.   * I certify that at the point of admission it is my clinical judgment that the patient will require inpatient hospital care spanning beyond 2  midnights from the point of admission due to high intensity of service, high risk for further deterioration and high frequency of surveillance required.*   For questions or updates, please contact [facility] Please consult  [email or web address] for contact info under     Signed, Joellen Jersey, MD  12/02/2021 3:55 AM

## 2021-12-02 NOTE — H&P (Signed)
51 yo substance user with biventricular failure and severe AR. Substance abuse:Etoh, Cocaine, Tobacco For L and Right heart cath. Creat 1.52

## 2021-12-02 NOTE — ED Notes (Signed)
ED TO INPATIENT HANDOFF REPORT  ED Nurse Name and Phone #: Delice Bison, RN  S Name/Age/Gender Darius [name] 51 y.o. male Room/Bed: 037C/037C  Code Status   Code Status: Full Code  Home/SNF/Other Home Patient oriented to: self, place, time, and situation Is this baseline? Yes   Triage Complete: Triage complete  Chief Complaint Aortic regurgitation due to bicuspid aortic valve [Q23.1]  Triage Note Pt c/o SHOB "off & on for months, but it's never been so violent." Denies associated sick symptoms. States he used wife's albuterol inhaler as emergency. Hx CHF, HTN, COPD     Allergies Allergies  Allergen Reactions   Avelox [Moxifloxacin] Other (See Comments)    Thoracic Aneurysm.   Ciprofloxacin Other (See Comments)    Has thoracic Aneurysm   Penicillins Other (See Comments)    Patient states that mother is highly allergic. 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  Has patient had a PCN reaction causing immediate rash, facial/tongue/throat swelling, SOB or lightheadedness with hypotension: No Has patient had a PCN reaction causing severe rash involving mucus membranes or skin necrosis: No Has patient had a PCN reaction that required hospitalization Yes Has patient had a PCN reaction occurring within the last 10 years: No If all of the above answers are "N    Level of Care/Admitting Diagnosis ED Disposition     ED Disposition  Admit   Condition  --   Comment  Hospital Area: [facility] [[id number]]  Level of Care: Telemetry Cardiac [[id number]]  May admit patient to [facility] or [facility] if equivalent level of care is available:: No  Covid Evaluation: Asymptomatic - no recent exposure (last 10 days) testing not required  Diagnosis: Aortic regurgitation due to bicuspid aortic valve [[id number]]  Admitting Physician: Joellen Jersey [[id number]]  Attending Physician: Meriam Sprague [[id number]]  Certification:: I certify this patient will need  inpatient services for at least 2 midnights  Estimated Length of Stay: 2          B Medical/Surgery History Past Medical History:  Diagnosis Date   Aortic insufficiency due to bicuspid aortic valve 10/10/2021   Cardiac MRI 05/11/2021: Bicuspid aortic valve with fusion of the R & [place] cusps with severe AI (regurgitant fraction of 59%.   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  Severe AI 2/2 Bicusid AoV (R&[place] cusp fusion). Ascending Ao 42 mm   Valvular heart disease    a.) TTE on 12/19/2017 --> mild MV regurgitation; moderate AV regurgitation   Past Surgical History:  Procedure Laterality Date   Cardiac MRI  11/08/2021   Bicuspid aortic valve with fusion of the right and noncoronary cusps-severe AI (regurgitant fraction 59%); Severe biventricular dilation with moderate dysfunction (LVEF estimated 39%, RVEF estimated 30%); ascending aorta measuring 42 mm.   CYSTOSCOPY/URETEROSCOPY/HOLMIUM LASER/STENT PLACEMENT Right 01/29/2021   Procedure: CYSTOSCOPY/URETEROSCOPY/HOLMIUM LASER/STENT PLACEMENT;  Surgeon: Irineo Axon  C, MD;  Location: [facility] ORS;  Service: Urology;  Laterality: Right;   KIDNEY STONE SURGERY     05/2021   NO PAST SURGERIES       A IV Location/Drains/Wounds Patient Lines/Drains/Airways Status     Active Line/Drains/Airways     Name Placement date Placement time Site Days   Peripheral IV 12/02/21 20 G Anterior;Left Forearm  12/02/21  0246  Forearm  less than 1            Intake/Output Last 24 hours  Intake/Output Summary (Last 24 hours) at 12/02/2021 1154 Last data filed at 12/02/2021 1049 Gross per 24 hour  Intake --  Output 2575 ml  Net -2575 ml    Labs/Imaging Results for orders placed or performed during the hospital encounter of 12/01/21 (from the past 48 hour(s))  CBC with Differential     Status: Abnormal   Collection Time: 12/02/21 12:01 AM  Result Value Ref Range   WBC 5.6 4.0 - 10.5 K/uL   RBC 4.11 (L) 4.22 - 5.81 MIL/uL   Hemoglobin 13.0 13.0 - 17.0 g/dL   HCT 25.4 27.0 - 62.3 %   MCV 94.9 80.0 - 100.0 fL   MCH 31.6 26.0 - 34.0 pg   MCHC 33.3 30.0 - 36.0 g/dL   RDW 76.2 83.1 - 51.7 %   Platelets 203 150 - 400 K/uL   nRBC 0.0 0.0 - 0.2 %   Neutrophils Relative % 81 %   Neutro Abs 4.5 1.7 - 7.7 K/uL   Lymphocytes Relative 13 %   Lymphs Abs 0.7 0.7 - 4.0 K/uL   Monocytes Relative 5 %   Monocytes Absolute 0.3 0.1 - 1.0 K/uL   Eosinophils Relative 1 %   Eosinophils Absolute 0.1 0.0 - 0.5 K/uL   Basophils Relative 0 %   Basophils Absolute 0.0 0.0 - 0.1 K/uL   Immature Granulocytes 0 %   Abs Immature Granulocytes 0.02 0.00 - 0.07 K/uL    Comment: Performed at [facility], [address] [place], [place] [address]  Basic metabolic panel     Status: Abnormal   Collection Time: 12/02/21 12:01 AM  Result Value Ref Range   Sodium 141 135 - 145 mmol/L   Potassium 3.5 3.5 - 5.1 mmol/L   Chloride 113 (H) 98 - 111 mmol/L   CO2 20 (L) 22 - 32 mmol/L   Glucose, Bld 133 (H) 70 - 99 mg/dL    Comment: Glucose reference range applies only to samples taken after fasting for at least 8 hours.   BUN 26 (H) 6 - 20 mg/dL   Creatinine, Ser 3.71 (H) 0.61 - 1.24 mg/dL   Calcium 9.0 8.9 - 06.2 mg/dL   GFR, Estimated >69 >48 mL/min    Comment: (NOTE) Calculated using the CKD-EPI Creatinine Equation (2021)    Anion gap 8 5 - 15    Comment: Performed at [facility], [address] [place], [place] [address]  Brain natriuretic peptide     Status: Abnormal   Collection Time: 12/02/21 12:01 AM  Result Value Ref Range   B Natriuretic Peptide 3,097.3 (H) 0.0 - 100.0 pg/mL    Comment: Performed at [facility], [address] [place], [place] [address]  Troponin I (High Sensitivity)     Status: Abnormal   Collection Time: 12/02/21 12:01 AM  Result Value Ref Range   Troponin I (High Sensitivity) 54 (H) <18 ng/L    Comment: (NOTE) Elevated  high sensitivity troponin I (hsTnI) values and significant  changes across serial measurements may suggest ACS but many other  chronic and acute conditions are known to elevate hsTnI results.  Refer to the "Links" section for chest pain algorithms and additional  guidance. Performed at [facility], [address], [place], [place] [address]   Troponin I (High Sensitivity)     Status: Abnormal   Collection Time: 12/02/21  2:48 AM  Result Value Ref Range   Troponin I (High Sensitivity) 69 (H) <18 ng/L    Comment: (NOTE) Elevated high sensitivity troponin I (hsTnI) values and significant  changes across serial measurements may suggest ACS but many other  chronic and acute conditions are known to elevate hsTnI results.  Refer to the "Links" section for chest pain algorithms and additional  guidance. Performed at [facility], [address] [place], [place] [address]    DG Chest 2 View  Result Date: 12/02/2021 CLINICAL DATA:  Dyspnea EXAM: CHEST - 2 VIEW COMPARISON:  09/15/2021 FINDINGS: The lungs are symmetrically well expanded. No pneumothorax or pleural effusion. Moderate cardiomegaly appears slightly progressive in the interval since prior examination. There is increasing perihilar interstitial pulmonary infiltrate in keeping with progressive perihilar pulmonary edema as well as progressive central pulmonary vascular engorgement. No acute bone abnormality. IMPRESSION: Progressive cardiomegaly with development of  mild cardiogenic failure. Electronically Signed   By: Helyn Numbers M.D.   On: 12/02/2021 00:34    Pending Labs Unresulted Labs (From admission, onward)     Start     Ordered   12/03/21 0500  Basic metabolic panel  Daily at 5am,   R     Comments: As Scheduled for 5 days    12/02/21 0355   12/02/21 1400  Basic metabolic panel  Once,   R        12/02/21 0821   12/02/21 0807  Rapid urine drug screen (hospital performed)  Once,   R        12/02/21 0806            Vitals/Pain Today's Vitals   12/02/21 0740 12/02/21 0741 12/02/21 0927 12/02/21 0930  BP:   (!) 174/98 (!) 150/89  Pulse: 81  84 87  Resp: 19   18  Temp:  (!) 97.5 F (36.4 C)    TempSrc:  Oral    SpO2:    94%  Weight:      Height:      PainSc:        Isolation Precautions No active isolations  Medications Medications  albuterol (VENTOLIN HFA) 108 (90 Base) MCG/ACT inhaler 2 puff (has no administration in time range)  aspirin EC tablet 81 mg (81 mg Oral Given 12/02/21 0927)  sodium chloride flush (NS) 0.9 % injection 3 mL (3 mLs Intravenous Not Given 12/02/21 0928)  sodium chloride flush (NS) 0.9 % injection 3 mL (has no administration in time range)  0.9 %  sodium chloride infusion (has no administration in time range)  acetaminophen (TYLENOL) tablet 650 mg (has no administration in time range)  ondansetron (ZOFRAN) injection 4 mg (has no administration in time range)  heparin injection 5,000 Units (5,000 Units Subcutaneous Given 12/02/21 0620)  sodium chloride flush (NS) 0.9 % injection 3 mL (3 mLs Intravenous Not Given 12/02/21 0929)  sodium chloride flush (NS) 0.9 % injection 3 mL (has no administration in time range)  0.9 %  sodium chloride infusion (has no administration in time range)  spironolactone (ALDACTONE) tablet 12.5 mg (12.5 mg Oral Given 12/02/21 0927)  dapagliflozin propanediol (FARXIGA) tablet 10 mg (has no administration in time range)  metoprolol succinate (TOPROL-XL) 24 hr tablet 25 mg (25 mg  Oral Given 12/02/21 0927)  furosemide (LASIX) injection 40 mg (40 mg Intravenous Given 12/02/21 0836)  furosemide (LASIX) injection 20 mg (20 mg Intravenous Given 12/02/21 0251)  potassium chloride SA (KLOR-CON M) CR tablet 20 mEq (20 mEq Oral Given 12/02/21 0836)    Mobility walks Low fall risk   Focused Assessments Cardiac Assessment Handoff:  Cardiac Rhythm: Normal sinus rhythm Lab Results  Component Value Date   TROPONINI <0.03 12/19/2017   No results found for: "DDIMER" Does the Patient currently have chest pain? No    R Recommendations: See Admitting Provider Note  Report given to:   Additional Notes:

## 2021-12-02 NOTE — ED Notes (Signed)
This Paramedic spoke with the respiratory therapist regarding the pts  BIPAP order. Per the respiratory therapist the pt is refusing BIPAP .Respiratory therapist spoke with DR. Bero Who is aware the pt is refusing and is ok with not starting the treatment at this time. This paramedic will continue to monitor pt at this time.

## 2021-12-02 NOTE — Progress Notes (Signed)
Progress Note  Patient Name: Darius [name] Date of Encounter: 12/02/2021  [facility] Cardiologist: Bryan Lemma, MD   Subjective   Feels better this morning. Continues to have significant SOB with speaking and orthopnea. Cannot lay flat for cath today.  Admitted with acute on chronic systolic heart failure  Required BiPAP on admission now weaned off and on RA BP elevated 150/90s BNP 3097 Trop 54>69 Net negative 1400  Inpatient Medications    Scheduled Meds:  aspirin EC  81 mg Oral Daily   heparin  5,000 Units Subcutaneous Q8H   lisinopril  2.5 mg Oral Daily   sodium chloride flush  3 mL Intravenous Q12H   Continuous Infusions:  sodium chloride     PRN Meds: sodium chloride, acetaminophen, albuterol, ondansetron (ZOFRAN) IV, sodium chloride flush   Vital Signs    Vitals:   12/02/21 0245 12/02/21 0418 12/02/21 0422 12/02/21 0530  BP: (!) 151/91 (!) 146/86  (!) 144/86  Pulse: 88 90  84  Resp: 13 (!) 22  (!) 24  Temp:  97.8 F (36.6 C)    TempSrc:      SpO2: 98% 94%  90%  Weight:   68.9 kg   Height:   6\' 2"  (1.88 m)     Intake/Output Summary (Last 24 hours) at 12/02/2021 0648 Last data filed at 12/02/2021 02/01/2022 Gross per 24 hour  Intake --  Output 1400 ml  Net -1400 ml      12/02/2021    4:22 AM 11/29/2021   10:05 AM 11/28/2021   11:15 AM  Last 3 Weights  Weight (lbs) 152 lb 163 lb 154 lb 4.8 oz  Weight (kg) 68.947 kg 73.936 kg 69.99 kg      Telemetry    NSR - Personally Reviewed  ECG    NSR, LVH with strain - Personally Reviewed  Physical Exam   GEN: Sitting up in bed. Dyspneic with prolonged speaking Neck: JVD to angle of the mandible Cardiac: RRR, 2/6 holodiastolic murmur Respiratory: Bibasilar crackles with L>R GI: Soft, nontender, non-distended  MS: Trace edema, warm Neuro:  Nonfocal  Psych: Normal affect   Labs    High Sensitivity Troponin:   Recent Labs  Lab 12/02/21 0001 12/02/21 0248  TROPONINIHS 54* 69*     Chemistry Recent  Labs  Lab 12/02/21 0001  NA 141  K 3.5  CL 113*  CO2 20*  GLUCOSE 133*  BUN 26*  CREATININE 1.25*  CALCIUM 9.0  GFRNONAA >60  ANIONGAP 8    Lipids No results for input(s): "CHOL", "TRIG", "HDL", "LABVLDL", "LDLCALC", "CHOLHDL" in the last 168 hours.  Hematology Recent Labs  Lab 12/02/21 0001  WBC 5.6  RBC 4.11*  HGB 13.0  HCT 39.0  MCV 94.9  MCH 31.6  MCHC 33.3  RDW 12.6  PLT 203   Thyroid No results for input(s): "TSH", "FREET4" in the last 168 hours.  BNP Recent Labs  Lab 12/02/21 0001  BNP 3,097.3*    DDimer No results for input(s): "DDIMER" in the last 168 hours.   Radiology    DG Chest 2 View  Result Date: 12/02/2021 CLINICAL DATA:  Dyspnea EXAM: CHEST - 2 VIEW COMPARISON:  09/15/2021 FINDINGS: The lungs are symmetrically well expanded. No pneumothorax or pleural effusion. Moderate cardiomegaly appears slightly progressive in the interval since prior examination. There is increasing perihilar interstitial pulmonary infiltrate in keeping with progressive perihilar pulmonary edema as well as progressive central pulmonary vascular engorgement. No acute bone abnormality. IMPRESSION: Progressive  cardiomegaly with development of mild cardiogenic failure. Electronically Signed   By: Helyn Numbers M.D.   On: 12/02/2021 00:34    Cardiac Studies   CMR 11/08/21:   FINDINGS: Left ventricle:   -Severe dilatation   -Moderate systolic dysfunction   -Elevated ECV (34%)   -Normal T2 values   -RV insertion site LGE   LV EF: 31% (Normal 56-78%)   Absolute volumes:   LV EDV: (Normal 77-195 mL)   LV ESV: (Normal 19-72 mL)   LV SV: (Normal 51-133 mL)   CO: 10.4L/min (Normal 2.8-8.8 L/min)   Indexed volumes:   LV EDV: 270mL/sq-m (Normal 47-92 mL/sq-m)   LV ESV: 154mL/sq-m (Normal 13-30 mL/sq-m)   LV SV: 60mL/sq-m (Normal 32-62 mL/sq-m)   CI: 5.5L/min/sq-m (Normal 1.7-4.2 L/min/sq-m)   Right ventricle: Severe dilatation with moderate  systolic dysfunction   RV EF:  30% (Normal 47-74%)   Absolute volumes:   RV EDV: (Normal 88-227 mL)   RV ESV: (Normal 23-103 mL)   RV SV: 81mL (Normal 52-138 mL)   CO: 7.6L/min (Normal 2.8-8.8 L/min)   Indexed volumes:   RV EDV: 157mL/sq-m (Normal 55-105 mL/sq-m)   RV ESV: 128mL/sq-m (Normal 15-43 mL/sq-m)   RV SV: 26mL/sq-m (Normal 32-64 mL/sq-m)   CI: 4.0L/min/sq-m (Normal 1.7-4.2 L/min/sq-m)   Left atrium: Moderate enlargement   Right atrium: Mild enlargement   Mitral valve: Mild regurgitation   Aortic valve: Bicuspid aortic valve with fusion of right and noncoronary cusps. Severe regurgitation (regurgitant fraction 59%)   Tricuspid valve: Mild regurgitation   Pulmonic valve: No regurgitation   Aorta: Dilated ascending aorta measuring 58mm   Pericardium: Small effusion   IMPRESSION: 1. Bicuspid aortic valve with fusion of right and noncoronary cusps. Severe aortic regurgitation (regurgitant fraction 59%)   2.  Severe LV dilatation with moderate systolic dysfunction (EF 31%)   3.  Severe RV dilatation with moderate systolic dysfunction (EF 30%)   4. RV insertion site late gadolinium enhancement, which is a nonspecific finding often seen in setting of elevated pulmonary pressures   5. Dilated ascending aorta measuring 41mm  TTE 09/15/21: IMPRESSIONS     1. Bicuspid Aortic valve, No stenosis , Mild-Mod AI.   2. Bright peckled myocardium ,consider infiltrative process. Consider  Amyloid/Sarcoidosis.   3. Left ventricular ejection fraction, by estimation, is 40 to 45%. The  left ventricle has mildly decreased function. The left ventricle  demonstrates global hypokinesis. The left ventricular internal cavity size  was moderately to severely dilated. There   is mild concentric left ventricular hypertrophy. Left ventricular  diastolic parameters are consistent with Grade III diastolic dysfunction  (restrictive).   4. Right ventricular  systolic function is mildly reduced. The right  ventricular size is mildly enlarged. Mildly increased right ventricular  wall thickness.   5. Left atrial size was mildly dilated.   6. Right atrial size was mildly dilated.   7. The mitral valve is normal in structure. Trivial mitral valve  regurgitation.   8. The aortic valve is bicuspid. Aortic valve regurgitation is mild to  moderate. Aortic valve sclerosis is present, with no evidence of aortic  valve stenosis.   Patient Profile     51 y.o. male with history of bicuspid aortic valve with valvular cardiomyopathy, severe aortic insufficiency with biventricular failure, chronic systolic heart failure with EF 35%, chronic polysubstance abuse (tobacco, alcohol, marijuana, and occasional cocaine) who presents with worsening SOB, orthopnea and PND found to have acute on chronic  systolic heart failure exacerbation.  Assessment & Plan    #Acute on Chronic Systolic HF Exacerbation: #NICM: CMR with severe LV dilation, EF 31%, bicuspid AoV with severe AR, mild TR, mild MR, moderate RV systolic dysfunction. Thought to be due to valvular cardiomyopathy in the setting of severe AI, however, has not had ischemic work-up. Was planned for RHC/LHC as outpatient as part of surgical work-up for AVR but presented with worsening volume overload with BNP 3000. Likely acute exacerbation due to dietary noncompliance (has eaten at [facility] every morning for 1 week) and substance use (heavy alcohol use for past week). He received lasix 20mg  IV x1 dose with good UOP and has been weaned off BiPAP. Continues to be overloaded this AM. Will plan for continued diuresis today and RHC/LHC once more euvolemic. -Plan for RHC/LHC once euvolemic; possibly tomorrow vs Wednesday -Start lasix 40mg  IV BID -Start spironolactone 12.5mg  daily -Start farxiga 10mg  daily tomorrow if renal function stable -Hold lisinopril with plans to transition to entresto in 36 hours -Start low  dose metop 25mg  XL daily -Check PM BMET to ensure K stable -Monitor I/Os and daily weights -Discussed low Na diet at length; will need dietary counseling prior to discharge -Will continue surgical work-up for AVR as below  #Bicuspid AoV: #Severe AI: Patient with bicuspid aortic valve with severe AI. LV severely dilated with depressed EF at 31%. Undergoing surgical work-up but presented with worsening HF as detailed above. Will plan for pre-op LHC/RHC as inpatient. Will see CT surgery as outpatient for consideration of AVR once more clinically compensated. -Manage HF as above -Plan for RHC/LHC once euvolemic -Plan for CT surgery evaluation as outpatient  #Polysubstance Abuse: Admits to drinking, smoking marijuana and doing cocaine. States he is very motivated to quit and wants to ensure he is able to undergo surgery for his valve.   For questions or updates, please contact [facility] Please consult [email or web address] for contact info under        Signed, Tuesday, MD  12/02/2021, 6:48 AM

## 2021-12-02 NOTE — Progress Notes (Signed)
RT spoke with Pt. About wearing cpap or bipap but pt. Did not want to wear. Dr. Pilar Plate informed.

## 2021-12-02 NOTE — ED Notes (Signed)
Pt currently sleeping and dropping down to 88-89% on RA with his oxygen saturation. This RN placed pt on 2L Derma and will continue to monitor.

## 2021-12-02 NOTE — Evaluation (Addendum)
Occupational Therapy Evaluation and Discharge Patient Details Name: Darius [name] MRN: [id number] DOB: 1970-11-15 Today's Date: 12/02/2021   History of Present Illness Darius [name] is a 51 y.o. year-old male presenting to the ED with chief complaint of shortness of breath. On arrival, he was in mild respiratory distress and tachypneic. Admitted with acute on chronic systolic heart failure.Plan for RHC/LHC once euvolemic. PHMx: CHF, COP, Polysubstance Abuse   Clinical Impression   This 51 yo male admitted with above presents to acute OT at an independent level in his room, he is just very anxious. No current skilled OT needs identified, we will sign off. If there is a need after heart cath or if pt needs surgery and then there is a need, please re-order.     Recommendations for follow up therapy are one component of a multi-disciplinary discharge planning process, led by the attending physician.  Recommendations may be updated based on patient status, additional functional criteria and insurance authorization.   Follow Up Recommendations  No OT follow up    Assistance Recommended at Discharge PRN     Functional Status Assessment  Patient has not had a recent decline in their functional status  Equipment Recommendations  None recommended by OT       Precautions / Restrictions Precautions Precautions: None Restrictions Weight Bearing Restrictions: No      Mobility Bed Mobility Overal bed mobility: Independent                  Transfers Overall transfer level: Independent (in hospital room) Equipment used: None                      Balance Overall balance assessment: Independent                                         ADL either performed or assessed with clinical judgement   ADL Overall ADL's : Independent                                             Vision Baseline Vision/History: 0 No visual deficits               Pertinent Vitals/Pain Pain Assessment Pain Assessment: No/denies pain     Hand Dominance Right   Extremity/Trunk Assessment Upper Extremity Assessment Upper Extremity Assessment: Overall WFL for tasks assessed           Communication Communication Communication: No difficulties   Cognition Arousal/Alertness: Awake/alert Behavior During Therapy: Anxious                                   General Comments: anxious about breathing (sats 98-100% on RA), anxious about procedure tomorrow                Home Living Family/patient expects to be discharged to:: Private residence Living Arrangements: Alone;Spouse/significant other     Home Access: Stairs to enter Secretary/administrator of Steps: 3 in [place], 2 in [place] (no rails)         Bathroom Shower/Tub: Chief Strategy Officer: Standard     Home Equipment: None   Additional Comments: His wife normally lives in [place]  in their house since she works in [employer], but 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 Pt does not work      Prior Functioning/Environment Prior Level of Function : Independent/Modified Independent;Driving                                 OT Goals(Current goals can be found in the care plan section) Acute Rehab OT Goals Patient Stated Goal: to not be so anxious and have procedure done tomorrow about his heart         AM-PAC OT "6 Clicks" Daily Activity     Outcome Measure Help from another person eating meals?: None Help from another person taking care of personal grooming?: None Help from another person toileting, which includes using toliet, bedpan, or urinal?: None Help from another person bathing (including washing, rinsing, drying)?: None Help from another person to put on and taking off regular upper body clothing?: None Help from another person to put on and taking off regular lower body clothing?: None 6 Click Score:  24   End of Session Nurse Communication:  (RN was already aware he moved well due to he jumped up out of bed and hurried to chair in room due to anxiousness from laying in bed)  Activity Tolerance: Patient tolerated treatment well Patient left:  (sitting EOB)  OT Visit Diagnosis:  (anxiousness)                Time: 7544-9201 OT Time Calculation (min): 15 min Charges:  OT General Charges $OT Visit: 1 Visit OT Evaluation $OT Eval Low Complexity: 1 Low  Ignacia Palma, OTR/L Acute Rehab Services [facility] [phone] Office [phone]    Evette Georges 12/02/2021, 3:06 PM

## 2021-12-02 NOTE — ED Provider Notes (Signed)
[facility]PT [facility] Emergency Department Provider Note MRN:  [id number]  Arrival date & time: 12/02/21     Chief Complaint   Shortness of Breath   History of Present Illness   Darius [name] is a 51 y.o. year-old male with a history of CHF, COPD presenting to the ED with chief complaint of shortness of breath.  Worsening shortness of breath over the past few days, much worse this evening.  Could not lay flat.  No chest pain.  Increased fluid weight, increased swelling to the legs recently.  Tried an extra Lasix this evening but did not help.  Explains that he is scheduled for a heart catheterization and possibly surgery soon.  Review of Systems  A thorough review of systems was obtained and all systems are negative except as noted in the HPI and PMH.   Patient's Health History    Past Medical History:  Diagnosis Date   Aortic insufficiency due to bicuspid aortic valve 10/10/2021   Cardiac MRI 05/11/2021: Bicuspid aortic valve with fusion of the R & [place] cusps with severe AI (regurgitant fraction of 59%.   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  Severe AI 2/2 Bicusid AoV (R&[place] cusp fusion). Ascending Ao 42 mm   Valvular heart disease    a.) TTE on 12/19/2017 --> mild MV regurgitation; moderate AV regurgitation    Past Surgical History:  Procedure Laterality Date   Cardiac MRI  11/08/2021   Bicuspid aortic valve with fusion  of the right and noncoronary cusps-severe AI (regurgitant fraction 59%); Severe biventricular dilation with moderate dysfunction (LVEF estimated 39%, RVEF estimated 30%); ascending aorta measuring 42 mm.   CYSTOSCOPY/URETEROSCOPY/HOLMIUM LASER/STENT PLACEMENT Right 01/29/2021   Procedure: CYSTOSCOPY/URETEROSCOPY/HOLMIUM LASER/STENT PLACEMENT;  Surgeon: Riki Altes, MD;  Location: [facility] ORS;  Service: Urology;  Laterality: Right;   KIDNEY STONE SURGERY     05/2021   NO PAST SURGERIES      Family History  Problem Relation Age of Onset   Hypertension Mother    Kidney disease Mother    Diabetes Mother    Mitral valve prolapse Mother    Diabetes Father    Hypertension Maternal Grandmother    Mitral valve prolapse Maternal Grandmother    Other Half-Brother        accident    Social History   Socioeconomic History   Marital status: Legally Separated    Spouse name: Not on file   Number of children: Not on file   Years of education: Not on file   Highest education level: Not on file  Occupational History   Occupation: disability  Tobacco Use   Smoking status: Some Days    Packs/day: 0.25    Years: 35.00    Total pack years: 8.75    Types: Cigarettes    Last attempt to quit: 08/2020    Years since quitting: 1.2   Smokeless tobacco: Never  Tobacco comments:    Smokes 4-5 cigarettes per week.   Vaping Use   Vaping Use: Former   Quit date: 05/12/2021   Substances: Nicotine, Flavoring  Substance and Sexual Activity   Alcohol use: Yes    Alcohol/week: 2.0 standard drinks of alcohol    Types: 2 Cans of beer per week   Drug use: Yes    Types: Marijuana, Other-see comments, "Crack" cocaine    Comment: last use of crack cocaine 01/2021 current use of marijuana   Sexual activity: Yes  Other Topics Concern   Not on file  Social History Narrative      > From cardiology clinic note on October 10, 2021:      He is "doing the best he can "to cut out cigarettes and drugs.  He is down  to about 5 cigarettes a week-not buying any, and and only smoking but he can borrow.  However he was with some friends a couple days ago and was smoking some marijuana that "unbeknownst to him "was laced with "powder ".  He had a little bit of heart fast heart rates and some discomfort in his chest after smoking "blunt".   Social Determinants of Health   Financial Resource Strain: High Risk (10/31/2021)   Overall Financial Resource Strain (CARDIA)    Difficulty of Paying Living Expenses: Very hard  Food Insecurity: No Food Insecurity (10/31/2021)   Hunger Vital Sign    Worried About Running Out of Food in the Last Year: Never true    Ran Out of Food in the Last Year: Never true  Transportation Needs: Unmet Transportation Needs (11/11/2021)   PRAPARE - Administrator, Civil Service (Medical): Yes    Lack of Transportation (Non-Medical): No  Physical Activity: Unknown (03/01/2018)   Exercise Vital Sign    Days of Exercise per Week: 7 days    Minutes of Exercise per Session: Patient refused  Stress: Stress Concern Present (03/01/2018)   [employer] of Occupational Health - Occupational Stress Questionnaire    Feeling of Stress : Very much  Social Connections: Moderately Isolated (03/01/2018)   Social Connection and Isolation Panel [NHANES]    Frequency of Communication with Friends and Family: Never    Frequency of Social Gatherings with Friends and Family: Never    Attends Religious Services: Never    Database administrator or Organizations: No    Attends Banker Meetings: Never    Marital Status: Married  Catering manager Violence: Not At Risk (03/01/2018)   Humiliation, Afraid, Rape, and Kick questionnaire    Fear of Current or Ex-Partner: No    Emotionally Abused: No    Physically Abused: No    Sexually Abused: No     Physical Exam   Vitals:   12/01/21 2340 12/02/21 0134  BP: (!) 159/84 (!) 154/99  Pulse: 85 93  Resp: (!) 24 (!) 24  Temp: 97.7 F  (36.5 C)   SpO2: 95% 93%    CONSTITUTIONAL: Well-appearing, in mild respiratory distress NEURO/PSYCH:  Alert and oriented x 3, no focal deficits EYES:  eyes equal and reactive ENT/NECK:  no LAD, mild to moderate JVD noted CARDIO: Regular rate, well-perfused, normal S1 and S2 PULM: Tachypneic, decreased breath sounds at the bases GI/GU:  non-distended, non-tender MSK/SPINE:  No gross deformities, pitting edema to bilateral lower extremities SKIN:  no rash, atraumatic   *Additional and/or pertinent findings included in MDM below  Diagnostic and Interventional Summary  EKG Interpretation  Date/Time:  Sunday December 01 2021 23:47:10 EDT Ventricular Rate:  87 PR Interval:  160 QRS Duration: 96 QT Interval:  378 QTC Calculation: 454 R Axis:   57 Text Interpretation: Normal sinus rhythm Possible Left atrial enlargement Left ventricular hypertrophy with repolarization abnormality ( Sokolow-Lyon ) Abnormal ECG When compared with ECG of 15-Sep-2021 07:58, PREVIOUS ECG IS PRESENT Confirmed by Kennis Carina [phone]) on 12/01/2021 11:51:10 PM       Labs Reviewed  CBC WITH DIFFERENTIAL/PLATELET - Abnormal; Notable for the following components:      Result Value   RBC 4.11 (*)    All other components within normal limits  BASIC METABOLIC PANEL - Abnormal; Notable for the following components:   Chloride 113 (*)    CO2 20 (*)    Glucose, Bld 133 (*)    BUN 26 (*)    Creatinine, Ser 1.25 (*)    All other components within normal limits  BRAIN NATRIURETIC PEPTIDE - Abnormal; Notable for the following components:   B Natriuretic Peptide 3,097.3 (*)    All other components within normal limits  TROPONIN I (HIGH SENSITIVITY) - Abnormal; Notable for the following components:   Troponin I (High Sensitivity) 54 (*)    All other components within normal limits  TROPONIN I (HIGH SENSITIVITY)    DG Chest 2 View  Final Result      Medications  albuterol (VENTOLIN HFA) 108 (90 Base)  MCG/ACT inhaler 2 puff (has no administration in time range)  furosemide (LASIX) injection 20 mg (has no administration in time range)     Procedures  /  Critical Care .Critical Care  Performed by: Sabas Sous, MD Authorized by: Sabas Sous, MD   Critical care provider statement:    Critical care time (minutes):  35   Critical care was necessary to treat or prevent imminent or life-threatening deterioration of the following conditions: CHF exacerbation requiring noninvasive positive pressure ventilation.   Critical care was time spent personally by me on the following activities:  Development of treatment plan with patient or surrogate, discussions with consultants, evaluation of patient's response to treatment, examination of patient, ordering and review of laboratory studies, ordering and review of radiographic studies, ordering and performing treatments and interventions, pulse oximetry, re-evaluation of patient's condition and review of old charts   ED Course and Medical Decision Making  Initial Impression and Ddx Based on patient's history of CHF and his exam, notably the JVD, leg swelling, diminished breath sounds, CHF exacerbation is favored.  PE felt to be unlikely.  Patient has a COPD history as well and so this is a consideration.  No significant chest pain but an atypical presentation of ACS is also a possibility.  Awaiting EKG, labs, chest x-ray.  Given patient's tachypnea and general discomfort, will provide a trial of BiPAP.  Not hypoxic, on room air.  Hemodynamically doing well.  Providing Lasix.  Past medical/surgical history that increases complexity of ED encounter: CHF, COPD  Interpretation of Diagnostics I personally reviewed the EKG and my interpretation is as follows: Sinus rhythm, nonspecific findings largely unchanged from prior  Labs reveal no significant blood count or electrolyte disturbance.  Markedly elevated BNP, mildly elevated troponin  Patient  Reassessment and Ultimate Disposition/Management     Cardiology consulted for admission given that patient is scheduled for heart cath on 8-23 and possibly will undergo cardiac surgery soon.  Will admit to cardiology versus hospitalist depending on their recommendations.  Patient management  required discussion with the following services or consulting groups:  Cardiology  Complexity of Problems Addressed Chronic illness with severe exacerbation  Additional Data Reviewed and Analyzed Further history obtained from: Recent Consult notes  Additional Factors Impacting ED Encounter Risk Consideration of hospitalization  Elmer Sow. Pilar Plate, MD [facility] Emergency Medicine [facility] [email or web address]  Final Clinical Impressions(s) / ED Diagnoses     ICD-10-CM   1. Acute on chronic congestive heart failure, unspecified heart failure type (HCC)  I50.9       ED Discharge Orders     None        Discharge Instructions Discussed with and Provided to Patient:   Discharge Instructions   None      Sabas Sous, MD 12/02/21 [phone]

## 2021-12-02 NOTE — Evaluation (Signed)
Physical Therapy Evaluation and Discharge Patient Details Name: Darius [name] MRN: [id number] DOB: Jun 11, 1970 Today's Date: 12/02/2021  History of Present Illness  Darius [name] is a 51 y.o. year-old male presenting to the ED with chief complaint of shortness of breath. On arrival, he was in mild respiratory distress and tachypneic. Admitted with acute on chronic systolic heart failure.Plan for RHC/LHC once euvolemic. PHMx: CHF, COP, Polysubstance Abuse  Clinical Impression  Pt admitted secondary to problem above with deficits below. Pt very anxious and found standing in hall bent over. Gave pt RW and then practiced with rollator for increased safety. Pt able to practice sitting on rollator and reports he felt more comfortable using rollator. Recommending rollator for home to increase safety. No further skilled PT needs at this time. Will sign off. If needs develop following procedure, please re-order.        Recommendations for follow up therapy are one component of a multi-disciplinary discharge planning process, led by the attending physician.  Recommendations may be updated based on patient status, additional functional criteria and insurance authorization.  Follow Up Recommendations No PT follow up      Assistance Recommended at Discharge Intermittent Supervision/Assistance  Patient can return home with the following  Assistance with cooking/housework    Equipment Recommendations Rollator (4 wheels)  Recommendations for Other Services       Functional Status Assessment Patient has had a recent decline in their functional status and demonstrates the ability to make significant improvements in function in a reasonable and predictable amount of time.     Precautions / Restrictions Precautions Precautions: None Restrictions Weight Bearing Restrictions: No      Mobility  Bed Mobility               General bed mobility comments: In hallway upon entry    Transfers Overall  transfer level: Modified independent Equipment used: Rolling walker (2 wheels), Rollator (4 wheels)               General transfer comment: Pt standing in hallway bent over initially. Gave pt RW and pt reports increased comfort. Practiced with rollator second time, so pt could take rest breaks as needed. Reviewed locking brakes and practiced sitting on rollator.    Ambulation/Gait Ambulation/Gait assistance: Modified independent (Device/Increase time) Gait Distance (Feet): 125 Feet Assistive device: Rolling walker (2 wheels), Rollator (4 wheels) Gait Pattern/deviations: Step-through pattern, Decreased stride length Gait velocity: Decreased     General Gait Details: Improved comfort with use of RW. Had pt practice with rollator given fatigue and SOB, and pt reports he prefers rollator.  Stairs            Wheelchair Mobility    Modified Rankin (Stroke Patients Only)       Balance Overall balance assessment: No apparent balance deficits (not formally assessed)                                           Pertinent Vitals/Pain Pain Assessment Pain Assessment: No/denies pain    Home Living Family/patient expects to be discharged to:: Private residence Living Arrangements: Spouse/significant other Available Help at Discharge: Family Type of Home: House Home Access: Stairs to enter   Secretary/administrator of Steps: 3 in [place], 2 in [place] (no rails)   Home Layout: One level Home Equipment: None Additional Comments: His wife normally lives in [place] in their house  since she works in [place], but 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 Pt does not work    Prior Function Prior Level of Function : Independent/Modified Independent;Driving                     Higher education careers adviser   Dominant Hand: Right    Extremity/Trunk Assessment   Upper Extremity Assessment Upper Extremity Assessment: Defer to OT evaluation     Lower Extremity Assessment Lower Extremity Assessment: Overall WFL for tasks assessed    Cervical / Trunk Assessment Cervical / Trunk Assessment: Normal  Communication   Communication: No difficulties  Cognition Arousal/Alertness: Awake/alert Behavior During Therapy: Anxious Overall Cognitive Status: Within Functional Limits for tasks assessed                                 General Comments: Very anxious throughout about procedures.        General Comments      Exercises     Assessment/Plan    PT Assessment Patient does not need any further PT services  PT Problem List         PT Treatment Interventions      PT Goals (Current goals can be found in the Care Plan section)  Acute Rehab PT Goals Patient Stated Goal: to get procedures over with PT Goal Formulation: With patient Time For Goal Achievement: 12/02/21 Potential to Achieve Goals: Good    Frequency       Co-evaluation               AM-PAC PT "6 Clicks" Mobility  Outcome Measure Help needed turning from your back to your side while in a flat bed without using bedrails?: None Help needed moving from lying on your back to sitting on the side of a flat bed without using bedrails?: None Help needed moving to and from a bed to a chair (including a wheelchair)?: None Help needed standing up from a chair using your arms (e.g., wheelchair or bedside chair)?: None Help needed to walk in hospital room?: None Help needed climbing 3-5 steps with a railing? : A Little 6 Click Score: 23    End of Session   Activity Tolerance: Treatment limited secondary to medical complications (Comment) (anxiety) Patient left: in bed;with call bell/phone within reach;with family/visitor present;Other (comment) (with lab staff present) Nurse Communication: Mobility status PT Visit Diagnosis: Other abnormalities of gait and mobility (R26.89)    Time: 3329-5188 PT Time Calculation (min) (ACUTE ONLY): 14  min   Charges:   PT Evaluation $PT Eval Low Complexity: 1 Low          Cindee Salt, DPT  Acute Rehabilitation Services  Office: [phone]   Lehman Prom 12/02/2021, 4:18 PM

## 2021-12-02 NOTE — Progress Notes (Signed)
Progress Note  Patient Name: Darius [name] Date of Encounter: 12/03/2021  [facility] Cardiologist: Bryan Lemma, MD   Subjective   Comfortable this morning. Breathing significantly improved. States he can lay flat for the procedure.   Cr improved to 1.34 today  Net negative 3L Wt 152>147.8lbs  Inpatient Medications    Scheduled Meds:  aspirin EC  81 mg Oral Daily   furosemide  40 mg Intravenous BID   heparin  5,000 Units Subcutaneous Q8H   metoprolol succinate  25 mg Oral Daily   sodium chloride flush  3 mL Intravenous Q12H   sodium chloride flush  3 mL Intravenous Q12H   spironolactone  12.5 mg Oral Daily   Continuous Infusions:  sodium chloride     sodium chloride     sodium chloride     PRN Meds: sodium chloride, sodium chloride, acetaminophen, albuterol, LORazepam, ondansetron (ZOFRAN) IV, sodium chloride flush, sodium chloride flush   Vital Signs    Vitals:   12/02/21 1644 12/02/21 1925 12/02/21 2323 12/03/21 0432  BP: 125/82 117/71 120/86 126/81  Pulse: 81 74 75 71  Resp: 16 16 18 18   Temp: (!) 97.5 F (36.4 C) 98.5 F (36.9 C) 98.7 F (37.1 C) 97.9 F (36.6 C)  TempSrc: Oral Oral Oral Oral  SpO2: 98% 94% 99% 97%  Weight:    67 kg  Height:        Intake/Output Summary (Last 24 hours) at 12/03/2021 0638 Last data filed at 12/03/2021 0437 Gross per 24 hour  Intake 360 ml  Output 3400 ml  Net -3040 ml      12/03/2021    4:32 AM 12/02/2021    4:22 AM 11/29/2021   10:05 AM  Last 3 Weights  Weight (lbs) 147 lb 12.8 oz 152 lb 163 lb  Weight (kg) 67.042 kg 68.947 kg 73.936 kg      Telemetry    NSR - Personally Reviewed  ECG    12/02/21: NSR, LVH with strain- Personally Reviewed  Physical Exam   GEN: Laying in bed, comfortable Neck: JVD mildly elevated Cardiac: RRR, 2/6 holodiastolic murmur Respiratory: CTAB GI: Soft, nontender, non-distended  MS: Thin, warm, no edema Neuro:  Nonfocal  Psych: Normal affect   Labs    High Sensitivity  Troponin:   Recent Labs  Lab 12/02/21 0001 12/02/21 0248  TROPONINIHS 54* 69*     Chemistry Recent Labs  Lab 12/02/21 0001 12/02/21 1510  NA 141 141  K 3.5 4.2  CL 113* 109  CO2 20* 22  GLUCOSE 133* 97  BUN 26* 23*  CREATININE 1.25* 1.52*  CALCIUM 9.0 9.5  GFRNONAA >60 54*  ANIONGAP 8 10    Lipids No results for input(s): "CHOL", "TRIG", "HDL", "LABVLDL", "LDLCALC", "CHOLHDL" in the last 168 hours.  Hematology Recent Labs  Lab 12/02/21 0001  WBC 5.6  RBC 4.11*  HGB 13.0  HCT 39.0  MCV 94.9  MCH 31.6  MCHC 33.3  RDW 12.6  PLT 203   Thyroid No results for input(s): "TSH", "FREET4" in the last 168 hours.  BNP Recent Labs  Lab 12/02/21 0001  BNP 3,097.3*    DDimer No results for input(s): "DDIMER" in the last 168 hours.   Radiology    DG Chest 2 View  Result Date: 12/02/2021 CLINICAL DATA:  Dyspnea EXAM: CHEST - 2 VIEW COMPARISON:  09/15/2021 FINDINGS: The lungs are symmetrically well expanded. No pneumothorax or pleural effusion. Moderate cardiomegaly appears slightly progressive in the interval since  prior examination. There is increasing perihilar interstitial pulmonary infiltrate in keeping with progressive perihilar pulmonary edema as well as progressive central pulmonary vascular engorgement. No acute bone abnormality. IMPRESSION: Progressive cardiomegaly with development of mild cardiogenic failure. Electronically Signed   By: Helyn Numbers M.D.   On: 12/02/2021 00:34    Cardiac Studies   CMR 11/08/21:   FINDINGS: Left ventricle:   -Severe dilatation   -Moderate systolic dysfunction   -Elevated ECV (34%)   -Normal T2 values   -RV insertion site LGE   LV EF: 31% (Normal 56-78%)   Absolute volumes:   LV EDV: (Normal 77-195 mL)   LV ESV: (Normal 19-72 mL)   LV SV: (Normal 51-133 mL)   CO: 10.4L/min (Normal 2.8-8.8 L/min)   Indexed volumes:   LV EDV: 248mL/sq-m (Normal 47-92 mL/sq-m)   LV ESV: 187mL/sq-m (Normal  13-30 mL/sq-m)   LV SV: 34mL/sq-m (Normal 32-62 mL/sq-m)   CI: 5.5L/min/sq-m (Normal 1.7-4.2 L/min/sq-m)   Right ventricle: Severe dilatation with moderate systolic dysfunction   RV EF:  30% (Normal 47-74%)   Absolute volumes:   RV EDV: (Normal 88-227 mL)   RV ESV: (Normal 23-103 mL)   RV SV: 1mL (Normal 52-138 mL)   CO: 7.6L/min (Normal 2.8-8.8 L/min)   Indexed volumes:   RV EDV: 187mL/sq-m (Normal 55-105 mL/sq-m)   RV ESV: 11mL/sq-m (Normal 15-43 mL/sq-m)   RV SV: 70mL/sq-m (Normal 32-64 mL/sq-m)   CI: 4.0L/min/sq-m (Normal 1.7-4.2 L/min/sq-m)   Left atrium: Moderate enlargement   Right atrium: Mild enlargement   Mitral valve: Mild regurgitation   Aortic valve: Bicuspid aortic valve with fusion of right and noncoronary cusps. Severe regurgitation (regurgitant fraction 59%)   Tricuspid valve: Mild regurgitation   Pulmonic valve: No regurgitation   Aorta: Dilated ascending aorta measuring 43mm   Pericardium: Small effusion   IMPRESSION: 1. Bicuspid aortic valve with fusion of right and noncoronary cusps. Severe aortic regurgitation (regurgitant fraction 59%)   2.  Severe LV dilatation with moderate systolic dysfunction (EF 31%)   3.  Severe RV dilatation with moderate systolic dysfunction (EF 30%)   4. RV insertion site late gadolinium enhancement, which is a nonspecific finding often seen in setting of elevated pulmonary pressures   5. Dilated ascending aorta measuring 38mm  TTE 09/15/21: IMPRESSIONS     1. Bicuspid Aortic valve, No stenosis , Mild-Mod AI.   2. Bright peckled myocardium ,consider infiltrative process. Consider  Amyloid/Sarcoidosis.   3. Left ventricular ejection fraction, by estimation, is 40 to 45%. The  left ventricle has mildly decreased function. The left ventricle  demonstrates global hypokinesis. The left ventricular internal cavity size  was moderately to severely dilated. There   is mild concentric left  ventricular hypertrophy. Left ventricular  diastolic parameters are consistent with Grade III diastolic dysfunction  (restrictive).   4. Right ventricular systolic function is mildly reduced. The right  ventricular size is mildly enlarged. Mildly increased right ventricular  wall thickness.   5. Left atrial size was mildly dilated.   6. Right atrial size was mildly dilated.   7. The mitral valve is normal in structure. Trivial mitral valve  regurgitation.   8. The aortic valve is bicuspid. Aortic valve regurgitation is mild to  moderate. Aortic valve sclerosis is present, with no evidence of aortic  valve stenosis.   Patient Profile     51 y.o. male with history of bicuspid aortic valve with valvular cardiomyopathy, severe aortic insufficiency with biventricular failure,  chronic systolic heart failure with EF 35%, chronic polysubstance abuse (tobacco, alcohol, marijuana, and occasional cocaine) who presents with worsening SOB, orthopnea and PND found to have acute on chronic systolic heart failure exacerbation.  Assessment & Plan    #Acute on Chronic Systolic HF Exacerbation: #NICM: CMR with severe LV dilation, EF 31%, bicuspid AoV with severe AR, mild TR, mild MR, moderate RV systolic dysfunction. Thought to be due to valvular cardiomyopathy in the setting of severe AI, however, has not had ischemic work-up. Was planned for RHC/LHC as outpatient as part of surgical work-up for AVR but presented with worsening volume overload with BNP 3000. Likely acute exacerbation due to dietary noncompliance (has eaten at [facility] every morning for 1 week) and substance use (heavy alcohol use for past week). Responded well to diuresis with significant improvement in volume status. Plan for RHC/LHC today. -Plan for RHC/LHC today -Resume lasix as needed pending RHC numbers -Continue spironolactone 12.5mg  daily -Start farxiga 10mg  daily tomorrow as able -Hold lisinopril with plans to transition to  entresto in 36 hours (hopefully tomorrow pending renal function) -Continue low dose metop 25mg  XL daily -Monitor I/Os and daily weights -Discussed low Na diet at length; will need dietary counseling prior to discharge -Will continue surgical work-up for AVR as below  #Bicuspid AoV: #Severe AI: Patient with bicuspid aortic valve with severe AI. LV severely dilated with depressed EF at 31%. Undergoing surgical work-up but presented with worsening HF as detailed above. Will plan for pre-op LHC/RHC as inpatient. Will see CT surgery as outpatient for consideration of AVR once more clinically compensated. -Manage HF as above -Plan for RHC/LHC today -Plan for CT surgery evaluation as outpatient  #Polysubstance Abuse: Admits to drinking, smoking marijuana and doing cocaine. States he is very motivated to quit and wants to ensure he is able to undergo surgery for his valve.   INFORMED CONSENT: I have reviewed the risks, indications, and alternatives to cardiac catheterization, possible angioplasty, and stenting with the patient. Risks include but are not limited to bleeding, infection, vascular injury, stroke, myocardial infection, arrhythmia, kidney injury, radiation-related injury in the case of prolonged fluoroscopy use, emergency cardiac surgery, and death. The patient understands the risks of serious complication is 1-2 in 1000 with diagnostic cardiac cath and 1-2% or less with angioplasty/stenting.    For questions or updates, please contact [facility] Please consult [email or web address] for contact info under        Signed, , MD  12/03/2021, 6:38 AM

## 2021-12-03 ENCOUNTER — Inpatient Hospital Stay (HOSPITAL_COMMUNITY)
Admission: EM | Disposition: A | Discharge status: Home / Self Care | Payer: Self-pay | Source: Home / Self Care | Attending: Cardiology

## 2021-12-03 DIAGNOSIS — I5022 Chronic systolic (congestive) heart failure: Secondary | ICD-10-CM

## 2021-12-03 DIAGNOSIS — I509 Heart failure, unspecified: Secondary | ICD-10-CM

## 2021-12-03 HISTORY — PX: RIGHT/LEFT HEART CATH AND CORONARY ANGIOGRAPHY: CATH118266

## 2021-12-03 LAB — POCT I-STAT 7, (LYTES, BLD GAS, ICA,H+H)
Acid-Base Excess: 2 mmol/L (ref 0.0–2.0)
Bicarbonate: 27.4 mmol/L (ref 20.0–28.0)
Calcium, Ion: 1.3 mmol/L (ref 1.15–1.40)
HCT: 40 % (ref 39.0–52.0)
Hemoglobin: 13.6 g/dL (ref 13.0–17.0)
O2 Saturation: 99 %
Potassium: 4.4 mmol/L (ref 3.5–5.1)
Sodium: 139 mmol/L (ref 135–145)
TCO2: 29 mmol/L (ref 22–32)
pCO2 arterial: 45.2 mmHg (ref 32–48)
pH, Arterial: 7.39 (ref 7.35–7.45)
pO2, Arterial: 168 mmHg — ABNORMAL HIGH (ref 83–108)

## 2021-12-03 LAB — POCT I-STAT EG7
Acid-Base Excess: 4 mmol/L — ABNORMAL HIGH (ref 0.0–2.0)
Acid-Base Excess: 3 mmol/L — ABNORMAL HIGH (ref 0.0–2.0)
Bicarbonate: 29.4 mmol/L — ABNORMAL HIGH (ref 20.0–28.0)
Bicarbonate: 29.3 mmol/L — ABNORMAL HIGH (ref 20.0–28.0)
Calcium, Ion: 1.29 mmol/L (ref 1.15–1.40)
Calcium, Ion: 1.28 mmol/L (ref 1.15–1.40)
HCT: 40 % (ref 39.0–52.0)
HCT: 40 % (ref 39.0–52.0)
Hemoglobin: 13.6 g/dL (ref 13.0–17.0)
Hemoglobin: 13.6 g/dL (ref 13.0–17.0)
O2 Saturation: 64 %
O2 Saturation: 65 %
Potassium: 4.4 mmol/L (ref 3.5–5.1)
Potassium: 4.3 mmol/L (ref 3.5–5.1)
Sodium: 141 mmol/L (ref 135–145)
Sodium: 142 mmol/L (ref 135–145)
TCO2: 31 mmol/L (ref 22–32)
TCO2: 31 mmol/L (ref 22–32)
pCO2, Ven: 47.6 mmHg (ref 44–60)
pCO2, Ven: 49 mmHg (ref 44–60)
pH, Ven: 7.398 (ref 7.25–7.43)
pH, Ven: 7.384 (ref 7.25–7.43)
pO2, Ven: 34 mmHg (ref 32–45)
pO2, Ven: 35 mmHg (ref 32–45)

## 2021-12-03 LAB — BASIC METABOLIC PANEL
Anion gap: 7 (ref 5–15)
BUN: 21 mg/dL — ABNORMAL HIGH (ref 6–20)
CO2: 27 mmol/L (ref 22–32)
Calcium: 9.3 mg/dL (ref 8.9–10.3)
Chloride: 107 mmol/L (ref 98–111)
Creatinine, Ser: 1.34 mg/dL — ABNORMAL HIGH (ref 0.61–1.24)
GFR, Estimated: >60 mL/min (ref >60)
Glucose, Bld: 92 mg/dL (ref 70–99)
Potassium: 4 mmol/L (ref 3.5–5.1)
Sodium: 141 mmol/L (ref 135–145)

## 2021-12-03 LAB — CBC
HCT: 39.5 % (ref 39.0–52.0)
Hemoglobin: 13.4 g/dL (ref 13.0–17.0)
MCH: 31.5 pg (ref 26.0–34.0)
MCHC: 33.9 g/dL (ref 30.0–36.0)
MCV: 92.7 fL (ref 80.0–100.0)
Platelets: 220 10*3/uL (ref 150–400)
RBC: 4.26 MIL/uL (ref 4.22–5.81)
RDW: 12.4 % (ref 11.5–15.5)
WBC: 4.9 10*3/uL (ref 4.0–10.5)
nRBC: 0 % (ref 0.0–0.2)

## 2021-12-03 SURGERY — RIGHT/LEFT HEART CATH AND CORONARY ANGIOGRAPHY
Anesthesia: LOCAL

## 2021-12-03 MED ORDER — IOHEXOL 350 MG/ML SOLN
INTRAVENOUS | Status: DC | PRN
  Administered 2021-12-03: 55 mL via INTRA_ARTERIAL

## 2021-12-03 MED ORDER — HYDRALAZINE HCL 20 MG/ML IJ SOLN: 10.0000 mg | INTRAMUSCULAR | Status: AC | PRN

## 2021-12-03 MED ORDER — HEPARIN SODIUM (PORCINE) 5000 UNIT/ML IJ SOLN
5000.0000 [IU] | Freq: Three times a day (TID) | INTRAMUSCULAR | Status: DC
  Administered 2021-12-03 – 2021-12-04 (×3): 5000 [IU] via SUBCUTANEOUS
  Filled 2021-12-03 (×4): qty 1

## 2021-12-03 MED ORDER — MIDAZOLAM HCL 2 MG/2ML IJ SOLN
INTRAMUSCULAR | Status: DC | PRN
  Administered 2021-12-03: .5 mg via INTRAVENOUS

## 2021-12-03 MED ORDER — ACETAMINOPHEN 325 MG PO TABS: 650.0000 mg | ORAL_TABLET | ORAL | Status: DC | PRN

## 2021-12-03 MED ORDER — HEPARIN SODIUM (PORCINE) 1000 UNIT/ML IJ SOLN
INTRAMUSCULAR | Status: AC
  Filled 2021-12-03: qty 10

## 2021-12-03 MED ORDER — VERAPAMIL HCL 2.5 MG/ML IV SOLN
INTRAVENOUS | Status: AC
  Filled 2021-12-03: qty 2

## 2021-12-03 MED ORDER — SODIUM CHLORIDE 0.9 % IV SOLN: INTRAVENOUS | Status: AC

## 2021-12-03 MED ORDER — SODIUM CHLORIDE 0.9% FLUSH
3.0000 mL | Freq: Two times a day (BID) | INTRAVENOUS | Status: DC
  Administered 2021-12-05: 3 mL via INTRAVENOUS

## 2021-12-03 MED ORDER — SODIUM CHLORIDE 0.9 % IV SOLN: 250.0000 mL | INTRAVENOUS | Status: DC | PRN

## 2021-12-03 MED ORDER — FUROSEMIDE 10 MG/ML IJ SOLN
40.0000 mg | Freq: Two times a day (BID) | INTRAMUSCULAR | Status: DC
  Administered 2021-12-03 – 2021-12-04 (×2): 40 mg via INTRAVENOUS
  Filled 2021-12-03: qty 4

## 2021-12-03 MED ORDER — MIDAZOLAM HCL 2 MG/2ML IJ SOLN
INTRAMUSCULAR | Status: AC
  Filled 2021-12-03: qty 2

## 2021-12-03 MED ORDER — HEPARIN (PORCINE) IN NACL 1000-0.9 UT/500ML-% IV SOLN
INTRAVENOUS | Status: AC
  Filled 2021-12-03: qty 1000

## 2021-12-03 MED ORDER — OXYCODONE HCL 5 MG PO TABS: 5.0000 mg | ORAL_TABLET | ORAL | Status: DC | PRN

## 2021-12-03 MED ORDER — FENTANYL CITRATE (PF) 100 MCG/2ML IJ SOLN
INTRAMUSCULAR | Status: DC | PRN
Start: 2021-12-03 — End: 2021-12-03
  Administered 2021-12-03: 25 ug via INTRAVENOUS

## 2021-12-03 MED ORDER — ONDANSETRON HCL 4 MG/2ML IJ SOLN: 4.0000 mg | Freq: Four times a day (QID) | INTRAMUSCULAR | Status: DC | PRN

## 2021-12-03 MED ORDER — LIDOCAINE HCL (PF) 1 % IJ SOLN
INTRAMUSCULAR | Status: DC | PRN
  Administered 2021-12-03 (×2): 2 mL via INTRADERMAL

## 2021-12-03 MED ORDER — SODIUM CHLORIDE 0.9% FLUSH: 3.0000 mL | INTRAVENOUS | Status: DC | PRN

## 2021-12-03 MED ORDER — SODIUM CHLORIDE 0.9 % IV SOLN: INTRAVENOUS | Status: DC

## 2021-12-03 MED ORDER — LIDOCAINE HCL (PF) 1 % IJ SOLN
INTRAMUSCULAR | Status: AC
  Filled 2021-12-03: qty 30

## 2021-12-03 MED ORDER — FENTANYL CITRATE (PF) 100 MCG/2ML IJ SOLN
INTRAMUSCULAR | Status: AC
  Filled 2021-12-03: qty 2

## 2021-12-03 MED ORDER — LABETALOL HCL 5 MG/ML IV SOLN: 10.0000 mg | INTRAVENOUS | Status: AC | PRN

## 2021-12-03 SURGICAL SUPPLY — 14 items
BAND ZEPHYR COMPRESS 30 LONG (HEMOSTASIS) ×1 IMPLANT
CATH 5FR JL3.5 JR4 ANG PIG MP (CATHETERS) ×1 IMPLANT
CATH BALLN WEDGE 5F 110CM (CATHETERS) ×1 IMPLANT
CATH INFINITI 5FR JL4 (CATHETERS) ×1 IMPLANT
CATH INFINITI 5FR JL5 (CATHETERS) ×1 IMPLANT
GLIDESHEATH SLEND A-KIT 6F 22G (SHEATH) ×1 IMPLANT
GUIDEWIRE INQWIRE 1.5J.035X260 (WIRE) IMPLANT
INQWIRE 1.5J .035X260CM (WIRE) ×2
KIT HEART LEFT (KITS) ×2 IMPLANT
PACK CARDIAC CATHETERIZATION (CUSTOM PROCEDURE TRAY) ×2 IMPLANT
SHEATH GLIDE SLENDER 4/5FR (SHEATH) ×1 IMPLANT
SHEATH PROBE COVER 6X72 (BAG) ×1 IMPLANT
TRANSDUCER W/STOPCOCK (MISCELLANEOUS) ×2 IMPLANT
TUBING CIL FLEX 10 FLL-RA (TUBING) ×2 IMPLANT

## 2021-12-03 NOTE — Progress Notes (Signed)
Pt presenting to the ED with chief complaint of shortness of breath. On arrival, he was in mild respiratory distress and tachypneic. Admitted with acute on chronic systolic heart failure. Polysubstance Abuse. Pt does not have current insurance. TOC will follow for potential DC needs.

## 2021-12-03 NOTE — Progress Notes (Signed)
Pt doesn't want to wear CPAP for the night. 

## 2021-12-03 NOTE — Interval H&P Note (Signed)
Cath Lab Visit (complete for each Cath Lab visit)  Clinical Evaluation Leading to the Procedure:   ACS: No.  Non-ACS:    Anginal Classification: CCS IV  Anti-ischemic medical therapy: No Therapy  Non-Invasive Test Results: High-risk stress test findings: cardiac mortality >3%/year  Prior CABG: No previous CABG      History and Physical Interval Note:  12/03/2021 2:14 PM  Darius [name]  has presented today for surgery, with the diagnosis of unstable angina.  The various methods of treatment have been discussed with the patient and family. After consideration of risks, benefits and other options for treatment, the patient has consented to  Procedure(s): RIGHT/LEFT HEART CATH AND CORONARY ANGIOGRAPHY (N/A) as a surgical intervention.  The patient's history has been reviewed, patient examined, no change in status, stable for surgery.  I have reviewed the patient's chart and labs.  Questions were answered to the patient's satisfaction.     Lyn Records III

## 2021-12-04 ENCOUNTER — Encounter (HOSPITAL_COMMUNITY): Payer: Self-pay | Admitting: Interventional Cardiology

## 2021-12-04 ENCOUNTER — Other Ambulatory Visit: Payer: Self-pay

## 2021-12-04 DIAGNOSIS — I5033 Acute on chronic diastolic (congestive) heart failure: Secondary | ICD-10-CM

## 2021-12-04 DIAGNOSIS — I5042 Chronic combined systolic (congestive) and diastolic (congestive) heart failure: Secondary | ICD-10-CM

## 2021-12-04 LAB — BASIC METABOLIC PANEL
Anion gap: 10 (ref 5–15)
BUN: 26 mg/dL — ABNORMAL HIGH (ref 6–20)
CO2: 24 mmol/L (ref 22–32)
Calcium: 9.2 mg/dL (ref 8.9–10.3)
Chloride: 104 mmol/L (ref 98–111)
Creatinine, Ser: 1.34 mg/dL — ABNORMAL HIGH (ref 0.61–1.24)
GFR, Estimated: >60 mL/min (ref >60)
Glucose, Bld: 97 mg/dL (ref 70–99)
Potassium: 3.8 mmol/L (ref 3.5–5.1)
Sodium: 138 mmol/L (ref 135–145)

## 2021-12-04 MED ORDER — SACUBITRIL-VALSARTAN 24-26 MG PO TABS
1.0000 | ORAL_TABLET | Freq: Two times a day (BID) | ORAL | Status: DC
Start: 2021-12-04 — End: 2021-12-05
  Administered 2021-12-04 – 2021-12-05 (×3): 1 via ORAL
  Filled 2021-12-04 (×3): qty 1

## 2021-12-04 MED ORDER — FUROSEMIDE 10 MG/ML IJ SOLN
40.0000 mg | Freq: Two times a day (BID) | INTRAMUSCULAR | Status: DC
Start: 2021-12-04 — End: 2021-12-04
  Filled 2021-12-04: qty 4

## 2021-12-04 MED ORDER — DAPAGLIFLOZIN PROPANEDIOL 10 MG PO TABS
10.0000 mg | ORAL_TABLET | Freq: Every day | ORAL | Status: DC
  Filled 2021-12-04: qty 1

## 2021-12-04 MED FILL — Verapamil HCl IV Soln 2.5 MG/ML: INTRAVENOUS | Qty: 2 | Status: AC

## 2021-12-04 MED FILL — Heparin Sod (Porcine)-NaCl IV Soln 1000 Unit/500ML-0.9%: INTRAVENOUS | Qty: 1000 | Status: AC

## 2021-12-04 MED FILL — Heparin Sodium (Porcine) Inj 1000 Unit/ML: INTRAMUSCULAR | Qty: 10 | Status: AC

## 2021-12-04 NOTE — Progress Notes (Signed)
Progress Note  Patient Name: Darius [name] Date of Encounter: 12/04/2021  [facility] Cardiologist: Bryan Lemma, MD   Subjective   ***  Inpatient Medications    Scheduled Meds:  aspirin EC  81 mg Oral Daily   heparin  5,000 Units Subcutaneous Q8H   metoprolol succinate  25 mg Oral Daily   sacubitril-valsartan  1 tablet Oral BID   sodium chloride flush  3 mL Intravenous Q12H   sodium chloride flush  3 mL Intravenous Q12H   spironolactone  12.5 mg Oral Daily   Continuous Infusions:  sodium chloride     sodium chloride     PRN Meds: sodium chloride, sodium chloride, acetaminophen, albuterol, LORazepam, ondansetron (ZOFRAN) IV, oxyCODONE, sodium chloride flush, sodium chloride flush   Vital Signs    Vitals:   12/04/21 0410 12/04/21 0908 12/04/21 1440 12/04/21 1941  BP: 115/72  (!) 95/56 97/64  Pulse: 82  74 68  Resp: 20  20 18   Temp: 97.7 F (36.5 C) 98.6 F (37 C) 97.7 F (36.5 C) 97.9 F (36.6 C)  TempSrc: Oral Oral Oral Oral  SpO2: 100% 95% 100% 99%  Weight: 65.7 kg     Height:        Intake/Output Summary (Last 24 hours) at 12/04/2021 2057 Last data filed at 12/04/2021 1700 Gross per 24 hour  Intake 720 ml  Output 2700 ml  Net -1980 ml       12/04/2021    4:10 AM 12/03/2021    4:32 AM 12/02/2021    4:22 AM  Last 3 Weights  Weight (lbs) 144 lb 12.8 oz 147 lb 12.8 oz 152 lb  Weight (kg) 65.681 kg 67.042 kg 68.947 kg      Telemetry    NSR - Personally Reviewed  ECG    No new tracing- Personally Reviewed  Physical Exam   GEN: Laying in bed, comfortable Neck: No significant JVD Cardiac: RRR, 2/6 holodiastolic murmur Respiratory: CTAB GI: Soft, nontender, non-distended  MS: Thin, warm, no edema Neuro:  Nonfocal  Psych: Normal affect   Labs    High Sensitivity Troponin:   Recent Labs  Lab 12/02/21 0001 12/02/21 0248  TROPONINIHS 54* 69*      Chemistry Recent Labs  Lab 12/02/21 1510 12/03/21 0703 12/03/21 1458 12/03/21 1459  12/03/21 1509 12/04/21 0406  NA 141 141   < > 141 139 138  K 4.2 4.0   < > 4.4 4.4 3.8  CL 109 107  --   --   --  104  CO2 22 27  --   --   --  24  GLUCOSE 97 92  --   --   --  97  BUN 23* 21*  --   --   --  26*  CREATININE 1.52* 1.34*  --   --   --  1.34*  CALCIUM 9.5 9.3  --   --   --  9.2  GFRNONAA 54* >60  --   --   --  >60  ANIONGAP 10 7  --   --   --  10   < > = values in this interval not displayed.     Lipids No results for input(s): "CHOL", "TRIG", "HDL", "LABVLDL", "LDLCALC", "CHOLHDL" in the last 168 hours.  Hematology Recent Labs  Lab 12/02/21 0001 12/03/21 0703 12/03/21 1458 12/03/21 1459 12/03/21 1509  WBC 5.6 4.9  --   --   --   RBC 4.11* 4.26  --   --   --  HGB 13.0 13.4 13.6 13.6 13.6  HCT 39.0 39.5 40.0 40.0 40.0  MCV 94.9 92.7  --   --   --   MCH 31.6 31.5  --   --   --   MCHC 33.3 33.9  --   --   --   RDW 12.6 12.4  --   --   --   PLT 203 220  --   --   --     Thyroid No results for input(s): "TSH", "FREET4" in the last 168 hours.  BNP Recent Labs  Lab 12/02/21 0001  BNP 3,097.3*     DDimer No results for input(s): "DDIMER" in the last 168 hours.   Radiology    CARDIAC CATHETERIZATION  Result Date: 12/03/2021 CONCLUSIONS: Left dominant normal coronary arteries. LVEDP 41 mmHg.  Aortic pressure 114/62 mmHg. Mild pulmonary hypertension, mean 33 mmHg.  Mean capillary wedge pressure 26 mmHg.  WHO group 2 etiology. Cardiac output 4.03 L/min with index 2.08. Normal right heart pressures. RECOMMENDATIONS: Still volume overloaded Afterload reduction may be helpful Heart failure, decline in EF, make aortic valve replacement necessary.    Cardiac Studies   RHC/LHC 12/03/21: CONCLUSIONS: Left dominant normal coronary arteries. LVEDP 41 mmHg.  Aortic pressure 114/62 mmHg. Mild pulmonary hypertension, mean 33 mmHg.  Mean capillary wedge pressure 26 mmHg.  WHO group 2 etiology. Cardiac output 4.03 L/min with index 2.08. Normal right heart pressures.    RECOMMENDATIONS: Still volume overloaded Afterload reduction may be helpful Heart failure, decline in EF, make aortic valve replacement necessary.    CMR 11/08/21:   FINDINGS: Left ventricle:   -Severe dilatation   -Moderate systolic dysfunction   -Elevated ECV (34%)   -Normal T2 values   -RV insertion site LGE   LV EF: 31% (Normal 56-78%)   Absolute volumes:   LV EDV: (Normal 77-195 mL)   LV ESV: (Normal 19-72 mL)   LV SV: (Normal 51-133 mL)   CO: 10.4L/min (Normal 2.8-8.8 L/min)   Indexed volumes:   LV EDV: 289mL/sq-m (Normal 47-92 mL/sq-m)   LV ESV: 154mL/sq-m (Normal 13-30 mL/sq-m)   LV SV: 14mL/sq-m (Normal 32-62 mL/sq-m)   CI: 5.5L/min/sq-m (Normal 1.7-4.2 L/min/sq-m)   Right ventricle: Severe dilatation with moderate systolic dysfunction   RV EF:  30% (Normal 47-74%)   Absolute volumes:   RV EDV: (Normal 88-227 mL)   RV ESV: (Normal 23-103 mL)   RV SV: 40mL (Normal 52-138 mL)   CO: 7.6L/min (Normal 2.8-8.8 L/min)   Indexed volumes:   RV EDV: 114mL/sq-m (Normal 55-105 mL/sq-m)   RV ESV: 135mL/sq-m (Normal 15-43 mL/sq-m)   RV SV: 7mL/sq-m (Normal 32-64 mL/sq-m)   CI: 4.0L/min/sq-m (Normal 1.7-4.2 L/min/sq-m)   Left atrium: Moderate enlargement   Right atrium: Mild enlargement   Mitral valve: Mild regurgitation   Aortic valve: Bicuspid aortic valve with fusion of right and noncoronary cusps. Severe regurgitation (regurgitant fraction 59%)   Tricuspid valve: Mild regurgitation   Pulmonic valve: No regurgitation   Aorta: Dilated ascending aorta measuring 49mm   Pericardium: Small effusion   IMPRESSION: 1. Bicuspid aortic valve with fusion of right and noncoronary cusps. Severe aortic regurgitation (regurgitant fraction 59%)   2.  Severe LV dilatation with moderate systolic dysfunction (EF 31%)   3.  Severe RV dilatation with moderate systolic dysfunction (EF 30%)   4. RV insertion site late  gadolinium enhancement, which is a nonspecific finding often seen in setting of elevated pulmonary pressures  5. Dilated ascending aorta measuring 73mm  TTE 09/15/21: IMPRESSIONS     1. Bicuspid Aortic valve, No stenosis , Mild-Mod AI.   2. Bright peckled myocardium ,consider infiltrative process. Consider  Amyloid/Sarcoidosis.   3. Left ventricular ejection fraction, by estimation, is 40 to 45%. The  left ventricle has mildly decreased function. The left ventricle  demonstrates global hypokinesis. The left ventricular internal cavity size  was moderately to severely dilated. There   is mild concentric left ventricular hypertrophy. Left ventricular  diastolic parameters are consistent with Grade III diastolic dysfunction  (restrictive).   4. Right ventricular systolic function is mildly reduced. The right  ventricular size is mildly enlarged. Mildly increased right ventricular  wall thickness.   5. Left atrial size was mildly dilated.   6. Right atrial size was mildly dilated.   7. The mitral valve is normal in structure. Trivial mitral valve  regurgitation.   8. The aortic valve is bicuspid. Aortic valve regurgitation is mild to  moderate. Aortic valve sclerosis is present, with no evidence of aortic  valve stenosis.   Patient Profile     51 y.o. male with history of bicuspid aortic valve with valvular cardiomyopathy, severe aortic insufficiency with biventricular failure, chronic systolic heart failure with EF 35%, chronic polysubstance abuse (tobacco, alcohol, marijuana, and occasional cocaine) who presents with worsening SOB, orthopnea and PND found to have acute on chronic systolic heart failure exacerbation.  Assessment & Plan    #Acute on Chronic Systolic HF Exacerbation: #NICM: CMR with severe LV dilation, EF 31%, bicuspid AoV with severe AR, mild TR, mild MR, moderate RV systolic dysfunction. Thought to be due to valvular cardiomyopathy in the setting of severe AI.  Was planned for RHC/LHC as outpatient as part of surgical work-up for AVR but presented with worsening volume overload with BNP 3000. Likely acute exacerbation due to dietary noncompliance (has eaten at [facility] every morning for 1 week) and substance use (heavy alcohol use for past week). RHC with elevated left sided pressures with LVEDP . LHC with clean coronaries. Will continue IV diuresis today.  -LHC with clean coronaries -RHC with elevated left sided pressures with LVEDP -Change to lasix 40mg  daily -Continue entresto 24-26mg  BID -Continue spironolactone 12.5mg  daily -Continue low dose metop 25mg  XL daily -Add farxiga tomorrow as able -Monitor I/Os and daily weights -Discussed low Na diet at length; will need dietary counseling prior to discharge -Will continue surgical work-up for AVR as below  #Bicuspid AoV: #Severe AI: Patient with bicuspid aortic valve with severe AI. LV severely dilated with depressed EF at 31%. LHC with clean coronaries. Plan to see CT surgery as outpatient for AVR.  -Manage HF as above -Plan for CT surgery evaluation as outpatient  #Polysubstance Abuse: Admits to drinking, smoking marijuana and doing cocaine. States he is very motivated to quit and wants to ensure he is able to undergo surgery for his valve.      For questions or updates, please contact [facility] Please consult [email or web address] for contact info under        Signed, , MD  12/04/2021, 8:57 PM

## 2021-12-04 NOTE — TOC Progression Note (Addendum)
Transition of Care [facility]) - Progression Note    Patient Details  Name: Darius [name] MRN: [id number] Date of Birth: 1970-11-20  [facility] CM/SW Contact  Leone Haven, RN Phone Number: 12/04/2021, 10:41 AM  Clinical Narrative:    Patient has no insurance ,will  be on [place].  [facility]. They will help him with patient ast with medications, will need to have doctor to send scripts there to be filled. Patient states he goes to [facility], NCM scheduled a follow up apt on AVS.  NCM spoke with Jacki Cones [facility], she states they were providing transportation for him but he was not showing up for apts, so they had to stop paying for transport for him.  Patient will have to find his transport for his follow up apt. TOC following.  Update for where scripts to be sent, - Scripts will be sent to [facility] pharmacy, Tonna Corner will work on patient ast for Rande Brunt for patient.  Wife is at the bedside and she states she will make sure he has transportation to [facility] for his follow up apt on 8/16.  Patient was stating he is on oxygen here but not at home, he states he was told he has sleep apnea. NCM will inform MD of this information.  MD states to have [employer] to look into for either NIV/Bipap. NCM made rerferral to [facility] with [facility].        Expected Discharge Plan and Services                                                 Social Determinants of Health (SDOH) Interventions    Readmission Risk Interventions     No data to display

## 2021-12-04 NOTE — Progress Notes (Signed)
Mobility Specialist: Progress Note   12/04/21 1722  Mobility  Activity Ambulated with assistance in hallway  Level of Assistance Standby assist, set-up cues, supervision of patient - no hands on  Assistive Device Four wheel walker  Distance Ambulated (ft) 1000 ft  Activity Response Tolerated well  $Mobility charge 1 Mobility   Pre-Mobility: 73 HR, 98% SpO2 Post-Mobility: 81 HR, 94% SpO2  Pt received in the hallway and agreeable to ambulation. Stopped x1 for standing break with c/o feeling anxious and mild SOB during ambulation. Pt to the chair after session and set up with his dinner. Pt has call bell and phone in reach. Pt placed on 2 L/min [name] for comfort after returning to the room.   [facility] Darius [name] Mobility Specialist Mobility Specialist [address]: [phone]

## 2021-12-04 NOTE — Progress Notes (Signed)
Heart Failure Nurse Navigator Progress Note  PCP: Pcp, No PCP-Cardiologist: Ellyn Hack Admission Diagnosis: Acute on chronic congestive heart failure.  Admitted from: Home  Presentation:   Darius [name] presented with shortness of breath, swelling to legs, tried extra lasix, but it didn't help, couldn't lay flat. Notable JVD, BP 159/84, HR 85, BNP 3,097, Troponin 54, IV lasix 20 mg given and breathing treatment. Trailed on BiPAP. He recently saw Dr. Ellyn Hack in a virtual visit.  This was on 8/4, and at that time  noted to have significant orthopnea with 3 pillows and occasional PND.  Notably, he was intermittently noncompliant with his medications but recently started them back including his Lasix.  He was scheduled to have a right and left heart catheterization on 12/20/2021 and then subsequently get cardiothoracic surgery consultation for consideration of aortic valve replacement.  Unfortunately he worsened and presented to the emergency department. A RHC/LHC on 12/03/2021 .   Patient and wife "Ivin Booty" were educated on the sign and symptoms of heart failure, daily weights, patient stated he lives in a boarding house and has access to a scale , his wife currently lives with her mother. Diet/ fluid restrictions, ( patient stated during this last week he ate at [employer] everyday for 6 days, States he now takes his medications as prescribed and wants to quit his substance history, "however marijuana is the only thing that helps his acid reflux. " He lives in [place] in a boarding house and usually uses a service thru [place] for transports , for his upcoming HF [facility] appointment on 12/13/2021 @ 11 am his wife states that she will bring him for this appointment.   ECHO/ LVEF: 40-45% G3DD HFrEF  Clinical Course:  Past Medical History:  Diagnosis Date   Aortic insufficiency due to bicuspid aortic valve 10/10/2021   Cardiac MRI 05/11/2021: Bicuspid aortic valve with fusion of the R & [place] cusps with  severe AI (regurgitant fraction of 59%.   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  Severe AI 2/2 Bicusid AoV ([place] cusp fusion). Ascending Ao 42 mm   Valvular heart disease    a.) TTE on 12/19/2017 --> mild MV regurgitation; moderate AV regurgitation     Social History   Socioeconomic History   Marital status: Legally Separated    Spouse name: Ivin Booty   Number of children: 2   Years of education: Not on file   Highest education level: GED or equivalent  Occupational History   Occupation: disability  Tobacco Use   Smoking status: Some Days    Packs/day: 0.25    Years: 35.00    Total pack years: 8.75    Types: Cigarettes    Last attempt to quit: 08/2020    Years since quitting: 1.2   Smokeless tobacco: Never   Tobacco comments:    Smokes 4-5 cigarettes per week.   Vaping Use   Vaping Use:  Former   Quit date: 05/12/2021   Substances: Nicotine, Flavoring  Substance and Sexual Activity   Alcohol use: Yes    Alcohol/week: 2.0 standard drinks of alcohol    Types: 2 Cans of beer per week    Comment: socially   Drug use: Yes    Types: Marijuana, Other-see comments, "Crack" cocaine    Comment: last use of crack cocaine 01/2021 current use of marijuana   Sexual activity: Yes  Other Topics Concern   Not on file  Social History Narrative      > From cardiology clinic note on October 10, 2021:      He is "doing the best he can "to cut out  cigarettes and drugs.  He is down to about 5 cigarettes a week-not buying any, and and only smoking but he can borrow.  However he was with some friends a couple days ago and was smoking some marijuana that "unbeknownst to him "was laced with "powder ".  He had a little bit of heart fast heart rates and some discomfort in his chest after smoking "blunt".   Social Determinants of Health   Financial Resource Strain: High Risk (10/31/2021)   Overall Financial Resource Strain (CARDIA)    Difficulty of Paying Living Expenses: Very hard  Food Insecurity: No Food Insecurity (10/31/2021)   Hunger Vital Sign    Worried About Running Out of Food in the Last Year: Never true    Ran Out of Food in the Last Year: Never true  Transportation Needs: Unmet Transportation Needs (12/04/2021)   PRAPARE - Administrator, Civil Service (Medical): Yes    Lack of Transportation (Non-Medical): Yes  Physical Activity: Unknown (03/01/2018)   Exercise Vital Sign    Days of Exercise per Week: 7 days    Minutes of Exercise per Session: Patient refused  Stress: Stress Concern Present (03/01/2018)   [employer] of Occupational Health - Occupational Stress Questionnaire    Feeling of Stress : Very much  Social Connections: Moderately Isolated (03/01/2018)   Social Connection and Isolation Panel [NHANES]    Frequency of Communication with Friends and Family: Never    Frequency of Social Gatherings with Friends and Family: Never    Attends Religious Services: Never    Diplomatic Services operational officer: No    Attends Engineer, structural: Never    Marital Status: Married   Water engineer and Provision:  Detailed education and instructions provided on heart failure disease management including the following:  Signs and symptoms of Heart Failure When to call the physician Importance of daily weights Low sodium diet Fluid restriction Medication management Anticipated future follow-up  appointments  Patient education given on each of the above topics.  Patient acknowledges understanding via teach back method and acceptance of all instructions.  Education Materials:  "Living Better With Heart Failure" Booklet, HF zone tool, & Daily Weight Tracker Tool.  Patient has scale at home: yes Patient has pill box at home: NA    High Risk Criteria for Readmission and/or Poor Patient Outcomes: Heart failure hospital admissions (last 6 months): 2  No Show rate: 24 % Difficult social situation: Yes, lives in [place] house Demonstrates medication adherence: no Primary Language: English Literacy level: Reading, writing, and comprehension.   Barriers of Care:   Diet/ fluid restrictions  Daily weights Substance cessation ? transportation  Considerations/Referrals:   Referral made to Heart Failure Pharmacist Stewardship: Yes Referral made to Heart Failure CSW/NCM TOC: Yes  Referral made to Heart & Vascular TOC clinic: Yes, 12/13/2021 @ 11 am  Items for Follow-up on DC/TOC: Diet/ fluid restrictions ( [employer])  Daily weights Pharm: med costs (applied for Medicaid) CSM: Arboriculturist, lives in a Boarding house   [employer], Scientist, research (physical sciences), Energy manager Chat Only

## 2021-12-04 NOTE — Plan of Care (Signed)
  Problem: Education: Goal: Understanding of CV disease, CV risk reduction, and recovery process will improve Outcome: Progressing   Problem: Activity: Goal: Ability to return to baseline activity level will improve Outcome: Progressing  Walked hall twice with walker. Patient tolerated well.

## 2021-12-04 NOTE — Significant Event (Signed)
Pulled ativan for patient then he changed his mind that he did not want it. When writer tried to return the drawer failed to open without a failed cubie message,  called pharmacy for direction on how to return safely. Writer used a previous removal to to return to pxyis successfully.

## 2021-12-04 NOTE — Progress Notes (Signed)
Progress Note  Patient Name: Darius [name] Date of Encounter: 12/04/2021  [facility] Cardiologist: Bryan Lemma, MD   Subjective   Feels better today. Breathing improved. No chest pain. Right radial access site c/d/i  RHC/LHC yesterday showed: -Normal coronaries -RAP , PA mean 33, PCWP , LVEDP , CO 4.03, CI 2.08.   Wt 147>144.  Net negative 2.24L Cr 1.34  Inpatient Medications    Scheduled Meds:  aspirin EC  81 mg Oral Daily   furosemide  40 mg Intravenous BID   heparin  5,000 Units Subcutaneous Q8H   metoprolol succinate  25 mg Oral Daily   sodium chloride flush  3 mL Intravenous Q12H   sodium chloride flush  3 mL Intravenous Q12H   spironolactone  12.5 mg Oral Daily   Continuous Infusions:  sodium chloride     sodium chloride     PRN Meds: sodium chloride, sodium chloride, acetaminophen, albuterol, LORazepam, ondansetron (ZOFRAN) IV, oxyCODONE, sodium chloride flush, sodium chloride flush   Vital Signs    Vitals:   12/03/21 1700 12/03/21 1800 12/03/21 1900 12/04/21 0410  BP: 116/70 101/61 111/66 115/72  Pulse: 75 79 76 82  Resp:   20 20  Temp:   (!) 97.5 F (36.4 C) 97.7 F (36.5 C)  TempSrc:   Oral Oral  SpO2: 100% 100% 100% 100%  Weight:    65.7 kg  Height:        Intake/Output Summary (Last 24 hours) at 12/04/2021 0902 Last data filed at 12/04/2021 0413 Gross per 24 hour  Intake 360 ml  Output 2600 ml  Net -2240 ml       12/04/2021    4:10 AM 12/03/2021    4:32 AM 12/02/2021    4:22 AM  Last 3 Weights  Weight (lbs) 144 lb 12.8 oz 147 lb 12.8 oz 152 lb  Weight (kg) 65.681 kg 67.042 kg 68.947 kg      Telemetry    NSR - Personally Reviewed  ECG    No new tracing- Personally Reviewed  Physical Exam   GEN: Laying in bed, comfortable Neck: No significant JVD Cardiac: RRR, 2/6 holodiastolic murmur Respiratory: CTAB GI: Soft, nontender, non-distended  MS: Thin, warm, no edema Neuro:  Nonfocal  Psych: Normal affect    Labs    High Sensitivity Troponin:   Recent Labs  Lab 12/02/21 0001 12/02/21 0248  TROPONINIHS 54* 69*      Chemistry Recent Labs  Lab 12/02/21 1510 12/03/21 0703 12/03/21 1458 12/03/21 1459 12/03/21 1509 12/04/21 0406  NA 141 141   < > 141 139 138  K 4.2 4.0   < > 4.4 4.4 3.8  CL 109 107  --   --   --  104  CO2 22 27  --   --   --  24  GLUCOSE 97 92  --   --   --  97  BUN 23* 21*  --   --   --  26*  CREATININE 1.52* 1.34*  --   --   --  1.34*  CALCIUM 9.5 9.3  --   --   --  9.2  GFRNONAA 54* >60  --   --   --  >60  ANIONGAP 10 7  --   --   --  10   < > = values in this interval not displayed.     Lipids No results for input(s): "CHOL", "TRIG", "HDL", "LABVLDL", "LDLCALC", "CHOLHDL" in the last 168 hours.  Hematology Recent Labs  Lab 12/02/21 0001 12/03/21 0703 12/03/21 1458 12/03/21 1459 12/03/21 1509  WBC 5.6 4.9  --   --   --   RBC 4.11* 4.26  --   --   --   HGB 13.0 13.4 13.6 13.6 13.6  HCT 39.0 39.5 40.0 40.0 40.0  MCV 94.9 92.7  --   --   --   MCH 31.6 31.5  --   --   --   MCHC 33.3 33.9  --   --   --   RDW 12.6 12.4  --   --   --   PLT 203 220  --   --   --     Thyroid No results for input(s): "TSH", "FREET4" in the last 168 hours.  BNP Recent Labs  Lab 12/02/21 0001  BNP 3,097.3*     DDimer No results for input(s): "DDIMER" in the last 168 hours.   Radiology    CARDIAC CATHETERIZATION  Result Date: 12/03/2021 CONCLUSIONS: Left dominant normal coronary arteries. LVEDP 41 mmHg.  Aortic pressure 114/62 mmHg. Mild pulmonary hypertension, mean 33 mmHg.  Mean capillary wedge pressure 26 mmHg.  WHO group 2 etiology. Cardiac output 4.03 L/min with index 2.08. Normal right heart pressures. RECOMMENDATIONS: Still volume overloaded Afterload reduction may be helpful Heart failure, decline in EF, make aortic valve replacement necessary.    Cardiac Studies   RHC/LHC 12/03/21: CONCLUSIONS: Left dominant normal coronary arteries. LVEDP 41 mmHg.   Aortic pressure 114/62 mmHg. Mild pulmonary hypertension, mean 33 mmHg.  Mean capillary wedge pressure 26 mmHg.  WHO group 2 etiology. Cardiac output 4.03 L/min with index 2.08. Normal right heart pressures.   RECOMMENDATIONS: Still volume overloaded Afterload reduction may be helpful Heart failure, decline in EF, make aortic valve replacement necessary.    CMR 11/08/21:   FINDINGS: Left ventricle:   -Severe dilatation   -Moderate systolic dysfunction   -Elevated ECV (34%)   -Normal T2 values   -RV insertion site LGE   LV EF: 31% (Normal 56-78%)   Absolute volumes:   LV EDV: (Normal 77-195 mL)   LV ESV: (Normal 19-72 mL)   LV SV: (Normal 51-133 mL)   CO: 10.4L/min (Normal 2.8-8.8 L/min)   Indexed volumes:   LV EDV: 254mL/sq-m (Normal 47-92 mL/sq-m)   LV ESV: 191mL/sq-m (Normal 13-30 mL/sq-m)   LV SV: 3mL/sq-m (Normal 32-62 mL/sq-m)   CI: 5.5L/min/sq-m (Normal 1.7-4.2 L/min/sq-m)   Right ventricle: Severe dilatation with moderate systolic dysfunction   RV EF:  30% (Normal 47-74%)   Absolute volumes:   RV EDV: (Normal 88-227 mL)   RV ESV: (Normal 23-103 mL)   RV SV: 62mL (Normal 52-138 mL)   CO: 7.6L/min (Normal 2.8-8.8 L/min)   Indexed volumes:   RV EDV: 184mL/sq-m (Normal 55-105 mL/sq-m)   RV ESV: 135mL/sq-m (Normal 15-43 mL/sq-m)   RV SV: 34mL/sq-m (Normal 32-64 mL/sq-m)   CI: 4.0L/min/sq-m (Normal 1.7-4.2 L/min/sq-m)   Left atrium: Moderate enlargement   Right atrium: Mild enlargement   Mitral valve: Mild regurgitation   Aortic valve: Bicuspid aortic valve with fusion of right and noncoronary cusps. Severe regurgitation (regurgitant fraction 59%)   Tricuspid valve: Mild regurgitation   Pulmonic valve: No regurgitation   Aorta: Dilated ascending aorta measuring 80mm   Pericardium: Small effusion   IMPRESSION: 1. Bicuspid aortic valve with fusion of right and noncoronary cusps. Severe aortic  regurgitation (regurgitant fraction 59%)   2.  Severe LV dilatation with  moderate systolic dysfunction (EF [id number])   3.  Severe RV dilatation with moderate systolic dysfunction (EF [id number])   4. RV insertion site late gadolinium enhancement, which is a nonspecific finding often seen in setting of elevated pulmonary pressures   5. Dilated ascending aorta measuring 55mm  TTE 09/15/21: IMPRESSIONS     1. Bicuspid Aortic valve, No stenosis , Mild-Mod AI.   2. Bright peckled myocardium ,consider infiltrative process. Consider  Amyloid/Sarcoidosis.   3. Left ventricular ejection fraction, by estimation, is 40 to 45%. The  left ventricle has mildly decreased function. The left ventricle  demonstrates global hypokinesis. The left ventricular internal cavity size  was moderately to severely dilated. There   is mild concentric left ventricular hypertrophy. Left ventricular  diastolic parameters are consistent with Grade III diastolic dysfunction  (restrictive).   4. Right ventricular systolic function is mildly reduced. The right  ventricular size is mildly enlarged. Mildly increased right ventricular  wall thickness.   5. Left atrial size was mildly dilated.   6. Right atrial size was mildly dilated.   7. The mitral valve is normal in structure. Trivial mitral valve  regurgitation.   8. The aortic valve is bicuspid. Aortic valve regurgitation is mild to  moderate. Aortic valve sclerosis is present, with no evidence of aortic  valve stenosis.   Patient Profile     51 y.o. male with history of bicuspid aortic valve with valvular cardiomyopathy, severe aortic insufficiency with biventricular failure, chronic systolic heart failure with EF 35%, chronic polysubstance abuse (tobacco, alcohol, marijuana, and occasional cocaine) who presents with worsening SOB, orthopnea and PND found to have acute on chronic systolic heart failure exacerbation.  Assessment & Plan    #Acute on Chronic Systolic HF  Exacerbation: #NICM: CMR with severe LV dilation, EF 31%, bicuspid AoV with severe AR, mild TR, mild MR, moderate RV systolic dysfunction. Thought to be due to valvular cardiomyopathy in the setting of severe AI. Was planned for RHC/LHC as outpatient as part of surgical work-up for AVR but presented with worsening volume overload with BNP 3000. Likely acute exacerbation due to dietary noncompliance (has eaten at [facility] every morning for 1 week) and substance use (heavy alcohol use for past week). RHC with elevated left sided pressures with LVEDP 30mmHg. LHC with clean coronaries. Will continue IV diuresis today.  -LHC with clean coronaries -RHC with elevated left sided pressures with LVEDP 14mmHg -Give one more dose lasix 40mg  IV today; likely transition to PO tomorrow -Start entresto 24-26mg  BID -Continue spironolactone 12.5mg  daily -Continue low dose metop 25mg  XL daily -Add farxiga tomorrow as able -Monitor I/Os and daily weights -Discussed low Na diet at length; will need dietary counseling prior to discharge -Will continue surgical work-up for AVR as below  #Bicuspid AoV: #Severe AI: Patient with bicuspid aortic valve with severe AI. LV severely dilated with depressed EF at 31%. LHC with clean coronaries. Plan to see CT surgery as outpatient for AVR.  -Manage HF as above -Plan for CT surgery evaluation as outpatient  #Polysubstance Abuse: Admits to drinking, smoking marijuana and doing cocaine. States he is very motivated to quit and wants to ensure he is able to undergo surgery for his valve.      For questions or updates, please contact [facility] Please consult [email or web address] for contact info under        Signed, Freada Bergeron, MD  12/04/2021, 9:02 AM

## 2021-12-05 ENCOUNTER — Other Ambulatory Visit (HOSPITAL_COMMUNITY): Payer: Self-pay

## 2021-12-05 ENCOUNTER — Inpatient Hospital Stay (HOSPITAL_COMMUNITY): Payer: Medicaid Other

## 2021-12-05 ENCOUNTER — Telehealth (HOSPITAL_COMMUNITY): Payer: Self-pay

## 2021-12-05 DIAGNOSIS — Z01810 Encounter for preprocedural cardiovascular examination: Secondary | ICD-10-CM

## 2021-12-05 LAB — BASIC METABOLIC PANEL
Anion gap: 8 (ref 5–15)
BUN: 28 mg/dL — ABNORMAL HIGH (ref 6–20)
CO2: 27 mmol/L (ref 22–32)
Calcium: 9.1 mg/dL (ref 8.9–10.3)
Chloride: 104 mmol/L (ref 98–111)
Creatinine, Ser: 1.36 mg/dL — ABNORMAL HIGH (ref 0.61–1.24)
GFR, Estimated: >60 mL/min (ref >60)
Glucose, Bld: 102 mg/dL — ABNORMAL HIGH (ref 70–99)
Potassium: 3.8 mmol/L (ref 3.5–5.1)
Sodium: 139 mmol/L (ref 135–145)

## 2021-12-05 LAB — LIPOPROTEIN A (LPA): Lipoprotein (a): 281.7 nmol/L — ABNORMAL HIGH (ref <75.0)

## 2021-12-05 MED ORDER — DAPAGLIFLOZIN PROPANEDIOL 10 MG PO TABS
10.0000 mg | ORAL_TABLET | Freq: Every day | ORAL | 11 refills | Status: DC
  Filled 2021-12-05: qty 30, 30d supply, fill #0
  Filled 2021-12-31: qty 30, 30d supply, fill #1

## 2021-12-05 MED ORDER — SPIRONOLACTONE 25 MG PO TABS
12.5000 mg | ORAL_TABLET | Freq: Every day | ORAL | 3 refills | Status: DC
  Filled 2021-12-05: qty 15, 30d supply, fill #0

## 2021-12-05 MED ORDER — SACUBITRIL-VALSARTAN 24-26 MG PO TABS
1.0000 | ORAL_TABLET | Freq: Two times a day (BID) | ORAL | 11 refills | Status: DC
  Filled 2021-12-05: qty 60, 30d supply, fill #0

## 2021-12-05 MED ORDER — METOPROLOL SUCCINATE ER 25 MG PO TB24
25.0000 mg | ORAL_TABLET | Freq: Every day | ORAL | 3 refills | Status: DC
Start: 2021-12-06 — End: 2021-12-31
  Filled 2021-12-05: qty 30, 30d supply, fill #0
  Filled 2021-12-31 (×2): qty 30, 30d supply, fill #1

## 2021-12-05 MED ORDER — FUROSEMIDE 20 MG PO TABS
20.0000 mg | ORAL_TABLET | Freq: Every day | ORAL | 3 refills | Status: DC
Start: 2021-12-06 — End: 2021-12-31
  Filled 2021-12-05: qty 30, 30d supply, fill #0

## 2021-12-05 MED ORDER — ALBUTEROL SULFATE HFA 108 (90 BASE) MCG/ACT IN AERS
2.0000 | INHALATION_SPRAY | Freq: Four times a day (QID) | RESPIRATORY_TRACT | 3 refills | Status: DC | PRN
  Filled 2021-12-05: qty 18, 30d supply, fill #0

## 2021-12-05 MED ORDER — FUROSEMIDE 20 MG PO TABS
20.0000 mg | ORAL_TABLET | Freq: Every day | ORAL | Status: DC
  Administered 2021-12-05: 20 mg via ORAL
  Filled 2021-12-05: qty 1

## 2021-12-05 MED ORDER — DAPAGLIFLOZIN PROPANEDIOL 10 MG PO TABS
10.0000 mg | ORAL_TABLET | Freq: Every day | ORAL | Status: DC
  Administered 2021-12-05: 10 mg via ORAL
  Filled 2021-12-05: qty 1

## 2021-12-05 NOTE — Progress Notes (Signed)
CARDIAC REHAB PHASE I   Phase one order received. Pt to be outpt surgical consult. Pt looking to be d/c'd today. Will f/u if pt remains in-house tomorrow, if pt d/c's will f/u as appropriate when pt returns for surgery.   Reynold Bowen, RN BSN 12/05/2021 3:27 PM

## 2021-12-05 NOTE — Discharge Summary (Addendum)
Discharge Summary    Patient ID: Darius [name] MRN: [id number]; DOB: 08-05-1970  Admit date: 12/01/2021 Discharge date: 12/05/2021  PCP:  Oneita Hurt No   [facility] Providers Cardiologist:  Bryan Lemma, MD     Discharge Diagnoses    Principal Problem:   Aortic regurgitation due to bicuspid aortic valve Active Problems:   (HFpEF) heart failure with preserved ejection fraction (HCC)   Chronic combined systolic and diastolic heart failure (HCC)   COPD (chronic obstructive pulmonary disease) (HCC)   Acute on chronic congestive heart failure (HCC)    Diagnostic Studies/Procedures    Right/Left Heart Catheterization 12/03/2021 CONCLUSIONS: Left dominant normal coronary arteries. LVEDP 41 mmHg.  Aortic pressure 114/62 mmHg. Mild pulmonary hypertension, mean 33 mmHg.  Mean capillary wedge pressure 26 mmHg.  WHO group 2 etiology. Cardiac output 4.03 L/min with index 2.08. Normal right heart pressures.   RECOMMENDATIONS: Still volume overloaded Afterload reduction may be helpful Heart failure, decline in EF, make aortic valve replacement necessary. _____________   History of Present Illness     Darius [name] is a 51 y.o. male with a past medical history of bicuspid aortic valve, severe AI, valvular cardiomyopathy, chronic systolic heart failure, chronic polysubstance abuse (tobacco, alcohol, marijuana, cocaine) who was seen on 12/02/2021 for the evaluation of shortness of breath. Of note, most recent echocardiogram from 09/15/2021 showed a bicuspid aortic valve with mild-moderate AI, EF 40-45%, grade 3 diastolic dysfunction, mildly reduced RV systolic function.  Cardiac MRI from 11/08/2021 showed a bicuspid aortic valve with severe aortic regurgitation, severe LV dilation, EF 31%, severe RV dilation with moderate RV systolic dysfunction, dilated a ascending aorta measuring 42 mm.  Patient is a 51 year old male with above medical history who is followed by Dr. Herbie Baltimore.  Per chart review,  patient recently saw Dr. Herbie Baltimore in virtual visit on 8/4.  At that time, patient was noted to have significant orthopnea with 3 pillows, occasional PND.  Patient reported that he was intermittently noncompliant with his medications but recently had started them all back.  At that appointment, it was decided that patient would have a right and left heart catheterization on 12/20/2021 and then would be referred to cardiothoracic surgery for consider of ration of aortic valve replacement.  However, before this evaluation was complete patient presented to the ER with SOB.    Patient presented to the ED on 8/6 complaining of shortness of breath that have been going on for the last several days.  Breathing had gotten much worse the evening he came to the ER.  Complained of orthopnea, lower extremity edema.  He was not having any chest pain.  He tried taking an extra dose of Lasix before coming to the ER, but it did not help relieve his symptoms.  Hospital Course     Consultants: None  Acute on Chronic Systolic HF Exacerbation: NICM: - Recent CMR from 10/2021 with severe LV dilation, EF 31%, bicuspid AoV with severe AR, mild TR, mild MR, moderate RV systolic dysfunction.  - Thought that his reduced EF was due to valvular cardiomyopathy in the setting of severe AI.  - Originally, patient was set to undergo RHC/LHC as outpatient as part of surgical work-up for AVR. However, patient presented with worsening volume overload with BNP 3000. Believe that this acute exacerbation was due to dietary noncompliance (has eaten at [facility] every morning for 1 week) and substance use (heavy alcohol use for past week).  - Patient underwent L/R heart catheterization this admission with elevated  left sided pressures with LVEDP . LHC with clean coronaries.  - Patient was diuresed with IV lasix-- net -8 L this admission  - Transitioned to lasix  daily at discharge  - Started farxiga  daily this admission  -  Continue entresto 24-26mg  BID - Continue spironolactone 12.5mg  daily - Continue low dose metop  XL daily - Discussed low Na diet at length; will need dietary counseling prior to discharge -Will continue surgical work-up for AVR as below   Bicuspid AoV: Severe AI: - Patient with bicuspid aortic valve with severe AI. LV severely dilated with depressed EF at 31%. LHC with clean coronaries. Plan to follow up with CT surgery as outpatient for further consideration of AVR.  - Manage HF as above - Discussed with CT surgery this admission- Patient had DG orthopantogram, pre-CABG dopplers, and cardiac rehab evaluation this admission prior to discharge.  - Patient has a follow up appointment with Dr. Cliffton Asters on 8/18    Polysubstance Abuse: - Admits to drinking, smoking marijuana and doing cocaine. States he is very motivated to quit and wants to ensure he is able to undergo surgery for his valve.   Did the patient have an acute coronary syndrome (MI, NSTEMI, STEMI, etc) this admission?:  No                               Did the patient have a percutaneous coronary intervention (stent / angioplasty)?:  No.    Patient has an appointment with Dr. Herbie Baltimore on 9/7. He also has an appointments with Dr. Cliffton Asters on 8/18 and heart/vascular TOC on 8/18.   Patient was seen and examined by Dr. Shari Prows and deemed stable for discharge.     _____________  Discharge Vitals Blood pressure 105/67, pulse 74, temperature 98.5 F (36.9 C), temperature source Oral, resp. rate 20, height  (1.88 m), weight 66.1 kg, SpO2 97 %.  Filed Weights   12/03/21 0432 12/04/21 0410 12/05/21 0645  Weight: 67 kg 65.7 kg 66.1 kg    Labs & Radiologic Studies    CBC Recent Labs    12/03/21 0703 12/03/21 1458 12/03/21 1459 12/03/21 1509  WBC 4.9  --   --   --   HGB 13.4   < > 13.6 13.6  HCT 39.5   < > 40.0 40.0  MCV 92.7  --   --   --   PLT 220  --   --   --    < > = values in this interval not displayed.    Basic Metabolic Panel Recent Labs    16/10/96 0406 12/05/21 0410  NA 138 139  K 3.8 3.8  CL 104 104  CO2 24 27  GLUCOSE 97 102*  BUN 26* 28*  CREATININE 1.34* 1.36*  CALCIUM 9.2 9.1   Liver Function Tests No results for input(s): "AST", "ALT", "ALKPHOS", "BILITOT", "PROT", "ALBUMIN" in the last 72 hours. No results for input(s): "LIPASE", "AMYLASE" in the last 72 hours. High Sensitivity Troponin:   Recent Labs  Lab 12/02/21 0001 12/02/21 0248  TROPONINIHS 54* 69*    BNP Invalid input(s): "POCBNP" D-Dimer No results for input(s): "DDIMER" in the last 72 hours. Hemoglobin A1C No results for input(s): "HGBA1C" in the last 72 hours. Fasting Lipid Panel No results for input(s): "CHOL", "HDL", "LDLCALC", "TRIG", "CHOLHDL", "LDLDIRECT" in the last 72 hours. Thyroid Function Tests No results for input(s): "TSH", "T4TOTAL", "T3FREE", "THYROIDAB" in the  last 72 hours.  Invalid input(s): "FREET3" _____________  VAS US DOPPLER PRE CABG  Result Date: 12/05/2021 PREOPERATIVE VASCULAR EVALUATION Patient Name:  Darius [name]  Date of Exam:   12/05/2021 Medical Rec #: [id number]     Accession #:    [id number] Date of Birth: [birth year]     Patient Gender: M Patient Age:   89 years Exam Location:  [facility] Procedure:      VAS US DOPPLER PRE CABG Referring Phys: HARRELL LIGHTFOOT --------------------------------------------------------------------------------  Indications:   Pre-op AVR. Risk Factors:  Hypertension, current smoker. Other Factors: CHF, COPD, aortic insufficiency due to bicuspid aortic valve. Performing Technologist: Jean Rosenthal RDMS, RVT  Examination Guidelines: A complete evaluation includes B-mode imaging, spectral Doppler, color Doppler, and power Doppler as needed of all accessible portions of each vessel. Bilateral testing is considered an integral part of a complete examination. Limited examinations for reoccurring indications may be performed as noted.  Right  Carotid Findings: +----------+--------+--------+--------+--------+--------+           PSV cm/sEDV cm/sStenosisDescribeComments +----------+--------+--------+--------+--------+--------+ CCA Prox  103     7                                +----------+--------+--------+--------+--------+--------+ CCA Distal72      12                               +----------+--------+--------+--------+--------+--------+ ICA Prox  28      8                                +----------+--------+--------+--------+--------+--------+ ICA Mid   68      18                               +----------+--------+--------+--------+--------+--------+ ICA Distal66      22                               +----------+--------+--------+--------+--------+--------+ ECA       87      7                                +----------+--------+--------+--------+--------+--------+ +----------+--------+-------+----------------+------------+           PSV cm/sEDV cmsDescribe        Arm Pressure +----------+--------+-------+----------------+------------+ Subclavian98             Multiphasic, WNL             +----------+--------+-------+----------------+------------+ +---------+--------+--+--------+--+---------+ VertebralPSV cm/s63EDV cm/s10Antegrade +---------+--------+--+--------+--+---------+ Left Carotid Findings: +----------+--------+--------+--------+--------+------------------+           PSV cm/sEDV cm/sStenosisDescribeComments           +----------+--------+--------+--------+--------+------------------+ CCA Prox  91      9                                          +----------+--------+--------+--------+--------+------------------+ CCA Distal101     13                                         +----------+--------+--------+--------+--------+------------------+  ICA Prox  41      7                       intimal thickening  +----------+--------+--------+--------+--------+------------------+ ICA Mid   57      22                                         +----------+--------+--------+--------+--------+------------------+ ICA Distal52      11                                         +----------+--------+--------+--------+--------+------------------+ ECA       87                                                 +----------+--------+--------+--------+--------+------------------+ +----------+--------+--------+----------------+------------+ SubclavianPSV cm/sEDV cm/sDescribe        Arm Pressure +----------+--------+--------+----------------+------------+           87              Multiphasic, WNL             +----------+--------+--------+----------------+------------+ +---------+--------+--+--------+-+---------+ VertebralPSV cm/s68EDV cm/s8Antegrade +---------+--------+--+--------+-+---------+  Summary: Right Carotid: There was no evidence of thrombus, dissection, atherosclerotic                plaque or stenosis in the cervical carotid system. Left Carotid: There was no evidence of thrombus, dissection, atherosclerotic               plaque or stenosis in the cervical carotid system. Vertebrals:  Bilateral vertebral arteries demonstrate antegrade flow. Subclavians: Normal flow hemodynamics were seen in bilateral subclavian              arteries. Right Upper Extremity: Doppler waveforms decrease >50% with right radial compression. Doppler waveforms remain within normal limits with right ulnar compression. Left Upper Extremity: Doppler waveforms remain within normal limits with left radial compression. Doppler waveforms remain within normal limits with left ulnar compression.     Preliminary    CARDIAC CATHETERIZATION  Result Date: 12/03/2021 CONCLUSIONS: Left dominant normal coronary arteries. LVEDP 41 mmHg.  Aortic pressure 114/62 mmHg. Mild pulmonary hypertension, mean 33 mmHg.  Mean capillary wedge  pressure 26 mmHg.  WHO group 2 etiology. Cardiac output 4.03 L/min with index 2.08. Normal right heart pressures. RECOMMENDATIONS: Still volume overloaded Afterload reduction may be helpful Heart failure, decline in EF, make aortic valve replacement necessary.   DG Chest 2 View  Result Date: 12/02/2021 CLINICAL DATA:  Dyspnea EXAM: CHEST - 2 VIEW COMPARISON:  09/15/2021 FINDINGS: The lungs are symmetrically well expanded. No pneumothorax or pleural effusion. Moderate cardiomegaly appears slightly progressive in the interval since prior examination. There is increasing perihilar interstitial pulmonary infiltrate in keeping with progressive perihilar pulmonary edema as well as progressive central pulmonary vascular engorgement. No acute bone abnormality. IMPRESSION: Progressive cardiomegaly with development of mild cardiogenic failure. Electronically Signed   By: Helyn Numbers M.D.   On: 12/02/2021 00:34   MR CARDIAC MORPHOLOGY W WO CONTRAST  Result Date: 11/14/2021 CLINICAL DATA:  Evaluate cardiomyopathy, AI EXAM: CARDIAC MRI TECHNIQUE: The patient was scanned on a 1.5 Tesla Siemens magnet. A dedicated cardiac  coil was used. Functional imaging was done using Fiesta sequences. 2,3, and 4 chamber views were done to assess for RWMA's. Modified Simpson's rule using a short axis stack was used to calculate an ejection fraction on a dedicated work [employer]. The patient received 10 cc of Gadavist. After 10 minutes inversion recovery sequences were used to assess for infiltration and scar tissue. CONTRAST:  10 cc  of Gadavist FINDINGS: Left ventricle: -Severe dilatation -Moderate systolic dysfunction -Elevated ECV (34%) -Normal T2 values -RV insertion site LGE LV EF: 31% (Normal 56-78%) Absolute volumes: LV EDV: (Normal 77-195 mL) LV ESV: (Normal 19-72 mL) LV SV: (Normal 51-133 mL) CO: 10.4L/min (Normal 2.8-8.8 L/min) Indexed volumes: LV EDV: 295mL/sq-m (Normal 47-92 mL/sq-m) LV  ESV: 191mL/sq-m (Normal 13-30 mL/sq-m) LV SV: 70mL/sq-m (Normal 32-62 mL/sq-m) CI: 5.5L/min/sq-m (Normal 1.7-4.2 L/min/sq-m) Right ventricle: Severe dilatation with moderate systolic dysfunction RV EF:  30% (Normal 47-74%) Absolute volumes: RV EDV: (Normal 88-227 mL) RV ESV: (Normal 23-103 mL) RV SV: 72mL (Normal 52-138 mL) CO: 7.6L/min (Normal 2.8-8.8 L/min) Indexed volumes: RV EDV: 154mL/sq-m (Normal 55-105 mL/sq-m) RV ESV: 156mL/sq-m (Normal 15-43 mL/sq-m) RV SV: 43mL/sq-m (Normal 32-64 mL/sq-m) CI: 4.0L/min/sq-m (Normal 1.7-4.2 L/min/sq-m) Left atrium: Moderate enlargement Right atrium: Mild enlargement Mitral valve: Mild regurgitation Aortic valve: Bicuspid aortic valve with fusion of right and noncoronary cusps. Severe regurgitation (regurgitant fraction 59%) Tricuspid valve: Mild regurgitation Pulmonic valve: No regurgitation Aorta: Dilated ascending aorta measuring 39mm Pericardium: Small effusion IMPRESSION: 1. Bicuspid aortic valve with fusion of right and noncoronary cusps. Severe aortic regurgitation (regurgitant fraction 59%) 2.  Severe LV dilatation with moderate systolic dysfunction (EF 31%) 3.  Severe RV dilatation with moderate systolic dysfunction (EF 30%) 4. RV insertion site late gadolinium enhancement, which is a nonspecific finding often seen in setting of elevated pulmonary pressures 5. Dilated ascending aorta measuring 55mm Electronically Signed   By: Epifanio Lesches M.D.   On: 11/14/2021 09:02    Disposition   Pt is being discharged home today in good condition.  Follow-up Plans & Appointments     Follow-up Information     [facility] Follow up on 12/11/2021.   Specialty: Primary Care Why: 12:30 for hospital follow up, Contact information: [address] [place] [address] [phone]        [facility]. Go in 9 [name](s).   Specialty: Cardiology Why: Hospital follow  up PLEASE bring a current medication list to appointment FREE valet parking, Entrance C, off of [address] [employer] information: [address] [id number] mc [place] [address] [phone]                 Discharge Medications   Allergies as of 12/05/2021       Reactions   Avelox [moxifloxacin] Other (See Comments)   Thoracic Aneurysm.   Ciprofloxacin Other (See Comments)   Has thoracic Aneurysm   Penicillins Other (See Comments)   Patient states that mother is highly allergic. 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 Has patient had a PCN reaction causing immediate rash, facial/tongue/throat swelling, SOB or lightheadedness with hypotension: No Has patient had a PCN reaction causing severe rash involving mucus membranes or skin necrosis: No Has patient had a PCN reaction that required hospitalization Yes Has patient had a PCN reaction occurring within the last 10 years: No If all of the above answers are "N  Medication List     STOP taking these medications    fluticasone 50 MCG/ACT nasal spray Commonly known as: FLONASE   lisinopril 2.5 MG tablet Commonly known as: ZESTRIL       TAKE these medications    albuterol 108 (90 Base) MCG/ACT inhaler Commonly known as: Proventil HFA Inhale 2 puffs into the lungs once every 6 (six) hours as needed for wheezing or shortness of breath.   aspirin EC 81 MG tablet Take 81 mg by mouth daily. Swallow whole.   dapagliflozin propanediol 10 MG Tabs tablet Commonly known as: FARXIGA Take 1 tablet (10 mg total) by mouth daily. Start taking on: December 06, 2021   feeding supplement Liqd Take 237 mLs by mouth 2 (two) times daily between meals.   furosemide 20 MG tablet Commonly known as: LASIX Take 1 tablet (20 mg total) by mouth daily. Start taking on: December 06, 2021 What changed:  how much to take how to take this when to take this additional instructions    metoprolol succinate 25 MG 24 hr tablet Commonly known as: TOPROL-XL Take 1 tablet (25 mg total) by mouth daily. Start taking on: December 06, 2021   omeprazole 20 MG capsule Commonly known as: PRILOSEC Take 20 mg by mouth daily.   sacubitril-valsartan 24-26 MG Commonly known as: ENTRESTO Take 1 tablet by mouth 2 (two) times daily.   spironolactone 25 MG tablet Commonly known as: ALDACTONE Take 0.5 tablets (12.5 mg total) by mouth daily. Start taking on: December 06, 2021               Durable Medical Equipment  (From admission, onward)           Start     Ordered   12/05/21 1109  For home use only DME continuous positive airway pressure (CPAP)  Once       Comments: auto 5-20cm h20 with supplies and heated humidification  Question Answer Comment  Length of Need Lifetime   Patient has OSA or probable OSA Yes   Settings Other see comments   CPAP supplies needed Mask, headgear, cushions, filters, heated tubing and water chamber      12/05/21 1108               Outstanding Labs/Studies     Duration of Discharge Encounter   Greater than 30 minutes including physician time.  Signed, Jonita Albee, PA-C 12/05/2021, 3:45 PM   Patient seen and examined and agree with Robet Leu, PA-C as detailed above. Please see progress note from today.  Laurance Flatten, MD

## 2021-12-05 NOTE — TOC Transition Note (Addendum)
Transition of Care [facility]) - CM/SW Discharge Note   Patient Details  Name: Darius [name] MRN: [id number] Date of Birth: June 09, 1970  [facility] CM/SW Contact:  Leone Haven, RN Phone Number: 12/05/2021, 12:09 PM   Clinical Narrative:    Patient is for dc today, TOC to do meds for him. HF team is doing the patient ast for him.  [facility] [address]  Also this NCM did LOG for CPAP for patient for 30 day rentalfor $321. NCM informed patient that he will be responsible for the rest of the rental price, he says he understands.  He states he has transportation at [employer]. He states he wanted a rollator but informed him they do not have rollators for charity, he could get a regular rolling walker or cane, he states he does not want those. [employer] to screen for Medicaid.   Final next level of care: Home/Self Care Barriers to Discharge: No Barriers Identified   Patient Goals and CMS Choice Patient states their goals for this hospitalization and ongoing recovery are:: return home   Choice offered to / list presented to : NA  Discharge Placement                       Discharge Plan and Services                  DME Agency: NA       HH Arranged: NA          Social Determinants of Health (SDOH) Interventions Housing Interventions: Intervention Not Indicated Transportation Interventions: Other (Comment)   Readmission Risk Interventions     No data to display

## 2021-12-05 NOTE — Progress Notes (Signed)
Pt requesting for a refill of his albuterol inhaler. Will pass on day shift.

## 2021-12-05 NOTE — Telephone Encounter (Signed)
Heart Failure Patient Advocate Encounter  Completed application for [employer] Patient Assistance Program sent in an effort to reduce the patient's out of pocket expense for Entresto to $0.    Application completed and faxed to [phone].   [employer] patient assistance phone number for follow up is [phone].    [place] for [place] Program sent in an effort to reduce the patient's out of pocket expense for Farxiga to $0.    Application completed and faxed to [phone]   [place] patient [place] phone number for follow up is [phone].    No income attestation letter included with both applications.   Sharen Hones, PharmD, BCPS Heart Failure Stewardship Pharmacist Phone [phone]

## 2021-12-05 NOTE — Progress Notes (Signed)
Pre-AVR ultrasound study completed.  ° °Please see CV Proc for preliminary results.  ° °Earle Burson, RDMS, RVT ° °

## 2021-12-05 NOTE — Progress Notes (Signed)
Patient alert and oriented x4,denies pain but very anxious to leave the hospital . D/C instruction explain and copy given to the patient. Pt.verbalized understanding. All questions answered.

## 2021-12-09 NOTE — Progress Notes (Unsigned)
[address]       [place] [address]             [phone]        Darius [name] [facility] Medical Record #[id number] Date of Birth: December 09, 1970  Referring: No ref. provider found Primary Care: Pcp, No Primary Cardiologist:David Herbie [place], MD  Chief Complaint:   No chief complaint on file.   History of Present Illness:     ***   Past Medical and Surgical History: Previous Chest Surgery: *** Previous Chest Radiation: *** Diabetes Mellitus: ***.  HbA1C *** Creatinine: ***  Past Medical History:  Diagnosis Date   Aortic insufficiency due to bicuspid aortic valve 10/10/2021   Cardiac MRI 05/11/2021: Bicuspid aortic valve with fusion of the R & [place] cusps with severe AI (regurgitant fraction of 59%.   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  Severe AI 2/2 Bicusid AoV (R&[place] cusp fusion). Ascending Ao 42 mm   Valvular heart disease    a.) TTE on 12/19/2017 --> mild MV regurgitation; moderate AV regurgitation    Past Surgical History:  Procedure Laterality Date   Cardiac MRI  11/08/2021   Bicuspid aortic valve with fusion of the right and noncoronary cusps-severe AI (regurgitant fraction 59%); Severe biventricular dilation with moderate dysfunction (LVEF estimated 39%, RVEF estimated 30%); ascending aorta measuring 42 mm.   CYSTOSCOPY/URETEROSCOPY/HOLMIUM LASER/STENT PLACEMENT Right 01/29/2021   Procedure:  CYSTOSCOPY/URETEROSCOPY/HOLMIUM LASER/STENT PLACEMENT;  Surgeon: Riki Altes, MD;  Location: [facility] ORS;  Service: Urology;  Laterality: Right;   KIDNEY STONE SURGERY     05/2021   NO PAST SURGERIES     RIGHT/LEFT HEART CATH AND CORONARY ANGIOGRAPHY N/A 12/03/2021   Procedure: RIGHT/LEFT HEART CATH AND CORONARY ANGIOGRAPHY;  Surgeon: Lyn Records, MD;  Location: [facility] INVASIVE CV LAB;  Service: Cardiovascular;  Laterality: N/A;    Social History: Support: ***  Social History   Tobacco Use  Smoking Status Some Days   Packs/day: 0.25   Years: 35.00   Total pack years: 8.75   Types: Cigarettes   Last attempt to quit: 08/2020   Years since quitting: 1.2  Smokeless Tobacco Never  Tobacco Comments   Smokes 4-5 cigarettes per week.     Social History   Substance and Sexual Activity  Alcohol Use Yes   Alcohol/week: 2.0 standard drinks of alcohol   Types: 2 Cans of beer per week   Comment: socially     Allergies  Allergen Reactions   Avelox [Moxifloxacin] Other (See Comments)    Thoracic Aneurysm.   Ciprofloxacin Other (See Comments)    Has thoracic Aneurysm   Penicillins Other (See Comments)    Patient states that mother is highly allergic. 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  Has patient had a PCN reaction causing immediate rash, facial/tongue/throat swelling, SOB or lightheadedness with hypotension: No  Has patient had a PCN reaction causing severe rash involving mucus membranes or skin necrosis: No Has patient had a PCN reaction that required hospitalization Yes Has patient had a PCN reaction occurring within the last 10 years: No If all of the above answers are "N    Medications: Asprin: *** Statin: *** Beta Blocker: *** Ace Inhibitor: *** Anti-Coagulation: ***  Current Outpatient Medications  Medication Sig Dispense Refill   albuterol (PROVENTIL HFA) 108 (90 Base) MCG/ACT inhaler Inhale 2 puffs into the lungs once every 6 (six) hours as  needed for wheezing or shortness of breath. 18 g 3   aspirin EC 81 MG tablet Take 81 mg by mouth daily. Swallow whole.     dapagliflozin propanediol (FARXIGA) 10 MG TABS tablet Take 1 tablet (10 mg total) by mouth daily. 30 tablet 11   feeding supplement (ENSURE ENLIVE / ENSURE PLUS) LIQD Take 237 mLs by mouth 2 (two) times daily between meals. 237 mL 12   furosemide (LASIX) 20 MG tablet Take 1 tablet (20 mg total) by mouth daily. 90 tablet 3   metoprolol succinate (TOPROL-XL) 25 MG 24 hr tablet Take 1 tablet (25 mg total) by mouth daily. 90 tablet 3   omeprazole (PRILOSEC) 20 MG capsule Take 20 mg by mouth daily.     sacubitril-valsartan (ENTRESTO) 24-26 MG Take 1 tablet by mouth 2 (two) times daily. 60 tablet 11   spironolactone (ALDACTONE) 25 MG tablet Take 0.5 tablets (12.5 mg total) by mouth daily. 45 tablet 3   No current facility-administered medications for this visit.    (Not in a hospital admission)   Family History  Problem Relation Age of Onset   Hypertension Mother    Kidney disease Mother    Diabetes Mother    Mitral valve prolapse Mother    Diabetes Father    Hypertension Maternal Grandmother    Mitral valve prolapse Maternal Grandmother    Other Half-Brother        accident     Review of Systems:   ROS    Physical Exam: There were no vitals taken for this visit. Physical Exam    Diagnostic Studies & Laboratory data:    Left Heart Catherization:  Intervention  Echo: IMPRESSIONS     1. Bicuspid Aortic valve, No stenosis , Mild-Mod AI.   2. Bright peckled myocardium ,consider infiltrative process. Consider  Amyloid/Sarcoidosis.   3. Left ventricular ejection fraction, by estimation, is 40 to 45%. The  left ventricle has mildly decreased function. The left ventricle  demonstrates global hypokinesis. The left ventricular internal cavity size  was moderately to severely dilated. There   is mild concentric left ventricular hypertrophy. Left  ventricular  diastolic parameters are consistent with Grade III diastolic dysfunction  (restrictive).   4. Right ventricular systolic function is mildly reduced. The right  ventricular size is mildly enlarged. Mildly increased right ventricular  wall thickness.   5. Left atrial size was mildly dilated.   6. Right atrial size was mildly dilated.   7. The mitral valve is normal in structure. Trivial mitral valve  regurgitation.   8. The aortic valve is bicuspid. Aortic valve regurgitation is mild to  moderate. Aortic valve sclerosis is present, with no evidence of aortic  valve stenosis.   EKG: Sinus  Cardiac MRI: IMPRESSION: 1. Bicuspid aortic valve with fusion of right and noncoronary cusps. Severe aortic regurgitation (regurgitant fraction 59%)   2.  Severe LV dilatation with moderate systolic dysfunction (EF 31%)  3.  Severe RV dilatation with moderate systolic dysfunction (EF [id number])   4. RV insertion site late gadolinium enhancement, which is a nonspecific finding often seen in setting of elevated pulmonary pressures   5. Dilated ascending aorta measuring 27mm  I have independently reviewed the above radiologic studies and discussed with the patient   Recent Lab Findings: Lab Results  Component Value Date   WBC 4.9 12/03/2021   HGB 13.6 12/03/2021   HCT 40.0 12/03/2021   PLT 220 12/03/2021   GLUCOSE 102 (H) 12/05/2021   CHOL 167 09/16/2021   TRIG 71 09/16/2021   HDL 51 09/16/2021   LDLCALC 102 (H) 09/16/2021   ALT 93 (H) 09/15/2021   AST 77 (H) 09/15/2021   NA 139 12/05/2021   K 3.8 12/05/2021   CL 104 12/05/2021   CREATININE 1.36 (H) 12/05/2021   BUN 28 (H) 12/05/2021   CO2 27 12/05/2021   TSH 0.758 12/18/2017   HGBA1C 4.8 09/15/2021      Assessment / Plan:   51 yo male with bicuspid aortic valve, severe AI, and biventricular dysfunction.  Will plane for mAVR.  Possible mechanical support.     I  spent {CHL ONC TIME VISIT - WR:[id number]  counseling the patient face to face.   Lajuana Matte 12/09/2021 12:08 PM

## 2021-12-10 ENCOUNTER — Telehealth (HOSPITAL_COMMUNITY): Payer: Self-pay

## 2021-12-10 ENCOUNTER — Telehealth: Payer: Self-pay | Admitting: Cardiology

## 2021-12-10 ENCOUNTER — Other Ambulatory Visit: Payer: Self-pay

## 2021-12-10 NOTE — Telephone Encounter (Signed)
Patient will need printed and signed RX- I thought they had a escribe option, but they didn't. Can you have Dr.Harding sign and fax it.  Thank you!

## 2021-12-10 NOTE — Telephone Encounter (Signed)
Pt c/o medication issue:  1. Name of Medication: sacubitril-valsartan (ENTRESTO) 24-26 MG  2. How are you currently taking this medication (dosage and times per day)?   3. Are you having a reaction (difficulty breathing--STAT)?   4. What is your medication issue? Brianna from [employer] received a provider application for this medication by Dr. Shari Prows from when pt was in the ED and they need a stand alone prescription for this medication faxed over to number below.   Fax number: [phone]

## 2021-12-11 ENCOUNTER — Telehealth: Payer: Self-pay | Admitting: Family

## 2021-12-11 ENCOUNTER — Ambulatory Visit: Payer: Self-pay | Admitting: Gerontology

## 2021-12-11 NOTE — Congregational Nurse Program (Signed)
  Dept: [phone]   Congregational Nurse Program Note  Date of Encounter: 12/11/2021 RN assisted client with paperwork for medication management. He reports having a telephone interview today for his disability and has all his paperwork. No other needs at this time. Past Medical History: Past Medical History:  Diagnosis Date   Aortic insufficiency due to bicuspid aortic valve 10/10/2021   Cardiac MRI 05/11/2021: Bicuspid aortic valve with fusion of the R & [place] cusps with severe AI (regurgitant fraction of 59%.   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  Severe AI 2/2 Bicusid AoV (R&[place] cusp fusion). Ascending Ao 42 mm   Valvular heart disease    a.) TTE on 12/19/2017 --> mild MV regurgitation; moderate AV regurgitation    Encounter Details:  CNP Questionnaire - 12/11/21 1147       Questionnaire   Do you give verbal consent to treat you today? Yes    Location Patient Served  [place]    Visit Setting Church or Organization    Patient Status Unknown   client lives in a boarding house   Insurance Uninsured ([employer]/Self-Pay)   he reports he ahs applied for [employer] Referral N/A   client has applied for Medicaid per his report   Medication N/A    Medical Provider Yes   [facility]   Screening Referrals N/A    Medical Referral N/A    Medical Appointment Made N/A   assisted client  with rescheduling his Cardiology apt that was today.   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 *Does need assistance depending on timing of appointments   Housing/Utilities N/A    Interpersonal Safety N/A    Intervention Support;Case Management    ED Visit Averted N/A    Life-Saving Intervention Made N/A

## 2021-12-12 MED ORDER — SACUBITRIL-VALSARTAN 24-26 MG PO TABS: 1.0000 | ORAL_TABLET | Freq: Two times a day (BID) | ORAL | 3 refills | Status: DC

## 2021-12-12 NOTE — Telephone Encounter (Signed)
Paper RX for Entresto 24/26 mg BID printed. Awaiting Dr. Elissa Hefty signature when he is back in the office on 12/19/21.

## 2021-12-13 ENCOUNTER — Other Ambulatory Visit: Payer: Self-pay

## 2021-12-13 ENCOUNTER — Telehealth (HOSPITAL_COMMUNITY): Payer: Self-pay | Admitting: *Deleted

## 2021-12-13 ENCOUNTER — Encounter: Payer: Medicaid Other | Admitting: Thoracic Surgery (Cardiothoracic Vascular Surgery)

## 2021-12-13 ENCOUNTER — Encounter: Payer: Self-pay | Admitting: Thoracic Surgery (Cardiothoracic Vascular Surgery)

## 2021-12-13 ENCOUNTER — Encounter (HOSPITAL_COMMUNITY): Payer: Self-pay

## 2021-12-13 ENCOUNTER — Ambulatory Visit (HOSPITAL_COMMUNITY)
Admit: 2021-12-13 | Discharge: 2021-12-13 | Disposition: A | Discharge status: Home / Self Care | Payer: Medicaid Other | Attending: Physician Assistant | Admitting: Physician Assistant

## 2021-12-13 VITALS — BP 108/64 | HR 68 | Wt 153.4 lb

## 2021-12-13 DIAGNOSIS — I7121 Aneurysm of the ascending aorta, without rupture: Secondary | ICD-10-CM | POA: Insufficient documentation

## 2021-12-13 DIAGNOSIS — I428 Other cardiomyopathies: Secondary | ICD-10-CM | POA: Diagnosis not present

## 2021-12-13 DIAGNOSIS — I5042 Chronic combined systolic (congestive) and diastolic (congestive) heart failure: Secondary | ICD-10-CM | POA: Insufficient documentation

## 2021-12-13 DIAGNOSIS — F1290 Cannabis use, unspecified, uncomplicated: Secondary | ICD-10-CM | POA: Insufficient documentation

## 2021-12-13 DIAGNOSIS — F1410 Cocaine abuse, uncomplicated: Secondary | ICD-10-CM | POA: Insufficient documentation

## 2021-12-13 DIAGNOSIS — E119 Type 2 diabetes mellitus without complications: Secondary | ICD-10-CM | POA: Diagnosis not present

## 2021-12-13 DIAGNOSIS — Q231 Congenital insufficiency of aortic valve: Secondary | ICD-10-CM | POA: Insufficient documentation

## 2021-12-13 DIAGNOSIS — Z79899 Other long term (current) drug therapy: Secondary | ICD-10-CM | POA: Insufficient documentation

## 2021-12-13 DIAGNOSIS — Z7984 Long term (current) use of oral hypoglycemic drugs: Secondary | ICD-10-CM | POA: Insufficient documentation

## 2021-12-13 DIAGNOSIS — I110 Hypertensive heart disease with heart failure: Secondary | ICD-10-CM | POA: Diagnosis present

## 2021-12-13 DIAGNOSIS — F1910 Other psychoactive substance abuse, uncomplicated: Secondary | ICD-10-CM

## 2021-12-13 DIAGNOSIS — I5022 Chronic systolic (congestive) heart failure: Secondary | ICD-10-CM

## 2021-12-13 LAB — BASIC METABOLIC PANEL
Anion gap: 6 (ref 5–15)
BUN: 17 mg/dL (ref 6–20)
CO2: 25 mmol/L (ref 22–32)
Calcium: 9.3 mg/dL (ref 8.9–10.3)
Chloride: 110 mmol/L (ref 98–111)
Creatinine, Ser: 1.39 mg/dL — ABNORMAL HIGH (ref 0.61–1.24)
GFR, Estimated: >60 mL/min (ref >60)
Glucose, Bld: 130 mg/dL — ABNORMAL HIGH (ref 70–99)
Potassium: 3.9 mmol/L (ref 3.5–5.1)
Sodium: 141 mmol/L (ref 135–145)

## 2021-12-13 LAB — BRAIN NATRIURETIC PEPTIDE: B Natriuretic Peptide: 3596.1 pg/mL — ABNORMAL HIGH (ref 0.0–100.0)

## 2021-12-13 MED ORDER — SACUBITRIL-VALSARTAN 24-26 MG PO TABS: 1.0000 | ORAL_TABLET | Freq: Two times a day (BID) | ORAL | 3 refills | Status: DC

## 2021-12-13 MED ORDER — SPIRONOLACTONE 25 MG PO TABS
25.0000 mg | ORAL_TABLET | Freq: Every day | ORAL | 3 refills | Status: DC
  Filled 2021-12-13 – 2021-12-20 (×2): qty 45, 45d supply, fill #0

## 2021-12-13 NOTE — Patient Instructions (Addendum)
Great to see you today! Please take extra 20 mg Furosemide to equal 40 mg  x 2 days. Continue Spironolactone to 25 mg daily.  Labs done today --we will only call with abnormal values  Follow-up Labwork scheduled for Friday August 25th at [facility]  (Use [facility] entrance)   Keep follow-up Heart Failure Clinic appts as scheduled.  At the [facility], you and your health needs are our priority. We have a designated team specialized in the treatment of Heart Failure. This Care Team includes your primary Heart Failure Specialized Cardiologist (physician), Advanced Practice Providers (APPs- Physician Assistants and Nurse Practitioners), and Pharmacist who all work together to provide you with the care you need, when you need it.   You may see any of the following providers on your designated Care Team at your next follow up:  Dr Arvilla Meres Dr Carron Curie, NP Robbie Lis, [place] [facility] [place], [place] Karle Plumber, PharmD   Please be sure to bring in all your medications bottles to every appointment.   Need to Contact us:  If you have any questions or concerns before your next appointment please send [place] a message through [place] or call our office at [phone].    TO LEAVE A MESSAGE FOR THE NURSE SELECT OPTION 2, PLEASE LEAVE A MESSAGE INCLUDING: YOUR NAME DATE OF BIRTH CALL BACK NUMBER REASON FOR CALL**this is important as we prioritize the call backs  YOU WILL RECEIVE A CALL BACK THE SAME DAY AS LONG AS YOU CALL BEFORE 4:00 PM

## 2021-12-13 NOTE — Telephone Encounter (Signed)
Called to confirm Heart & Vascular Transitions of Care appointment at 11 am on 12/13/2021. Patient reminded to bring all medications and pill box organizer with them. Confirmed patient has transportation. Gave directions, instructed to utilize valet parking.  Confirmed appointment prior to ending call.     Rhae Hammock, BSN, Scientist, clinical (histocompatibility and immunogenetics) Only

## 2021-12-13 NOTE — Progress Notes (Signed)
H&V Care Navigation CSW Progress Note  Clinical Social Worker met with patient to discuss PCP and lack of insurance.  Pt reports he actually does have PCP through [facility] but hasn't seen in about 6 months due to issues with getting seen promptly once he arrives.  CSW discussed pt seeing [place] providers instead but he is agreeable to follow back up with [facility].  CSW also provided with CAFA to help with [facility] bills- states he thinks he already filled it out and turned it in yesterday but will check with the woman who was helping with the application to make sure that was it.  SDOH Screenings   Alcohol Screen: Low Risk  (11/21/2020)   Alcohol Screen    Last Alcohol Screening Score (AUDIT): 1  Depression (PHQ2-9): Low Risk  (07/12/2021)   Depression (PHQ2-9)    PHQ-2 Score: 1  Financial Resource Strain: High Risk (12/13/2021)   Overall Financial Resource Strain (CARDIA)    Difficulty of Paying Living Expenses: Very hard  Food Insecurity: No Food Insecurity (10/31/2021)   Hunger Vital Sign    Worried About Running Out of Food in the Last Year: Never true    Ran Out of Food in the Last Year: Never true  Housing: Medium Risk (12/04/2021)   Housing    Last Housing Risk Score: 1  Physical Activity: Unknown (03/01/2018)   Exercise Vital Sign    Days of Exercise per Week: 7 days    Minutes of Exercise per Session: Patient refused  Social Connections: Moderately Isolated (03/01/2018)   Social Connection and Isolation Panel [NHANES]    Frequency of Communication with Friends and Family: Never    Frequency of Social Gatherings with Friends and Family: Never    Attends Religious Services: Never    Marine scientist or Organizations: No    Attends Archivist Meetings: Never    Marital Status: Married  Stress: Stress Concern Present (03/01/2018)   [employer] [place]    Feeling of Stress : Very much  Tobacco Use:  High Risk (12/13/2021)   Patient History    Smoking Tobacco Use: Some Days    Smokeless Tobacco Use: Never    Passive Exposure: Not on file  Transportation Needs: Unmet Transportation Needs (12/04/2021)   PRAPARE - Transportation    Lack of Transportation (Medical): Yes    Lack of Transportation (Non-Medical): Yes   No further needs at this time  Jorge Ny, [facility] Desk#: [phone] Cell#: [phone]

## 2021-12-13 NOTE — Progress Notes (Addendum)
5r    HEART & VASCULAR TRANSITION OF CARE CONSULT NOTE     Referring Physician: Dr. Shari Prows Primary Care: No Primary Cardiologist: Dr. Herbie Baltimore  HPI: Referred to clinic by Dr. Shari Prows with [facility] for heart failure consultation. 51 y.o. male with history of chronic systolic CHF/NICM, bicuspid aortic valve with severe AI, polysubstance abuse (cocaine, ETOH, tobacco, marijuana), DM II.   Echo 11/2017: EF 60-65%, bicuspid aortic valve with mild to moderate AI  Echo 05/23: EF 40-45%, LV moderate to severely dilated, mild LVH, mild to moderate AI, bright speckled myocardium raising concern for infiltrative process, mildly reduced RV  cMRI 11/08/21: Severely dilated LV with EF 31%, severely dilated RV with moderately reduced function (RVEF 30%), nonspecific RV insertion LGE which can be seen in setting of elevated pulmonary pressures, bicuspid aortic valve with severe AI, dilated ascending aorta measuring 42 mm  He was admitted 12/02/21 with a/c CHF. BNP > 3,000. Had been eating out and consuming heavy alcohol prior to admission. R/LHC: No CAD, RA 6, PA 49/24 (33), PCWP 26, LVEDP 41, Fick CO/CI 4.03/2.08. He was diuresed with IV lasix. GDMT optimized. Case reviewed with TCTS and was scheduled for outpatient visit for consideration of AVR.  He is here today for hospital follow-up. Reports dyspnea with heavier exertion such as climbing a couple of flights of stairs. No orthopnea, PND or LE edema. His weight when he got home was 151 lb, batteries in his scale died a few days after he got home. Weight 153 lb today. Taking all of his medications correctly except taking 1 tablet spiro daily instead of 1/2 tablet daily.   Has not used cocaine since discharge (previously used 3 X wk for 20 years). He's had 1 beer and vaped several times. Trying to quit using marijuana.  Denies family history of CHF. However, reports his mother has a heart issue and passed out during a track meet in high  school.   Review of Systems: [y] = yes, [ ]  = no   General: Weight gain [ ] ; Weight loss [ ] ; Anorexia [ ] ; Fatigue [ ] ; Fever [ ] ; Chills [ ] ; Weakness [ ]   Cardiac: Chest pain/pressure [ ] ; Resting SOB [ ] ; Exertional SOB [Y ]; Orthopnea [ ] ; Pedal Edema [ ] ; Palpitations [ ] ; Syncope [ ] ; Presyncope [ ] ; Paroxysmal nocturnal dyspnea[ ]   Pulmonary: Cough [ ] ; Wheezing[ ] ; Hemoptysis[ ] ; Sputum [ ] ; Snoring [ ]   GI: Vomiting[ ] ; Dysphagia[ ] ; Melena[ ] ; Hematochezia [ ] ; Heartburn[ ] ; Abdominal pain [ ] ; Constipation [ ] ; Diarrhea [ ] ; BRBPR [ ]   GU: Hematuria[ ] ; Dysuria [ ] ; Nocturia[ ]   Vascular: Pain in legs with walking [ ] ; Pain in feet with lying flat [ ] ; Non-healing sores [ ] ; Stroke [ ] ; TIA [ ] ; Slurred speech [ ] ;  Neuro: Headaches[ ] ; Vertigo[ ] ; Seizures[ ] ; Paresthesias[ ] ;Blurred vision [ ] ; Diplopia [ ] ; Vision changes [ ]   Ortho/Skin: Arthritis [ ] ; Joint pain [ ] ; Muscle pain [ ] ; Joint swelling [ ] ; Back Pain [ ] ; Rash [ ]   Psych: Depression[ ] ; Anxiety[Y]  Heme: Bleeding problems [ ] ; Clotting disorders [ ] ; Anemia [ ]   Endocrine: Diabetes [Y]; Thyroid dysfunction[ ]    Past Medical History:  Diagnosis Date   Aortic insufficiency due to bicuspid aortic valve 10/10/2021   Cardiac MRI 05/11/2021: Bicuspid aortic valve with fusion of the R & [place] cusps with severe AI (regurgitant fraction of 59%.   Chronic HFpEF  a.) TTE on 12/19/2017 --> LVEF 60-65%, no RWMAs, mild LA dilitation, PASP 44 mmHg   COPD (chronic obstructive pulmonary disease) (HCC)    Glaucoma    History of 2019 novel coronavirus disease (COVID-19) 09/15/2020   Hypertension    Nephrolithiasis    Polysubstance abuse (HCC)    ETOH, marijuana, cocaine   Seizures (HCC)    T2DM (type 2 diabetes mellitus) (HCC)    a.)  Controlled with diet lifestyle modifications   Valvular cardiomyopathy (HCC) 12/19/2017   Cardiac MRI 11/08/2021: Severe biventricular dilation with moderate to severe dysfunction: LVEF ~31,  RVEF ~30%.  Severe AI 2/2 Bicusid AoV (R&[place] cusp fusion). Ascending Ao 42 mm   Valvular heart disease    a.) TTE on 12/19/2017 --> mild MV regurgitation; moderate AV regurgitation    Current Outpatient Medications  Medication Sig Dispense Refill   albuterol (PROVENTIL HFA) 108 (90 Base) MCG/ACT inhaler Inhale 2 puffs into the lungs once every 6 (six) hours as needed for wheezing or shortness of breath. 18 g 3   dapagliflozin propanediol (FARXIGA) 10 MG TABS tablet Take 1 tablet (10 mg total) by mouth daily. 30 tablet 11   feeding supplement (ENSURE ENLIVE / ENSURE PLUS) LIQD Take 237 mLs by mouth 2 (two) times daily between meals. 237 mL 12   furosemide (LASIX) 20 MG tablet Take 1 tablet (20 mg total) by mouth daily. 90 tablet 3   metoprolol succinate (TOPROL-XL) 25 MG 24 hr tablet Take 1 tablet (25 mg total) by mouth daily. 90 tablet 3   sacubitril-valsartan (ENTRESTO) 24-26 MG Take 1 tablet by mouth 2 (two) times daily. 180 tablet 3   spironolactone (ALDACTONE) 25 MG tablet Take 0.5 tablets (12.5 mg total) by mouth daily. 45 tablet 3   aspirin EC 81 MG tablet Take 81 mg by mouth daily. Swallow whole. (Patient not taking: Reported on 12/13/2021)     No current facility-administered medications for this encounter.    Allergies  Allergen Reactions   Avelox [Moxifloxacin] Other (See Comments)    Thoracic Aneurysm.   Ciprofloxacin Other (See Comments)    Has thoracic Aneurysm   Penicillins Other (See Comments)    Patient states that mother is highly allergic. 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  Has patient had a PCN reaction causing immediate rash, facial/tongue/throat swelling, SOB or lightheadedness with hypotension: No Has patient had a PCN reaction causing severe rash involving mucus membranes or skin necrosis: No Has patient had a PCN reaction that required hospitalization Yes Has patient had a PCN reaction occurring within the last 10 years: No If all of the  above answers are "N      Social History   Socioeconomic History   Marital status: Legally Separated    Spouse name: Jasmine December   Number of children: 2   Years of education: Not on file   Highest education level: GED or equivalent  Occupational History   Occupation: disability  Tobacco Use   Smoking status: Some Days    Packs/day: 0.25    Years: 35.00    Total pack years: 8.75    Types: Cigarettes    Last attempt to quit: 08/2020    Years since quitting: 1.2   Smokeless tobacco: Never   Tobacco comments:    Smokes 4-5 cigarettes per week.   Vaping Use   Vaping Use: Former   Quit date: 05/12/2021   Substances: Nicotine, Flavoring  Substance and Sexual Activity   Alcohol  use: Yes    Alcohol/week: 2.0 standard drinks of alcohol    Types: 2 Cans of beer per week    Comment: socially   Drug use: Yes    Types: Marijuana, Other-see comments, "Crack" cocaine    Comment: last use of crack cocaine 01/2021 current use of marijuana   Sexual activity: Yes  Other Topics Concern   Not on file  Social History Narrative      > From cardiology clinic note on October 10, 2021:      He is "doing the best he can "to cut out cigarettes and drugs.  He is down to about 5 cigarettes a week-not buying any, and and only smoking but he can borrow.  However he was with some friends a couple days ago and was smoking some marijuana that "unbeknownst to him "was laced with "powder ".  He had a little bit of heart fast heart rates and some discomfort in his chest after smoking "blunt".   Social Determinants of Health   Financial Resource Strain: High Risk (10/31/2021)   Overall Financial Resource Strain (CARDIA)    Difficulty of Paying Living Expenses: Very hard  Food Insecurity: No Food Insecurity (10/31/2021)   Hunger Vital Sign    Worried About Running Out of Food in the Last Year: Never true    Ran Out of Food in the Last Year: Never true  Transportation Needs: Unmet Transportation Needs (12/04/2021)    PRAPARE - Administrator, Civil Service (Medical): Yes    Lack of Transportation (Non-Medical): Yes  Physical Activity: Unknown (03/01/2018)   Exercise Vital Sign    Days of Exercise per Week: 7 days    Minutes of Exercise per Session: Patient refused  Stress: Stress Concern Present (03/01/2018)   [employer] of Occupational Health - Occupational Stress Questionnaire    Feeling of Stress : Very much  Social Connections: Moderately Isolated (03/01/2018)   Social Connection and Isolation Panel [NHANES]    Frequency of Communication with Friends and Family: Never    Frequency of Social Gatherings with Friends and Family: Never    Attends Religious Services: Never    Database administrator or Organizations: No    Attends Banker Meetings: Never    Marital Status: Married  Catering manager Violence: Not At Risk (03/01/2018)   Humiliation, Afraid, Rape, and Kick questionnaire    Fear of Current or Ex-Partner: No    Emotionally Abused: No    Physically Abused: No    Sexually Abused: No      Family History  Problem Relation Age of Onset   Hypertension Mother    Kidney disease Mother    Diabetes Mother    Mitral valve prolapse Mother    Diabetes Father    Hypertension Maternal Grandmother    Mitral valve prolapse Maternal Grandmother    Other Half-Brother        accident    Vitals:   12/13/21 1048  BP: 108/64  Pulse: 68  SpO2: 97%  Weight: 69.6 kg (153 lb 6.4 oz)    PHYSICAL EXAM: General:  Thin AAM, no distress. HEENT: normal Neck: supple. JVP 8-10 cm. Carotids 2+ bilat; no bruits.  Cor: PMI nondisplaced. Regular rate & rhythm. No rubs, gallops or murmurs. Lungs: clear Abdomen: soft, nontender, nondistended.  Extremities: no cyanosis, clubbing, rash, edema, + varicose veins Neuro: alert & oriented x 3, cranial nerves grossly intact. moves all 4 extremities w/o difficulty. Affect pleasant.  ASSESSMENT & PLAN: Chronic systolic CHF/NICM -Echo  11/2017: EF 60-65%, mild to moderate AI -Echo 05/23: EF 40-45%, LV moderate to severely dilated, mild LVH, mild to moderate AI, bright speckled myocardium raising concern for infiltrative process, mildly reduced RV -cMRI 11/08/21: Severely dilated LV with EF 31%, severely dilated RV with moderately reduced function (RVEF 30%), nonspecific RV insertion LGE which can be seen in setting of elevated pulmonary pressures, bicuspid aortic valve with severe AI, dilated ascending aorta measuring 42 mm -R/LHC 08/23: No CAD, RA 6, PA 49/24 (33), PCWP 26, LVEDP 41, Fick CO/CI 4.03/2.08 -Etiology cardiomyopathy likely valvular disease +/- component of cocaine and ETOH abuse. Has upcoming appointment with TCTS for consideration of AVR -NYHA II. Volume mildly elevated today. ReDS 38%. Takes 20 mg furosemide daily, will have him take an extra 20 mg X 2 days -Continue metoprolol xl 25 mg daily -Continue Entresto 24/26 mg BID -Continue spiro 25 mg daily -Continue Farxiga 10 mg daily -No BP room to further titrate meds -Labs today and BMET again in 1 week -Needs to refrain from ETOH and cocaine  Bicuspid aortic valve/severe aortic insufficiency -Most recent echo as above -Has appointment with TCTS for consideration of AVR -Discussed screening of 1st degree relatives  Ascending aortic aneurysm -Measured 4 cm on CT chest 12/22 -Continue beta blocker -F/u per Cardiology  Polysubstance abuse -Tobacco, ETOH, marijuana and cocaine -Complete cessation discussed. Has not used cocaine since discharge. Working on refraining from other substances   NYHA II GDMT  Diuretic-Lasix 20 mg daily, take an extra tablet X 2 days BB-Metoprolol xl 25 mg daily Ace/ARB/ARNI-Entresto 24/26 mg BID MRA-Spiro 25 mg daily SGLT2i-Farxiga 10 mg daily    Referred to HFSW (PCP, Medications, Transportation, ETOH Abuse, Drug Abuse, Insurance, Financial ): Yes, refer to PCP, assist with Medicaid (currently pending) Refer to  Pharmacy: Yes, assistance for Marcelline Deist and Entresto Refer to Home Health: No Refer to Advanced Heart Failure Clinic: Yes Refer to General Cardiology: No, already established  Follow up: Keep f/u with Dr. Herbie Baltimore in September. Will continue to follow patient in Advanced Heart Failure Clinic until after AVR. Can eventually graduate from our clinic if LV function improves. F/u APP 6 weeks and Dr. Gala Romney 2-3 months

## 2021-12-13 NOTE — Progress Notes (Signed)
[facility] Transitions of Care Clinic Heart Failure Pharmacist Encounter  PCP: Pcp, No PCP-Cardiologist: Bryan Lemma, MD  Contacted [employer] patient assistance to follow up on Entresto application sent on 12/05/21. They have sent a no-income attestation letter to his house that needs to be signed and sent in. They also have not received the faxed prescription that was noted previously. Informed patient about the letter and will fax in prescription today.  Contacted [employer] patient assistance to follow up on Farxiga application sent on 12/05/21. They need a Medicaid denial form before approving him for assistance. They no longer offer temporary assistance pending Medicaid determination. Spoke with [place], CSW to discuss options. Relayed this information to the patient and informed him that we can offer HF fund, he may be able to get this from med management clinic, or try [employer] patient assistance if needed. Eileen Stanford is contacting med management clinic to get more info.  Sharen Hones, PharmD, BCPS Heart Failure Transitions of Care Clinic Pharmacist [phone]

## 2021-12-19 NOTE — Telephone Encounter (Signed)
Paper RX signed by Dr. Herbie Baltimore for Sherryll Burger.  I have also received a fax from [employer] stating they received the patient's application, but are missing:  Missing proof of income please attach IRS form ( e.g. 1040, etc).   Attempted to call the patient to request this information. No answer- I left a message on his identified voice mail to please call back to discuss what else is needed to process his application for assistance.

## 2021-12-19 NOTE — Telephone Encounter (Signed)
I have gone ahead and faxed the paper RX to [employer] for Entresto 24/26 mg BID.  Faxed to [phone] Fax confirmation received.

## 2021-12-20 ENCOUNTER — Ambulatory Visit (HOSPITAL_COMMUNITY): Admit: 2021-12-20 | Payer: MEDICAID | Admitting: Cardiology

## 2021-12-20 ENCOUNTER — Institutional Professional Consult (permissible substitution) (INDEPENDENT_AMBULATORY_CARE_PROVIDER_SITE_OTHER): Payer: Self-pay | Admitting: Thoracic Surgery (Cardiothoracic Vascular Surgery)

## 2021-12-20 ENCOUNTER — Encounter: Payer: Self-pay | Admitting: *Deleted

## 2021-12-20 ENCOUNTER — Encounter: Payer: Medicaid Other | Admitting: Thoracic Surgery (Cardiothoracic Vascular Surgery)

## 2021-12-20 ENCOUNTER — Other Ambulatory Visit: Payer: Self-pay | Admitting: *Deleted

## 2021-12-20 ENCOUNTER — Other Ambulatory Visit: Payer: Self-pay

## 2021-12-20 ENCOUNTER — Encounter (HOSPITAL_COMMUNITY): Payer: Self-pay

## 2021-12-20 VITALS — BP 96/53 | HR 63 | Resp 20 | Ht 74.0 in | Wt 153.0 lb

## 2021-12-20 DIAGNOSIS — I77810 Thoracic aortic ectasia: Secondary | ICD-10-CM

## 2021-12-20 DIAGNOSIS — Q231 Congenital insufficiency of aortic valve: Secondary | ICD-10-CM

## 2021-12-20 SURGERY — RIGHT/LEFT HEART CATH AND CORONARY ANGIOGRAPHY
Anesthesia: LOCAL

## 2021-12-20 NOTE — Progress Notes (Signed)
[address]       [place] [address]             [phone]                                        Darius [name] [facility] Medical Record #[id number] Date of Birth: [birth year]   Referring: No ref. provider found Primary Care: Pcp, No Primary Cardiologist:David Herbie Baltimore, MD   Chief Complaint:   No chief complaint on file.     History of Present Illness:     51 year old male presents for surgical evaluation of severe aortic valve regurgitation, and biventricular failure.  Over the last several years she has had worsening symptoms of exertional dyspnea and heart failure symptoms.  He has been admitted multiple times with heart failure exacerbations.  He subsequently underwent an MRI which appropriately calculated the regurgitant flow across the aortic valve.  At his last admission he was diuresed, and has done well at home.     Past Medical and Surgical History: Previous Chest Surgery: No Previous Chest Radiation: No Diabetes Mellitus: No.  HbA1C 4.8 Creatinine: 1.36       Past Medical History:  Diagnosis Date   Aortic insufficiency due to bicuspid aortic valve 10/10/2021    Cardiac MRI 05/11/2021: Bicuspid aortic valve with fusion of the R & [place] cusps with severe AI (regurgitant fraction of 59%.   Chronic HFpEF      a.) TTE on 12/19/2017 --> LVEF 60-65%, no RWMAs, mild LA dilitation, PASP 44 mmHg   COPD (chronic obstructive pulmonary disease) (HCC)     Glaucoma     History of 2019 novel coronavirus disease (COVID-19) 09/15/2020   Hypertension     Nephrolithiasis     Polysubstance abuse (HCC)      ETOH, marijuana, cocaine   Seizures (HCC)     T2DM (type 2 diabetes mellitus) (HCC)      a.)  Controlled with diet lifestyle modifications   Valvular cardiomyopathy (HCC) 12/19/2017    Cardiac MRI 11/08/2021: Severe biventricular dilation with moderate to severe dysfunction: LVEF ~31, RVEF ~30%.  Severe AI 2/2 Bicusid AoV (R&[place] cusp fusion). Ascending Ao 42 mm    Valvular heart disease      a.) TTE on 12/19/2017 --> mild MV regurgitation; moderate AV regurgitation           Past Surgical History:  Procedure Laterality Date   Cardiac MRI   11/08/2021    Bicuspid aortic valve with fusion of the right and noncoronary cusps-severe AI (regurgitant fraction 59%); Severe biventricular dilation with moderate dysfunction (LVEF estimated 39%, RVEF estimated 30%); ascending aorta measuring 42 mm.   CYSTOSCOPY/URETEROSCOPY/HOLMIUM LASER/STENT PLACEMENT Right 01/29/2021    Procedure: CYSTOSCOPY/URETEROSCOPY/HOLMIUM LASER/STENT PLACEMENT;  Surgeon: Riki Altes, MD;  Location: [facility] ORS;  Service: Urology;  Laterality: Right;   KIDNEY STONE SURGERY        05/2021   NO PAST SURGERIES       RIGHT/LEFT HEART CATH AND CORONARY ANGIOGRAPHY N/A 12/03/2021    Procedure: RIGHT/LEFT HEART CATH AND CORONARY ANGIOGRAPHY;  Surgeon: Lyn Records, MD;  Location: [facility] INVASIVE CV LAB;  Service: Cardiovascular;  Laterality: N/A;      Social History: Support: Presents today with his wife   Social History        Tobacco Use  Smoking Status Some Days  Packs/day: 0.25   Years: 35.00   Total pack years: 8.75   Types: Cigarettes   Last attempt to quit: 08/2020   Years since quitting: 1.2  Smokeless Tobacco Never  Tobacco Comments    Smokes 4-5 cigarettes per week.     Social History        Substance and Sexual Activity  Alcohol Use Yes   Alcohol/week: 2.0 standard drinks of alcohol   Types: 2 Cans of beer per week    Comment: socially             Allergies  Allergen Reactions   Avelox [Moxifloxacin] Other (See Comments)      Thoracic Aneurysm.   Ciprofloxacin Other (See Comments)      Has thoracic Aneurysm   Penicillins Other (See Comments)      Patient states that mother is highly allergic. 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   Has patient had a PCN reaction causing immediate rash, facial/tongue/throat swelling, SOB or  lightheadedness with hypotension: No Has patient had a PCN reaction causing severe rash involving mucus membranes or skin necrosis: No Has patient had a PCN reaction that required hospitalization Yes Has patient had a PCN reaction occurring within the last 10 years: No If all of the above answers are "N               Current Outpatient Medications  Medication Sig Dispense Refill   albuterol (PROVENTIL HFA) 108 (90 Base) MCG/ACT inhaler Inhale 2 puffs into the lungs once every 6 (six) hours as needed for wheezing or shortness of breath. 18 g 3   aspirin EC 81 MG tablet Take 81 mg by mouth daily. Swallow whole.       dapagliflozin propanediol (FARXIGA) 10 MG TABS tablet Take 1 tablet (10 mg total) by mouth daily. 30 tablet 11   feeding supplement (ENSURE ENLIVE / ENSURE PLUS) LIQD Take 237 mLs by mouth 2 (two) times daily between meals. 237 mL 12   furosemide (LASIX) 20 MG tablet Take 1 tablet (20 mg total) by mouth daily. 90 tablet 3   metoprolol succinate (TOPROL-XL) 25 MG 24 hr tablet Take 1 tablet (25 mg total) by mouth daily. 90 tablet 3   omeprazole (PRILOSEC) 20 MG capsule Take 20 mg by mouth daily.       sacubitril-valsartan (ENTRESTO) 24-26 MG Take 1 tablet by mouth 2 (two) times daily. 60 tablet 11   spironolactone (ALDACTONE) 25 MG tablet Take 0.5 tablets (12.5 mg total) by mouth daily. 45 tablet 3    No current facility-administered medications for this visit.      (Not in a hospital admission)          Family History  Problem Relation Age of Onset   Hypertension Mother     Kidney disease Mother     Diabetes Mother     Mitral valve prolapse Mother     Diabetes Father     Hypertension Maternal Grandmother     Mitral valve prolapse Maternal Grandmother     Other Half-Brother          accident        Review of Systems:    Review of Systems  Constitutional:  Positive for malaise/fatigue and weight loss. Negative for fever.  Respiratory:  Positive for shortness of  breath. Negative for cough.   Cardiovascular:  Positive for leg swelling. Negative for chest pain and palpitations.  Neurological: Negative.   Psychiatric/Behavioral:  The patient is nervous/anxious.                    Physical Exam: Vitals:   12/20/21 1204  BP: (!) 96/53  Pulse: 63  Resp: 20  SpO2: 99%    Physical Exam   Alert NAD Regular Nonlabored breathing ND abdomen No peripheral edema   Diagnostic Studies & Laboratory data:    Left Heart Catherization:  Intervention   Echo: IMPRESSIONS     1. Bicuspid Aortic valve, No stenosis , Mild-Mod AI.   2. Bright peckled myocardium ,consider infiltrative process. Consider  Amyloid/Sarcoidosis.   3. Left ventricular ejection fraction, by estimation, is 40 to 45%. The  left ventricle has mildly decreased function. The left ventricle  demonstrates global hypokinesis. The left ventricular internal cavity size  was moderately to severely dilated. There   is mild concentric left ventricular hypertrophy. Left ventricular  diastolic parameters are consistent with Grade III diastolic dysfunction  (restrictive).   4. Right ventricular systolic function is mildly reduced. The right  ventricular size is mildly enlarged. Mildly increased right ventricular  wall thickness.   5. Left atrial size was mildly dilated.   6. Right atrial size was mildly dilated.   7. The mitral valve is normal in structure. Trivial mitral valve  regurgitation.   8. The aortic valve is bicuspid. Aortic valve regurgitation is mild to  moderate. Aortic valve sclerosis is present, with no evidence of aortic  valve stenosis.    EKG: Sinus   Cardiac MRI: IMPRESSION: 1. Bicuspid aortic valve with fusion of right and noncoronary cusps. Severe aortic regurgitation (regurgitant fraction 59%)   2.  Severe LV dilatation with moderate systolic dysfunction (EF 31%)   3.  Severe RV dilatation with moderate systolic dysfunction (EF 30%)   4. RV insertion  site late gadolinium enhancement, which is a nonspecific finding often seen in setting of elevated pulmonary pressures   5. Dilated ascending aorta measuring 50mm   I have independently reviewed the above radiologic studies and discussed with the patient    Recent Lab Findings: Recent Labs       Lab Results  Component Value Date    WBC 4.9 12/03/2021    HGB 13.6 12/03/2021    HCT 40.0 12/03/2021    PLT 220 12/03/2021    GLUCOSE 102 (H) 12/05/2021    CHOL 167 09/16/2021    TRIG 71 09/16/2021    HDL 51 09/16/2021    LDLCALC 102 (H) 09/16/2021    ALT 93 (H) 09/15/2021    AST 77 (H) 09/15/2021    NA 139 12/05/2021    K 3.8 12/05/2021    CL 104 12/05/2021    CREATININE 1.36 (H) 12/05/2021    BUN 28 (H) 12/05/2021    CO2 27 12/05/2021    TSH 0.758 12/18/2017    HGBA1C 4.8 09/15/2021            Assessment / Plan:   51 yo male with bicuspid aortic valve, severe AI, and biventricular dysfunction.  We reviewed the echocardiogram and MRI.  I recommended that he undergo mechanical aortic valve replacement.  He is agreeable to proceed.  He does have some degree of right heart dysfunction on cardiac MRI.  We we will plan to use chemical support and have mechanical support available to be.  He will require dental clearance prior to surgery.  I  spent 55 minutes counseling the patient face to face.     Eliezer Lofts  Marlet Korte

## 2021-12-23 ENCOUNTER — Other Ambulatory Visit: Payer: Self-pay

## 2021-12-25 DIAGNOSIS — Z419 Encounter for procedure for purposes other than remedying health state, unspecified: Secondary | ICD-10-CM

## 2021-12-25 LAB — GLUCOSE, POCT (MANUAL RESULT ENTRY): POC Glucose: 199 mg/dl — AB (ref 70–99)

## 2021-12-25 NOTE — Congregational Nurse Program (Signed)
  Dept: [phone]   Congregational Nurse Program Note  Date of Encounter: 12/25/2021 Client to [facility] today with several different forms, one from his disability, one from [employer] and also paperwork from [facility] assistance. Assisted client with paper work. He requested blood sugar check, reports he is a diet controlled diabetic, but also takes [place]? Nonfasting blood sugar 199.  Client appreciative of assistance provided. He reports he has upcoming heart surgery and that he will be seeing a dentist prior to his surgery, but does not know who, or where. Client's wife does assist him with transportation to appointments. And keeps up with his appointments.   Past Medical History: Past Medical History:  Diagnosis Date   Aortic insufficiency due to bicuspid aortic valve 10/10/2021   Cardiac MRI 05/11/2021: Bicuspid aortic valve with fusion of the R & [place] cusps with severe AI (regurgitant fraction of 59%.   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  Severe AI 2/2 Bicusid AoV (R&[place] cusp fusion). Ascending Ao 42 mm   Valvular heart disease    a.) TTE on 12/19/2017 --> mild MV regurgitation; moderate AV regurgitation    Encounter Details:  CNP Questionnaire - 12/25/21 1041       Questionnaire   Do you give verbal consent to treat you today? Yes    Location Patient Served  [place]    Visit Setting Church  or Organization    Patient Status Unknown   client lives in a boarding house   Insurance Uninsured ([employer]/Self-Pay)   he reports he ahs applied for [employer] Referral N/A   client has applied for Medicaid per his report   Medication N/A    Medical Provider Yes   [facility]   Screening Referrals N/A    Medical Referral N/A    Medical Appointment Made N/A   assisted client with rescheduling his Cardiology apt that was today.   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 *Does need assistance depending on timing of appointments   Housing/Utilities N/A    Interpersonal Safety N/A    Intervention Support;Case Management;Navigate Healthcare System    ED Visit Averted N/A    Life-Saving Intervention Made N/A

## 2021-12-26 NOTE — Telephone Encounter (Signed)
I spoke with the patient. He is aware that I received a fax from [employer] that they need a proof of income in order to process his application for Entresto. Per the patient, he has been trying to gather this information and has finally received it.  He will be sending this to [employer] himself.   He was appreciative of the call today.

## 2021-12-31 ENCOUNTER — Encounter: Payer: Self-pay | Admitting: *Deleted

## 2021-12-31 ENCOUNTER — Other Ambulatory Visit (HOSPITAL_COMMUNITY): Payer: Self-pay

## 2021-12-31 ENCOUNTER — Other Ambulatory Visit: Payer: Self-pay

## 2021-12-31 ENCOUNTER — Telehealth (HOSPITAL_COMMUNITY): Payer: Self-pay

## 2021-12-31 ENCOUNTER — Other Ambulatory Visit: Payer: Self-pay | Admitting: Family

## 2021-12-31 MED ORDER — DAPAGLIFLOZIN PROPANEDIOL 10 MG PO TABS
10.0000 mg | ORAL_TABLET | Freq: Every day | ORAL | 3 refills | Status: DC
  Filled 2021-12-31: qty 30, 30d supply, fill #0

## 2021-12-31 MED ORDER — METOPROLOL SUCCINATE ER 25 MG PO TB24
25.0000 mg | ORAL_TABLET | Freq: Every day | ORAL | 3 refills | Status: DC
  Filled 2021-12-31: qty 30, 30d supply, fill #0

## 2021-12-31 MED ORDER — FUROSEMIDE 20 MG PO TABS
20.0000 mg | ORAL_TABLET | Freq: Every day | ORAL | 3 refills | Status: DC
  Filled 2021-12-31: qty 30, 30d supply, fill #0

## 2021-12-31 MED ORDER — SPIRONOLACTONE 25 MG PO TABS
25.0000 mg | ORAL_TABLET | Freq: Every day | ORAL | 3 refills | Status: DC
Start: 2021-12-31 — End: 2022-02-03
  Filled 2021-12-31: qty 90, 90d supply, fill #0

## 2021-12-31 NOTE — Telephone Encounter (Signed)
Received a message that patient needs refills on his HF medications. He has 3 days left on Entresto, Farxiga, and metoprolol. [employer] assistance for Sherryll Burger is still pending. He states he was just able to find no-income attestation letter to sign and will send in the mail tomorrow. As noted previously, he does not qualify for [place] assistance.  Will send HF meds (metoprolol, Farxiga, lasix, and spironolactone) to [facility] using HF fund. His mother states she will be able to pick these up tomorrow.   Will set aside samples of Entresto for her to pick up tomorrow as well. Message sent to clinic staff with this request.

## 2022-01-01 ENCOUNTER — Other Ambulatory Visit (HOSPITAL_COMMUNITY): Payer: Self-pay

## 2022-01-01 ENCOUNTER — Ambulatory Visit (HOSPITAL_COMMUNITY): Payer: Self-pay

## 2022-01-02 ENCOUNTER — Ambulatory Visit: Payer: Self-pay | Attending: Cardiology | Admitting: Cardiology

## 2022-01-02 NOTE — Progress Notes (Deleted)
Primary Care Provider: Pcp, No [facility] cardiologist: Bryan Lemma, MD Electrophysiologist: None  Clinic Note: No chief complaint on file.   ===================================  ASSESSMENT/PLAN   Problem List Items Addressed This Visit       Cardiology Problems   Essential hypertension (Chronic)   Aortic insufficiency due to bicuspid aortic valve - Primary (Chronic)   Ascending aorta dilatation (HCC) (Chronic)   Chronic combined systolic and diastolic heart failure (HCC) (Chronic)   Valvular cardiomyopathy (HCC) (Chronic)     Other   Polysubstance abuse (HCC) (Chronic)   Tobacco use (Chronic)    ===================================  HPI:    Darius [name] is a 51 y.o. male with a PMH notable for Bicuspid Aortic Valve, Severe AI, Valvular Cardiomyopathy, Chronic Systolic Heart Failure, Chronic Polysubstance Abuse (tobacco, alcohol, marijuana, cocaine) who presents today for ***.  I last saw Darius [name] via telehealth on November 29, 2021 to discuss his back berry valve-aortic insufficiency with reduced EF/valvular cardiomyopathy. => He was scheduled for right heart catheterization on August 25.  Plan will be to get CVTS consultation as well as Advanced Heart Failure Clinic consultation set up.  Recent Hospitalizations:  Admission 8/6-01/2022:  Reviewed  CV studies:    The following studies were reviewed today: (if available, images/films reviewed: From Epic Chart or Care Everywhere) Right/Left Heart Catheterization 12/03/2021 Left dominant normal coronary arteries.  LVEDP 41 mmHg.  Aortic pressure 114/62 mmHg. Mild pulmonary hypertension, mean 33 mmHg.  Mean capillary wedge pressure 26 mmHg.  WHO group 2 etiology.  Otherwise, normal right heart pressures.  Cardiac output 4.03 L/min with index 2.08.  Still volume overloaded  Interval History:   Darius [name]   CV Review of Symptoms (Summary) Cardiovascular ROS: {roscv:310661}  REVIEWED OF SYSTEMS    ROS  I have reviewed and (if needed) personally updated the patient's problem list, medications, allergies, past medical and surgical history, social and family history.   PAST MEDICAL HISTORY   Past Medical History:  Diagnosis Date   Aortic insufficiency due to bicuspid aortic valve 10/10/2021   Cardiac MRI 05/11/2021: Bicuspid aortic valve with fusion of the R & [place] cusps with severe AI (regurgitant fraction of 59%.   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  Severe AI 2/2 Bicusid AoV (R&[place] cusp fusion). Ascending Ao 42 mm   Valvular heart disease    a.) TTE on 12/19/2017 --> mild MV regurgitation; moderate AV regurgitation    PAST SURGICAL HISTORY   Past Surgical History:  Procedure Laterality Date   Cardiac MRI  11/08/2021   Bicuspid aortic valve with fusion of the right and noncoronary cusps-severe AI (regurgitant fraction 59%); Severe biventricular dilation with moderate dysfunction (LVEF estimated 39%, RVEF estimated 30%); ascending aorta measuring 42 mm.   CYSTOSCOPY/URETEROSCOPY/HOLMIUM LASER/STENT PLACEMENT Right 01/29/2021   Procedure: CYSTOSCOPY/URETEROSCOPY/HOLMIUM LASER/STENT PLACEMENT;  Surgeon: Riki Altes, MD;  Location: [facility] ORS;  Service: Urology;  Laterality: Right;   KIDNEY STONE SURGERY     05/2021   NO PAST SURGERIES     RIGHT/LEFT HEART CATH AND CORONARY ANGIOGRAPHY N/A 12/03/2021   Procedure: RIGHT/LEFT HEART  CATH AND CORONARY ANGIOGRAPHY;  Surgeon: Lyn Records, MD;  Location: [facility] INVASIVE CV LAB;  Service: Cardiovascular;  Laterality: N/A;   TTE 09/15/2021 showed a bicuspid aortic valve with mild-moderate AI, EF 40-45%, grade 3 diastolic dysfunction, mildly reduced RV systolic function.    There is no immunization history on file for this patient.  MEDICATIONS/ALLERGIES   No outpatient medications have been marked as taking for the 01/02/22 encounter (Appointment) with Marykay Lex, MD.    Allergies  Allergen Reactions   Avelox [Moxifloxacin] Other (See Comments)    Thoracic Aneurysm.   Ciprofloxacin Other (See Comments)    Has thoracic Aneurysm   Penicillins Other (See Comments)    Patient states that mother is highly allergic. 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  Has patient had a PCN reaction causing immediate rash, facial/tongue/throat swelling, SOB or lightheadedness with hypotension: No Has patient had a PCN reaction causing severe rash involving mucus membranes or skin necrosis: No Has patient had a PCN reaction that required hospitalization Yes Has patient had a PCN reaction occurring within the last 10 years: No If all of the above answers are "N    SOCIAL HISTORY/FAMILY HISTORY   Reviewed in Epic:  Pertinent findings:  Social History   Tobacco Use   Smoking status: Some Days    Packs/day: 0.25    Years: 35.00    Total pack years: 8.75    Types: Cigarettes    Last attempt to quit: 08/2020    Years since quitting: 1.3   Smokeless tobacco: Never   Tobacco comments:    Smokes 4-5 cigarettes per week.   Vaping Use   Vaping Use: Former   Quit date: 05/12/2021   Substances: Nicotine, Flavoring  Substance Use Topics   Alcohol use: Yes    Alcohol/week: 2.0 standard drinks of alcohol    Types: 2 Cans of beer per week    Comment: socially   Drug use: Yes    Types: Marijuana, Other-see comments, "Crack" cocaine    Comment: last use of crack  cocaine 01/2021 current use of marijuana   Social History   Social History Narrative      > From cardiology clinic note on October 10, 2021:      He is "doing the best he can "to cut out cigarettes and drugs.  He is down to about 5 cigarettes a week-not buying any, and and only smoking but he can borrow.  However he was with some friends a couple days ago and was smoking some marijuana that "unbeknownst to him "was laced with "powder ".  He had a little bit of heart fast heart rates and some discomfort in his chest after smoking "blunt".    OBJCTIVE -PE, EKG, labs   Wt Readings from Last 3 Encounters:  12/20/21 153 lb (69.4 kg)  12/13/21 153 lb 6.4 oz (69.6 kg)  12/05/21 145 lb 11.6 oz (66.1 kg)    Physical Exam: There were no vitals taken for this visit. Physical Exam   Adult ECG Report  Rate: *** ;  Rhythm: {rhythm:17366};   Narrative Interpretation: ***  Recent Labs:  ***  Lab Results  Component Value Date   CHOL 167 09/16/2021   HDL 51 09/16/2021   LDLCALC  102 (H) 09/16/2021   TRIG 71 09/16/2021   CHOLHDL 3.3 09/16/2021   Lab Results  Component Value Date   CREATININE 1.39 (H) 12/13/2021   BUN 17 12/13/2021   NA 141 12/13/2021   K 3.9 12/13/2021   CL 110 12/13/2021   CO2 25 12/13/2021      Latest Ref Rng & Units 12/03/2021    3:09 PM 12/03/2021    2:59 PM 12/03/2021    2:58 PM  CBC  Hemoglobin 13.0 - 17.0 g/dL 46.2  86.3  81.7   Hematocrit 39.0 - 52.0 % 40.0  40.0  40.0     Lab Results  Component Value Date   HGBA1C 4.8 09/15/2021   Lab Results  Component Value Date   TSH 0.758 12/18/2017    ================================================== I spent a total of ***minutes with the patient spent in direct patient consultation.  Additional time spent with chart review  / charting (studies, outside notes, etc): *** min Total Time: *** min  Current medicines are reviewed at length with the patient today.  (+/- concerns) ***  Notice: This dictation was  prepared with Dragon dictation along with smart phrase technology. Any transcriptional errors that result from this process are unintentional and may not be corrected upon review.  Studies Ordered:  No orders of the defined types were placed in this encounter.  No orders of the defined types were placed in this encounter.   Patient Instructions / Medication Changes & Studies & Tests Ordered   There are no Patient Instructions on file for this visit.       Marykay Lex, MD, MS Bryan Lemma, M.D., M.S. Interventional Cardiologist  [facility]   [address] [place], [place]  [address] [phone]           Fax [phone]    Thank you for choosing [facility] in [place]!!

## 2022-01-03 ENCOUNTER — Encounter: Payer: Self-pay | Admitting: Cardiology

## 2022-01-03 ENCOUNTER — Other Ambulatory Visit (HOSPITAL_COMMUNITY): Payer: Self-pay

## 2022-01-03 NOTE — Telephone Encounter (Signed)
Received notification that he has called CT surgery office for Entresto. Attempted to call him and his wife to follow up if they are able to get the samples of Entresto that were previously arranged for him last week.   LVM to call back.

## 2022-01-13 ENCOUNTER — Ambulatory Visit: Payer: Self-pay | Admitting: Family

## 2022-01-13 NOTE — Telephone Encounter (Signed)
Medication Samples have been provided to the patient.  Drug name: Delene Loll        Strength: 24/26        Qty: 28  LOT: [id number]  Exp.Date: 8/25  Dosing instructions: take 1 tablet by mouth twice a day.  The patient has been instructed regarding the correct time, dose, and frequency of taking this medication, including desired effects and most common side effects.   Samples has been placed at the front desk for patient to pick up.  Tiney Rouge Aleksi Brummet 10:35 AM 01/13/2022

## 2022-01-14 ENCOUNTER — Other Ambulatory Visit (HOSPITAL_COMMUNITY): Payer: Self-pay

## 2022-01-14 ENCOUNTER — Other Ambulatory Visit (HOSPITAL_COMMUNITY): Payer: Self-pay | Admitting: Internal Medicine

## 2022-01-14 ENCOUNTER — Other Ambulatory Visit: Payer: Self-pay | Admitting: Family

## 2022-01-15 ENCOUNTER — Other Ambulatory Visit (HOSPITAL_COMMUNITY): Payer: Self-pay

## 2022-01-15 MED FILL — Sacubitril-Valsartan Tab 24-26 MG: ORAL | 30 days supply | Qty: 60 | Fill #0 | Status: CN

## 2022-01-20 ENCOUNTER — Other Ambulatory Visit: Payer: Self-pay | Admitting: *Deleted

## 2022-01-21 ENCOUNTER — Other Ambulatory Visit (HOSPITAL_COMMUNITY): Payer: Self-pay

## 2022-01-21 ENCOUNTER — Telehealth (HOSPITAL_COMMUNITY): Payer: Self-pay

## 2022-01-21 ENCOUNTER — Ambulatory Visit (HOSPITAL_COMMUNITY): Payer: Self-pay

## 2022-01-23 ENCOUNTER — Encounter (HOSPITAL_COMMUNITY): Admission: RE | Payer: Self-pay | Source: Home / Self Care

## 2022-01-23 ENCOUNTER — Inpatient Hospital Stay (HOSPITAL_COMMUNITY)
Admission: RE | Admit: 2022-01-23 | Payer: Self-pay | Source: Home / Self Care | Admitting: Thoracic Surgery (Cardiothoracic Vascular Surgery)

## 2022-01-23 SURGERY — REPLACEMENT, AORTIC VALVE, OPEN
Anesthesia: General | Site: Chest

## 2022-01-24 ENCOUNTER — Inpatient Hospital Stay (HOSPITAL_COMMUNITY): Admission: RE | Admit: 2022-01-24 | Payer: Self-pay | Source: Ambulatory Visit

## 2022-01-29 ENCOUNTER — Telehealth (HOSPITAL_COMMUNITY): Payer: Self-pay

## 2022-01-29 NOTE — Telephone Encounter (Signed)
Advanced Heart Failure Patient Advocate Encounter  Review of patient chart for medication assistance.  Entresto [employer]) has been submitted for assistance. Previous notes indicate patient was providing income information directly to [employer].  Wilder Glade was previously filled using the [place]. This is no longer billed to the [place]. Recommend changing to Jardiance [facility]). Patient will need to start a new application for [facility].  Left message on patient voicemail.  Clista Bernhardt, CPhT Rx Patient Advocate Phone: [phone]

## 2022-02-03 ENCOUNTER — Other Ambulatory Visit: Payer: Self-pay | Admitting: Family

## 2022-02-03 ENCOUNTER — Other Ambulatory Visit: Payer: Self-pay

## 2022-02-03 ENCOUNTER — Other Ambulatory Visit (HOSPITAL_COMMUNITY): Payer: Self-pay

## 2022-02-03 ENCOUNTER — Telehealth (HOSPITAL_COMMUNITY): Payer: Self-pay

## 2022-02-03 MED ORDER — ENTRESTO 24-26 MG PO TABS
1.0000 | ORAL_TABLET | Freq: Two times a day (BID) | ORAL | 0 refills | Status: DC
  Filled 2022-02-03: qty 60, 30d supply, fill #0

## 2022-02-03 MED ORDER — SPIRONOLACTONE 25 MG PO TABS
25.0000 mg | ORAL_TABLET | Freq: Every day | ORAL | 0 refills | Status: DC
  Filled 2022-02-03: qty 30, 30d supply, fill #0

## 2022-02-03 MED ORDER — FUROSEMIDE 20 MG PO TABS
20.0000 mg | ORAL_TABLET | Freq: Every day | ORAL | 0 refills | Status: DC
  Filled 2022-02-03: qty 30, 30d supply, fill #0

## 2022-02-03 MED ORDER — METOPROLOL SUCCINATE ER 25 MG PO TB24
25.0000 mg | ORAL_TABLET | Freq: Every day | ORAL | 0 refills | Status: DC
  Filled 2022-02-03: qty 30, 30d supply, fill #0

## 2022-02-03 MED ORDER — DAPAGLIFLOZIN PROPANEDIOL 10 MG PO TABS
10.0000 mg | ORAL_TABLET | Freq: Every day | ORAL | 0 refills | Status: DC
  Filled 2022-02-03: qty 30, 30d supply, fill #0

## 2022-02-03 NOTE — Progress Notes (Signed)
Meds refilled today but no refills provided. Must keep appt on 02/11/22 for further prescription refills.

## 2022-02-03 NOTE — Telephone Encounter (Signed)
Requested refills to be sent for HF meds to [facility] pharmacy to be used under HF fund.

## 2022-02-04 ENCOUNTER — Other Ambulatory Visit (HOSPITAL_COMMUNITY): Payer: Self-pay | Admitting: Dentistry

## 2022-02-04 ENCOUNTER — Other Ambulatory Visit: Payer: Self-pay

## 2022-02-04 ENCOUNTER — Other Ambulatory Visit (HOSPITAL_COMMUNITY): Payer: Self-pay

## 2022-02-06 ENCOUNTER — Other Ambulatory Visit (HOSPITAL_COMMUNITY): Payer: Self-pay

## 2022-02-06 ENCOUNTER — Telehealth (HOSPITAL_COMMUNITY): Payer: Self-pay | Admitting: Pharmacy Technician

## 2022-02-06 ENCOUNTER — Other Ambulatory Visit (HOSPITAL_COMMUNITY): Payer: Self-pay | Admitting: *Deleted

## 2022-02-06 NOTE — Telephone Encounter (Signed)
Advanced Heart Failure Patient Advocate Encounter  Sent docusign link for Jardiance assistance.

## 2022-02-06 NOTE — Telephone Encounter (Signed)
Advanced Heart Failure Patient Advocate Encounter  Patient called in stating that Entresto was $400. Patient is currently uninsured. We have applied for [employer] patient assistance on his behalf. The patient needs to provide POI in order to be approved. Called and spoke with patient. He states that the attestation letter was sent in about 2 weeks ago. [employer]. They are stating that the income attestation was not received. The representative was not willing to send a copy to the office. Advised that if the patient needed another attestation letter, he would need to initiate the call.  Called and updated patient. He is going to call back after his wife gets home and she will email the attestation letter to me. I will send to [employer] after received. Sent Philicia (CMA) a request for samples.

## 2022-02-07 ENCOUNTER — Encounter (HOSPITAL_COMMUNITY): Payer: Self-pay

## 2022-02-07 NOTE — Progress Notes (Unsigned)
Medication Samples have been provided to the patient.  Drug name: Delene Loll       Strength: 24/26mg         Qty: 2  LOT: [id number]  Exp.Date: 8/25  Dosing instructions: 1 tablet by mouth twice daily.  The patient has been instructed regarding the correct time, dose, and frequency of taking this medication, including desired effects and most common side effects.   Asencion Noble 8:18 AM 02/07/2022

## 2022-02-10 ENCOUNTER — Other Ambulatory Visit (HOSPITAL_COMMUNITY): Payer: Self-pay | Admitting: Dentistry

## 2022-02-10 ENCOUNTER — Encounter (HOSPITAL_COMMUNITY): Payer: Self-pay

## 2022-02-11 ENCOUNTER — Ambulatory Visit: Payer: Medicaid Other | Admitting: Family

## 2022-02-11 ENCOUNTER — Telehealth: Payer: Self-pay

## 2022-02-11 ENCOUNTER — Encounter (HOSPITAL_COMMUNITY): Payer: Self-pay | Admitting: Cardiology

## 2022-02-11 NOTE — Telephone Encounter (Signed)
Patient contacted the office stating that he is currently out of Entresto. He states that he has taken his last pill today. Contacted patient back and left voicemail message to contact HF clinic for further refills. Patient was to follow-up with HF clinic today at 2p. Unsure if he made it to that appt. Advised via VM to contact the office back if he has any questions or concerns.

## 2022-02-12 ENCOUNTER — Telehealth (HOSPITAL_COMMUNITY): Payer: Self-pay | Admitting: Pharmacist

## 2022-02-12 NOTE — Telephone Encounter (Signed)
Received message from patient that he is out of Entresto. Patient no showed for his clinic visit with Dr. Aundra Dubin yesterday. Additionally, Entresto samples were already left at the front desk for the patient to pick up on 02/07/22. Samples are still at the desk the patient has not picked them up.   Left patient a VM and instructed him samples are still at the front desk for him. He will need to make a repeat appointment with our office. If he continues to no show, we will not be able to provide him any more samples or help with medications. He has not been seen by our office since 12/13/21 and has no showed multiple times.    Audry Riles, PharmD, BCPS, BCCP, CPP Heart Failure Clinic Pharmacist [phone]

## 2022-02-13 ENCOUNTER — Other Ambulatory Visit (HOSPITAL_COMMUNITY): Payer: Self-pay

## 2022-02-13 ENCOUNTER — Telehealth: Payer: Self-pay

## 2022-02-13 NOTE — Telephone Encounter (Signed)
Patient contacted the office back again today about needing Entresto refilled. Read note from HF clinic stating that samples are at the front desk and he does need to f/u with them to continue to take the medication. He is going to see if his wife can pick up the samples. He states that he does not check his voicemail on his phone because of all the bill collectors. Also advised that he needed to make another appointment with Dr. Benson Norway for dental evaluation. He acknowledged receipt and states he will call and make an appt today.

## 2022-02-17 NOTE — Telephone Encounter (Signed)
Advanced Heart Failure Patient Advocate Encounter  Called for update on application.  [employer] Microbiologist Letter has been sent to patient but not returned to [employer].  Patient seems mixed up between [place] application on [employer], and Entresto application which needs POI completed. He will check with his wife when able, but does think attestation has been recently posted (unsure if to [employer] or [facility]).  If no update is received from [employer], will try to contact patient or wife later this week to verify attestation letter has been signed and returned.  Clista Bernhardt, CPhT Rx Patient Advocate Phone: [phone]

## 2022-02-18 NOTE — Telephone Encounter (Signed)
Advanced Heart Failure Patient Advocate Encounter  Application for Jardiance faxed to [employer] on 02/18/2022. Application form attached to patient chart.  Stephaine H, CPhT Rx Patient Advocate Phone: [phone] 

## 2022-02-20 NOTE — Telephone Encounter (Signed)
Advanced Heart Failure Patient Advocate Encounter  [employer] requested clarification and POI. Faxed on 02/20/2022.  Clista Bernhardt, CPhT Rx Patient Advocate Phone: [phone]

## 2022-02-25 ENCOUNTER — Other Ambulatory Visit (HOSPITAL_COMMUNITY): Payer: Self-pay | Admitting: *Deleted

## 2022-02-25 MED ORDER — EMPAGLIFLOZIN 10 MG PO TABS: 10.0000 mg | ORAL_TABLET | Freq: Every day | ORAL | 3 refills | Status: DC

## 2022-02-25 NOTE — Telephone Encounter (Signed)
Advanced Heart Failure Patient Advocate Encounter   Patient was approved to receive Jardiance from [facility]. Effective: 02/24/2022 to 02/24/2023. Determination letter has been added to patient chart.  Spoke to patients wife on the phone, provided contact information for Elisah Parmer Schein. Messaged healthcare team to update med list.  Clista Bernhardt, CPhT Rx Patient Advocate Phone: [phone]

## 2022-02-25 NOTE — Telephone Encounter (Signed)
Advanced Heart Failure Patient Advocate Encounter  Spoke to patients wife about POI needed for Entresto. Provided contact information for [employer]. He will need to sign attestation letter, and she may need to provide records of her income.  Will follow up with wife on or after 03/03/22 for update if nothing is received at [facility].  Clista Bernhardt, CPhT Rx Patient Advocate Phone: [phone]

## 2022-03-03 ENCOUNTER — Other Ambulatory Visit: Payer: Self-pay

## 2022-03-03 ENCOUNTER — Telehealth (HOSPITAL_COMMUNITY): Payer: Self-pay

## 2022-03-03 ENCOUNTER — Other Ambulatory Visit (HOSPITAL_COMMUNITY): Payer: Self-pay | Admitting: *Deleted

## 2022-03-03 ENCOUNTER — Other Ambulatory Visit (HOSPITAL_COMMUNITY): Payer: Self-pay

## 2022-03-03 MED ORDER — EMPAGLIFLOZIN 10 MG PO TABS
10.0000 mg | ORAL_TABLET | Freq: Every day | ORAL | 11 refills | Status: DC
  Filled 2022-03-03 – 2022-03-18 (×2): qty 30, 30d supply, fill #0
  Filled 2022-04-16 (×2): qty 30, 30d supply, fill #1
  Filled 2022-05-15: qty 30, 30d supply, fill #2
  Filled 2022-06-19 – 2022-06-20 (×2): qty 30, 30d supply, fill #3
  Filled 2022-07-11 – 2022-07-25 (×4): qty 30, 30d supply, fill #4
  Filled 2022-08-22: qty 30, 30d supply, fill #5
  Filled 2022-09-19 (×2): qty 30, 30d supply, fill #6

## 2022-03-03 MED ORDER — ENTRESTO 24-26 MG PO TABS
1.0000 | ORAL_TABLET | Freq: Two times a day (BID) | ORAL | 11 refills | Status: DC
  Filled 2022-03-03 – 2022-03-18 (×2): qty 60, 30d supply, fill #0
  Filled 2022-04-16 (×2): qty 60, 30d supply, fill #1
  Filled 2022-05-15: qty 60, 30d supply, fill #2
  Filled 2022-06-19: qty 60, 30d supply, fill #3
  Filled 2022-08-22: qty 60, 30d supply, fill #4
  Filled 2022-09-19 (×2): qty 60, 30d supply, fill #5

## 2022-03-03 NOTE — Telephone Encounter (Signed)
Advanced Heart Failure Patient Advocate Encounter  New insurance is active on patient chart. Added to [facility] for benefit investigation.  Prior authorization is required for Entresto 24-26 MG. PA submitted and APPROVED on 03/03/2022. Key [id number] Effective: 03/03/2022 - 03/04/2023  Determination letter has been added to patient chart. Test billing returns $4 for 90 day supply. Spoke to patients wife on the phone, new rxs being sent to [facility].   Clista Bernhardt, CPhT Rx Patient Advocate Phone: [phone]

## 2022-03-03 NOTE — Telephone Encounter (Addendum)
Advanced Heart Failure Patient Advocate Encounter  Prior authorization is required for Jardiance 10MG . PA submitted and APPROVED on 03/03/2022. Key [id number] Effective: 03/03/2022 - 03/04/2023  Determination letter has been added to patient chart. Test billing returns $4 copay for 90 days. Spoke to patients wife on the phone to confirm. New rxs sent to [facility].  Clista Bernhardt, CPhT Rx Patient Advocate Phone: [phone]

## 2022-03-05 NOTE — Telephone Encounter (Signed)
Error

## 2022-03-18 ENCOUNTER — Other Ambulatory Visit (HOSPITAL_COMMUNITY): Payer: Self-pay

## 2022-03-18 ENCOUNTER — Other Ambulatory Visit: Payer: Self-pay

## 2022-03-18 ENCOUNTER — Other Ambulatory Visit (HOSPITAL_COMMUNITY): Payer: Self-pay | Admitting: Internal Medicine

## 2022-03-19 ENCOUNTER — Other Ambulatory Visit (HOSPITAL_COMMUNITY): Payer: Self-pay

## 2022-03-19 MED ORDER — METOPROLOL SUCCINATE ER 25 MG PO TB24
25.0000 mg | ORAL_TABLET | Freq: Every day | ORAL | 0 refills | Status: DC
  Filled 2022-03-19: qty 30, 30d supply, fill #0

## 2022-03-19 MED ORDER — SPIRONOLACTONE 25 MG PO TABS
25.0000 mg | ORAL_TABLET | Freq: Every day | ORAL | 0 refills | Status: DC
  Filled 2022-03-19: qty 30, 30d supply, fill #0

## 2022-03-19 MED ORDER — FUROSEMIDE 20 MG PO TABS
20.0000 mg | ORAL_TABLET | Freq: Every day | ORAL | 0 refills | Status: DC
  Filled 2022-03-19: qty 30, 30d supply, fill #0

## 2022-04-07 ENCOUNTER — Other Ambulatory Visit (HOSPITAL_COMMUNITY): Payer: Medicaid Other | Admitting: Dentistry

## 2022-04-15 ENCOUNTER — Inpatient Hospital Stay: Payer: Medicaid Other | Attending: Internal Medicine

## 2022-04-15 ENCOUNTER — Ambulatory Visit (HOSPITAL_COMMUNITY): Payer: Medicaid Other | Admitting: Dentistry

## 2022-04-15 ENCOUNTER — Encounter (HOSPITAL_COMMUNITY): Payer: Self-pay | Admitting: Dentistry

## 2022-04-15 NOTE — Patient Instructions (Signed)
[facility] Department of Dental Medicine Dr. Johnay Mano B. Azzam Mehra, DMD Phone: [phone] Fax: [phone]     It was a pleasure seeing you today!  Please refer to the information below regarding your dental visit with us.  Do not hesitate to give us a call if any questions or concerns come up after you leave.   Thank you for letting us provide care for you.  If there is anything we can do for you, please let us know.    HEART VALVES AND MOUTH CARE     FACTS: If you have any infection in your mouth, it can infect your heart valve. If you heart valve is infected, you will be seriously ill. Infections in the mouth can be SILENT and do not always cause pain. Examples of infections in the mouth are gum disease, dental cavities and abscesses. Some possible signs of infection are listed below.  There are many other signs as well. Bad breath Bleeding gums Teeth that are sensitive to sweets, hot, and/or cold      WHAT YOU HAVE TO DO: Brush your teeth after meals and at bedtime.  Spend at least 2 minutes brushing well, especially behind your back teeth and all around your teeth that stand alone.  Brush at the gumline also. Do not go to bed without brushing your teeth AND flossing.  If your gums bleed when you brush or floss, do NOT stop brushing or flossing.  Bleeding can be a sign of inflammation or irritation from bacteria.  It usually means that your gums need more attention and better cleaning.  If your dentist or Dr. Cambreigh Dearing gave you a prescription mouthwash to use, make sure to use it as directed.  If you run out of the prescription, notify your pharmacy. If you were given any other medications or directions by your dentist, please follow them.  If you did not understand the directions or forget what you were told, please call.  We will be happy to refresh your memory. If you need antibiotics before dental procedures, make sure you take them one hour prior  to every dental visit as directed.  Get a dental check-up every 4-6 months in order to keep your mouth healthy, or to find and treat any new infection. You will most likely need your teeth cleaned or gums treated at the same time. If you are not able to come in for your scheduled appointment, call your dentist as soon as possible to reschedule. If you have a problem in between dental visits, call your dentist.   WE ARE A TEAM.  OUR GOAL IS: HEALTHY MOUTH, HEALTHY HEART.   QUESTIONS?  Call our office during office hours at [phone].   

## 2022-04-15 NOTE — Progress Notes (Unsigned)
[facility]     OUTPATIENT CONSULTATION   PLAN/RECOMMENDATIONS     ASSESSMENT: There are no current signs of acute odontogenic infection including abscess, edema or erythema, or suspicious lesion requiring biopsy.   Caries, *** ; periodontal concerns that include ***     RECOMMENDATIONS: Extractions of all indicated teeth to decrease the risk of perioperative and postoperative systemic infection and complications *** .   No dental intervention indicated prior to *** at this time.  Establish dental care at an outside office of the patient's choice for routine care including cleanings/periodontal therapy and periodic exams. ***     PLAN: Discuss case with medical team and coordinate treatment as needed.  *** pending medical team's recommendations.  Discussed in detail all treatment options and recommendations with the patient and they are agreeable to the plan.   Thank you for consulting with [facility] and for the opportunity to participate in this patient's treatment.  Should you have any questions or concerns, please contact the [facility] at [phone].    Service Date:   04/15/2022  Patient Name:   Darius [name] Date of Birth:   [birth year] Medical Record Number: [id number]  Referring Provider:         Melodie Bouillon, MD   HISTORY OF PRESENT ILLNESS: Darius [name] is a very pleasant 51 y.o. male with history of COPD, HTN, valvular cardiomyopathy, congestive heart failure, tobacco use (cigarettes- current smoker), type 2 diabetes mellitus, seizures, glaucoma who was recently diagnosed with severe aortic valve regurgitation and biventricular failure and is anticipating heart valve surgery.   The patient presents today for a medically necessary dental consultation as part of their pre-cardiac surgery work-up.   DENTAL HISTORY: The patient reports his last dental visit was several years ago for a cleaning. He  does not have a dentist that he sees regularly at this time.  He/she *** currently denies any dental/orofacial pain or sensitivity. Patient is able to manage oral secretions.  Patient denies dysphagia, odynophagia, dysphonia, SOB and neck pain.  Patient denies fever, rigors and malaise.   CHIEF COMPLAINT:  Here for a preoperative dental evaluation.   Patient Active Problem List   Diagnosis Date Noted   Acute on chronic congestive heart failure (HCC)    Aortic regurgitation due to bicuspid aortic valve 12/02/2021   Valvular cardiomyopathy ([place]) 10/10/2021   Aortic insufficiency due to bicuspid aortic valve 10/10/2021   Malnutrition of moderate degree 09/17/2021   Chronic combined systolic and diastolic heart failure ([place]) 09/15/2021   COPD (chronic obstructive pulmonary disease) ([place]) 09/15/2021   Abnormal LFTs 09/15/2021   Polysubstance abuse ([place]) 09/15/2021   Acid reflux 08/27/2021   Nasal congestion 08/27/2021   History of panic attacks 05/29/2021   Ascending aorta dilatation ([place]) 05/29/2021   Shortness of breath 08/15/2020   Abdominal pain 07/11/2020   Essential hypertension 12/30/2017   Tobacco use 12/30/2017   Substance use disorder 12/30/2017   (HFpEF) heart failure with preserved ejection fraction ([place]) 12/18/2017   Past Medical History:  Diagnosis Date   Aortic insufficiency due to bicuspid aortic valve 10/10/2021   Cardiac MRI 05/11/2021: Bicuspid aortic valve with fusion of the R & [place] cusps with severe AI (regurgitant fraction of 59%.   Chronic HFpEF    a.) TTE on 12/19/2017 --> LVEF 60-65%, no RWMAs, mild LA dilitation, PASP 44 mmHg   COPD (chronic obstructive pulmonary disease) (HCC)    Glaucoma    History of  2019 novel coronavirus disease (COVID-19) 09/15/2020   Hypertension    Nephrolithiasis    Polysubstance abuse (HCC)    ETOH, marijuana, cocaine   Seizures (HCC)    T2DM (type 2 diabetes mellitus) (HCC)    a.)  Controlled with diet lifestyle modifications    Valvular cardiomyopathy (HCC) 12/19/2017   Cardiac MRI 11/08/2021: Severe biventricular dilation with moderate to severe dysfunction: LVEF ~31, RVEF ~30%.  Severe AI 2/2 Bicusid AoV (R&[place] cusp fusion). Ascending Ao 42 mm   Valvular heart disease    a.) TTE on 12/19/2017 --> mild MV regurgitation; moderate AV regurgitation   Past Surgical History:  Procedure Laterality Date   Cardiac MRI  11/08/2021   Bicuspid aortic valve with fusion of the right and noncoronary cusps-severe AI (regurgitant fraction 59%); Severe biventricular dilation with moderate dysfunction (LVEF estimated 39%, RVEF estimated 30%); ascending aorta measuring 42 mm.   CYSTOSCOPY/URETEROSCOPY/HOLMIUM LASER/STENT PLACEMENT Right 01/29/2021   Procedure: CYSTOSCOPY/URETEROSCOPY/HOLMIUM LASER/STENT PLACEMENT;  Surgeon: Riki Altes, MD;  Location: [facility] ORS;  Service: Urology;  Laterality: Right;   KIDNEY STONE SURGERY     05/2021   NO PAST SURGERIES     RIGHT/LEFT HEART CATH AND CORONARY ANGIOGRAPHY N/A 12/03/2021   Procedure: RIGHT/LEFT HEART CATH AND CORONARY ANGIOGRAPHY;  Surgeon: Lyn Records, MD;  Location: [facility];  Service: Cardiovascular;  Laterality: N/A;   Allergies  Allergen Reactions   Avelox [Moxifloxacin] Other (See Comments)    Thoracic Aneurysm.   Ciprofloxacin Other (See Comments)    Has thoracic Aneurysm   Penicillins Other (See Comments)    Patient states that mother is highly allergic. 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  Has patient had a PCN reaction causing immediate rash, facial/tongue/throat swelling, SOB or lightheadedness with hypotension: No Has patient had a PCN reaction causing severe rash involving mucus membranes or skin necrosis: No Has patient had a PCN reaction that required hospitalization Yes Has patient had a PCN reaction occurring within the last 10 years: No If all of the above answers are "N   Current Outpatient Medications  Medication Sig  Dispense Refill   albuterol (PROVENTIL HFA) 108 (90 Base) MCG/ACT inhaler Inhale 2 puffs into the lungs once every 6 (six) hours as needed for wheezing or shortness of breath. 18 g 3   empagliflozin (JARDIANCE) 10 MG TABS tablet Take 1 tablet (10 mg total) by mouth daily before breakfast. 30 tablet 11   feeding supplement (ENSURE ENLIVE / ENSURE PLUS) LIQD Take 237 mLs by mouth 2 (two) times daily between meals. (Patient taking differently: Take 237 mLs by mouth daily.) 237 mL 12   furosemide (LASIX) 20 MG tablet Take 1 tablet (20 mg total) by mouth daily. 30 tablet 0   metoprolol succinate (TOPROL-XL) 25 MG 24 hr tablet Take 1 tablet (25 mg total) by mouth daily. 30 tablet 0   sacubitril-valsartan (ENTRESTO) 24-26 MG Take 1 tablet by mouth 2 (two) times daily. 60 tablet 11   spironolactone (ALDACTONE) 25 MG tablet Take 1 tablet (25 mg total) by mouth daily. 30 tablet 0   No current facility-administered medications for this visit.    LABS:  Lab Results  Component Value Date   WBC 4.9 12/03/2021   HGB 13.6 12/03/2021   HCT 40.0 12/03/2021   MCV 92.7 12/03/2021   PLT 220 12/03/2021      Component Value Date/Time   NA 141 12/13/2021 1132   NA 140 07/18/2020 1128  K 3.9 12/13/2021 1132   CL 110 12/13/2021 1132   CO2 25 12/13/2021 1132   GLUCOSE 130 (H) 12/13/2021 1132   BUN 17 12/13/2021 1132   BUN 14 07/18/2020 1128   CREATININE 1.39 (H) 12/13/2021 1132   CALCIUM 9.3 12/13/2021 1132   GFRNONAA >60 12/13/2021 1132   GFRAA >60 11/23/2019 1123   No results found for: "INR", "PROTIME" No results found for: "PTT"  Social History   Socioeconomic History   Marital status: Legally Separated    Spouse name: Ivin Booty   Number of children: 2   Years of education: Not on file   Highest education level: GED or equivalent  Occupational History   Occupation: disability  Tobacco Use   Smoking status: Some Days    Packs/day: 0.25    Years: 35.00    Total pack years: 8.75    Types:  Cigarettes    Last attempt to quit: 08/2020    Years since quitting: 1.6   Smokeless tobacco: Never   Tobacco comments:    Smokes 4-5 cigarettes per week.   Vaping Use   Vaping Use: Former   Quit date: 05/12/2021   Substances: Nicotine, Flavoring  Substance and Sexual Activity   Alcohol use: Yes    Alcohol/week: 2.0 standard drinks of alcohol    Types: 2 Cans of beer per week    Comment: socially   Drug use: Yes    Types: Marijuana, Other-see comments, "Crack" cocaine    Comment: last use of crack cocaine 01/2021 current use of marijuana   Sexual activity: Yes  Other Topics Concern   Not on file  Social History Narrative      > From cardiology clinic note on October 10, 2021:      He is "doing the best he can "to cut out cigarettes and drugs.  He is down to about 5 cigarettes a week-not buying any, and and only smoking but he can borrow.  However he was with some friends a couple days ago and was smoking some marijuana that "unbeknownst to him "was laced with "powder ".  He had a little bit of heart fast heart rates and some discomfort in his chest after smoking "blunt".   Social Determinants of Health   Financial Resource Strain: High Risk (12/13/2021)   Overall Financial Resource Strain (CARDIA)    Difficulty of Paying Living Expenses: Very hard  Food Insecurity: No Food Insecurity (10/31/2021)   Hunger Vital Sign    Worried About Running Out of Food in the Last Year: Never true    Ran Out of Food in the Last Year: Never true  Transportation Needs: Unmet Transportation Needs (12/04/2021)   PRAPARE - Hydrologist (Medical): Yes    Lack of Transportation (Non-Medical): Yes  Physical Activity: Unknown (03/01/2018)   Exercise Vital Sign    Days of Exercise per Week: 7 days    Minutes of Exercise per Session: Patient refused  Stress: Stress Concern Present (03/01/2018)   [place]    Feeling  of Stress : Very much  Social Connections: Moderately Isolated (03/01/2018)   Social Connection and Isolation Panel [NHANES]    Frequency of Communication with Friends and Family: Never    Frequency of Social Gatherings with Friends and Family: Never    Attends Religious Services: Never    Marine scientist or Organizations: No    Attends Archivist Meetings: Never  Marital Status: Married  Catering manager Violence: Not At Risk (03/01/2018)   Humiliation, Afraid, Rape, and Kick questionnaire    Fear of Current or Ex-Partner: No    Emotionally Abused: No    Physically Abused: No    Sexually Abused: No   Family History  Problem Relation Age of Onset   Hypertension Mother    Kidney disease Mother    Diabetes Mother    Mitral valve prolapse Mother    Diabetes Father    Hypertension Maternal Grandmother    Mitral valve prolapse Maternal Grandmother    Other Half-Brother        accident     REVIEW OF SYSTEMS:  Reviewed with the patient as per HPI. PSYCH:  [+] Dental phobia  Patient denies having dental phobia. ***   VITAL SIGNS:  BP (!) 109/39 (BP Location: Right Arm, Patient Position: Sitting, Cuff Size: Normal)   Pulse (!) 55   Temp 98 F (36.7 C) (Oral)    PHYSICAL EXAM: GENERAL:  Well-developed, comfortable and in no apparent distress. NEUROLOGICAL:  Alert and oriented to person, place and  time. EXTRAORAL:  Facial symmetry present without any edema or erythema.  No swelling or lymphadenopathy.  TMJ asymptomatic without clicks or crepitations.  [+] Facial symmetry:  *** INTRAORAL:  Soft tissues appear well-perfused and mucous membranes moist.  FOM and vestibules soft and not raised. Oral cavity without mass or lesion. No signs of infection, parulis, sinus tract, edema or erythema evident upon exam.  Tori, exostosis *** [+] Soft tissues:  ***   DENTAL EXAM: Hard tissue exam completed and charted.    OVERALL IMPRESSION:  Poor *** remaining dentition.        ORAL HYGIENE:  Poor ***  ***INSERT TOOTH CHART***  PERIODONTAL:  Pink, healthy gingival tissue with blunted papilla ***.   *** plaque and calculus accumulation. [+] Mobility:  *** [+] Recession:  *** CARIES:  *** RETAINED ROOT TIPS:    *** DEFECTIVE RESTORATIONS:    *** ENDODONTICS:   ***    Vitality testing/other testing:  Tested teeth numbers *** for *** PROSTHODONTICS:  Patient denies wearing partial dentures.  *** OCCLUSION:  Unable to assess molar occlusion.  *** Non-functional teeth:  *** OTHER FINDINGS:   [+] Attrition/wear:  *** [+] Abfraction(s):  *** [+] Impacted teeth:  ***   RADIOGRAPHIC EXAM:  Orthopantogram taken on 12/05/21 interpreted.  Full Mouth Series was exposed and interpreted.   Condyles seated bilaterally in fossas.  No evidence of abnormal pathology.  All visualized osseous structures appear WNL.   Missing teeth ***   Localized mild *** horizontal bone loss consistent with *** .   Existing restorations ***   ASSESSMENT:  1.  Severe aortic valve regurgitation 2.  Preoperative dental exam 3.  *** 4.  *** 5.  *** 6.  *** 7.  *** 8.  *** 9.  *** 10.  *** Missing teeth [place] Postoperative bleeding risk ***   PLAN AND RECOMMENDATIONS: I discussed the risks, benefits, and complications of various scenarios with the patient in relationship to their medical and dental conditions, which included systemic infection such as endocarditis, bacteremia or other serious issues that could potentially occur either before, during or after their anticipated heart surgery if dental/oral concerns are not addressed.  I explained that if any chronic or acute dental/oral infection(s) are addressed and subsequently not maintained following medical optimization and recovery, their risk of the previously mentioned complications are just as high and could potentially  occur postoperatively.  I explained all significant findings of the dental consultation with the  patient including *** and the recommended care including *** in order to optimize them for heart surgery from a dental standpoint.  The patient verbalized understanding of all findings, discussion, and recommendations. We then discussed various treatment options to include no treatment, multiple extractions with alveoloplasty, pre-prosthetic surgery as indicated, periodontal therapy, dental restorations, root canal therapy, crown and bridge therapy, implant therapy, and replacement of missing teeth as indicated.  The patient verbalized understanding of all options, and currently wishes to proceed with *** .  Plan to discuss all findings and recommendations with medical team and coordinate future care as needed.    The patient will need to establish care at a dental office of his choice for routine dental care including replacement of missing teeth as needed, cleanings and exams. Call if any questions or concerns arise.  All questions and concerns were invited and addressed.  The patient tolerated today's visit well and departed in stable condition.    I spent in excess of 120 minutes during the conduct of this consultation and >50% of this time involved direct face-to-face encounter for counseling and/or coordination of the patient's care.  Sandi Mariscal, DMD

## 2022-04-16 ENCOUNTER — Other Ambulatory Visit (HOSPITAL_COMMUNITY): Payer: Self-pay

## 2022-04-16 ENCOUNTER — Other Ambulatory Visit (HOSPITAL_COMMUNITY): Payer: Self-pay | Admitting: Internal Medicine

## 2022-04-16 MED ORDER — FUROSEMIDE 20 MG PO TABS
20.0000 mg | ORAL_TABLET | Freq: Every day | ORAL | 0 refills | Status: DC
  Filled 2022-04-16: qty 30, 30d supply, fill #0

## 2022-04-16 MED ORDER — METOPROLOL SUCCINATE ER 25 MG PO TB24
25.0000 mg | ORAL_TABLET | Freq: Every day | ORAL | 0 refills | Status: DC
  Filled 2022-04-16: qty 30, 30d supply, fill #0

## 2022-04-16 MED ORDER — SPIRONOLACTONE 25 MG PO TABS
25.0000 mg | ORAL_TABLET | Freq: Every day | ORAL | 0 refills | Status: DC
  Filled 2022-04-16: qty 30, 30d supply, fill #0

## 2022-05-15 ENCOUNTER — Other Ambulatory Visit (HOSPITAL_COMMUNITY): Payer: Self-pay | Admitting: Internal Medicine

## 2022-05-15 ENCOUNTER — Other Ambulatory Visit (HOSPITAL_COMMUNITY): Payer: Self-pay

## 2022-05-15 MED ORDER — METOPROLOL SUCCINATE ER 25 MG PO TB24
25.0000 mg | ORAL_TABLET | Freq: Every day | ORAL | 0 refills | Status: DC
  Filled 2022-05-15: qty 30, 30d supply, fill #0

## 2022-05-15 MED ORDER — SPIRONOLACTONE 25 MG PO TABS
25.0000 mg | ORAL_TABLET | Freq: Every day | ORAL | 0 refills | Status: DC
  Filled 2022-05-15: qty 30, 30d supply, fill #0

## 2022-05-15 MED ORDER — FUROSEMIDE 20 MG PO TABS
20.0000 mg | ORAL_TABLET | Freq: Every day | ORAL | 0 refills | Status: DC
  Filled 2022-05-15: qty 30, 30d supply, fill #0

## 2022-06-09 ENCOUNTER — Encounter: Payer: Self-pay | Admitting: *Deleted

## 2022-06-19 ENCOUNTER — Other Ambulatory Visit (HOSPITAL_COMMUNITY): Payer: Self-pay | Admitting: Internal Medicine

## 2022-06-19 ENCOUNTER — Other Ambulatory Visit (HOSPITAL_COMMUNITY): Payer: Self-pay

## 2022-06-19 MED ORDER — FUROSEMIDE 20 MG PO TABS
20.0000 mg | ORAL_TABLET | Freq: Every day | ORAL | 0 refills | Status: DC
  Filled 2022-06-19: qty 30, 30d supply, fill #0

## 2022-06-19 MED ORDER — SPIRONOLACTONE 25 MG PO TABS
25.0000 mg | ORAL_TABLET | Freq: Every day | ORAL | 0 refills | Status: DC
  Filled 2022-06-19: qty 30, 30d supply, fill #0

## 2022-06-19 MED ORDER — METOPROLOL SUCCINATE ER 25 MG PO TB24
25.0000 mg | ORAL_TABLET | Freq: Every day | ORAL | 0 refills | Status: DC
  Filled 2022-06-19: qty 30, 30d supply, fill #0

## 2022-06-20 ENCOUNTER — Other Ambulatory Visit: Payer: Self-pay

## 2022-06-20 ENCOUNTER — Other Ambulatory Visit (HOSPITAL_COMMUNITY): Payer: Self-pay

## 2022-06-25 ENCOUNTER — Ambulatory Visit: Payer: Medicaid Other | Admitting: Gerontology

## 2022-06-25 ENCOUNTER — Telehealth: Payer: Self-pay | Admitting: Family

## 2022-06-25 NOTE — Telephone Encounter (Signed)
Patient called in stating that he would like a referral to a PCP. I asked the patient who he signed up for as a PCP and he stated he did not k now. I advised to call [facility] Medicaid and see who he can establish care as a PCP. Patient acknowledged and I asked him to call us back with a PCP name and Darylene Price, FNP, would be happy to refer him. Nothing further needed at this time.

## 2022-06-30 ENCOUNTER — Other Ambulatory Visit (HOSPITAL_COMMUNITY): Payer: Self-pay

## 2022-07-02 ENCOUNTER — Telehealth (HOSPITAL_COMMUNITY): Payer: Self-pay

## 2022-07-02 ENCOUNTER — Other Ambulatory Visit (HOSPITAL_COMMUNITY): Payer: Self-pay

## 2022-07-02 NOTE — Telephone Encounter (Signed)
Patient Advocate Encounter  Prior authorization is required for [facility]. [place] submitted and APPROVED on 07/02/22.  Key [id number] Effective: 07/02/22 - 07/02/23  Test claim returns $4 copay for 90 day supply.  Clista Bernhardt, CPhT Rx Patient Advocate Phone: [phone]

## 2022-07-11 ENCOUNTER — Other Ambulatory Visit (HOSPITAL_COMMUNITY): Payer: Self-pay

## 2022-07-11 ENCOUNTER — Other Ambulatory Visit (HOSPITAL_COMMUNITY): Payer: Self-pay | Admitting: Internal Medicine

## 2022-07-11 ENCOUNTER — Other Ambulatory Visit (HOSPITAL_BASED_OUTPATIENT_CLINIC_OR_DEPARTMENT_OTHER): Payer: Self-pay

## 2022-07-11 MED ORDER — SPIRONOLACTONE 25 MG PO TABS
25.0000 mg | ORAL_TABLET | Freq: Every day | ORAL | 0 refills | Status: DC
  Filled 2022-07-11 – 2022-07-25 (×2): qty 30, 30d supply, fill #0

## 2022-07-11 MED ORDER — FUROSEMIDE 20 MG PO TABS
20.0000 mg | ORAL_TABLET | Freq: Every day | ORAL | 0 refills | Status: DC
  Filled 2022-07-11 – 2022-07-25 (×2): qty 30, 30d supply, fill #0

## 2022-07-11 MED ORDER — METOPROLOL SUCCINATE ER 25 MG PO TB24
25.0000 mg | ORAL_TABLET | Freq: Every day | ORAL | 0 refills | Status: DC
  Filled 2022-07-11 – 2022-07-25 (×2): qty 30, 30d supply, fill #0

## 2022-07-25 ENCOUNTER — Telehealth (HOSPITAL_COMMUNITY): Payer: Self-pay

## 2022-07-25 ENCOUNTER — Other Ambulatory Visit (HOSPITAL_COMMUNITY): Payer: Self-pay

## 2022-07-25 NOTE — Telephone Encounter (Signed)
Patient Advocate Encounter  Prior authorization for Jardiance submitted and APPROVED. Test billing returns $4 copay for 90 day supply.  [facility] Effective: 07/25/22 - 07/25/23  Clista Bernhardt, CPhT Rx Patient Advocate Phone: [phone]

## 2022-07-29 ENCOUNTER — Other Ambulatory Visit (HOSPITAL_COMMUNITY): Payer: Self-pay

## 2022-07-31 NOTE — Congregational Nurse Program (Signed)
  Dept: [phone]   Congregational Nurse Program Note  Date of Encounter: 07/31/2022 Client to [facility] day center with request for assistance with finding a new PCP as he now has [facility]. [employer] is listed as his PCP on his insurance card RN contacted [employer] and was told to call back at the end of the month which will be for the June calendar. Client also requested assistance with getting an apt at the Heart Failure clinic. Apt made for 4/23 at 10:30.  RN also assisted client with setting up his My Chart account on his phone. Ronn Melena BSN, RN Past Medical History: Past Medical History:  Diagnosis Date   Aortic insufficiency due to bicuspid aortic valve 10/10/2021   Cardiac MRI 05/11/2021: Bicuspid aortic valve with fusion of the R & [place] cusps with severe AI (regurgitant fraction of 59%.   Chronic HFpEF    a.) TTE on 12/19/2017 --> LVEF 60-65%, no RWMAs, mild LA dilitation, PASP 44 mmHg   COPD (chronic obstructive pulmonary disease) (HCC)    Glaucoma    History of 2019 novel coronavirus disease (COVID-19) 09/15/2020   Hypertension    Nephrolithiasis    Polysubstance abuse ([place])    ETOH, marijuana, cocaine   Seizures ([place])    T2DM (type 2 diabetes mellitus) ([place])    a.)  Controlled with diet lifestyle modifications   Valvular cardiomyopathy ([place]) 12/19/2017   Cardiac MRI 11/08/2021: Severe biventricular dilation with moderate to severe dysfunction: LVEF ~31, RVEF ~30%.  Severe AI 2/2 Bicusid AoV (R&[place] cusp fusion). Ascending Ao 42 mm   Valvular heart disease    a.) TTE on 12/19/2017 --> mild MV regurgitation; moderate AV regurgitation    Encounter Details:  CNP Questionnaire - 07/31/22 0945       Questionnaire   Ask client: Do you give verbal consent for me to treat you today? Yes    Student Assistance N/A    Location Patient [place]    Visit Setting with Client Organization    Patient Status  Unknown   client lives in a boarding room   Insurance Medicaid   United [employer] care   Insurance/Financial Assistance Referral N/A    Medication N/A    Medical Provider Yes   [facility], Darylene Price FNP   Screening Referrals Made N/A    Medical Referrals Made N/A    Medical Appointment Made Other   assisted client in making a cardiology apt   Recently w/o PCP, now 1st time PCP visit completed due to CNs referral or appointment made [place]   client has food stamps   Transportation N/A   client has an electric bike that he rides locally, can also take the [employer] bus. His wife also provides some transportaion.  *Does need assistance depending on timing of appointments   Housing/Utilities N/A    Interpersonal Safety N/A    Interventions Set up MyChart;Navigate Healthcare System;Advocate/Support    Abnormal to Normal Screening Since Last CN Visit N/A    Screenings CN Performed N/A    Sent Client to Lab for: N/A    Did client attend any of the following based off CNs referral or appointments made? N/A    ED Visit Averted N/A    Life-Saving Intervention Made N/A

## 2022-08-06 NOTE — Congregational Nurse Program (Signed)
  Dept: [phone]   Congregational Nurse Program Note  Date of Encounter: 08/06/2022 Client to [facility] day center with request for assistance with his My Chart sign in. Assistance provided, information given in writing regarding My Chart code and his apt with [facility]. Francesco Runner BSN, RN Past Medical History: Past Medical History:  Diagnosis Date   Aortic insufficiency due to bicuspid aortic valve 10/10/2021   Cardiac MRI 05/11/2021: Bicuspid aortic valve with fusion of the R & [place] cusps with severe AI (regurgitant fraction of 59%.   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  Severe AI 2/2 Bicusid AoV (R&[place] cusp fusion). Ascending Ao 42 mm   Valvular heart disease    a.) TTE on 12/19/2017 --> mild MV regurgitation; moderate AV regurgitation    Encounter Details:  CNP Questionnaire - 08/06/22 0915       Questionnaire   Ask client: Do you give verbal consent for me to treat you today? Yes    Student Assistance N/A    Location Patient Served  [facility]    Visit Setting with Client Organization    Patient Status Unknown   client lives in a boarding room   Insurance Medicaid   United [employer] care   Insurance/Financial Assistance Referral N/A    Medication N/A    Medical Provider Yes   [facility], Clarisa Kindred FNP   Screening Referrals Made N/A    Medical Referrals Made N/A    Medical Appointment Made N/A   assisted client in  making a cardiology apt   Recently w/o PCP, now 1st time PCP visit completed due to CNs referral or appointment made N/A    Food N/A   client has food stamps   Transportation N/A   client has an electric bike that he rides locally, can also take the [place] bus. His wife also provides some transportaion.  *Does need assistance depending on timing of appointments   Housing/Utilities N/A    Interpersonal Safety N/A    Interventions Set up MyChart    Abnormal to Normal Screening Since Last CN Visit N/A    Screenings CN Performed N/A    Sent Client to Lab for: N/A    Did client attend any of the following based off CNs referral or appointments made? N/A    ED Visit Averted N/A    Life-Saving Intervention Made N/A

## 2022-08-19 ENCOUNTER — Encounter: Payer: Medicaid Other | Admitting: Family

## 2022-08-19 ENCOUNTER — Telehealth: Payer: Self-pay | Admitting: Family

## 2022-08-19 NOTE — Progress Notes (Deleted)
Patient ID: Darius [name], male    DOB: March 06, 1971, 52 y.o.   MRN: [id number]  Primary cardiologist: Bryan Lemma, MD (last seen 08/23) PCP: Pcp, No  HPI  Darius [name] is a 52 y/o male with a history of HTN, HF, current tobacco, alcohol and drug use.   Echo 09/15/21: EF of 40-45% along with mild LVH/LAE and trivial Darius. Bright peckled myocardium ,consider infiltrative process. Consider Amyloid/Sarcoidosis.Echo 12/19/17: EF of 60-65% along with mild Darius and mildly elevated PA pressure of 44 mm Hg.   RHC/LHC 12/03/21: Left dominant normal coronary arteries. LVEDP 41 mmHg.  Aortic pressure 114/62 mmHg. Mild pulmonary hypertension, mean 33 mmHg.  Mean capillary wedge pressure 26 mmHg.  WHO group 2 etiology. Cardiac output 4.03 L/min with index 2.08. Normal right heart pressures.  cMRI 11/08/21:  1. Bicuspid aortic valve with fusion of right and noncoronary cusps. Severe aortic regurgitation (regurgitant fraction 59%)  2.  Severe LV dilatation with moderate systolic dysfunction (EF 31%)  3.  Severe RV dilatation with moderate systolic dysfunction (EF 30%)  4. RV insertion site late gadolinium enhancement, which is a nonspecific finding often seen in setting of elevated pulmonary pressures  5. Dilated ascending aorta measuring 42mm  Has not been admitted or been in the ED in the last 6 months.    He presents today for a HF f/u visit with a chief complaint of    Since last here almost a year ago, he's had a RHC/LHC and cMRI with recommendation of needing his aortic valve possible replaced. He did not show for his appt with AD[facility] provider Shirlee Latch) 10/23.  Past Medical History:  Diagnosis Date   Aortic insufficiency due to bicuspid aortic valve 10/10/2021   Cardiac MRI 05/11/2021: Bicuspid aortic valve with fusion of the R & [name] cusps with severe AI (regurgitant fraction of 59%.   Chronic HFpEF    a.) TTE on 12/19/2017 --> LVEF 60-65%, no RWMAs, mild LA dilitation, PASP 44 mmHg   COPD (chronic  obstructive pulmonary disease) (HCC)    Glaucoma    History of 2019 novel coronavirus disease (COVID-19) 09/15/2020   Hypertension    Nephrolithiasis    Polysubstance abuse (HCC)    ETOH, marijuana, cocaine   Seizures (HCC)    T2DM (type 2 diabetes mellitus) (HCC)    a.)  Controlled with diet lifestyle modifications   Valvular cardiomyopathy (HCC) 12/19/2017   Cardiac MRI 11/08/2021: Severe biventricular dilation with moderate to severe dysfunction: LVEF ~31, RVEF ~30%.  Severe AI 2/2 Bicusid AoV (R&[place] cusp fusion). Ascending Ao 42 mm   Valvular heart disease    a.) TTE on 12/19/2017 --> mild MV regurgitation; moderate AV regurgitation   Past Surgical History:  Procedure Laterality Date   Cardiac MRI  11/08/2021   Bicuspid aortic valve with fusion of the right and noncoronary cusps-severe AI (regurgitant fraction 59%); Severe biventricular dilation with moderate dysfunction (LVEF estimated 39%, RVEF estimated 30%); ascending aorta measuring 42 mm.   CYSTOSCOPY/URETEROSCOPY/HOLMIUM LASER/STENT PLACEMENT Right 01/29/2021   Procedure: CYSTOSCOPY/URETEROSCOPY/HOLMIUM LASER/STENT PLACEMENT;  Surgeon: Riki Altes, MD;  Location: [facility] ORS;  Service: Urology;  Laterality: Right;   KIDNEY STONE SURGERY     05/2021   NO PAST SURGERIES     RIGHT/LEFT HEART CATH AND CORONARY ANGIOGRAPHY N/A 12/03/2021   Procedure: RIGHT/LEFT HEART CATH AND CORONARY ANGIOGRAPHY;  Surgeon: Lyn Records, MD;  Location: [facility];  Service: Cardiovascular;  Laterality: N/A;   Family History  Problem Relation Age  of Onset   Hypertension Mother    Kidney disease Mother    Diabetes Mother    Mitral valve prolapse Mother    Diabetes Father    Hypertension Maternal Grandmother    Mitral valve prolapse Maternal Grandmother    Other Half-Brother        accident   Social History   Tobacco Use   Smoking status: Some Days    Packs/day: 0.25    Years: 35.00    Additional pack years: 0.00    Total pack  years: 8.75    Types: Cigarettes    Last attempt to quit: 08/2020    Years since quitting: 1.9   Smokeless tobacco: Never   Tobacco comments:    Smokes 4-5 cigarettes per week.   Substance Use Topics   Alcohol use: Yes    Alcohol/week: 2.0 standard drinks of alcohol    Types: 2 Cans of beer per week    Comment: socially   Allergies  Allergen Reactions   Avelox [Moxifloxacin] Other (See Comments)    Thoracic Aneurysm.   Ciprofloxacin Other (See Comments)    Has thoracic Aneurysm   Penicillins Other (See Comments)    Patient states that mother is highly allergic. 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  Has patient had a PCN reaction causing immediate rash, facial/tongue/throat swelling, SOB or lightheadedness with hypotension: No Has patient had a PCN reaction causing severe rash involving mucus membranes or skin necrosis: No Has patient had a PCN reaction that required hospitalization Yes Has patient had a PCN reaction occurring within the last 10 years: No If all of the above answers are "N     Review of Systems  Constitutional:  Negative for appetite change and fatigue.  HENT:  Negative for congestion, postnasal drip and sore throat.   Eyes: Negative.   Respiratory:  Negative for apnea, cough and shortness of breath.   Cardiovascular:  Negative for chest pain, palpitations and leg swelling.  Gastrointestinal:  Negative for abdominal distention and abdominal pain.       + reflux/ burning sensation   Endocrine: Negative.   Genitourinary:  Negative for difficulty urinating and dysuria.  Musculoskeletal:  Negative for back pain and neck pain.  Skin: Negative.   Allergic/Immunologic: Negative.   Neurological:  Negative for dizziness, weakness and light-headedness.  Hematological:  Negative for adenopathy. Does not bruise/bleed easily.  Psychiatric/Behavioral:  Negative for dysphoric mood and sleep disturbance (sleeping on 2 pillows). The patient is  nervous/anxious.      Physical Exam Vitals and nursing note reviewed.  Constitutional:      Appearance: Normal appearance. He is well-developed.  HENT:     Head: Normocephalic and atraumatic.  Cardiovascular:     Rate and Rhythm: Normal rate and regular rhythm.  Pulmonary:     Effort: Pulmonary effort is normal. No respiratory distress.     Breath sounds: No wheezing, rhonchi or rales.  Abdominal:     General: Abdomen is flat. There is no distension.     Palpations: Abdomen is soft.     Tenderness: There is no abdominal tenderness.  Musculoskeletal:        General: No tenderness.     Cervical back: Normal range of motion and neck supple.     Right lower leg: No edema.     Left lower leg: No edema.  Skin:    General: Skin is warm and dry.  Neurological:     General:  No focal deficit present.     Mental Status: He is alert and oriented to person, place, and time.  Psychiatric:        Mood and Affect: Mood normal.        Behavior: Behavior normal.        Thought Content: Thought content normal.    Assessment & Plan:  1: Chronic heart failure with now reduced ejection fraction- - NYHA class I - euvolemic today - weighing daily & he was reminded to call for an overnight weight gain of >2 pounds or a weekly weight gain of >5 pounds - weight 157 pounds from last visit here 10 months ago  - Echo 09/15/21: EF of 40-45% along with mild LVH/LAE and trivial Darius. Bright peckled myocardium ,consider infiltrative process. Consider Amyloid/Sarcoidosis.Echo 12/19/17: EF of 60-65% along with mild Darius and mildly elevated PA pressure of 44 mm Hg.   - not adding salt to his food & reminded to closely follow a low sodium diet - BNP 12/13/21 was 3596.1  2: HTN- - BP  - saw PCP at [facility]  - BMP on 12/13/21 reviewed and showed sodium 141, potassium 3.9, creatinine 1.39 and GFR >60  3: Tobacco use- - no longer smoking/ vaping - no further drug use since recent admission - continued  cessation discussed  4: Aneurysm of ascending aorta without rupture- - saw cardiothoracic 08/05/21; returns in 6 months - chest CT on 04/22/21 showed 4.0 cm ascending thoracic aorta  5: Bicuspid aortic valve- - saw cardiology Herbie [place]) 08/23 - NS for [facility] provider Shirlee Latch) 10/23 - RHC/LHC 12/03/21: Left dominant normal coronary arteries. LVEDP 41 mmHg.  Aortic pressure 114/62 mmHg. Mild pulmonary hypertension, mean 33 mmHg.  Mean capillary wedge pressure 26 mmHg.  WHO group 2 etiology. Cardiac output 4.03 L/min with index 2.08. Normal right heart pressures. - cMRI 11/08/21:  1. Bicuspid aortic valve with fusion of right and noncoronary cusps. Severe aortic regurgitation (regurgitant fraction 59%)  2.  Severe LV dilatation with moderate systolic dysfunction (EF 31%)  3.  Severe RV dilatation with moderate systolic dysfunction (EF 30%)  4. RV insertion site late gadolinium enhancement, which is a nonspecific finding often seen in setting of elevated pulmonary pressures  5. Dilated ascending aorta measuring 42mm

## 2022-08-19 NOTE — Telephone Encounter (Signed)
Patient did not show for his [facility] appointment on 08/19/22. This is the 6th appt he has missed.

## 2022-08-22 ENCOUNTER — Other Ambulatory Visit (HOSPITAL_COMMUNITY): Payer: Self-pay

## 2022-08-22 ENCOUNTER — Other Ambulatory Visit (HOSPITAL_COMMUNITY): Payer: Self-pay | Admitting: Internal Medicine

## 2022-08-22 MED ORDER — SPIRONOLACTONE 25 MG PO TABS
25.0000 mg | ORAL_TABLET | Freq: Every day | ORAL | 0 refills | Status: DC
  Filled 2022-08-22: qty 30, 30d supply, fill #0

## 2022-08-22 MED ORDER — METOPROLOL SUCCINATE ER 25 MG PO TB24
25.0000 mg | ORAL_TABLET | Freq: Every day | ORAL | 0 refills | Status: DC
  Filled 2022-08-22: qty 30, 30d supply, fill #0

## 2022-08-22 MED ORDER — FUROSEMIDE 20 MG PO TABS
20.0000 mg | ORAL_TABLET | Freq: Every day | ORAL | 0 refills | Status: DC
  Filled 2022-08-22: qty 30, 30d supply, fill #0

## 2022-09-19 ENCOUNTER — Other Ambulatory Visit (HOSPITAL_COMMUNITY): Payer: Self-pay | Admitting: Internal Medicine

## 2022-09-19 ENCOUNTER — Other Ambulatory Visit (HOSPITAL_COMMUNITY): Payer: Self-pay

## 2022-09-26 ENCOUNTER — Other Ambulatory Visit (HOSPITAL_COMMUNITY): Payer: Self-pay

## 2022-09-26 NOTE — Telephone Encounter (Signed)
Patient needs to scheduled appointment

## 2022-09-29 IMAGING — CT CT CHEST LUNG CANCER SCREENING LOW DOSE W/O CM
1 series · 10 of 10 positions shown, 13 images · non-contrast
Comparison: None.

CLINICAL DATA: 50-year-old male current smoker, with 35 pack-year
history of smoking, for initial lung cancer screening

EXAM:
CT CHEST WITHOUT CONTRAST LOW-DOSE FOR LUNG CANCER SCREENING
TECHNIQUE: Multidetector CT imaging of the chest was performed following the
standard protocol without IV contrast.

[ct lung segmentation data · axial · 0.62mm/px · z∈[-487,-487]mm · 10 of 332 frames shown]
[frame 1/332  mediastinal]
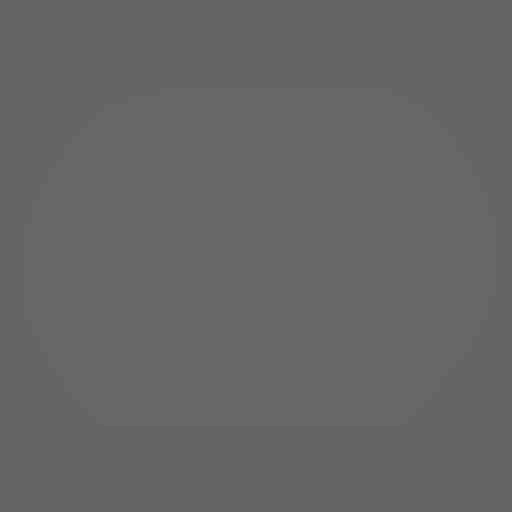
[frame 1/332  lung]
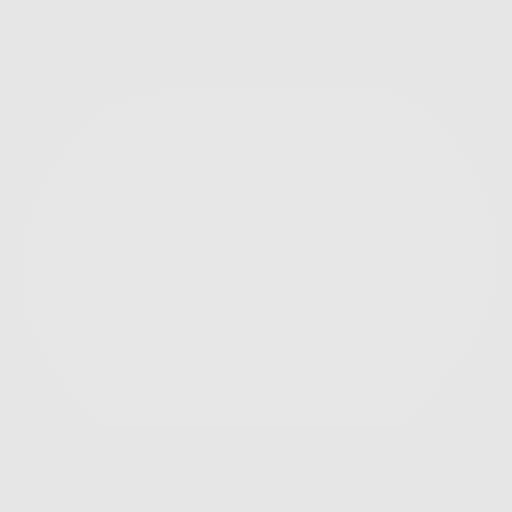
[frame 37/332  lung]
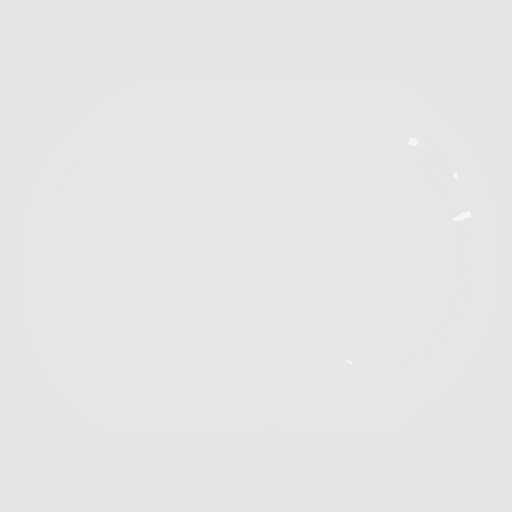
[frame 74/332  lung]
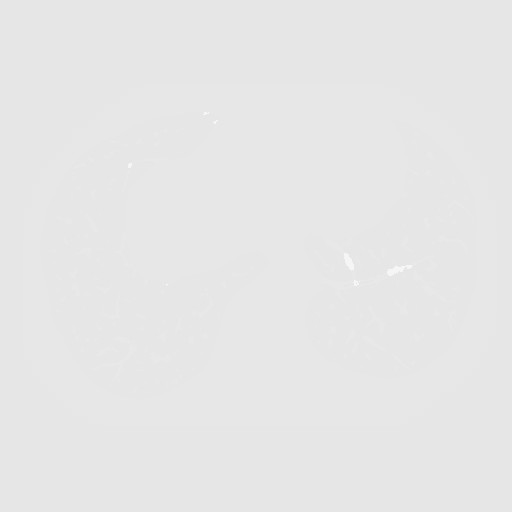
[frame 111/332  lung]
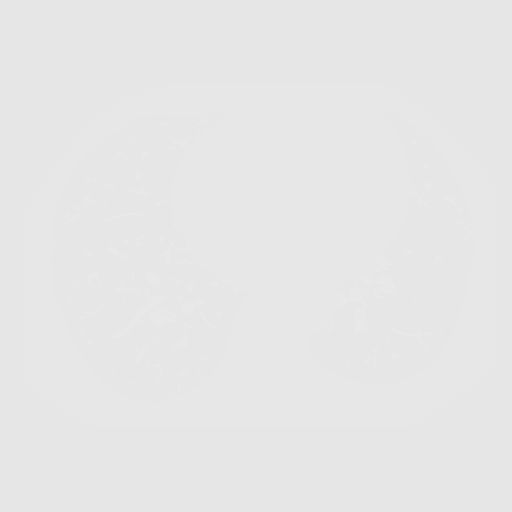
[frame 148/332  mediastinal]
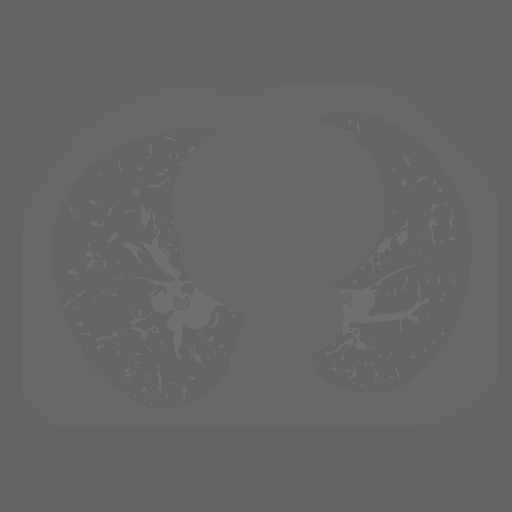
[frame 148/332  lung]
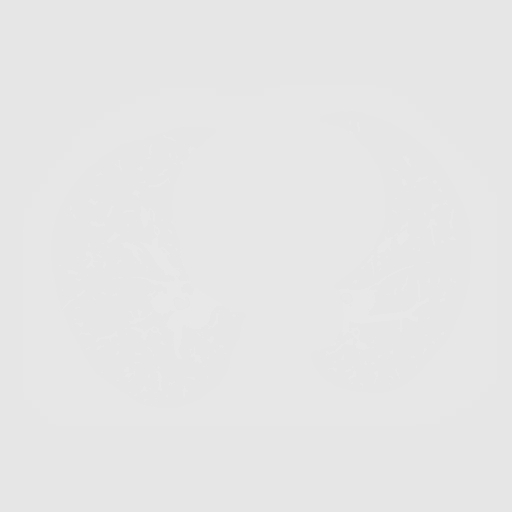
[frame 184/332  lung]
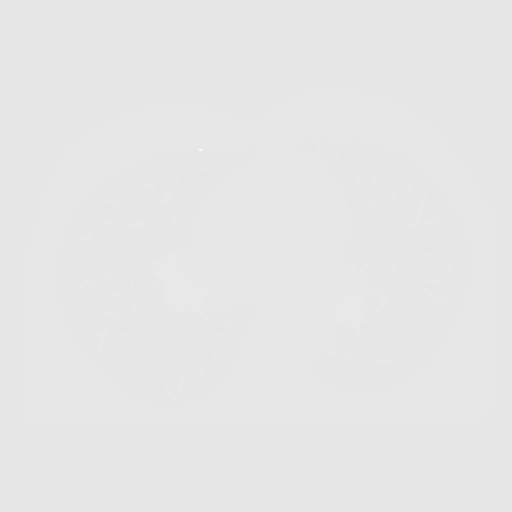
[frame 221/332  lung]
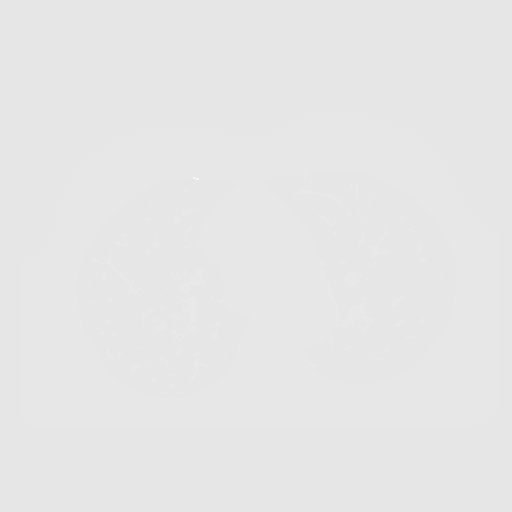
[frame 258/332  lung]
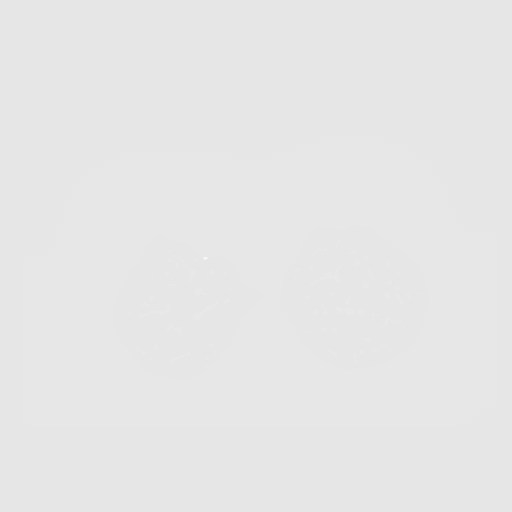
[frame 295/332  mediastinal]
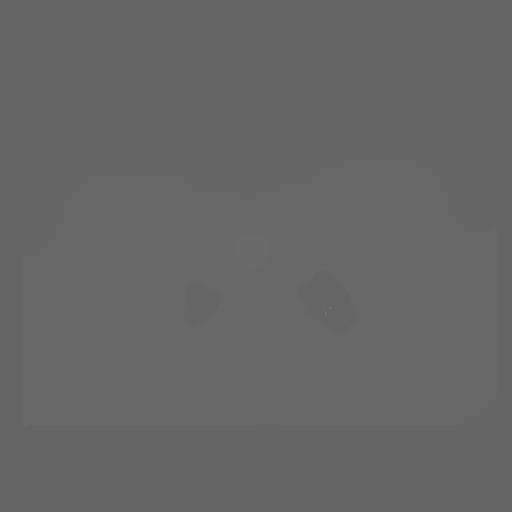
[frame 295/332  lung]
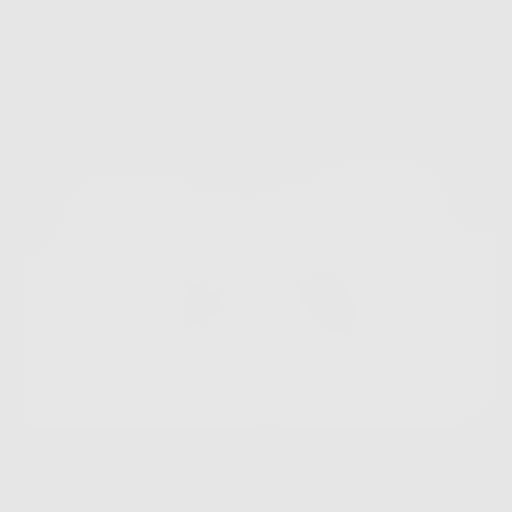
[frame 332/332  lung]
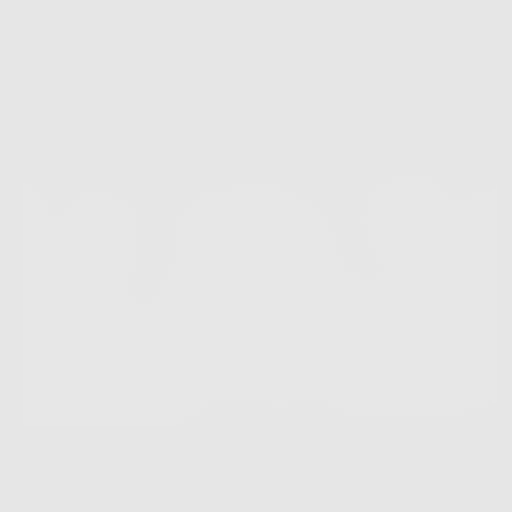

[10 of 10 positions shown; findings below may reference images not displayed]

FINDINGS: Cardiovascular: Mild cardiomegaly.  No pericardial effusion.

No evidence of thoracic aortic aneurysm. Very mild atherosclerotic
calcifications of the aortic arch.

Mild coronary atherosclerosis of the LAD.

Mediastinum/Nodes: No suspicious mediastinal lymphadenopathy.

Visualized thyroid is unremarkable.

Lungs/Pleura: Mild centrilobular and paraseptal emphysematous
changes the lung apices.

No focal consolidation. Mild linear scarring/atelectasis in the left
lower lobe.

No suspicious pulmonary nodules.

No pleural effusion or pneumothorax.

Upper Abdomen: Visualized upper abdomen is grossly unremarkable.

Musculoskeletal: Visualized osseous structures are within normal
limits.
IMPRESSION: Lung-RADS 1, negative. Continue annual screening with low-dose chest
CT without contrast in 12 months.

Aortic Atherosclerosis (0D0P9-HAO.O) and Emphysema (0D0P9-Y9M.1).
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
## 2022-10-06 ENCOUNTER — Other Ambulatory Visit (HOSPITAL_COMMUNITY): Payer: Self-pay | Admitting: Internal Medicine

## 2022-10-06 ENCOUNTER — Other Ambulatory Visit (HOSPITAL_COMMUNITY): Payer: Self-pay

## 2022-10-07 ENCOUNTER — Other Ambulatory Visit (HOSPITAL_COMMUNITY): Payer: Self-pay

## 2022-10-09 ENCOUNTER — Other Ambulatory Visit (HOSPITAL_COMMUNITY): Payer: Self-pay

## 2022-10-09 ENCOUNTER — Other Ambulatory Visit (HOSPITAL_COMMUNITY): Payer: Self-pay | Admitting: Cardiology

## 2022-10-09 MED ORDER — FUROSEMIDE 20 MG PO TABS
20.0000 mg | ORAL_TABLET | Freq: Every day | ORAL | 0 refills | Status: DC
  Filled 2022-10-09: qty 30, 30d supply, fill #0

## 2022-10-09 MED ORDER — SPIRONOLACTONE 25 MG PO TABS
25.0000 mg | ORAL_TABLET | Freq: Every day | ORAL | 0 refills | Status: DC
  Filled 2022-10-09: qty 30, 30d supply, fill #0

## 2022-10-09 MED ORDER — METOPROLOL SUCCINATE ER 25 MG PO TB24
25.0000 mg | ORAL_TABLET | Freq: Every day | ORAL | 0 refills | Status: DC
  Filled 2022-10-09: qty 30, 30d supply, fill #0

## 2022-10-22 NOTE — Progress Notes (Signed)
ADVANCED [facility] CLINIC CONSULT NOTE   Primary Care: pending Primary Cardiologist: Bryan Lemma, MD HF Cardiologist: Dr. Gala Romney  HPI: 52 y.o.. male with history of chronic systolic CHF/NICM, bicuspid aortic valve with severe AI, polysubstance abuse (cocaine, ETOH, tobacco, marijuana), DM II.    Echo 11/2017: EF 60-65%, bicuspid aortic valve with mild to moderate AI   Echo 5/23: EF 40-45%, LV moderate to severely dilated, mild LVH, mild to moderate AI, bright speckled myocardium raising concern for infiltrative process, mildly reduced RV   cMRI 11/08/21: Severely dilated LV with EF 31%, severely dilated RV with moderately reduced function (RVEF 30%), nonspecific RV insertion LGE which can be seen in setting of elevated pulmonary pressures, bicuspid aortic valve with severe AI, dilated ascending aorta measuring 42 mm   Admitted 8/23 with a/c CHF. BNP > 3,000. Had been eating out and consuming heavy alcohol prior to admission. R/LHC: No CAD, RA 6, PA 49/24 (33), PCWP 26, LVEDP 41, Fick CO/CI 4.03/2.08. He was diuresed with IV lasix. GDMT optimized. Case reviewed with TCTS and was scheduled for outpatient visit for consideration of AVR.    Initial post hospital TOC visit 8/23, NYHA II and volume overloaded. Referred to AHF with plans to follow until potential AVR, at which time could potentially graduate AHF clinic if LV function improved. Follow up 8/23 with Dr. Cliffton Asters, planned for mechanical AV replacement, needed dental clearance.   Unfortunately lost to follow up.  Today he returns for AHF follow up. Overall feeling fine. He has SOB walking up steps or lifting. Occasional dizziness, no falls. Denies palpitations, CP, edema, or PND/Orthopnea. Appetite ok. No fever or chills. Weight at home 155 pounds. Taking all medications. Uses cocaine weekly, drinks ETOH on weekends, uses THC on weekends. He has been taking Entresto once daily. Wife works at [employer].  Drinks a lot of Dr. Reino Kent & > 2 L  fluid/day. He lives in a boarding room in [place], planning on moving in with his mom, who lives in [place] soon.   Cardiac Studies - R/LHC (8/23): No CAD; RA 6, PA 49/24 (33), PCWP 26, LVEDP 41, Fick CO/CI 4.03/2.08.  - cMRI (7/23): LVEF 31%, severely dilated RV, RVEF 30%, nonspecific RV insertion LGE which can be seen in setting of elevated pulmonary pressures, bicuspid aortic valve with severe AI, dilated ascending aorta measuring 42 mm  - Echo (5/23): EF 40-45%, LV moderate to severely dilated, mild LVH, mild to moderate AI, bright speckled myocardium raising concern for infiltrative process, mildly reduced RV  - Echo (8/19): EF 60-65%, bicuspid aortic valve with mild to moderate AI  Review of Systems: [y] = yes, [ ]  = no   General: Weight gain [ ] ; Weight loss [ ] ; Anorexia [ ] ; Fatigue [ ] ; Fever [ ] ; Chills [ ] ; Weakness [ ]   Cardiac: Chest pain/pressure [ ] ; Resting SOB [ ] ; Exertional SOB Cove.Etienne ]; Orthopnea [ ] ; Pedal Edema [ ] ; Palpitations [ ] ; Syncope [ ] ; Presyncope [ ] ; Paroxysmal nocturnal dyspnea[ ]   Pulmonary: Cough [ ] ; Wheezing[ ] ; Hemoptysis[ ] ; Sputum [ ] ; Snoring [ ]   GI: Vomiting[ ] ; Dysphagia[ ] ; Melena[ ] ; Hematochezia [ ] ; Heartburn[ ] ; Abdominal pain [ ] ; Constipation [ ] ; Diarrhea [ ] ; BRBPR [ ]   GU: Hematuria[ ] ; Dysuria [ ] ; Nocturia[ ]   Vascular: Pain in legs with walking [ ] ; Pain in feet with lying flat [ ] ; Non-healing sores [ ] ; Stroke [ ] ; TIA [ ] ; Slurred speech [ ] ;  Neuro:  Headaches[ ] ; Vertigo[ ] ; Seizures[ ] ; Paresthesias[ ] ;Blurred vision [ ] ; Diplopia [ ] ; Vision changes [ ]   Ortho/Skin: Arthritis [ ] ; Joint pain [ ] ; Muscle pain [ ] ; Joint swelling [ ] ; Back Pain [ ] ; Rash [ ]   Psych: Depression[ ] ; Anxiety[y ]  Heme: Bleeding problems [ ] ; Clotting disorders [ ] ; Anemia [ ]   Endocrine: Diabetes Cove.Etienne ]; Thyroid dysfunction[ ]   Past Medical History:  Diagnosis Date   Aortic insufficiency due to bicuspid aortic valve 10/10/2021   Cardiac MRI 05/11/2021:  Bicuspid aortic valve with fusion of the R & Tappan cusps with severe AI (regurgitant fraction of 59%.   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  Severe AI 2/2 Bicusid AoV (R&Alliance cusp fusion). Ascending Ao 42 mm   Valvular heart disease    a.) TTE on 12/19/2017 --> mild MV regurgitation; moderate AV regurgitation   Current Outpatient Medications  Medication Sig Dispense Refill   albuterol (PROVENTIL HFA) 108 (90 Base) MCG/ACT inhaler Inhale 2 puffs into the lungs once every 6 (six) hours as needed for wheezing or shortness of breath. 18 g 3   empagliflozin (JARDIANCE) 10 MG TABS tablet Take 1 tablet (10 mg total) by mouth daily before breakfast. 30 tablet 11   feeding supplement (ENSURE ENLIVE / ENSURE PLUS) LIQD Take 237 mLs by mouth 2 (two) times daily between meals. (Patient taking differently: Take 237 mLs by mouth daily.) 237 mL 12   furosemide (LASIX) 20 MG tablet Take 1 tablet (20 mg total) by mouth daily. 30 tablet 0   metoprolol succinate (TOPROL-XL) 25 MG 24 hr tablet Take 1 tablet (25 mg total) by mouth daily. 30 tablet 0   sacubitril-valsartan (ENTRESTO) 24-26 MG Take 1 tablet by mouth 2 (two) times daily. 60 tablet 11   spironolactone (ALDACTONE) 25 MG tablet Take 1 tablet (25 mg total) by mouth daily. 30 tablet 0   No current facility-administered medications for this encounter.   Allergies  Allergen Reactions   Avelox [Moxifloxacin]  Other (See Comments)    Thoracic Aneurysm.   Ciprofloxacin Other (See Comments)    Has thoracic Aneurysm   Penicillins Other (See Comments)    Patient states that mother is highly allergic. 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  Has patient had a PCN reaction causing immediate rash, facial/tongue/throat swelling, SOB or lightheadedness with hypotension: No Has patient had a PCN reaction causing severe rash involving mucus membranes or skin necrosis: No Has patient had a PCN reaction that required hospitalization Yes Has patient had a PCN reaction occurring within the last 10 years: No If all of the above answers are "N   Social History   Socioeconomic History   Marital status: Legally Separated    Spouse name: Jasmine December   Number of children: 2   Years of education: Not on file   Highest education level: GED or  equivalent  Occupational History   Occupation: disability  Tobacco Use   Smoking status: Some Days    Packs/day: 0.25    Years: 35.00    Additional pack years: 0.00    Total pack years: 8.75    Types: Cigarettes    Last attempt to quit: 08/2020    Years since quitting: 2.1   Smokeless tobacco: Never   Tobacco comments:    Smokes 4-5 cigarettes per week.   Vaping Use   Vaping Use: Former   Quit date: 05/12/2021   Substances: Nicotine, Flavoring  Substance and Sexual Activity   Alcohol use: Yes    Alcohol/week: 2.0 standard drinks of alcohol    Types: 2 Cans of beer per week    Comment: socially   Drug use: Yes    Types: Marijuana, Other-see comments, "Crack" cocaine    Comment: last use of crack cocaine 01/2021 current use of marijuana   Sexual activity: Yes  Other Topics Concern   Not on file  Social History Narrative      > From cardiology clinic note on October 10, 2021:      He is "doing the best he can "to cut out cigarettes and drugs.  He is down to about 5 cigarettes a week-not buying any, and and only smoking but he can borrow.  However  he was with some friends a couple days ago and was smoking some marijuana that "unbeknownst to him "was laced with "powder ".  He had a little bit of heart fast heart rates and some discomfort in his chest after smoking "blunt".   Social Determinants of Health   Financial Resource Strain: High Risk (12/13/2021)   Overall Financial Resource Strain (CARDIA)    Difficulty of Paying Living Expenses: Very hard  Food Insecurity: No Food Insecurity (10/31/2021)   Hunger Vital Sign    Worried About Running Out of Food in the Last Year: Never true    Ran Out of Food in the Last Year: Never true  Transportation Needs: Unmet Transportation Needs (12/04/2021)   PRAPARE - Administrator, Civil Service (Medical): Yes    Lack of Transportation (Non-Medical): Yes  Physical Activity: Unknown (03/01/2018)   Exercise Vital Sign    Days of Exercise per Week: 7 days    Minutes of Exercise per Session: Patient declined  Stress: Stress Concern Present (03/01/2018)   [employer] of Occupational Health - Occupational Stress Questionnaire    Feeling of Stress : Very much  Social Connections: Moderately Isolated (03/01/2018)   Social Connection and Isolation Panel [NHANES]    Frequency of Communication with Friends and Family: Never    Frequency of Social Gatherings with Friends and Family: Never    Attends Religious Services: Never    Database administrator or Organizations: No    Attends Banker Meetings: Never    Marital Status: Married  Catering manager Violence: Not At Risk (03/01/2018)   Humiliation, Afraid, Rape, and Kick questionnaire    Fear of Current or Ex-Partner: No    Emotionally Abused: No    Physically Abused: No    Sexually Abused: No   Family History  Problem Relation Age of Onset   Hypertension Mother    Kidney disease Mother    Diabetes Mother    Mitral valve prolapse Mother    Diabetes Father    Hypertension Maternal Grandmother    Mitral valve prolapse  Maternal Grandmother    Other Half-Brother  accident   BP (!) 100/58   Pulse 62   Wt 69.2 kg (152 lb 9.6 oz)   SpO2 98%   BMI 19.59 kg/m   Wt Readings from Last 3 Encounters:  10/24/22 69.2 kg (152 lb 9.6 oz)  12/20/21 69.4 kg (153 lb)  12/13/21 69.6 kg (153 lb 6.4 oz)   PHYSICAL EXAM: General:  NAD. No resp difficulty, walked into clinic, thin HEENT: Normal Neck: Supple. JVP 10. Carotids 2+ bilat; no bruits. No lymphadenopathy or thryomegaly appreciated. Cor: PMI nondisplaced. Regular rate & rhythm. No rubs, gallops or murmurs. Lungs: Clear Abdomen: Soft, nontender, nondistended. No hepatosplenomegaly. No bruits or masses. Good bowel sounds. Extremities: No cyanosis, clubbing, rash, edema Neuro: Alert & oriented x 3, cranial nerves grossly intact. Moves all 4 extremities w/o difficulty. Affect pleasant.  ECG: (personally reviewed): NSR 61 bpm  ReDs: 38%  ASSESSMENT & PLAN: Chronic systolic CHF/NICM - Echo (8/19): EF 60-65%, mild to moderate AI - Echo (5/23): EF 40-45%, LV moderate to severely dilated, mild LVH, mild to moderate AI, bright speckled myocardium raising concern for infiltrative process, mildly reduced RV - cMRI (11/08/21): Severely dilated LVEF 31%, severely dilated RV with moderately reduced function (RVEF 30%), nonspecific RV insertion LGE which can be seen in setting of elevated pulmonary pressures, bicuspid aortic valve with severe AI, dilated ascending aorta measuring 42 mm - R/LHC (8/23): No CAD, RA 6, PA 49/24 (33), PCWP 26, LVEDP 41, Fick CO/CI 4.03/2.08 - Etiology cardiomyopathy likely valvular disease +/- component of cocaine and ETOH abuse. Has upcoming appointment with TCTS for consideration of AVR - NYHA II. Volume mildly elevated today. ReDS 38%. GDMT titration limited by soft BP. - Increase Lasix to 40 mg daily, add 20 KCL daily. - Continue Toprol XL 25 mg daily. - Continue Entresto 24/26 mg bid. - Continue spiro 25 mg daily. - Continue  Jardiance 10 mg daily. - Needs to refrain from ETOH and cocaine. - Labs today. Repeat BMET in 10 days. - Repeat echo   2. Bicuspid aortic valve/severe aortic insufficiency - Most recent echo as above. - Discussed screening of 1st degree relatives - Has seen Dr. Cliffton Asters (8/23), was planning AVR, pending dental eval but lost to follow up. - Refer back to dentistry (needs and will arrange follow up with CVTS   3. Ascending aortic aneurysm - Measured 4 cm on CT chest 12/22 - Continue beta blocker   4. Polysubstance abuse - Tobacco, ETOH, marijuana and cocaine - Complete cessation discussed.  5. SDOH - Needs PCP, given list today. Will also refer to [facility] if he does move back to [place] with his mother. - Has food insecurity, given bag of food today. - Engage HFSW to help with other resources (substance abuse, transportation).  Follow up in 3 months with Dr. Gala Romney [facility] or [place], whichever patient can get to). Will try to get appt with CVTS to discuss moving forward with AVR.  Prince Rome, FNP-BC 10/24/22

## 2022-10-23 ENCOUNTER — Telehealth (HOSPITAL_COMMUNITY): Payer: Self-pay

## 2022-10-23 NOTE — Telephone Encounter (Signed)
Tried calling patient and both of his numbers were out of service to confirm/remind patient of their appointment at the [facility] on 10/24/22.

## 2022-10-24 ENCOUNTER — Other Ambulatory Visit (HOSPITAL_COMMUNITY): Payer: Self-pay

## 2022-10-24 ENCOUNTER — Ambulatory Visit (HOSPITAL_COMMUNITY)
Admission: RE | Admit: 2022-10-24 | Discharge: 2022-10-24 | Disposition: A | Discharge status: Home / Self Care | Payer: Medicaid Other | Source: Ambulatory Visit | Attending: Family Medicine | Admitting: Family Medicine

## 2022-10-24 ENCOUNTER — Encounter (HOSPITAL_COMMUNITY): Payer: Self-pay

## 2022-10-24 ENCOUNTER — Encounter (HOSPITAL_COMMUNITY): Payer: Medicaid Other

## 2022-10-24 VITALS — BP 100/58 | HR 62 | Wt 152.6 lb

## 2022-10-24 DIAGNOSIS — R42 Dizziness and giddiness: Secondary | ICD-10-CM | POA: Insufficient documentation

## 2022-10-24 DIAGNOSIS — Z7984 Long term (current) use of oral hypoglycemic drugs: Secondary | ICD-10-CM | POA: Insufficient documentation

## 2022-10-24 DIAGNOSIS — Z5982 Transportation insecurity: Secondary | ICD-10-CM | POA: Insufficient documentation

## 2022-10-24 DIAGNOSIS — F1910 Other psychoactive substance abuse, uncomplicated: Secondary | ICD-10-CM | POA: Diagnosis not present

## 2022-10-24 DIAGNOSIS — Q231 Congenital insufficiency of aortic valve: Secondary | ICD-10-CM | POA: Diagnosis not present

## 2022-10-24 DIAGNOSIS — F17210 Nicotine dependence, cigarettes, uncomplicated: Secondary | ICD-10-CM | POA: Diagnosis not present

## 2022-10-24 DIAGNOSIS — F1290 Cannabis use, unspecified, uncomplicated: Secondary | ICD-10-CM | POA: Insufficient documentation

## 2022-10-24 DIAGNOSIS — Z79899 Other long term (current) drug therapy: Secondary | ICD-10-CM | POA: Insufficient documentation

## 2022-10-24 DIAGNOSIS — E119 Type 2 diabetes mellitus without complications: Secondary | ICD-10-CM | POA: Diagnosis not present

## 2022-10-24 DIAGNOSIS — R0602 Shortness of breath: Secondary | ICD-10-CM | POA: Insufficient documentation

## 2022-10-24 DIAGNOSIS — I428 Other cardiomyopathies: Secondary | ICD-10-CM | POA: Diagnosis not present

## 2022-10-24 DIAGNOSIS — Z8616 Personal history of COVID-19: Secondary | ICD-10-CM | POA: Diagnosis not present

## 2022-10-24 DIAGNOSIS — F1090 Alcohol use, unspecified, uncomplicated: Secondary | ICD-10-CM | POA: Diagnosis not present

## 2022-10-24 DIAGNOSIS — I110 Hypertensive heart disease with heart failure: Secondary | ICD-10-CM | POA: Diagnosis not present

## 2022-10-24 DIAGNOSIS — I5022 Chronic systolic (congestive) heart failure: Secondary | ICD-10-CM

## 2022-10-24 DIAGNOSIS — Z5941 Food insecurity: Secondary | ICD-10-CM | POA: Diagnosis not present

## 2022-10-24 DIAGNOSIS — I7121 Aneurysm of the ascending aorta, without rupture: Secondary | ICD-10-CM

## 2022-10-24 DIAGNOSIS — F1410 Cocaine abuse, uncomplicated: Secondary | ICD-10-CM | POA: Diagnosis not present

## 2022-10-24 DIAGNOSIS — Z139 Encounter for screening, unspecified: Secondary | ICD-10-CM

## 2022-10-24 LAB — COMPREHENSIVE METABOLIC PANEL
ALT: 20 U/L (ref 0–44)
AST: 26 U/L (ref 15–41)
Albumin: 3.7 g/dL (ref 3.5–5.0)
Alkaline Phosphatase: 55 U/L (ref 38–126)
Anion gap: 12 (ref 5–15)
BUN: 19 mg/dL (ref 6–20)
CO2: 25 mmol/L (ref 22–32)
Calcium: 9.5 mg/dL (ref 8.9–10.3)
Chloride: 105 mmol/L (ref 98–111)
Creatinine, Ser: 1.36 mg/dL — ABNORMAL HIGH (ref 0.61–1.24)
GFR, Estimated: >60 mL/min (ref >60)
Glucose, Bld: 84 mg/dL (ref 70–99)
Potassium: 4.1 mmol/L (ref 3.5–5.1)
Sodium: 142 mmol/L (ref 135–145)
Total Bilirubin: 0.7 mg/dL (ref 0.3–1.2)
Total Protein: 6.4 g/dL — ABNORMAL LOW (ref 6.5–8.1)

## 2022-10-24 LAB — LIPID PANEL
Cholesterol: 153 mg/dL (ref 0–200)
HDL: 59 mg/dL (ref >40)
LDL Cholesterol: 83 mg/dL (ref 0–99)
Total CHOL/HDL Ratio: 2.6 RATIO
Triglycerides: 56 mg/dL (ref <150)
VLDL: 11 mg/dL (ref 0–40)

## 2022-10-24 LAB — CBC
HCT: 38.4 % — ABNORMAL LOW (ref 39.0–52.0)
Hemoglobin: 12.6 g/dL — ABNORMAL LOW (ref 13.0–17.0)
MCH: 31.3 pg (ref 26.0–34.0)
MCHC: 32.8 g/dL (ref 30.0–36.0)
MCV: 95.5 fL (ref 80.0–100.0)
Platelets: 174 10*3/uL (ref 150–400)
RBC: 4.02 MIL/uL — ABNORMAL LOW (ref 4.22–5.81)
RDW: 11.9 % (ref 11.5–15.5)
WBC: 3.3 10*3/uL — ABNORMAL LOW (ref 4.0–10.5)
nRBC: 0 % (ref 0.0–0.2)

## 2022-10-24 LAB — BRAIN NATRIURETIC PEPTIDE: B Natriuretic Peptide: 113.7 pg/mL — ABNORMAL HIGH (ref 0.0–100.0)

## 2022-10-24 MED ORDER — EMPAGLIFLOZIN 10 MG PO TABS
10.0000 mg | ORAL_TABLET | Freq: Every day | ORAL | 11 refills | Status: DC
  Filled 2022-10-24 (×2): qty 30, 30d supply, fill #0
  Filled 2022-11-20: qty 30, 30d supply, fill #1
  Filled 2022-12-26: qty 30, 30d supply, fill #2
  Filled 2023-01-29: qty 30, 30d supply, fill #3
  Filled 2023-03-09: qty 30, 30d supply, fill #4
  Filled 2023-04-20: qty 30, 30d supply, fill #5
  Filled 2023-06-01: qty 30, 30d supply, fill #6

## 2022-10-24 MED ORDER — POTASSIUM CHLORIDE CRYS ER 20 MEQ PO TBCR
20.0000 meq | EXTENDED_RELEASE_TABLET | Freq: Every day | ORAL | 3 refills | Status: DC
  Filled 2022-10-24 (×2): qty 90, 90d supply, fill #0
  Filled 2023-01-29 – 2023-03-09 (×2): qty 90, 90d supply, fill #1
  Filled 2023-04-20: qty 90, 90d supply, fill #2

## 2022-10-24 MED ORDER — METOPROLOL SUCCINATE ER 25 MG PO TB24
25.0000 mg | ORAL_TABLET | Freq: Every day | ORAL | 0 refills | Status: DC
  Filled 2022-10-24 – 2022-11-20 (×3): qty 30, 30d supply, fill #0

## 2022-10-24 MED ORDER — ENTRESTO 24-26 MG PO TABS
1.0000 | ORAL_TABLET | Freq: Two times a day (BID) | ORAL | 11 refills | Status: DC
  Filled 2022-10-24 (×2): qty 60, 30d supply, fill #0
  Filled 2022-11-20: qty 60, 30d supply, fill #1
  Filled 2022-12-26: qty 60, 30d supply, fill #2
  Filled 2023-01-29 (×2): qty 60, 30d supply, fill #3
  Filled 2023-03-09: qty 60, 30d supply, fill #4
  Filled 2023-04-20: qty 60, 30d supply, fill #5
  Filled 2023-06-01: qty 60, 30d supply, fill #6

## 2022-10-24 MED ORDER — SPIRONOLACTONE 25 MG PO TABS
25.0000 mg | ORAL_TABLET | Freq: Every day | ORAL | 0 refills | Status: DC
  Filled 2022-10-24 – 2022-11-20 (×3): qty 30, 30d supply, fill #0

## 2022-10-24 MED ORDER — FUROSEMIDE 20 MG PO TABS
40.0000 mg | ORAL_TABLET | Freq: Every day | ORAL | 0 refills | Status: DC
  Filled 2022-10-24 (×2): qty 30, 15d supply, fill #0
  Filled 2022-10-24: qty 180, 90d supply, fill #0

## 2022-10-24 NOTE — Patient Instructions (Addendum)
Medication Changes:  Increase LASIX to 40 mg (2 tablets) daily   START taking potassium 20 mEq (1 tablet) daily.   *If you need a refill on your cardiac medications before your next appointment, please call your pharmacy*  Lab Work:  Labs done today, your results will be available in MyChart, we will contact you for abnormal readings.  Testing/Procedures:  Your physician has requested that you have an echocardiogram. Echocardiography is a painless test that uses sound waves to create images of your heart. It provides your doctor with information about the size and shape of your heart and how well your heart's chambers and valves are working. This procedure takes approximately one hour. There are no restrictions for this procedure. Please do NOT wear cologne, perfume, aftershave, or lotions (deodorant is allowed). Please arrive 15 minutes prior to your appointment time.   Referrals:  You have been referred to cardiac thoracic surgery with Dr. Cliffton Asters. They will call you to schedule an appointment.   Follow-Up in:   Your physician recommends that you schedule a follow-up appointment in: Dr. Gala Romney (January 14, 2023)    Do the following things EVERYDAY: Weigh yourself in the morning before breakfast. Write it down and keep it in a log. Take your medicines as prescribed Eat low salt foods--Limit salt (sodium) to 2000 mg per day.  Stay as active as you can everyday Limit all fluids for the day to less than 2 liters    Need to Contact us:  If you have any questions or concerns before your next appointment please send [place] a message through [place] or call our office at [phone].    TO LEAVE A MESSAGE FOR THE NURSE SELECT OPTION 2, PLEASE LEAVE A MESSAGE INCLUDING: YOUR NAME DATE OF BIRTH CALL BACK NUMBER REASON FOR CALL**this is important as we prioritize the call backs  YOU WILL RECEIVE A CALL BACK THE SAME DAY AS LONG AS YOU CALL BEFORE 4:00 PM   At the [facility], you and your health needs are our priority. As part of our continuing mission to provide you with exceptional heart care, we have created designated Provider Care Teams. These Care Teams include your primary Cardiologist (physician) and Advanced Practice Providers (APPs- Physician Assistants and Nurse Practitioners) who all work together to provide you with the care you need, when you need it.   You may see any of the following providers on your designated Care Team at your next follow up: Dr Arvilla Meres Dr Marca Ancona Dr. Marcos Eke, NP Robbie Lis, [place] [facility] [place], [place] Brynda Peon, NP Karle Plumber, PharmD   Please be sure to bring in all your medications bottles to every appointment.    Thank you for choosing [facility]

## 2022-10-24 NOTE — Progress Notes (Signed)
ReDS Vest / Clip - 10/24/22 0800       ReDS Vest / Clip   Station Marker C    Ruler Value 32.5    ReDS Value Range Moderate volume overload    ReDS Actual Value 38

## 2022-11-03 ENCOUNTER — Other Ambulatory Visit (HOSPITAL_COMMUNITY): Payer: Medicaid Other

## 2022-11-05 ENCOUNTER — Telehealth (HOSPITAL_COMMUNITY): Payer: Self-pay | Admitting: Licensed Clinical Social Worker

## 2022-11-05 NOTE — Telephone Encounter (Signed)
CSW attempted to call pt to discuss SDOH concerns- phone number listed for pt in chart went to a "friend of a friend" who provided CSW with new number [phone]- CSW attempted to reach pt at number and left VM requesting return call  Burna Sis, LCSW Clinical Social Worker [facility] Desk#: [phone] Cell#: [phone]

## 2022-11-05 NOTE — Telephone Encounter (Signed)
CSW attempted to contact pt regarding SDOH concerns- unable to reach- left VM requesting return call  Burna Sis, LCSW Clinical Social Worker [facility] Desk#: [phone] Cell#: [phone]

## 2022-11-19 ENCOUNTER — Telehealth (HOSPITAL_COMMUNITY): Payer: Self-pay | Admitting: Licensed Clinical Social Worker

## 2022-11-19 NOTE — Telephone Encounter (Signed)
CSW attempted to call pt to check in regarding SDOH concerns- unable to reach and unable to leave VM.  Burna Sis, LCSW Clinical Social Worker [facility] Desk#: [phone] Cell#: [phone]

## 2022-11-20 ENCOUNTER — Other Ambulatory Visit (HOSPITAL_COMMUNITY): Payer: Self-pay

## 2022-11-28 ENCOUNTER — Other Ambulatory Visit
Admission: RE | Admit: 2022-11-28 | Discharge: 2022-11-28 | Disposition: A | Discharge status: Home / Self Care | Payer: Medicaid Other | Attending: Family Medicine | Admitting: Family Medicine

## 2022-11-28 ENCOUNTER — Telehealth: Payer: Self-pay

## 2022-11-28 DIAGNOSIS — I5022 Chronic systolic (congestive) heart failure: Secondary | ICD-10-CM | POA: Diagnosis present

## 2022-11-28 LAB — BASIC METABOLIC PANEL
Anion gap: 8 (ref 5–15)
BUN: 21 mg/dL — ABNORMAL HIGH (ref 6–20)
CO2: 24 mmol/L (ref 22–32)
Calcium: 9.3 mg/dL (ref 8.9–10.3)
Chloride: 111 mmol/L (ref 98–111)
Creatinine, Ser: 1.2 mg/dL (ref 0.61–1.24)
GFR, Estimated: >60 mL/min (ref >60)
Glucose, Bld: 106 mg/dL — ABNORMAL HIGH (ref 70–99)
Potassium: 4 mmol/L (ref 3.5–5.1)
Sodium: 143 mmol/L (ref 135–145)

## 2022-12-03 ENCOUNTER — Encounter: Payer: Medicaid Other | Admitting: Cardiology

## 2022-12-03 NOTE — Progress Notes (Deleted)
ADVANCED HF CLINIC CONSULT NOTE   Primary Care: pending Primary Cardiologist: Bryan Lemma, MD HF Cardiologist: Dr. Gala Romney  HPI: 52 y.o.. male with history of chronic systolic CHF/NICM, bicuspid aortic valve with severe AI, polysubstance abuse (cocaine, ETOH, tobacco, marijuana), DM II.    Echo 11/2017: EF 60-65%, bicuspid aortic valve with mild to moderate AI   Echo 5/23: EF 40-45%, LV moderate to severely dilated, mild LVH, mild to moderate AI, bright speckled myocardium raising concern for infiltrative process, mildly reduced RV   cMRI 11/08/21: Severely dilated LV with EF 31%, severely dilated RV with moderately reduced function (RVEF 30%), nonspecific RV insertion LGE which can be seen in setting of elevated pulmonary pressures, bicuspid aortic valve with severe AI, dilated ascending aorta measuring 42 mm   Admitted 8/23 with a/c CHF. BNP > 3,000. Had been eating out and consuming heavy alcohol prior to admission. R/LHC: No CAD, RA 6, PA 49/24 (33), PCWP 26, LVEDP 41, Fick CO/CI 4.03/2.08. He was diuresed with IV lasix. GDMT optimized. Case reviewed with TCTS and was scheduled for outpatient visit for consideration of AVR.    Initial post hospital TOC visit 8/23, NYHA II and volume overloaded. Referred to AHF with plans to follow until potential AVR, at which time could potentially graduate AHF clinic if LV function improved. Follow up 8/23 with Dr. Cliffton Asters, planned for mechanical AV replacement, needed dental clearance.   Unfortunately lost to follow up for close to a year. Followed up in [facility] clinic where he reported to continue using cocaine weekly, ETOH on weekends and THC on weekends. Wife works at [employer].  Drinks a lot of Dr. Reino Kent & > 2 L fluid/day. He lives in a boarding room in [place], planning on moving in with his mom, who lives in [place] soon.   Cardiac Studies - R/LHC (8/23): No CAD; RA 6, PA 49/24 (33), PCWP 26, LVEDP 41, Fick CO/CI 4.03/2.08.  - cMRI (7/23): LVEF  31%, severely dilated RV, RVEF 30%, nonspecific RV insertion LGE which can be seen in setting of elevated pulmonary pressures, bicuspid aortic valve with severe AI, dilated ascending aorta measuring 42 mm  - Echo (5/23): EF 40-45%, LV moderate to severely dilated, mild LVH, mild to moderate AI, bright speckled myocardium raising concern for infiltrative process, mildly reduced RV  - Echo (8/19): EF 60-65%, bicuspid aortic valve with mild to moderate AI  Past Medical History:  Diagnosis Date   Aortic insufficiency due to bicuspid aortic valve 10/10/2021   Cardiac MRI 05/11/2021: Bicuspid aortic valve with fusion of the R & [place] cusps with severe AI (regurgitant fraction of 59%.   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  Severe AI 2/2 Bicusid AoV ([place] cusp fusion). Ascending Ao 42 mm   Valvular heart disease    a.) TTE on 12/19/2017 --> mild MV regurgitation; moderate AV regurgitation   Current Outpatient Medications  Medication Sig  Dispense Refill   albuterol (PROVENTIL HFA) 108 (90 Base) MCG/ACT inhaler Inhale 2 puffs into the lungs once every 6 (six) hours as needed for wheezing or shortness of breath. 18 g 3   empagliflozin (JARDIANCE) 10 MG TABS tablet Take 1 tablet (10 mg total) by mouth daily before breakfast. 30 tablet 11   feeding supplement (ENSURE ENLIVE / ENSURE PLUS) LIQD Take 237 mLs by mouth 2 (two) times daily between meals. (Patient  taking differently: Take 237 mLs by mouth daily.) 237 mL 12   furosemide (LASIX) 20 MG tablet Take 2 tablets (40 mg total) by mouth daily. 180 tablet 0   metoprolol succinate (TOPROL-XL) 25 MG 24 hr tablet Take 1 tablet (25 mg total) by mouth daily. 30 tablet 0   potassium chloride SA (KLOR-CON M) 20 MEQ tablet Take 1 tablet (20 mEq total) by mouth daily. 90 tablet 3   sacubitril-valsartan (ENTRESTO) 24-26 MG Take 1 tablet by mouth 2 (two) times daily. 60 tablet 11   spironolactone (ALDACTONE) 25 MG tablet Take 1 tablet (25 mg total) by mouth daily. 30 tablet 0   No current facility-administered medications for this visit.   Allergies  Allergen Reactions   Avelox [Moxifloxacin] Other (See Comments)    Thoracic Aneurysm.   Ciprofloxacin Other (See Comments)    Has thoracic Aneurysm   Penicillins Other (See Comments)    Patient states that mother is highly allergic. 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  Has patient had a PCN reaction causing immediate rash, facial/tongue/throat swelling, SOB or lightheadedness with hypotension: No Has patient had a PCN reaction causing severe rash involving mucus membranes or skin necrosis: No Has patient had a PCN reaction that required hospitalization Yes Has patient had a PCN reaction occurring within the last 10 years: No If all of the above answers are "N   Social History   Socioeconomic History   Marital status: Legally Separated    Spouse name: Jasmine December   Number of children: 2   Years of education: Not on file   Highest education level: GED or equivalent  Occupational History   Occupation: disability  Tobacco Use   Smoking status: Some Days    Current packs/day: 0.00    Average packs/day: 0.3 packs/day for 35.0 years (8.8 ttl pk-yrs)    Types: Cigarettes    Start date: 08/1985    Last attempt to quit: 08/2020    Years since quitting: 2.2   Smokeless tobacco: Never   Tobacco comments:    Smokes 4-5 cigarettes per  week.   Vaping Use   Vaping status: Former   Quit date: 05/12/2021   Substances: Nicotine, Flavoring  Substance and Sexual Activity   Alcohol use: Yes    Alcohol/week: 2.0 standard drinks of alcohol    Types: 2 Cans of beer per week    Comment: socially   Drug use: Yes    Types: Marijuana, Other-see comments, "Crack" cocaine    Comment: last use of crack cocaine 01/2021 current use of marijuana   Sexual activity: Yes  Other Topics Concern   Not on file  Social History Narrative      > From cardiology clinic note on October 10, 2021:      He is "doing the best he can "to cut out cigarettes and drugs.  He is down to about 5 cigarettes a week-not buying any, and and only smoking but he can borrow.  However he was with some friends  a couple days ago and was smoking some marijuana that "unbeknownst to him "was laced with "powder ".  He had a little bit of heart fast heart rates and some discomfort in his chest after smoking "blunt".   Social Determinants of Health   Financial Resource Strain: High Risk (12/13/2021)   Overall Financial Resource Strain (CARDIA)    Difficulty of Paying Living Expenses: Very hard  Food Insecurity: Food Insecurity Present (10/24/2022)   Hunger Vital Sign    Worried About Running Out of Food in the Last Year: Often true    Ran Out of Food in the Last Year: Often true  Transportation Needs: No Transportation Needs (10/24/2022)   PRAPARE - Administrator, Civil Service (Medical): No    Lack of Transportation (Non-Medical): No  Recent Concern: Transportation Needs - Unmet Transportation Needs (10/24/2022)   PRAPARE - Transportation    Lack of Transportation (Medical): Yes    Lack of Transportation (Non-Medical): Yes  Physical Activity: Unknown (03/01/2018)   Exercise Vital Sign    Days of Exercise per Week: 7 days    Minutes of Exercise per Session: Patient declined  Stress: Stress Concern Present (03/01/2018)   [employer] of Occupational Health  - Occupational Stress Questionnaire    Feeling of Stress : Very much  Social Connections: Moderately Isolated (03/01/2018)   Social Connection and Isolation Panel [NHANES]    Frequency of Communication with Friends and Family: Never    Frequency of Social Gatherings with Friends and Family: Never    Attends Religious Services: Never    Database administrator or Organizations: No    Attends Banker Meetings: Never    Marital Status: Married  Catering manager Violence: Not At Risk (03/01/2018)   Humiliation, Afraid, Rape, and Kick questionnaire    Fear of Current or Ex-Partner: No    Emotionally Abused: No    Physically Abused: No    Sexually Abused: No   Family History  Problem Relation Age of Onset   Hypertension Mother    Kidney disease Mother    Diabetes Mother    Mitral valve prolapse Mother    Diabetes Father    Hypertension Maternal Grandmother    Mitral valve prolapse Maternal Grandmother    Other Half-Brother        accident   There were no vitals taken for this visit.  Wt Readings from Last 3 Encounters:  10/24/22 152 lb 9.6 oz (69.2 kg)  12/20/21 153 lb (69.4 kg)  12/13/21 153 lb 6.4 oz (69.6 kg)   PHYSICAL EXAM: General:  NAD. No resp difficulty, walked into clinic, thin HEENT: Normal Neck: Supple. JVP 10. Carotids 2+ bilat; no bruits. No lymphadenopathy or thryomegaly appreciated. Cor: PMI nondisplaced. Regular rate & rhythm. No rubs, gallops or murmurs. Lungs: Clear Abdomen: Soft, nontender, nondistended. No hepatosplenomegaly. No bruits or masses. Good bowel sounds. Extremities: No cyanosis, clubbing, rash, edema Neuro: Alert & oriented x 3, cranial nerves grossly intact. Moves all 4 extremities w/o difficulty. Affect pleasant.  ECG: (personally reviewed): NSR 61 bpm  ReDs: 38%  ASSESSMENT & PLAN: Chronic systolic CHF/NICM - Echo (8/19): EF 60-65%, mild to moderate AI - Echo (5/23): EF 40-45%, LV moderate to severely dilated, mild LVH, mild  to moderate AI, bright speckled myocardium raising concern for infiltrative process, mildly reduced RV - cMRI (11/08/21): Severely dilated LVEF 31%, severely dilated RV with moderately reduced function (RVEF 30%), nonspecific RV insertion LGE which can be seen in  setting of elevated pulmonary pressures, bicuspid aortic valve with severe AI, dilated ascending aorta measuring 42 mm - R/LHC (8/23): No CAD, RA 6, PA 49/24 (33), PCWP 26, LVEDP 41, Fick CO/CI 4.03/2.08 - Etiology cardiomyopathy likely valvular disease +/- component of cocaine and ETOH abuse. Has upcoming appointment with TCTS for consideration of AVR - NYHA II. Volume mildly elevated today. ReDS 38%. GDMT titration limited by soft BP. - Increase Lasix to 40 mg daily, add 20 KCL daily. - Continue Toprol XL 25 mg daily. - Continue Entresto 24/26 mg bid. - Continue spiro 25 mg daily. - Continue Jardiance 10 mg daily. - Needs to refrain from ETOH and cocaine. - Labs today. Repeat BMET in 10 days. - Repeat echo   2. Bicuspid aortic valve/severe aortic insufficiency - Most recent echo as above. - Discussed screening of 1st degree relatives - Has seen Dr. Cliffton Asters (8/23), was planning AVR, pending dental eval but lost to follow up. - Refer back to dentistry (needs and will arrange follow up with CVTS   3. Ascending aortic aneurysm - Measured 4 cm on CT chest 12/22 - Continue beta blocker   4. Polysubstance abuse - Tobacco, ETOH, marijuana and cocaine - Complete cessation discussed.  5. SDOH - Needs PCP, given list today. Will also refer to [facility] if he does move back to [place] with his mother. - Has food insecurity, given bag of food today. - Engage HFSW to help with other resources (substance abuse, transportation).  Follow up in 3 months with Dr. Gala Romney [facility] or [place], whichever patient can get to). Will try to get appt with CVTS to discuss moving forward with AVR.  Prince Rome,  FNP-BC 12/03/22

## 2022-12-03 NOTE — Telephone Encounter (Signed)
  Patient brought disability forms.   Pt was advised that Inetta Fermo or a MD needed to see him.  Clarisa Kindred FNP has not seen pt since June 2023. With 6 no show appts.  Pt requested appt with physician. Appt was made per pt request.  Pt did not show for appt.    Pt has a follow up appt on 01/12/23 with Dr. Gala Romney.

## 2022-12-15 ENCOUNTER — Telehealth: Payer: Self-pay

## 2022-12-15 DIAGNOSIS — I5022 Chronic systolic (congestive) heart failure: Secondary | ICD-10-CM

## 2022-12-15 NOTE — Telephone Encounter (Signed)
Pt called requesting sooner appt so he can have disability paperwork completed. Per Clarisa Kindred, pt will need to have an echocardiogram and an appointment with provider to complete paperwork. Pt verbalized agreement. Echo ordered. Earliest available echo scheduled for Monday, 01/05/2023 at 10 AM.  Pt will keep current appointment with Dr. Gala Romney for 9/16.  Pt aware of both appointment dates. Pt aware to arrive at [facility] for echo at 9:45 AM.  Pt voiced concerns about having no transportation. He states this is why he has missed so many appointments. Pt instructed that our office will ask social worker to call patient to arrange transportation help. Pt aware, agreeable, and verbalized understanding

## 2022-12-17 ENCOUNTER — Telehealth (HOSPITAL_COMMUNITY): Payer: Self-pay | Admitting: Licensed Clinical Social Worker

## 2022-12-17 NOTE — Telephone Encounter (Signed)
H&V Care Navigation CSW Progress Note  Clinical Social Worker consulted to help with transportation to two upcoming appts.  Pt has Managed Medicaid so CSW assisted in calling [facility] Medicaid transport to arrange.  When we attempted to set  up we were informed that his medicaid benefits were stopping on 8/31.  CSW assisted in calling [place] [employer] and spoke with [facility] department.  They report that his case worker will be sending out recertification paperwork soon to pt and that his benefits should continue while this is underway.  CSW explained that we were barred from arranging transport and they report we should speak with his assigned case worker, Ernestina Penna.  Left VM for caseworker [phone]) and sent her an email to inquire about next steps.  Attempted to contact [employer] to set up transport in the meantime but informed since pt lives in public transit range he would have to utilize them first.  Will await reponse from case worker and if they cannot assist with attempt to have pt approved for door to door service with [place] transit.  Patient is participating in a Managed Medicaid Plan:  Yes  SDOH Screenings   Food Insecurity: Food Insecurity Present (10/24/2022)  Housing: Medium Risk (10/24/2022)  Transportation Needs: No Transportation Needs (10/24/2022)  Recent Concern: Transportation Needs - Unmet Transportation Needs (10/24/2022)  Alcohol Screen: Low Risk  (11/21/2020)  Depression (PHQ2-9): Low Risk  (07/12/2021)  Financial Resource Strain: High Risk (12/13/2021)  Physical Activity: Unknown (03/01/2018)  Social Connections: Moderately Isolated (03/01/2018)  Stress: Stress Concern Present (03/01/2018)  Tobacco Use: High Risk (10/24/2022)    Burna Sis, LCSW Clinical Social Worker [facility] Desk#: [phone] Cell#: [phone]

## 2022-12-26 ENCOUNTER — Telehealth (HOSPITAL_COMMUNITY): Payer: Self-pay | Admitting: Licensed Clinical Social Worker

## 2022-12-26 ENCOUNTER — Other Ambulatory Visit (HOSPITAL_COMMUNITY): Payer: Self-pay | Admitting: Family Medicine

## 2022-12-26 ENCOUNTER — Other Ambulatory Visit (HOSPITAL_COMMUNITY): Payer: Self-pay

## 2022-12-26 MED ORDER — SPIRONOLACTONE 25 MG PO TABS
25.0000 mg | ORAL_TABLET | Freq: Every day | ORAL | 3 refills | Status: DC
  Filled 2022-12-26: qty 30, 30d supply, fill #0
  Filled 2023-01-29: qty 30, 30d supply, fill #1
  Filled 2023-03-09: qty 30, 30d supply, fill #2
  Filled 2023-04-20: qty 30, 30d supply, fill #3
  Filled 2023-06-01: qty 15, 15d supply, fill #4

## 2022-12-26 MED ORDER — METOPROLOL SUCCINATE ER 25 MG PO TB24
25.0000 mg | ORAL_TABLET | Freq: Every day | ORAL | 3 refills | Status: DC
  Filled 2022-12-26: qty 30, 30d supply, fill #0
  Filled 2023-01-29: qty 30, 30d supply, fill #1
  Filled 2023-03-09: qty 30, 30d supply, fill #2
  Filled 2023-04-20: qty 30, 30d supply, fill #3
  Filled 2023-06-01: qty 15, 15d supply, fill #4

## 2022-12-26 NOTE — Telephone Encounter (Signed)
H&V Care Navigation CSW Progress Note  Clinical Social Worker called pt to assist with arranging transportation through medicaid to upcoming appts.  9/9 Pick up from home 8:45-9:15am Pick up from appt 11am [place] [id number]  9/16 Pick up from home 8:45-9:15am Pick up from appt 11am Ride ID: [id number]   Patient is participating in a Managed Medicaid Plan:  Yes  SDOH Screenings   Food Insecurity: Food Insecurity Present (10/24/2022)  Housing: Medium Risk (10/24/2022)  Transportation Needs: Unmet Transportation Needs (12/26/2022)  Alcohol Screen: Low Risk  (11/21/2020)  Depression (PHQ2-9): Low Risk  (07/12/2021)  Financial Resource Strain: High Risk (12/13/2021)  Physical Activity: Unknown (03/01/2018)  Social Connections: Moderately Isolated (03/01/2018)  Stress: Stress Concern Present (03/01/2018)  Tobacco Use: High Risk (10/24/2022)    Burna Sis, LCSW Clinical Social Worker [facility] Desk#: [phone] Cell#: [phone]

## 2023-01-05 ENCOUNTER — Other Ambulatory Visit: Payer: Self-pay | Admitting: Cardiology

## 2023-01-05 ENCOUNTER — Ambulatory Visit: Payer: Medicaid Other

## 2023-01-05 ENCOUNTER — Other Ambulatory Visit (HOSPITAL_COMMUNITY): Payer: Self-pay

## 2023-01-05 DIAGNOSIS — R0602 Shortness of breath: Secondary | ICD-10-CM

## 2023-01-06 ENCOUNTER — Other Ambulatory Visit (HOSPITAL_COMMUNITY): Payer: Self-pay

## 2023-01-06 MED ORDER — ALBUTEROL SULFATE HFA 108 (90 BASE) MCG/ACT IN AERS
2.0000 | INHALATION_SPRAY | Freq: Four times a day (QID) | RESPIRATORY_TRACT | 3 refills | Status: DC | PRN
Start: 2023-01-06 — End: 2023-05-16
  Filled 2023-01-06 – 2023-01-19 (×2): qty 18, 30d supply, fill #0

## 2023-01-12 ENCOUNTER — Ambulatory Visit: Payer: Medicaid Other | Attending: Internal Medicine | Admitting: Internal Medicine

## 2023-01-12 ENCOUNTER — Encounter: Payer: Self-pay | Admitting: Internal Medicine

## 2023-01-12 VITALS — BP 108/44 | HR 65 | Resp 14 | Wt 149.1 lb

## 2023-01-12 DIAGNOSIS — I5022 Chronic systolic (congestive) heart failure: Secondary | ICD-10-CM | POA: Diagnosis not present

## 2023-01-12 DIAGNOSIS — I7121 Aneurysm of the ascending aorta, without rupture: Secondary | ICD-10-CM | POA: Diagnosis not present

## 2023-01-12 DIAGNOSIS — Z7984 Long term (current) use of oral hypoglycemic drugs: Secondary | ICD-10-CM | POA: Diagnosis not present

## 2023-01-12 DIAGNOSIS — F1010 Alcohol abuse, uncomplicated: Secondary | ICD-10-CM | POA: Diagnosis not present

## 2023-01-12 DIAGNOSIS — E119 Type 2 diabetes mellitus without complications: Secondary | ICD-10-CM | POA: Insufficient documentation

## 2023-01-12 DIAGNOSIS — F1210 Cannabis abuse, uncomplicated: Secondary | ICD-10-CM | POA: Insufficient documentation

## 2023-01-12 DIAGNOSIS — Z5986 Financial insecurity: Secondary | ICD-10-CM | POA: Diagnosis not present

## 2023-01-12 DIAGNOSIS — Q231 Congenital insufficiency of aortic valve: Secondary | ICD-10-CM | POA: Diagnosis not present

## 2023-01-12 DIAGNOSIS — F17210 Nicotine dependence, cigarettes, uncomplicated: Secondary | ICD-10-CM | POA: Diagnosis not present

## 2023-01-12 DIAGNOSIS — I110 Hypertensive heart disease with heart failure: Secondary | ICD-10-CM | POA: Diagnosis not present

## 2023-01-12 DIAGNOSIS — I428 Other cardiomyopathies: Secondary | ICD-10-CM | POA: Insufficient documentation

## 2023-01-12 DIAGNOSIS — F1410 Cocaine abuse, uncomplicated: Secondary | ICD-10-CM | POA: Diagnosis not present

## 2023-01-12 DIAGNOSIS — Z79899 Other long term (current) drug therapy: Secondary | ICD-10-CM | POA: Insufficient documentation

## 2023-01-12 DIAGNOSIS — F1910 Other psychoactive substance abuse, uncomplicated: Secondary | ICD-10-CM | POA: Diagnosis not present

## 2023-01-12 DIAGNOSIS — R001 Bradycardia, unspecified: Secondary | ICD-10-CM | POA: Diagnosis not present

## 2023-01-12 NOTE — Patient Instructions (Addendum)
Medication Changes:  Please take Entresto 24/26 mg (one tablet) two times a day.   Lab Work:  Labs done today, your results will be available in MyChart, we will contact you for abnormal readings.   Testing/Procedures:  Your physician has requested that you have an echocardiogram. Echocardiography is a painless test that uses sound waves to create images of your heart. It provides your doctor with information about the size and shape of your heart and how well your heart's chambers and valves are working. This procedure takes approximately one hour. There are no restrictions for this procedure. Please do NOT wear cologne, perfume, aftershave, or lotions (deodorant is allowed). Please arrive 15 minutes prior to your appointment time. Please call this number if we haven't call you with a date and time.  [phone]  Please clear your voicemail.    Special Instructions // Education:  Do the following things EVERYDAY: Weigh yourself in the morning before breakfast. Write it down and keep it in a log. Take your medicines as prescribed Eat low salt foods--Limit salt (sodium) to 2000 mg per day.  Stay as active as you can everyday Limit all fluids for the day to less than 2 liters   Follow-Up in: Please call in December to schedule your January appointment.    If you have any questions or concerns before your next appointment please send [place] a message through [place] or call our office at [phone] Monday-Friday 8 am-5 pm.   If you have an urgent need after hours on the weekend please call your Primary Cardiologist or the [facility] in [place] at [phone].

## 2023-01-12 NOTE — Progress Notes (Signed)
ADVANCED HF CLINIC CONSULT NOTE   Primary Care: pending Primary Cardiologist: Bryan Lemma, MD   HPI: 52 y.o.. male with history of chronic systolic CHF/NICM, bicuspid aortic valve with severe AI, polysubstance abuse (cocaine, ETOH, tobacco, marijuana), DM II. Referred by [facility] for further evalaution of his HF    Echo 11/2017: EF 60-65%, bicuspid aortic valve with mild to moderate AI   Echo 5/23: EF 40-45%, LV moderate to severely dilated, mild LVH, mild to moderate AI, bright speckled myocardium raising concern for infiltrative process, mildly reduced RV   cMRI 11/08/21: Severely dilated LV with EF 31%, severely dilated RV with moderately reduced function (RVEF 30%), nonspecific RV insertion LGE which can be seen in setting of elevated pulmonary pressures, bicuspid aortic valve with severe AI, dilated ascending aorta measuring 42 mm   Admitted 8/23 with a/c CHF. BNP > 3,000. Had been eating out and consuming heavy alcohol prior to admission. R/LHC: No CAD, RA 6, PA 49/24 (33), PCWP 26, LVEDP 41, Fick CO/CI 4.03/2.08. He was diuresed with IV lasix. GDMT optimized. Case reviewed with TCTS and was scheduled for outpatient visit for consideration of AVR.    Initial post hospital TOC visit 8/23, NYHA II and volume overloaded. Referred to AHF with plans to follow until potential AVR, at which time could potentially graduate AHF clinic if LV function improved.   Unfortunately lost to follow up.  Recently seen in [facility] again and was doing OK. Saw Dr. Cliffton Asters in 8/23 who told him he would need dental clearance first. Has had trouble getting to the dentist and is frustrated about it. Denies CP or SOB. No edema. Still smoking a few cigarettes. Still using ETOH and cocaine on weekends. Says he is going to rehab next month. He lives in a boarding room in [place], planning on moving in with his mom, who lives in [place] soon. Compliant with meds but taking Entresto only once a day.    Cardiac  Studies - R/LHC (8/23): No CAD; RA 6, PA 49/24 (33), PCWP 26, LVEDP 41, Fick CO/CI 4.03/2.08.  - cMRI (7/23): LVEF 31%, severely dilated RV, RVEF 30%, nonspecific RV insertion LGE which can be seen in setting of elevated pulmonary pressures, bicuspid aortic valve with severe AI, dilated ascending aorta measuring 42 mm  - Echo (5/23): EF 40-45%, LV moderate to severely dilated, mild LVH, mild to moderate AI, bright speckled myocardium raising concern for infiltrative process, mildly reduced RV  - Echo (8/19): EF 60-65%, bicuspid aortic valve with mild to moderate AI  Review of Systems: [y] = yes, [ ]  = no   General: Weight gain [ ] ; Weight loss [ ] ; Anorexia [ ] ; Fatigue [ ] ; Fever [ ] ; Chills [ ] ; Weakness [ ]   Cardiac: Chest pain/pressure [ ] ; Resting SOB [ ] ; Exertional SOB Cove.Etienne ]; Orthopnea [ ] ; Pedal Edema [ ] ; Palpitations [ ] ; Syncope [ ] ; Presyncope [ ] ; Paroxysmal nocturnal dyspnea[ ]   Pulmonary: Cough [ ] ; Wheezing[ ] ; Hemoptysis[ ] ; Sputum [ ] ; Snoring [ ]   GI: Vomiting[ ] ; Dysphagia[ ] ; Melena[ ] ; Hematochezia [ ] ; Heartburn[ ] ; Abdominal pain [ ] ; Constipation [ ] ; Diarrhea [ ] ; BRBPR [ ]   GU: Hematuria[ ] ; Dysuria [ ] ; Nocturia[ ]   Vascular: Pain in legs with walking [ ] ; Pain in feet with lying flat [ ] ; Non-healing sores [ ] ; Stroke [ ] ; TIA [ ] ; Slurred speech [ ] ;  Neuro: Headaches[ ] ; Vertigo[ ] ; Seizures[ ] ; Paresthesias[ ] ;Blurred vision [ ] ;  Diplopia [ ] ; Vision changes [ ]   Ortho/Skin: Arthritis [ ] ; Joint pain [ ] ; Muscle pain [ ] ; Joint swelling [ ] ; Back Pain [ ] ; Rash [ ]   Psych: Depression[ ] ; Anxiety[y ]  Heme: Bleeding problems [ ] ; Clotting disorders [ ] ; Anemia [ ]   Endocrine: Diabetes Cove.Etienne ]; Thyroid dysfunction[ ]   Past Medical History:  Diagnosis Date   Aortic insufficiency due to bicuspid aortic valve 10/10/2021   Cardiac MRI 05/11/2021: Bicuspid aortic valve with fusion of the [place] cusps with severe AI (regurgitant fraction of 59%.   Chronic HFpEF    a.)  TTE on 12/19/2017 --> LVEF 60-65%, no RWMAs, mild LA dilitation, PASP 44 mmHg   COPD (chronic obstructive pulmonary disease) (HCC)    Glaucoma    History of 2019 novel coronavirus disease (COVID-19) 09/15/2020   Hypertension    Nephrolithiasis    Polysubstance abuse (HCC)    ETOH, marijuana, cocaine   Seizures (HCC)    T2DM (type 2 diabetes mellitus) (HCC)    a.)  Controlled with diet lifestyle modifications   Valvular cardiomyopathy (HCC) 12/19/2017   Cardiac MRI 11/08/2021: Severe biventricular dilation with moderate to severe dysfunction: LVEF ~31, RVEF ~30%.  Severe AI 2/2 Bicusid AoV (R&[place] cusp fusion). Ascending Ao 42 mm   Valvular heart disease    a.) TTE on 12/19/2017 --> mild MV regurgitation; moderate AV regurgitation   Current Outpatient Medications  Medication Sig Dispense Refill   albuterol (VENTOLIN HFA) 108 (90 Base) MCG/ACT inhaler Inhale 2 puffs into the lungs once every 6 (six) hours as needed for wheezing or shortness of breath. 18 g 3   empagliflozin (JARDIANCE) 10 MG TABS tablet Take 1 tablet (10 mg total) by mouth daily before breakfast. 30 tablet 11   feeding supplement (ENSURE ENLIVE / ENSURE PLUS) LIQD Take 237 mLs by mouth 2 (two) times daily between meals. (Patient taking differently: Take 237 mLs by mouth daily.) 237 mL 12   furosemide (LASIX) 20 MG tablet Take 2 tablets (40 mg total) by mouth daily. 180 tablet 0   metoprolol succinate (TOPROL-XL) 25 MG 24 hr tablet Take 1 tablet (25 mg total) by mouth daily. 30 tablet 3   potassium chloride SA (KLOR-CON M) 20 MEQ tablet Take 1 tablet (20 mEq total) by mouth daily. 90 tablet 3   sacubitril-valsartan (ENTRESTO) 24-26 MG Take 1 tablet by mouth 2 (two) times daily. 60 tablet 11   spironolactone (ALDACTONE) 25 MG tablet Take 1 tablet (25 mg total) by mouth daily. 30 tablet 3   No current facility-administered medications for this visit.   Allergies  Allergen Reactions   Avelox [Moxifloxacin] Other (See Comments)     Thoracic Aneurysm.   Ciprofloxacin Other (See Comments)    Has thoracic Aneurysm   Penicillins Other (See Comments)    Patient states that mother is highly allergic. 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  Has patient had a PCN reaction causing immediate rash, facial/tongue/throat swelling, SOB or lightheadedness with hypotension: No Has patient had a PCN reaction causing severe rash involving mucus membranes or skin necrosis: No Has patient had a PCN reaction that required hospitalization Yes Has patient had a PCN reaction occurring within the last 10 years: No If all of the above answers are "N   Social History   Socioeconomic History   Marital status: Legally Separated    Spouse name: Jasmine December   Number of children: 2   Years of  education: Not on file   Highest education level: GED or equivalent  Occupational History   Occupation: disability  Tobacco Use   Smoking status: Some Days    Current packs/day: 0.00    Average packs/day: 0.3 packs/day for 35.0 years (8.8 ttl pk-yrs)    Types: Cigarettes    Start date: 08/1985    Last attempt to quit: 08/2020    Years since quitting: 2.3   Smokeless tobacco: Never   Tobacco comments:    Smokes 4-5 cigarettes per week.   Vaping Use   Vaping status: Former   Quit date: 05/12/2021   Substances: Nicotine, Flavoring  Substance and Sexual Activity   Alcohol use: Yes    Alcohol/week: 2.0 standard drinks of alcohol    Types: 2 Cans of beer per week    Comment: socially   Drug use: Yes    Types: Marijuana, Other-see comments, "Crack" cocaine    Comment: last use of crack cocaine 01/2021 current use of marijuana   Sexual activity: Yes  Other Topics Concern   Not on file  Social History Narrative      > From cardiology clinic note on October 10, 2021:      He is "doing the best he can "to cut out cigarettes and drugs.  He is down to about 5 cigarettes a week-not buying any, and and only smoking but he can borrow.   However he was with some friends a couple days ago and was smoking some marijuana that "unbeknownst to him "was laced with "powder ".  He had a little bit of heart fast heart rates and some discomfort in his chest after smoking "blunt".   Social Determinants of Health   Financial Resource Strain: High Risk (12/13/2021)   Overall Financial Resource Strain (CARDIA)    Difficulty of Paying Living Expenses: Very hard  Food Insecurity: Food Insecurity Present (10/24/2022)   Hunger Vital Sign    Worried About Running Out of Food in the Last Year: Often true    Ran Out of Food in the Last Year: Often true  Transportation Needs: Unmet Transportation Needs (12/26/2022)   PRAPARE - Administrator, Civil Service (Medical): Yes    Lack of Transportation (Non-Medical): No  Physical Activity: Unknown (03/01/2018)   Exercise Vital Sign    Days of Exercise per Week: 7 days    Minutes of Exercise per Session: Patient declined  Stress: Stress Concern Present (03/01/2018)   [employer] of Occupational Health - Occupational Stress Questionnaire    Feeling of Stress : Very much  Social Connections: Moderately Isolated (03/01/2018)   Social Connection and Isolation Panel [NHANES]    Frequency of Communication with Friends and Family: Never    Frequency of Social Gatherings with Friends and Family: Never    Attends Religious Services: Never    Database administrator or Organizations: No    Attends Banker Meetings: Never    Marital Status: Married  Catering manager Violence: Not At Risk (03/01/2018)   Humiliation, Afraid, Rape, and Kick questionnaire    Fear of Current or Ex-Partner: No    Emotionally Abused: No    Physically Abused: No    Sexually Abused: No   Family History  Problem Relation Age of Onset   Hypertension Mother    Kidney disease Mother    Diabetes Mother    Mitral valve prolapse Mother    Diabetes Father    Hypertension Maternal Grandmother  Mitral  valve prolapse Maternal Grandmother    Other Half-Brother        accident   BP (!) 108/44 (BP Location: Right Arm, Patient Position: Sitting, Cuff Size: Normal)   Pulse 65   Resp 14   Wt 149 lb 2 oz (67.6 kg)   SpO2 100%   BMI 19.15 kg/m   Wt Readings from Last 3 Encounters:  01/12/23 149 lb 2 oz (67.6 kg)  10/24/22 152 lb 9.6 oz (69.2 kg)  12/20/21 153 lb (69.4 kg)   PHYSICAL EXAM: General:  Thin male No resp difficulty HEENT: normal Neck: supple. no JVD. Carotids 2+ bilat; no bruits. No lymphadenopathy or thryomegaly appreciated. Cor: PMI nondisplaced. Regular brady. Occasional extra beat. 2/6 AS and AI Lungs: clear Abdomen: soft, nontender, nondistended. No hepatosplenomegaly. No bruits or masses. Good bowel sounds. Extremities: no cyanosis, clubbing, rash, edema Neuro: alert & orientedx3, cranial nerves grossly intact. moves all 4 extremities w/o difficulty. Affect pleasant   ECG: (personally reviewed): NSR 52 +LVH with repol  ASSESSMENT & PLAN: Chronic systolic CHF/NICM - Echo (8/19): EF 60-65%, mild to moderate AI - Echo (5/23): EF 40-45%, LV moderate to severely dilated, mild LVH, mild to moderate AI, bright speckled myocardium raising concern for infiltrative process, mildly reduced RV - cMRI (11/08/21): Severely dilated LVEF 31%, severely dilated RV with moderately reduced function (RVEF 30%), nonspecific RV insertion LGE which can be seen in setting of elevated pulmonary pressures, bicuspid aortic valve with severe AI, dilated ascending aorta measuring 42 mm - R/LHC (8/23): No CAD, RA 6, PA 49/24 (33), PCWP 26, LVEDP 41, Fick CO/CI 4.03/2.08 - Etiology cardiomyopathy likely valvular disease +/- component of cocaine and ETOH abuse. Has upcoming appointment with TCTS for consideration of AVR - Stable NYHA II. Volume status ok  - Taking Lasix 20 mg daily + 20 KCL daily. (At last visit lasix increased to 40 but he has only been taking 20)  - Continue Toprol XL 25 mg  daily. - Increase Entresto 24/26 mg daily to bid - Continue spiro 25 mg daily. - Continue Jardiance 10 mg daily. - Stressed need to refrain from ETOH and cocaine. - Need to repeat ECHO to reassess EF (missed appt for this last week) - Labs today   2. Bicuspid aortic valve/severe aortic insufficiency - Most recent echo as above. - Discussed screening of 1st degree relatives - Has seen Dr. Cliffton Asters (8/23), was planning AVR, pending dental eval but lost to follow up. - Stressed need for f/u with dentist and to abstain from tobacco and polysubstance abuse before he would be candidate for open-heart surgery    3. Ascending aortic aneurysm - Measured 4 cm on CT chest 12/22 - Continue beta blocker   4. Polysubstance abuse - Tobacco, ETOH, marijuana and cocaine - Cessation discussed - Reports that he is going to Rehab soon    Arvilla Meres, MD  10:29 AM  01/12/23

## 2023-01-16 ENCOUNTER — Other Ambulatory Visit (HOSPITAL_COMMUNITY): Payer: Self-pay

## 2023-01-19 ENCOUNTER — Other Ambulatory Visit (HOSPITAL_COMMUNITY): Payer: Self-pay

## 2023-01-29 ENCOUNTER — Other Ambulatory Visit (HOSPITAL_COMMUNITY): Payer: Self-pay

## 2023-01-29 ENCOUNTER — Other Ambulatory Visit (HOSPITAL_COMMUNITY): Payer: Self-pay | Admitting: Family Medicine

## 2023-01-29 MED ORDER — FUROSEMIDE 20 MG PO TABS
40.0000 mg | ORAL_TABLET | Freq: Every day | ORAL | 0 refills | Status: DC
  Filled 2023-01-29: qty 180, 90d supply, fill #0

## 2023-02-02 ENCOUNTER — Ambulatory Visit: Admission: RE | Admit: 2023-02-02 | Payer: Medicaid Other | Source: Ambulatory Visit

## 2023-02-03 ENCOUNTER — Other Ambulatory Visit: Payer: Self-pay

## 2023-02-03 ENCOUNTER — Emergency Department
Admission: EM | Admit: 2023-02-03 | Discharge: 2023-02-03 | Disposition: A | Discharge status: Home / Self Care | Payer: Medicaid Other | Attending: Emergency Medicine | Admitting: Emergency Medicine

## 2023-02-03 DIAGNOSIS — Z77098 Contact with and (suspected) exposure to other hazardous, chiefly nonmedicinal, chemicals: Secondary | ICD-10-CM | POA: Diagnosis not present

## 2023-02-03 DIAGNOSIS — S0502XA Injury of conjunctiva and corneal abrasion without foreign body, left eye, initial encounter: Secondary | ICD-10-CM | POA: Insufficient documentation

## 2023-02-03 DIAGNOSIS — X58XXXA Exposure to other specified factors, initial encounter: Secondary | ICD-10-CM | POA: Diagnosis not present

## 2023-02-03 DIAGNOSIS — R0602 Shortness of breath: Secondary | ICD-10-CM | POA: Diagnosis not present

## 2023-02-03 LAB — CBC
HCT: 39.9 % (ref 39.0–52.0)
Hemoglobin: 13.5 g/dL (ref 13.0–17.0)
MCH: 31.6 pg (ref 26.0–34.0)
MCHC: 33.8 g/dL (ref 30.0–36.0)
MCV: 93.4 fL (ref 80.0–100.0)
Platelets: 231 10*3/uL (ref 150–400)
RBC: 4.27 MIL/uL (ref 4.22–5.81)
RDW: 11.9 % (ref 11.5–15.5)
WBC: 7.9 10*3/uL (ref 4.0–10.5)
nRBC: 0 % (ref 0.0–0.2)

## 2023-02-03 LAB — BASIC METABOLIC PANEL
Anion gap: 9 (ref 5–15)
BUN: 21 mg/dL — ABNORMAL HIGH (ref 6–20)
CO2: 25 mmol/L (ref 22–32)
Calcium: 9.3 mg/dL (ref 8.9–10.3)
Chloride: 106 mmol/L (ref 98–111)
Creatinine, Ser: 1.27 mg/dL — ABNORMAL HIGH (ref 0.61–1.24)
GFR, Estimated: >60 mL/min (ref >60)
Glucose, Bld: 99 mg/dL (ref 70–99)
Potassium: 3.8 mmol/L (ref 3.5–5.1)
Sodium: 140 mmol/L (ref 135–145)

## 2023-02-03 MED ORDER — FLUORESCEIN SODIUM 1 MG OP STRP
1.0000 | ORAL_STRIP | Freq: Once | OPHTHALMIC | Status: AC
  Administered 2023-02-03: 1 via OPHTHALMIC
  Filled 2023-02-03: qty 1

## 2023-02-03 MED ORDER — ERYTHROMYCIN 5 MG/GM OP OINT
1.0000 | TOPICAL_OINTMENT | Freq: Four times a day (QID) | OPHTHALMIC | 0 refills | Status: DC
  Filled 2023-02-03: qty 3.5, 7d supply, fill #0

## 2023-02-03 MED ORDER — ALBUTEROL SULFATE (2.5 MG/3ML) 0.083% IN NEBU
2.5000 mg | INHALATION_SOLUTION | Freq: Once | RESPIRATORY_TRACT | Status: AC
  Administered 2023-02-03: 2.5 mg via RESPIRATORY_TRACT
  Filled 2023-02-03: qty 3

## 2023-02-03 MED ORDER — SODIUM CHLORIDE 0.9 % IV BOLUS
500.0000 mL | Freq: Once | INTRAVENOUS | Status: AC
  Administered 2023-02-03: 500 mL via INTRAVENOUS

## 2023-02-03 MED ORDER — TETRACAINE HCL 0.5 % OP SOLN
1.0000 [drp] | Freq: Once | OPHTHALMIC | Status: AC
  Administered 2023-02-03: 1 [drp] via OPHTHALMIC
  Filled 2023-02-03: qty 4
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
## 2023-02-03 NOTE — Discharge Instructions (Addendum)
You are seen in the emergency department for an exposure of chemicals to your eye.  Your eye was irrigated in the emergency department.  You were given a prescription for erythromycin ointment to apply 4 times a day.  Follow-up closely with an eye doctor, you were given the number on your discharge paperwork.  Return to the emergency department for any worsening symptoms.  You are also given information to establish care and follow-up with her primary care doctor.  Thank you for choosing [place] for your health care, it was my pleasure to care for you today!  Corena Herter, MD
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
## 2023-02-05 ENCOUNTER — Encounter: Payer: Medicaid Other | Admitting: Internal Medicine

## 2023-02-05 DIAGNOSIS — H10213 Acute toxic conjunctivitis, bilateral: Secondary | ICD-10-CM | POA: Diagnosis not present

## 2023-02-05 DIAGNOSIS — S0501XA Injury of conjunctiva and corneal abrasion without foreign body, right eye, initial encounter: Secondary | ICD-10-CM | POA: Diagnosis not present

## 2023-02-06 ENCOUNTER — Other Ambulatory Visit: Payer: Self-pay

## 2023-02-06 DIAGNOSIS — H10213 Acute toxic conjunctivitis, bilateral: Secondary | ICD-10-CM | POA: Diagnosis not present

## 2023-02-06 DIAGNOSIS — S0501XD Injury of conjunctiva and corneal abrasion without foreign body, right eye, subsequent encounter: Secondary | ICD-10-CM | POA: Diagnosis not present

## 2023-02-06 MED ORDER — ERYTHROMYCIN 5 MG/GM OP OINT
TOPICAL_OINTMENT | OPHTHALMIC | 1 refills | Status: DC
  Filled 2023-02-06: qty 3.5, 14d supply, fill #0

## 2023-02-09 ENCOUNTER — Other Ambulatory Visit: Payer: Self-pay

## 2023-02-09 DIAGNOSIS — H168 Other keratitis: Secondary | ICD-10-CM | POA: Diagnosis not present

## 2023-02-09 DIAGNOSIS — H209 Unspecified iridocyclitis: Secondary | ICD-10-CM | POA: Diagnosis not present

## 2023-02-09 DIAGNOSIS — H10213 Acute toxic conjunctivitis, bilateral: Secondary | ICD-10-CM | POA: Diagnosis not present

## 2023-02-09 MED ORDER — PREDNISOLONE ACETATE 1 % OP SUSP
1.0000 [drp] | OPHTHALMIC | 0 refills | Status: DC
  Filled 2023-02-09: qty 10, 16d supply, fill #0
  Filled 2023-02-09: qty 10, 14d supply, fill #0
  Filled 2023-02-09: qty 10, 17d supply, fill #0

## 2023-02-10 DIAGNOSIS — H10213 Acute toxic conjunctivitis, bilateral: Secondary | ICD-10-CM | POA: Diagnosis not present

## 2023-02-10 DIAGNOSIS — H168 Other keratitis: Secondary | ICD-10-CM | POA: Diagnosis not present

## 2023-02-10 DIAGNOSIS — S0501XD Injury of conjunctiva and corneal abrasion without foreign body, right eye, subsequent encounter: Secondary | ICD-10-CM | POA: Diagnosis not present

## 2023-02-12 ENCOUNTER — Other Ambulatory Visit (HOSPITAL_COMMUNITY): Payer: Self-pay

## 2023-02-12 DIAGNOSIS — H168 Other keratitis: Secondary | ICD-10-CM | POA: Diagnosis not present

## 2023-02-12 DIAGNOSIS — H10213 Acute toxic conjunctivitis, bilateral: Secondary | ICD-10-CM | POA: Diagnosis not present

## 2023-02-12 DIAGNOSIS — S0501XD Injury of conjunctiva and corneal abrasion without foreign body, right eye, subsequent encounter: Secondary | ICD-10-CM | POA: Diagnosis not present

## 2023-02-23 DIAGNOSIS — H168 Other keratitis: Secondary | ICD-10-CM | POA: Diagnosis not present

## 2023-02-23 DIAGNOSIS — S0501XD Injury of conjunctiva and corneal abrasion without foreign body, right eye, subsequent encounter: Secondary | ICD-10-CM | POA: Diagnosis not present

## 2023-02-26 ENCOUNTER — Ambulatory Visit: Payer: Medicaid Other

## 2023-03-02 NOTE — Progress Notes (Deleted)
New patient visit   Patient: Darius [name]   DOB: 19-Nov-1970   52 y.o. Male  MRN: [id number] Visit Date: 03/03/2023  Today's healthcare provider: Sherlyn Hay, DO   No chief complaint on file.  Subjective    Darius [name] is a 52 y.o. male who presents today as a new patient to establish care.  HPI        ***  Past Medical History:  Diagnosis Date   Aortic insufficiency due to bicuspid aortic valve 10/10/2021   Cardiac MRI 05/11/2021: Bicuspid aortic valve with fusion of the R & [place] cusps with severe AI (regurgitant fraction of 59%.   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  Severe AI 2/2 Bicusid AoV (R&[place] cusp fusion). Ascending Ao 42 mm   Valvular heart disease    a.) TTE on 12/19/2017 --> mild MV regurgitation; moderate AV regurgitation   Past Surgical History:  Procedure Laterality Date   Cardiac MRI  11/08/2021   Bicuspid aortic valve with fusion of the right and noncoronary cusps-severe AI (regurgitant fraction 59%); Severe biventricular dilation with moderate dysfunction (LVEF estimated 39%, RVEF estimated 30%); ascending aorta measuring 42 mm.   CYSTOSCOPY/URETEROSCOPY/HOLMIUM LASER/STENT PLACEMENT Right 01/29/2021   Procedure: CYSTOSCOPY/URETEROSCOPY/HOLMIUM LASER/STENT PLACEMENT;  Surgeon: Riki Altes, MD;  Location: [facility] ORS;  Service: Urology;  Laterality: Right;   KIDNEY STONE SURGERY     05/2021   NO PAST  SURGERIES     RIGHT/LEFT HEART CATH AND CORONARY ANGIOGRAPHY N/A 12/03/2021   Procedure: RIGHT/LEFT HEART CATH AND CORONARY ANGIOGRAPHY;  Surgeon: Lyn Records, MD;  Location: [facility];  Service: Cardiovascular;  Laterality: N/A;   Family Status  Relation Name Status   Mother  Alive   Father  Deceased   MGM  Deceased   H Brother  Deceased  No partnership data on file   Family History  Problem Relation Age of Onset   Hypertension Mother    Kidney disease Mother    Diabetes Mother    Mitral valve prolapse Mother    Diabetes Father    Hypertension Maternal Grandmother    Mitral valve prolapse Maternal Grandmother    Other Half-Brother        accident   Social History   Socioeconomic History   Marital status: Legally Separated    Spouse name: Jasmine December   Number of children: 2   Years of education: Not on file   Highest education level: GED or equivalent  Occupational History   Occupation: disability  Tobacco Use   Smoking status: Some Days    Current packs/day: 0.00    Average packs/day: 0.3 packs/day for 35.0 years (8.8 ttl pk-yrs)    Types: Cigarettes    Start date: 08/1985    Last attempt to quit: 08/2020    Years since quitting: 2.5   Smokeless tobacco: Never   Tobacco comments:  Smokes 4-5 cigarettes per week.   Vaping Use   Vaping status: Former   Quit date: 05/12/2021   Substances: Nicotine, Flavoring  Substance and Sexual Activity   Alcohol use: Yes    Alcohol/week: 2.0 standard drinks of alcohol    Types: 2 Cans of beer per week    Comment: socially   Drug use: Yes    Types: Marijuana, Other-see comments, "Crack" cocaine    Comment: last use of crack cocaine 01/2021 current use of marijuana   Sexual activity: Yes  Other Topics Concern   Not on file  Social History Narrative      > From cardiology clinic note on October 10, 2021:      He is "doing the best he can "to cut out cigarettes and drugs.  He is down to about 5 cigarettes a week-not  buying any, and and only smoking but he can borrow.  However he was with some friends a couple days ago and was smoking some marijuana that "unbeknownst to him "was laced with "powder ".  He had a little bit of heart fast heart rates and some discomfort in his chest after smoking "blunt".   Social Determinants of Health   Financial Resource Strain: High Risk (12/13/2021)   Overall Financial Resource Strain (CARDIA)    Difficulty of Paying Living Expenses: Very hard  Food Insecurity: Food Insecurity Present (10/24/2022)   Hunger Vital Sign    Worried About Running Out of Food in the Last Year: Often true    Ran Out of Food in the Last Year: Often true  Transportation Needs: Unmet Transportation Needs (12/26/2022)   PRAPARE - Administrator, Civil Service (Medical): Yes    Lack of Transportation (Non-Medical): No  Physical Activity: Unknown (03/01/2018)   Exercise Vital Sign    Days of Exercise per Week: 7 days    Minutes of Exercise per Session: Patient declined  Stress: Stress Concern Present (03/01/2018)   [employer] of Occupational Health - Occupational Stress Questionnaire    Feeling of Stress : Very much  Social Connections: Moderately Isolated (03/01/2018)   Social Connection and Isolation Panel [NHANES]    Frequency of Communication with Friends and Family: Never    Frequency of Social Gatherings with Friends and Family: Never    Attends Religious Services: Never    Database administrator or Organizations: No    Attends Engineer, structural: Never    Marital Status: Married   Outpatient Medications Prior to Visit  Medication Sig   albuterol (VENTOLIN HFA) 108 (90 Base) MCG/ACT inhaler Inhale 2 puffs into the lungs once every 6 (six) hours as needed for wheezing or shortness of breath.   empagliflozin (JARDIANCE) 10 MG TABS tablet Take 1 tablet (10 mg total) by mouth daily before breakfast.   erythromycin ophthalmic ointment Apply 1/4 a small amount into  right eye every two hours while awake   feeding supplement (ENSURE ENLIVE / ENSURE PLUS) LIQD Take 237 mLs by mouth 2 (two) times daily between meals. (Patient taking differently: Take 237 mLs by mouth daily.)   furosemide (LASIX) 20 MG tablet Take 2 tablets (40 mg total) by mouth daily.   metoprolol succinate (TOPROL-XL) 25 MG 24 hr tablet Take 1 tablet (25 mg total) by mouth daily.   potassium chloride SA (KLOR-CON M) 20 MEQ tablet Take 1 tablet (20 mEq total) by mouth daily.   prednisoLONE acetate (PRED FORTE) 1 % ophthalmic suspension Apply 1 drop  into right eye every two hours while awake. Shake bottle before each use.   sacubitril-valsartan (ENTRESTO) 24-26 MG Take 1 tablet by mouth 2 (two) times daily.   spironolactone (ALDACTONE) 25 MG tablet Take 1 tablet (25 mg total) by mouth daily.   No facility-administered medications prior to visit.   Allergies  Allergen Reactions   Avelox [Moxifloxacin] Other (See Comments)    Thoracic Aneurysm.   Ciprofloxacin Other (See Comments)    Has thoracic Aneurysm   Penicillins Other (See Comments)    Patient states that mother is highly allergic. 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  Has patient had a PCN reaction causing immediate rash, facial/tongue/throat swelling, SOB or lightheadedness with hypotension: No Has patient had a PCN reaction causing severe rash involving mucus membranes or skin necrosis: No Has patient had a PCN reaction that required hospitalization Yes Has patient had a PCN reaction occurring within the last 10 years: No If all of the above answers are "N     There is no immunization history on file for this patient.  Health Maintenance  Topic Date Due   COVID-19 Vaccine (1) Never done   FOOT EXAM  Never done   OPHTHALMOLOGY EXAM  Never done   DTaP/Tdap/Td (1 - Tdap) Never done   Zoster Vaccines- Shingrix (1 of 2) Never done   Colonoscopy  Never done   HEMOGLOBIN A1C  03/18/2022   Diabetic kidney  evaluation - Urine ACR  05/29/2022   INFLUENZA VACCINE  Never done   Diabetic kidney evaluation - eGFR measurement  02/03/2024   Hepatitis C Screening  Completed   HIV Screening  Completed   HPV VACCINES  Aged Out    Patient Care Team: Pcp, No as PCP - General Marykay Lex, MD as PCP - Cardiology (Cardiology) Alda Berthold B, DMD (Inactive) (Dentistry)  Review of Systems  {Insert previous labs (optional):23779} {See past labs  Heme  Chem  Endocrine  Serology  Results Review (optional):1}   Objective    There were no vitals taken for this visit. {Insert last BP/Wt (optional):23777}{See vitals history (optional):1}   Physical Exam ***  Depression Screen    07/12/2021    9:55 AM 11/21/2020   12:29 PM 01/28/2018   11:29 AM 01/14/2018   11:12 AM  PHQ 2/9 Scores  PHQ - 2 Score 1 6 2 1   PHQ- 9 Score  7 12    No results found for any visits on 03/03/23.  Assessment & Plan     There are no diagnoses linked to this encounter.   ***  No follow-ups on file.     I discussed the assessment and treatment plan with the patient  The patient was provided an opportunity to ask questions and all were answered. The patient agreed with the plan and demonstrated an understanding of the instructions.   The patient was advised to call back or seek an in-person evaluation if the symptoms worsen or if the condition fails to improve as anticipated.    Sherlyn Hay, DO  [facility] [phone] (phone) [phone] (fax)  [facility]

## 2023-03-03 ENCOUNTER — Ambulatory Visit: Payer: Medicaid Other | Admitting: Family Medicine

## 2023-03-03 DIAGNOSIS — I77810 Thoracic aortic ectasia: Secondary | ICD-10-CM

## 2023-03-03 DIAGNOSIS — J449 Chronic obstructive pulmonary disease, unspecified: Secondary | ICD-10-CM

## 2023-03-03 DIAGNOSIS — F17200 Nicotine dependence, unspecified, uncomplicated: Secondary | ICD-10-CM

## 2023-03-03 DIAGNOSIS — I10 Essential (primary) hypertension: Secondary | ICD-10-CM

## 2023-03-03 DIAGNOSIS — I5032 Chronic diastolic (congestive) heart failure: Secondary | ICD-10-CM

## 2023-03-03 DIAGNOSIS — E440 Moderate protein-calorie malnutrition: Secondary | ICD-10-CM

## 2023-03-03 DIAGNOSIS — Q2381 Bicuspid aortic valve: Secondary | ICD-10-CM

## 2023-03-09 ENCOUNTER — Other Ambulatory Visit (HOSPITAL_COMMUNITY): Payer: Self-pay

## 2023-03-09 DIAGNOSIS — H18891 Other specified disorders of cornea, right eye: Secondary | ICD-10-CM | POA: Diagnosis not present

## 2023-03-09 DIAGNOSIS — H10213 Acute toxic conjunctivitis, bilateral: Secondary | ICD-10-CM | POA: Diagnosis not present

## 2023-04-02 NOTE — Congregational Nurse Program (Signed)
  Dept: [phone]   Congregational Nurse Program Note  Date of Encounter: 04/02/2023 Client to [facility] day center with request for assistance in rescheduling a new patient appointment at [facility]. Apt received for 1/17 at 10 am. Client's phone number is on file for a reminder call. No other needs at this time. Francesco Runner BSN, RN Past Medical History: Past Medical History:  Diagnosis Date   Aortic insufficiency due to bicuspid aortic valve 10/10/2021   Cardiac MRI 05/11/2021: Bicuspid aortic valve with fusion of the R & [place] cusps with severe AI (regurgitant fraction of 59%.   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  Severe AI 2/2 Bicusid AoV (R&[place] cusp fusion). Ascending Ao 42 mm   Valvular heart disease    a.) TTE on 12/19/2017 --> mild MV regurgitation; moderate AV regurgitation    Encounter Details:  Community Questionnaire - 04/02/23 1219       Questionnaire   Ask client: Do you give verbal consent for me to treat you today? Yes    Student Assistance N/A    Location Patient Served  [facility]    Encounter Setting CN site    Population Status Unknown   client lives in a boarding room   Insurance Medicaid   [employer]   Insurance/Financial Assistance Referral N/A    Medication N/A    Medical Provider Yes   [facility], Clarisa Kindred FNP, BFP   Screening Referrals Made N/A    Medical Referrals  Made N/A;[facility] Visit    Medical Appointment Completed N/A    CNP Interventions Advocate/Support;Educate    Screenings CN Performed N/A    ED Visit Averted N/A    Life-Saving Intervention Made N/A

## 2023-04-18 ENCOUNTER — Emergency Department
Admission: EM | Admit: 2023-04-18 | Discharge: 2023-04-18 | Disposition: A | Discharge status: Other Acute Inpatient Hospital | Payer: Medicaid Other | Attending: Emergency Medicine | Admitting: Emergency Medicine

## 2023-04-18 ENCOUNTER — Emergency Department: Payer: Medicaid Other

## 2023-04-18 ENCOUNTER — Encounter: Payer: Self-pay | Admitting: Emergency Medicine

## 2023-04-18 DIAGNOSIS — S36113A Laceration of liver, unspecified degree, initial encounter: Secondary | ICD-10-CM | POA: Insufficient documentation

## 2023-04-18 DIAGNOSIS — N200 Calculus of kidney: Secondary | ICD-10-CM | POA: Diagnosis not present

## 2023-04-18 DIAGNOSIS — Y92009 Unspecified place in unspecified non-institutional (private) residence as the place of occurrence of the external cause: Secondary | ICD-10-CM | POA: Diagnosis not present

## 2023-04-18 DIAGNOSIS — S31109A Unspecified open wound of abdominal wall, unspecified quadrant without penetration into peritoneal cavity, initial encounter: Secondary | ICD-10-CM | POA: Insufficient documentation

## 2023-04-18 DIAGNOSIS — W270XXA Contact with workbench tool, initial encounter: Secondary | ICD-10-CM | POA: Diagnosis not present

## 2023-04-18 DIAGNOSIS — S37031A Laceration of right kidney, unspecified degree, initial encounter: Secondary | ICD-10-CM | POA: Insufficient documentation

## 2023-04-18 DIAGNOSIS — S31139A Puncture wound of abdominal wall without foreign body, unspecified quadrant without penetration into peritoneal cavity, initial encounter: Secondary | ICD-10-CM | POA: Diagnosis not present

## 2023-04-18 DIAGNOSIS — S31119A Laceration without foreign body of abdominal wall, unspecified quadrant without penetration into peritoneal cavity, initial encounter: Secondary | ICD-10-CM

## 2023-04-18 DIAGNOSIS — S36115A Moderate laceration of liver, initial encounter: Secondary | ICD-10-CM | POA: Diagnosis not present

## 2023-04-18 DIAGNOSIS — S37051A Moderate laceration of right kidney, initial encounter: Secondary | ICD-10-CM | POA: Diagnosis not present

## 2023-04-18 DIAGNOSIS — S3993XA Unspecified injury of pelvis, initial encounter: Secondary | ICD-10-CM | POA: Diagnosis not present

## 2023-04-18 DIAGNOSIS — S36116A Major laceration of liver, initial encounter: Secondary | ICD-10-CM | POA: Diagnosis not present

## 2023-04-18 DIAGNOSIS — S3991XA Unspecified injury of abdomen, initial encounter: Secondary | ICD-10-CM | POA: Diagnosis not present

## 2023-04-18 DIAGNOSIS — S299XXA Unspecified injury of thorax, initial encounter: Secondary | ICD-10-CM | POA: Diagnosis not present

## 2023-04-18 LAB — CBC
HCT: 36 % — ABNORMAL LOW (ref 39.0–52.0)
Hemoglobin: 12.5 g/dL — ABNORMAL LOW (ref 13.0–17.0)
MCH: 32.6 pg (ref 26.0–34.0)
MCHC: 34.7 g/dL (ref 30.0–36.0)
MCV: 93.8 fL (ref 80.0–100.0)
Platelets: 230 10*3/uL (ref 150–400)
RBC: 3.84 MIL/uL — ABNORMAL LOW (ref 4.22–5.81)
RDW: 11.9 % (ref 11.5–15.5)
WBC: 9.7 10*3/uL (ref 4.0–10.5)
nRBC: 0 % (ref 0.0–0.2)

## 2023-04-18 LAB — APTT: aPTT: 28 s (ref 24–36)

## 2023-04-18 LAB — COMPREHENSIVE METABOLIC PANEL
ALT: 23 U/L (ref 0–44)
AST: 35 U/L (ref 15–41)
Albumin: 3.8 g/dL (ref 3.5–5.0)
Alkaline Phosphatase: 56 U/L (ref 38–126)
Anion gap: 7 (ref 5–15)
BUN: 22 mg/dL — ABNORMAL HIGH (ref 6–20)
CO2: 28 mmol/L (ref 22–32)
Calcium: 9.5 mg/dL (ref 8.9–10.3)
Chloride: 104 mmol/L (ref 98–111)
Creatinine, Ser: 1.27 mg/dL — ABNORMAL HIGH (ref 0.61–1.24)
GFR, Estimated: >60 mL/min (ref >60)
Glucose, Bld: 98 mg/dL (ref 70–99)
Potassium: 4.1 mmol/L (ref 3.5–5.1)
Sodium: 139 mmol/L (ref 135–145)
Total Bilirubin: 0.9 mg/dL (ref <1.2)
Total Protein: 7 g/dL (ref 6.5–8.1)

## 2023-04-18 LAB — PROTIME-INR
INR: 1 (ref 0.8–1.2)
Prothrombin Time: 13 s (ref 11.4–15.2)

## 2023-04-18 MED ORDER — IOHEXOL 300 MG/ML  SOLN
100.0000 mL | Freq: Once | INTRAMUSCULAR | Status: AC | PRN
  Administered 2023-04-18: 80 mL via INTRAVENOUS

## 2023-04-18 MED ORDER — ONDANSETRON HCL 4 MG/2ML IJ SOLN
4.0000 mg | Freq: Once | INTRAMUSCULAR | Status: AC
  Administered 2023-04-18: 4 mg via INTRAVENOUS
  Filled 2023-04-18: qty 2

## 2023-04-18 MED ORDER — SODIUM CHLORIDE 0.9 % IV BOLUS
500.0000 mL | Freq: Once | INTRAVENOUS | Status: AC
Start: 2023-04-18 — End: 2023-04-18
  Administered 2023-04-18: 500 mL via INTRAVENOUS

## 2023-04-18 MED ORDER — FENTANYL CITRATE PF 50 MCG/ML IJ SOSY
50.0000 ug | PREFILLED_SYRINGE | Freq: Once | INTRAMUSCULAR | Status: AC
  Administered 2023-04-18: 50 ug via INTRAVENOUS
  Filled 2023-04-18: qty 1

## 2023-04-18 MED ORDER — TETANUS-DIPHTH-ACELL PERTUSSIS 5-2.5-18.5 LF-MCG/0.5 IM SUSY: 0.5000 mL | PREFILLED_SYRINGE | Freq: Once | INTRAMUSCULAR | Status: DC

## 2023-04-18 NOTE — ED Notes (Signed)
Consent for transfer signed
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
## 2023-04-18 NOTE — ED Notes (Addendum)
EMTALA and Geophysical data processor Reviewed by this RN
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
## 2023-04-19 DIAGNOSIS — S36115A Moderate laceration of liver, initial encounter: Secondary | ICD-10-CM | POA: Diagnosis not present

## 2023-04-19 DIAGNOSIS — S31119A Laceration without foreign body of abdominal wall, unspecified quadrant without penetration into peritoneal cavity, initial encounter: Secondary | ICD-10-CM | POA: Diagnosis not present

## 2023-04-19 DIAGNOSIS — S37051A Moderate laceration of right kidney, initial encounter: Secondary | ICD-10-CM | POA: Diagnosis not present

## 2023-04-19 DIAGNOSIS — I517 Cardiomegaly: Secondary | ICD-10-CM | POA: Diagnosis not present

## 2023-04-20 ENCOUNTER — Other Ambulatory Visit (HOSPITAL_COMMUNITY): Payer: Self-pay

## 2023-04-20 ENCOUNTER — Other Ambulatory Visit (HOSPITAL_COMMUNITY): Payer: Self-pay | Admitting: Family Medicine

## 2023-04-20 ENCOUNTER — Other Ambulatory Visit: Payer: Self-pay

## 2023-04-20 DIAGNOSIS — S36115A Moderate laceration of liver, initial encounter: Secondary | ICD-10-CM | POA: Diagnosis not present

## 2023-04-20 DIAGNOSIS — S37051A Moderate laceration of right kidney, initial encounter: Secondary | ICD-10-CM | POA: Diagnosis not present

## 2023-04-20 DIAGNOSIS — G8911 Acute pain due to trauma: Secondary | ICD-10-CM | POA: Diagnosis not present

## 2023-04-20 DIAGNOSIS — I5022 Chronic systolic (congestive) heart failure: Secondary | ICD-10-CM | POA: Diagnosis not present

## 2023-04-20 MED ORDER — FUROSEMIDE 20 MG PO TABS
40.0000 mg | ORAL_TABLET | Freq: Every day | ORAL | 0 refills | Status: DC
  Filled 2023-04-20: qty 180, 90d supply, fill #0

## 2023-04-21 ENCOUNTER — Other Ambulatory Visit: Payer: Self-pay

## 2023-04-21 DIAGNOSIS — S31119A Laceration without foreign body of abdominal wall, unspecified quadrant without penetration into peritoneal cavity, initial encounter: Secondary | ICD-10-CM | POA: Diagnosis not present

## 2023-04-21 DIAGNOSIS — S37051A Moderate laceration of right kidney, initial encounter: Secondary | ICD-10-CM | POA: Diagnosis not present

## 2023-04-21 DIAGNOSIS — S36115A Moderate laceration of liver, initial encounter: Secondary | ICD-10-CM | POA: Diagnosis not present

## 2023-04-29 ENCOUNTER — Ambulatory Visit (HOSPITAL_COMMUNITY)
Admission: EM | Admit: 2023-04-29 | Discharge: 2023-04-29 | Disposition: A | Discharge status: Home / Self Care | Payer: Medicaid Other | Attending: Psychiatry | Admitting: Psychiatry

## 2023-04-29 DIAGNOSIS — Q2381 Bicuspid aortic valve: Secondary | ICD-10-CM | POA: Insufficient documentation

## 2023-04-29 DIAGNOSIS — F1490 Cocaine use, unspecified, uncomplicated: Secondary | ICD-10-CM | POA: Insufficient documentation

## 2023-04-29 DIAGNOSIS — F4310 Post-traumatic stress disorder, unspecified: Secondary | ICD-10-CM | POA: Insufficient documentation

## 2023-04-29 DIAGNOSIS — E119 Type 2 diabetes mellitus without complications: Secondary | ICD-10-CM | POA: Insufficient documentation

## 2023-04-29 DIAGNOSIS — Z5900 Homelessness unspecified: Secondary | ICD-10-CM | POA: Insufficient documentation

## 2023-04-29 DIAGNOSIS — I509 Heart failure, unspecified: Secondary | ICD-10-CM | POA: Insufficient documentation

## 2023-04-29 NOTE — ED Notes (Signed)
 Patient walked out by staff, discharged by provider. Belongings returned complete and intact. Patient stable and ambulatory, AVS reviewed and all questions answered. Safety maintained.
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
## 2023-04-29 NOTE — Discharge Instructions (Signed)
 ..  [facility]  ,[place], [place]    Male [address]  Address: [address] [place], [place] [address] Phone#: [phone] [facility] [address] [place], [place] [address] Phone#: [phone] [facility] Address: [address] [place], [place] [address] Phone#: [phone] [facility] Address: [address] [place], [place] [address]  Phone#: [phone] [facility] Address: [address], [place] [address] Phone#: [phone] [facility]  Address: [address] [place], [place] [address]  Phone#: [phone]  [facility] Address: [address] [place]. [place], [place] [address] Phone#: [phone] [facility] Address: [address] [place]. [place], [place] [address] Phone#: [phone] [facility] Address: [address] [place], [place] [address] Phone#: [phone] [facility] Address: [address] [place]. [place], [place] [address] Phone#: [phone] [facility]  Address: [address] [place], [place] [address] Phone#: [phone] [facility] Address: [address] [place], [place] [address] Phone#: [phone]   [facility]  [address] [place], [place] [address] Phone#: [phone] [facility] [place] Address: [address] [place], [place] [address] Phone#: [phone]   : [facility] are peer-driven, democratically run, and self-supported group residences for individuals in recovery from a substance use disorder.  Basic House Rules:  Houses engineer, civil (consulting), electing house officers who serve six-month terms. Houses are financially self-supporting; members split house expenses, which average $110 to $160 per person weekly. Any Resident who has a recurrence of the use of alcohol and/or illicit drugs must be immediately expelled     .SABRA Substance  Abuse Resources     SUBSTANCE USE TREATMENT for Medicaid and State Funded/IPRS  [facility] [address], [place], [address] [phone] phone NOTE: [facility] is no longer offering IOP services.  Serves those who are low-income or have no insurance.  [facility] [address] [place], [place], [address] [phone] phone [phone] fax NOTE: Does have Substance Abuse-Intensive Outpatient Program [facility]) as well as transitional housing if eligible.  [facility] [address] [place], [place], [address] [phone] phone [phone] fax  [facility] [phone] [address] [place], [place], [address] [phone] phone [phone] fax    HALFWAY HOUSES:  [facility] [phone]  [email or web address]     12 STEP PROGRAMS:  Alcoholics Anonymous of [name] softwarechalet.be  Narcotics Anonymous of [place] hitprotect.dk  Al-Anon of [place], [place] www.[place]alanon.org/find-meetings.html  Nar-Anon https://nar-anon.org/find-a-meetin      List of Residential placements:   [facility] in [place]: [phone]  [facility]: [phone]  [place], [place] [phone]: Male and male facility; 30-day program: (uninsured and Medicaid such as [place], [place], [place], partners)  [facility]: [phone]; men and women's facility; 28 days; Can have Medicaid tailored plan Tour Manager or Partners)  [facility]: [phone] [place] or [place]; 28 day program; must be fully detox; tailored Medicaid or no insurance  [address] in [place], [place]; [phone]; 28 day all males program; no insurance accepted  [facility] in [place]: [place] [phone] (no insurance or Medicaid only); 90 days; outpatient services but provide housing in apartments downtown  [place]  RTS Admission: [phone]: Patient must complete phone screening for placement: [place], [place]; 6 month program; uninsured, Medicaid, and Western & southern financial.   [facility]: no insurance required; [phone]  [facility]  [facility]: [phone]; Intake: [place]; Must fill out application online; Steffan Delay [phone] x [address] Mission in [place], [place]: [phone]; Admissions Coordinators Mr. Marinda or Alm Eagles; 90 day program.  [facility]: [place], [place] [phone]; Co-Ed 9 month to a year program; Online application; Men entry fee is $500 (6-75months);  [employer]: [address] [place], [place] [address]; no fee or insurance required; minimum of 2 years; Highly structured; work based; Intake Coordinator is [place] [phone]  [facility] in [place], [place]: [phone]; Fax number is [phone]; website: www.[facility].org; Requires 3-6 page autobiography; 2 year program (18 months and then 73month transitional housing); Admission fee is $300; no insurance needed; work Automotive Engineer in [place], [place]: [employer] Desk Staff: [place] [phone]: They have a Men's Regenerations Program 6-29months. Free program; There is an initial $300 fee however, they are willing to work with patients regarding that. Application is online.  [facility]: Admissions [phone] [place] Free ext [phone]; Any 7-90 day program is out of pocket; 12 month program is free of charge; there is a $275 entry fee; Patient is responsible for own transportation

## 2023-05-04 DIAGNOSIS — H18891 Other specified disorders of cornea, right eye: Secondary | ICD-10-CM | POA: Diagnosis not present

## 2023-05-04 DIAGNOSIS — S0501XD Injury of conjunctiva and corneal abrasion without foreign body, right eye, subsequent encounter: Secondary | ICD-10-CM | POA: Diagnosis not present

## 2023-05-05 ENCOUNTER — Other Ambulatory Visit (HOSPITAL_COMMUNITY): Payer: Self-pay

## 2023-05-14 ENCOUNTER — Other Ambulatory Visit: Payer: Self-pay | Admitting: Licensed Clinical Social Worker

## 2023-05-14 ENCOUNTER — Encounter: Payer: Self-pay | Admitting: Family Medicine

## 2023-05-14 ENCOUNTER — Ambulatory Visit: Payer: Medicaid Other | Admitting: Family Medicine

## 2023-05-14 VITALS — BP 99/52 | HR 68 | Resp 18 | Ht 73.5 in | Wt 163.1 lb

## 2023-05-14 DIAGNOSIS — Z1329 Encounter for screening for other suspected endocrine disorder: Secondary | ICD-10-CM | POA: Diagnosis not present

## 2023-05-14 DIAGNOSIS — Z130 Encounter for screening for diseases of the blood and blood-forming organs and certain disorders involving the immune mechanism: Secondary | ICD-10-CM | POA: Diagnosis not present

## 2023-05-14 DIAGNOSIS — D649 Anemia, unspecified: Secondary | ICD-10-CM | POA: Diagnosis not present

## 2023-05-14 DIAGNOSIS — N182 Chronic kidney disease, stage 2 (mild): Secondary | ICD-10-CM | POA: Diagnosis not present

## 2023-05-14 DIAGNOSIS — Z13228 Encounter for screening for other metabolic disorders: Secondary | ICD-10-CM | POA: Diagnosis not present

## 2023-05-14 DIAGNOSIS — Q2381 Bicuspid aortic valve: Secondary | ICD-10-CM

## 2023-05-14 DIAGNOSIS — Z5900 Homelessness unspecified: Secondary | ICD-10-CM

## 2023-05-14 DIAGNOSIS — F322 Major depressive disorder, single episode, severe without psychotic features: Secondary | ICD-10-CM | POA: Insufficient documentation

## 2023-05-14 DIAGNOSIS — I77810 Thoracic aortic ectasia: Secondary | ICD-10-CM

## 2023-05-14 DIAGNOSIS — I10 Essential (primary) hypertension: Secondary | ICD-10-CM | POA: Diagnosis not present

## 2023-05-14 DIAGNOSIS — F209 Schizophrenia, unspecified: Secondary | ICD-10-CM

## 2023-05-14 DIAGNOSIS — I5032 Chronic diastolic (congestive) heart failure: Secondary | ICD-10-CM

## 2023-05-14 DIAGNOSIS — F1411 Cocaine abuse, in remission: Secondary | ICD-10-CM | POA: Insufficient documentation

## 2023-05-14 DIAGNOSIS — Z87828 Personal history of other (healed) physical injury and trauma: Secondary | ICD-10-CM

## 2023-05-14 DIAGNOSIS — F1410 Cocaine abuse, uncomplicated: Secondary | ICD-10-CM

## 2023-05-14 DIAGNOSIS — Q231 Congenital insufficiency of aortic valve: Secondary | ICD-10-CM

## 2023-05-14 DIAGNOSIS — F419 Anxiety disorder, unspecified: Secondary | ICD-10-CM

## 2023-05-14 DIAGNOSIS — I700 Atherosclerosis of aorta: Secondary | ICD-10-CM | POA: Diagnosis not present

## 2023-05-14 DIAGNOSIS — F1290 Cannabis use, unspecified, uncomplicated: Secondary | ICD-10-CM | POA: Insufficient documentation

## 2023-05-14 DIAGNOSIS — I25119 Atherosclerotic heart disease of native coronary artery with unspecified angina pectoris: Secondary | ICD-10-CM

## 2023-05-14 DIAGNOSIS — F332 Major depressive disorder, recurrent severe without psychotic features: Secondary | ICD-10-CM

## 2023-05-14 DIAGNOSIS — Z7689 Persons encountering health services in other specified circumstances: Secondary | ICD-10-CM

## 2023-05-14 DIAGNOSIS — J439 Emphysema, unspecified: Secondary | ICD-10-CM

## 2023-05-14 NOTE — Patient Instructions (Signed)
Visit Information  Mr. Vaden was given information about Medicaid Managed Care team care coordination services as a part of their [facility] Community Plan Medicaid benefit. Miner Arya verbally consented to engagement with the [facility] Managed Care team.   If you are experiencing a medical emergency, please call 911 or report to your local emergency department or urgent care.   If you have a non-emergency medical problem during routine business hours, please contact your provider's office and ask to speak with a nurse.   For questions related to your [facility], please call: [phone] or visit the homepage here: [email or web address]  If you would like to schedule transportation through your [facility], please call the following number at least 2 days in advance of your appointment: [phone]   Rides for urgent appointments can also be made after hours by calling Member Services.  Call the Behavioral Health Crisis Line at [phone], at any time, 24 hours a day, 7 days a week. If you are in danger or need immediate medical attention call 911.  If you would like help to quit smoking, call 1-800-QUIT-NOW ((361) 501-5157) OR Espaol: 1-855-Djelo-Ya (7-035-009-3818) o para ms informacin haga clic aqu or Text READY to 299-371 to register via text  Following is a copy of your plan of care:  Care Plan : General Social Work (Adult)  Updates made by Gustavus Bryant, LCSW since 05/14/2023 12:00 AM     Problem: Coping Skills (General Plan of Care)      Goal: Coping Skills Enhanced   Note:   Timeframe:  Short Range Goal Priority:  High Start Date:  05/14/23                Expected End Date: ongoing                 Follow Up Date- 06/04/23 at 3 pm   - avoid negative self-talk - be honest with myself - begin personal counseling - check out recovery housing - exercise at  least 2 to 3 times per week - identify a recovery coach (sponsor at [facility]) - identify triggers for relapse - learn how to handle negative feelings without drugs or alcohol - make a list of pleasurable things to do without using alcohol or drugs - make a plan to deal with triggers like holidays and anniversaries - make a plan to fix broken relationships - make a plan to handle bad days - make a written relapse plan - consider attending NA (Narcotics Anonymous) - spend time or talk with a recovery coach or support person at least 2 or 3 times every week - spend time or talk with people who do not drink or use every day    Why is this important?   It is possible that after you stop drinking or using drugs that you may slip and use again. This is called a relapse.  A relapse does not mean that you have failed. It means you have had one or a few bad days.  Make sure you know why it is important for you to stay sober and remind yourself often.  Once you figure out what triggered the relapse and take the steps to deal with it, you can be even more hopeful that recovery is possible.       Current barriers:   Chronic Mental Health needs related to Depression, Schizophrenia and ongoing Substance Abuse Currently unable to independently self manage needs related to  mental health conditions due to triggers faced with SDOH needs. Homeless Lack of family/friend support Knowledge Deficits related to short term plan for care coordination  needs and long term plans for chronic disease management Need for in person evaluation by psychiatry or psychology to assess patient's cognition.   Lacks knowledge of where and how to connect for mental health and substance abuse support Needs Support, Education, and Care Coordination in order to meet unmet need.   Clinical Goal(s): Over the next 120 days, patient will work with SW to reduce or manage BH and SA symptoms until connected for ongoing counseling or finds  medication that helps his symptom relief.   Patient agreeable to gaining crisis support resources for him to revert back to in case of an emergency. Extensive education provided on each crisis resources:    24- Hour Availability:    [facility]  [address] [place], [place] [phone] Crisis [phone]   [facility] [phone]  [place] [facility]  [phone]    [facility]  [phone] (after hours)   Therapeutic Alternative/Mobile Crisis   [phone]   [place] National Suicide Hotline  289-567-0129 Len Child[place]) [place] [address]   Call [phone] for mental health emergencies   [facility]  [phone]);  [place] and [employer]  [phone]); [place], [place], [place], [place], [place], [place], [place]    [facility] Residents For 24/7 walk-up access to mental health services for [facility] children (4+), adolescents and adults, please visit the [facility] located at [address] in [place], [place].  *[place] also provides comprehensive outpatient behavioral health services in a variety of locations around the [place].  Connect With [place] [address] [place], [place] [address] HelpLine: 905-410-7412 or 1-(208) [phone]  Get Directions  Find Help 24/7 By Phone Call our 24-hour HelpLine at 661-401-8882 or [phone] for immediate assistance for mental health and substance abuse issues.  Walk-In Help [place]: [facility] (Ages 4 and Up) [place]: Emergency Dept., [facility] Additional Resources National Hopeline Network: 1-800-SUICIDE The National Suicide Prevention Lifeline: 1-800-273-TALK       Patient Goals/Self-Care Activities: Over the next 120 days Implement interventions discussed today to  decreases substance usage, symptoms of anxiety and depression and increase knowledge and/or ability of: coping skills, healthy habits, self-management skills, and stress reduction. Attend scheduled medical appointments Utilize healthy coping skills and supportive resources discussed Contact PCP with any questions or concerns Keep 90 percent of counseling appointments Call your insurance provider for more information about your Enhanced Benefits  Check out counseling resources provided  Incorporate into daily practice - relaxation techniques, deep breathing exercises, and mindfulness meditation strategies. Talk about feelings with friends, family members, spiritual advisor, etc. Contact LCSW directly [phone]), if you have questions, need assistance, or if additional social work needs are identified between now and our next scheduled telephone outreach call. Call 988 for mental health hotline/crisis line if needed (24/7 available) Try techniques to reduce symptoms of anxiety/negative thinking (deep breathing, distraction, positive self talk, etc)  - develop a personal safety plan - develop a plan to deal with triggers like holidays, anniversaries - exercise at least 2 to 3 times per week - have a plan for how to handle bad days - journal feelings and what helps to feel better or worse - spend time or talk  with others at least 2 to 3 times per week - watch for early signs of feeling worse - begin personal counseling - call and visit an old friend - check out volunteer opportunities - join a support group - laugh; watch a funny movie or comedian - learn and use visualization or guided imagery - perform a random act of kindness - practice relaxation or meditation daily - start or continue a personal journal - practice positive thinking and self-talk -continue with compliance of taking medication  -identify current effective and ineffective coping strategies.  -implement positive  self-talk in care to increase self-esteem, confidence and feelings of control.  -consider alternative and complementary therapy approaches such as meditation, mindfulness or yoga.  -journaling, prayer, worship services, meditation or pastoral counseling.  -increase participation in pleasurable group activities such as hobbies, singing, sports or volunteering).  -consider the use of meditative movement therapy such as tai chi, yoga or qigong.  -start a regular daily exercise program based on tolerance, ability and patient choice to support positive thinking and activity    If you are experiencing a Mental Health or Behavioral Health Crisis or need someone to talk to, please call the Suicide and Crisis Lifeline: 988    Patient Goals: Initial goal The following coping skill education was provided for stress relief and mental health management: "When your car dies or a deadline looms, how do you respond? Long-term, low-grade or acute stress takes a serious toll on your body and mind, so don't ignore feelings of constant tension. Stress is a natural part of life. However, too much stress can harm our health, especially if it continues every day. This is chronic stress and can put you at risk for heart problems like heart disease and depression. Understand what's happening inside your body and learn simple coping skills to combat the negative impacts of everyday stressors.  Types of Stress There are two types of stress: Emotional - types of emotional stress are relationship problems, pressure at work, financial worries, experiencing discrimination or having a major life change. Physical - Examples of physical stress include being sick having pain, not sleeping well, recovery from an injury or having an alcohol and drug use disorder. Fight or Flight Sudden or ongoing stress activates your nervous system and floods your bloodstream with adrenaline and cortisol, two hormones that raise blood pressure,  increase heart rate and spike blood sugar. These changes pitch your body into a fight or flight response. That enabled our ancestors to outrun saber-toothed tigers, and it's helpful today for situations like dodging a car accident. But most modern chronic stressors, such as finances or a challenging relationship, keep your body in that heightened state, which hurts your health. Effects of Too Much Stress If constantly under stress, most of [place] will eventually start to function less well.  Multiple studies link chronic stress to a higher risk of heart disease, stroke, depression, weight gain, memory loss and even premature death, so it's important to recognize the warning signals. Talk to your doctor about ways to manage stress if you're experiencing any of these symptoms: Prolonged periods of poor sleep. Regular, severe headaches. Unexplained weight loss or gain. Feelings of isolation, withdrawal or worthlessness. Constant anger and irritability. Loss of interest in activities. Constant worrying or obsessive thinking. Excessive alcohol or drug use. Inability to concentrate.  10 Ways to Cope with Chronic Stress It's key to recognize stressful situations as they occur because it allows you to focus on managing how you react.  We all need to know when to close our eyes and take a deep breath when we feel tension rising. Use these tips to prevent or reduce chronic stress. 1. Rebalance Work and Home All work and no play? If you're spending too much time at the office, intentionally put more dates in your calendar to enjoy time for fun, either alone or with others. 2. Get Regular Exercise Moving your body on a regular basis balances the nervous system and increases blood circulation, helping to flush out stress hormones. Even a daily 20-minute walk makes a difference. Any kind of exercise can lower stress and improve your mood ? just pick activities that you enjoy and make it a regular habit. 3. Eat  Well and Limit Alcohol and Stimulants Alcohol, nicotine and caffeine may temporarily relieve stress but have negative health impacts and can make stress worse in the long run. Well-nourished bodies cope better, so start with a good breakfast, add more organic fruits and vegetables for a well-balanced diet, avoid processed foods and sugar, try herbal tea and drink more water. 4. Connect with Supportive People Talking face to face with another person releases hormones that reduce stress. Lean on those good listeners in your life. 5. Carve Out Hobby Time Do you enjoy gardening, reading, listening to music or some other creative pursuit? Engage in activities that bring you pleasure and joy; research shows that reduces stress by almost half and lowers your heart rate, too. 6. Practice Meditation, Stress Reduction or Yoga Relaxation techniques activate a state of restfulness that counterbalances your body's fight-or-flight hormones. Even if this also means a 10-minute break in a long day: listen to music, read, go for a walk in nature, do a hobby, take a bath or spend time with a friend. Also consider doing a mindfulness exercise or try a daily deep breathing or imagery practice. Deep Breathing Slow, calm and deep breathing can help you relax. Try these steps to focus on your breathing and repeat as needed. Find a comfortable position and close your eyes. Exhale and drop your shoulders. Breathe in through your nose; fill your lungs and then your belly. Think of relaxing your body, quieting your mind and becoming calm and peaceful. Breathe out slowly through your nose, relaxing your belly. Think of releasing tension, pain, worries or distress. Repeat steps three and four until you feel relaxed. Imagery This involves using your mind to excite the senses -- sound, vision, smell, taste and feeling. This may help ease your stress. Begin by getting comfortable and then do some slow breathing. Imagine a place  you love being at. It could be somewhere from your childhood, somewhere you vacationed or just a place in your imagination. Feel how it is to be in the place you're imagining. Pay attention to the sounds, air, colors, and who is there with you. This is a place where you feel cared for and loved. All is well. You are safe. Take in all the smells, sounds, tastes and feelings. As you do, feel your body being nourished and healed. Feel the calm that surrounds you. Breathe in all the good. Breathe out any discomfort or tension. 7. Sleep Enough If you get less than seven to eight hours of sleep, your body won't tolerate stress as well as it could. If stress keeps you up at night, address the cause, and add extra meditation into your day to make up for the lost z's. Try to get seven to nine hours of sleep each  night. Make a regular bedtime schedule. Keep your room dark and cool. Try to avoid computers, TV, cell phones and tablets before bed. 8. Bond with Connections You Enjoy Go out for a coffee with a friend, chat with a neighbor, call a family member, visit with a clergy member, or even hang out with your pet. Clinical studies show that spending even a short time with a companion animal can cut anxiety levels almost in half. 9. Take a Vacation Getting away from it all can reset your stress tolerance by increasing your mental and emotional outlook, which makes you a happier, more productive person upon return. Leave your cellphone and laptop at home! 10. See a Counselor, Coach or Therapist If negative thoughts overwhelm your ability to make positive changes, it's time to seek professional help. Make an appointment today--your health and life are worth it."  Dickie La, BSW, MSW, LCSW Licensed Clinical Social Worker [employer]   [facility] [email or web address] Direct Dial: [phone]
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
## 2023-05-14 NOTE — Patient Instructions (Signed)
Follow up with [facility]
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
## 2023-05-15 ENCOUNTER — Emergency Department (HOSPITAL_COMMUNITY)
Admission: EM | Admit: 2023-05-15 | Discharge: 2023-05-16 | Disposition: A | Discharge status: Home / Self Care | Payer: Medicaid Other | Attending: Emergency Medicine | Admitting: Emergency Medicine

## 2023-05-15 ENCOUNTER — Other Ambulatory Visit: Payer: Self-pay

## 2023-05-15 DIAGNOSIS — Z4800 Encounter for change or removal of nonsurgical wound dressing: Secondary | ICD-10-CM | POA: Insufficient documentation

## 2023-05-15 DIAGNOSIS — Z5189 Encounter for other specified aftercare: Secondary | ICD-10-CM

## 2023-05-15 DIAGNOSIS — Z008 Encounter for other general examination: Secondary | ICD-10-CM

## 2023-05-15 DIAGNOSIS — R4689 Other symptoms and signs involving appearance and behavior: Secondary | ICD-10-CM | POA: Diagnosis present

## 2023-05-15 DIAGNOSIS — R001 Bradycardia, unspecified: Secondary | ICD-10-CM | POA: Diagnosis not present

## 2023-05-15 DIAGNOSIS — Z4801 Encounter for change or removal of surgical wound dressing: Secondary | ICD-10-CM | POA: Diagnosis not present

## 2023-05-15 LAB — CBC WITH DIFFERENTIAL/PLATELET
Abs Immature Granulocytes: 0.01 10*3/uL (ref 0.00–0.07)
Basophils Absolute: 0 10*3/uL (ref 0.0–0.1)
Basophils Relative: 1 %
Eosinophils Absolute: 0.1 10*3/uL (ref 0.0–0.5)
Eosinophils Relative: 1 %
HCT: 43.2 % (ref 39.0–52.0)
Hemoglobin: 14.4 g/dL (ref 13.0–17.0)
Immature Granulocytes: 0 %
Lymphocytes Relative: 25 %
Lymphs Abs: 1.2 10*3/uL (ref 0.7–4.0)
MCH: 31.8 pg (ref 26.0–34.0)
MCHC: 33.3 g/dL (ref 30.0–36.0)
MCV: 95.4 fL (ref 80.0–100.0)
Monocytes Absolute: 0.4 10*3/uL (ref 0.1–1.0)
Monocytes Relative: 8 %
Neutro Abs: 3.3 10*3/uL (ref 1.7–7.7)
Neutrophils Relative %: 65 %
Platelets: 198 10*3/uL (ref 150–400)
RBC: 4.53 MIL/uL (ref 4.22–5.81)
RDW: 12.2 % (ref 11.5–15.5)
WBC: 5.1 10*3/uL (ref 4.0–10.5)
nRBC: 0 % (ref 0.0–0.2)

## 2023-05-15 LAB — URINALYSIS, ROUTINE W REFLEX MICROSCOPIC
Bacteria, UA: NONE SEEN
Bilirubin Urine: NEGATIVE
Glucose, UA: >=500 mg/dL — AB
Hgb urine dipstick: NEGATIVE
Ketones, ur: NEGATIVE mg/dL
Leukocytes,Ua: NEGATIVE
Nitrite: NEGATIVE
Protein, ur: NEGATIVE mg/dL
Specific Gravity, Urine: 1.007 (ref 1.005–1.030)
pH: 5 (ref 5.0–8.0)

## 2023-05-15 LAB — COMPREHENSIVE METABOLIC PANEL
ALT: 21 U/L (ref 0–44)
AST: 27 U/L (ref 15–41)
Albumin: 4.1 g/dL (ref 3.5–5.0)
Alkaline Phosphatase: 67 U/L (ref 38–126)
Anion gap: 12 (ref 5–15)
BUN: 15 mg/dL (ref 6–20)
CO2: 21 mmol/L — ABNORMAL LOW (ref 22–32)
Calcium: 9.7 mg/dL (ref 8.9–10.3)
Chloride: 103 mmol/L (ref 98–111)
Creatinine, Ser: 1.33 mg/dL — ABNORMAL HIGH (ref 0.61–1.24)
GFR, Estimated: >60 mL/min (ref >60)
Glucose, Bld: 105 mg/dL — ABNORMAL HIGH (ref 70–99)
Potassium: 3.4 mmol/L — ABNORMAL LOW (ref 3.5–5.1)
Sodium: 136 mmol/L (ref 135–145)
Total Bilirubin: 0.8 mg/dL (ref 0.0–1.2)
Total Protein: 7.3 g/dL (ref 6.5–8.1)

## 2023-05-15 LAB — RAPID URINE DRUG SCREEN, HOSP PERFORMED
Amphetamines: NOT DETECTED
Barbiturates: NOT DETECTED
Benzodiazepines: NOT DETECTED
Cocaine: POSITIVE — AB
Opiates: NOT DETECTED
Tetrahydrocannabinol: NOT DETECTED

## 2023-05-15 LAB — ETHANOL: Alcohol, Ethyl (B): 47 mg/dL — ABNORMAL HIGH (ref <10)

## 2023-05-15 NOTE — ED Provider Triage Note (Signed)
Emergency Medicine Provider Triage Evaluation Note  Darius [name] , a 53 y.o. male  was evaluated in triage.  Pt complains of medical clearance.  Review of Systems  Positive: No complaints  Negative: Chest pain, shortness of breath, abdominal pain nausea vomiting  Physical Exam  BP 132/65 (BP Location: Right Arm)   Pulse 77   Temp 98.6 F (37 C) (Oral)   Resp 18   Ht 6' 1.5" (1.867 m)   Wt 73.5 kg   SpO2 100%   BMI 21.08 kg/m  Gen:   Awake, no distress   Resp:  Normal effort  MSK:   Moves extremities without difficulty  Other:    Medical Decision Making  Medically screening exam initiated at 5:43 PM.  Appropriate orders placed.  Darius [name] was informed that the remainder of the evaluation will be completed by another provider, this initial triage assessment does not replace that evaluation, and the importance of remaining in the ED until their evaluation is complete.  Patient reporting for medical clearance.  Reports she needs medical clearance before going to [facility] for addiction.  Denies symptoms or complaints.   Darius Knudsen, PA-C 05/15/23 1744

## 2023-05-15 NOTE — ED Triage Notes (Addendum)
Pt. Is here for clearance to go to [facility].  He has spoke with them and they do have a bed.  He has a hx of alcoholism and cocaine use.  Pt.s last drink was early this morning and cocaine use was this am.  Pt. Denies any symptoms.  No visible tremors. Pt is alert an oriented X 3 , wife with pt.    Pt. Very nice and cooperative.

## 2023-05-16 LAB — COMPREHENSIVE METABOLIC PANEL
ALT: 21 [IU]/L (ref 0–44)
AST: 29 [IU]/L (ref 0–40)
Albumin: 4.4 g/dL (ref 3.8–4.9)
Alkaline Phosphatase: 85 [IU]/L (ref 44–121)
BUN/Creatinine Ratio: 9 (ref 9–20)
BUN: 10 mg/dL (ref 6–24)
Bilirubin Total: 0.4 mg/dL (ref 0.0–1.2)
CO2: 24 mmol/L (ref 20–29)
Calcium: 10.1 mg/dL (ref 8.7–10.2)
Chloride: 103 mmol/L (ref 96–106)
Creatinine, Ser: 1.17 mg/dL (ref 0.76–1.27)
Globulin, Total: 2.6 g/dL (ref 1.5–4.5)
Glucose: 84 mg/dL (ref 70–99)
Potassium: 4.9 mmol/L (ref 3.5–5.2)
Sodium: 141 mmol/L (ref 134–144)
Total Protein: 7 g/dL (ref 6.0–8.5)
eGFR: 74 mL/min/{1.73_m2} (ref >59)

## 2023-05-16 LAB — CBC
Hematocrit: 41.2 % (ref 37.5–51.0)
Hemoglobin: 13.6 g/dL (ref 13.0–17.7)
MCH: 32.5 pg (ref 26.6–33.0)
MCHC: 33 g/dL (ref 31.5–35.7)
MCV: 98 fL — ABNORMAL HIGH (ref 79–97)
Platelets: 203 10*3/uL (ref 150–450)
RBC: 4.19 x10E6/uL (ref 4.14–5.80)
RDW: 12.2 % (ref 11.6–15.4)
WBC: 3.2 10*3/uL — ABNORMAL LOW (ref 3.4–10.8)

## 2023-05-16 LAB — LIPID PANEL
Chol/HDL Ratio: 3.2 {ratio} (ref 0.0–5.0)
Cholesterol, Total: 179 mg/dL (ref 100–199)
HDL: 56 mg/dL (ref >39)
LDL Chol Calc (NIH): 88 mg/dL (ref 0–99)
Triglycerides: 208 mg/dL — ABNORMAL HIGH (ref 0–149)
VLDL Cholesterol Cal: 35 mg/dL (ref 5–40)

## 2023-05-16 LAB — MICROALBUMIN / CREATININE URINE RATIO
Creatinine, Urine: 138.1 mg/dL
Microalb/Creat Ratio: 5 mg/g{creat} (ref 0–29)
Microalbumin, Urine: 7.1 ug/mL

## 2023-05-16 LAB — HEMOGLOBIN A1C
Est. average glucose Bld gHb Est-mCnc: 91 mg/dL
Hgb A1c MFr Bld: 4.8 % (ref 4.8–5.6)
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
## 2023-05-16 NOTE — Discharge Instructions (Addendum)
Your labs and vital signs are reassuring. I don't see anything that would preclude you from receiving help in your journey to quit and I wish you the best of luck.

## 2023-05-18 ENCOUNTER — Other Ambulatory Visit: Payer: Self-pay | Admitting: *Deleted

## 2023-05-18 NOTE — Patient Instructions (Signed)
Visit Information  Darius [name] was given information about Medicaid Managed Care team care coordination services as a part of their [facility] Medicaid benefit. Darius [name] verbally consented to engagement with the [facility] Managed Care team.   If you are experiencing a medical emergency, please call 911 or report to your local emergency department or urgent care.   If you have a non-emergency medical problem during routine business hours, please contact your provider's office and ask to speak with a nurse.   For questions related to your [facility], please call: [phone] or visit the homepage here: [email or web address]  If you would like to schedule transportation through your [facility], please call the following number at least 2 days in advance of your appointment: [phone]   Rides for urgent appointments can also be made after hours by calling Member Services.  Call the Behavioral Health Crisis Line at [phone], at any time, 24 hours a day, 7 days a week. If you are in danger or need immediate medical attention call 911.  If you would like help to quit smoking, call 1-800-QUIT-NOW (941-807-7877) OR Espaol: 1-855-Djelo-Ya (4-034-742-5956) o para ms informacin haga clic aqu or Text READY to 387-564 to register via text  Darius [name],   Please see education materials related to CHF provided by MyChart link.  Patient verbalizes understanding of instructions and care plan provided today and agrees to view in MyChart. Active MyChart status and patient understanding of how to access instructions and care plan via MyChart confirmed with patient.     Telephone follow up appointment with Managed Medicaid care management team member scheduled for:06/18/23 at 9am  Estanislado Emms RN, BSN [place]  Value[facility]Based [facility] RN Care  Manager [phone]   Following is a copy of your plan of care:  Care Plan : RN Care Manager Plan of Care  Updates made by Heidi Dach, RN since 05/18/2023 12:00 AM     Problem: Health Managment needs related to CHF      Long-Range Goal: Development of Plan of Care to address Health Managment needs related to CHF   Start Date: 05/18/2023  Expected End Date: 08/16/2023  Note:   Current Barriers:  Chronic Disease Management support and education needs related to CHF   RNCM Clinical Goal(s):  Patient will verbalize understanding of plan for management of CHF as evidenced by patient reports take all medications exactly as prescribed and will call provider for medication related questions as evidenced by patient reports attend all scheduled medical appointments: 06/01/23 with BSW, LCSW 06/04/23 with LCSW and 06/17/23 with PCP as evidenced by Provider documentation in EMR continue to work with RN Care Manager to address care management and care coordination needs related to  CHF as evidenced by adherence to CM Team Scheduled appointments through collaboration with RN Care manager, provider, and care team.   Interventions: Evaluation of current treatment plan related to  self management and patient's adherence to plan as established by provider   Heart Failure Interventions:  (Status:  New goal.) Long Term Goal Basic overview and discussion of pathophysiology of Heart Failure reviewed Provided education on low sodium diet Reviewed Heart Failure Action Plan in depth and provided written copy Discussed the importance of keeping all appointments with provider Assessed social determinant of health barriers  Discussed the importance of 6-8 hours of sleep every night Discussed patient needing dental evaluation prior to scheduling for heart procedure-patient is starting  a 30 day substance abuse program with [facility] on 05/19/23 and would like to schedule once program completed Reviewed medications,  advised patient to discuss 90 day refills with provider Provided patient with medical transportation provided by [facility] [phone]  Patient Goals/Self-Care Activities: Take all medications as prescribed Attend all scheduled provider appointments Work with the social worker to address care coordination needs and will continue to work with the clinical team to address health care and disease management related needs call office if I gain more than 2 pounds in one day or 5 pounds in one week use salt in moderation watch for swelling in feet, ankles and legs every day weigh myself daily eat more whole grains, fruits and vegetables, lean meats and healthy fats  Follow Up Plan:  Telephone follow up appointment with care management team member scheduled for:  06/18/23 at 9am

## 2023-05-18 NOTE — Patient Outreach (Signed)
Medicaid Managed Care   Nurse Care Manager Note  05/18/2023 Name:  Darius [name] MRN:  [id number] DOB:  [birth year]  Darius [name] is an 53 y.o. year old male who is a primary patient of Sherlyn Hay, DO.  [facility] Managed Care Coordination team was consulted for assistance with:    CHF  Darius [name] was given information about Medicaid Managed Care Coordination team services today. Darius [name] Patient agreed to services and verbal consent obtained.  Engaged with patient by telephone for initial visit in response to provider referral for case management and/or care coordination services.   Patient is participating in a Managed Medicaid Plan:  Yes  Assessments/Interventions:  Review of past medical history, allergies, medications, health status, including review of consultants reports, laboratory and other test data, was performed as part of comprehensive evaluation and provision of chronic care management services.  SDOH (Social Drivers of Health) assessments and interventions performed: SDOH Interventions    Flowsheet Row Patient Outreach Telephone from 05/18/2023 in [place] [facility] POP[facility] DEPART[facility] Most recent reading at 05/18/2023  9:24 AM Patient Outreach Telephone from 05/14/2023 in [place] POPULATION HEALTH DEPARTMENT Most recent reading at 05/14/2023  2:51 PM Office Visit from 05/14/2023 in [facility] Most recent reading at 05/14/2023 10:39 AM Congregational Nurse Program from 04/02/2023 in [facility] Congregational Nurse Program  Most recent reading at 04/02/2023 12:28 PM Telephone from 12/26/2022 in [facility] Most recent reading at 12/26/2022  2:01 PM HEART & VASCULAR TRANSITION OF CARE NEW from 12/13/2021 in [facility] Most recent reading at 12/13/2021 12:06 PM  SDOH Interventions        Food Insecurity Interventions -- [employer] Provided --  Intervention Not Indicated  [client has EBT] -- --  Housing Interventions -- [employer] Provided, AMB Referral -- Intervention Not Indicated -- --  Transportation Interventions Payor Benefit  [Provided patient with [facility] medical transportation] -- -- Intervention Not Indicated  [client has Mediciad transportation and family support] Payor Benefit --  Utilities Interventions Intervention Not Indicated -- -- -- -- --  Depression Interventions/Treatment  -- Referral to Psychiatry  [Crisis resources provided including [facility] walk in clinic hours and number [phone]] Referral to Psychiatry -- -- --  Surveyor, quantity Strain Interventions -- [employer] Provided -- -- -- Artist  Stress Interventions -- [employer] Provided, Provide Counseling -- -- -- --       Care Plan  Allergies  Allergen Reactions   Avelox [Moxifloxacin] Other (See Comments)    Thoracic Aneurysm.   Ciprofloxacin Other (See Comments)    Has thoracic Aneurysm   Penicillins Other (See Comments)    Patient states that mother is highly allergic. 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  Has patient had a PCN reaction causing immediate rash, facial/tongue/throat swelling, SOB or lightheadedness with hypotension: No Has patient had a PCN reaction causing severe rash involving mucus membranes or skin necrosis: No Has patient had a PCN reaction that required hospitalization Yes Has patient had a PCN reaction occurring within the last 10 years: No If all of the above answers are "N    Medications Reviewed Today     Reviewed by Darius Dach, RN (Registered Nurse) on 05/18/23 at [phone]  Med List Status: <None>   Medication Order Taking? Sig Documenting Provider Last Dose Status Informant  empagliflozin (JARDIANCE) 10 MG TABS tablet [id number] Yes Take 1  tablet (10 mg total) by mouth daily before breakfast. Jacklynn Ganong, FNP Taking Active   furosemide (LASIX) 20 MG tablet [id number] Yes  Take 2 tablets (40 mg total) by mouth daily. [place], Anderson Malta, FNP Taking Active   metoprolol succinate (TOPROL-XL) 25 MG 24 hr tablet [id number] Yes Take 1 tablet (25 mg total) by mouth daily. Laurey Morale, MD Taking Active   sacubitril-valsartan [facility]) 24-26 [place] [id number] Yes Take 1 tablet by mouth 2 (two) times daily. [place], [place], FNP Taking Active            Med Note Darius [name] Jan 12, 2023  9:56 AM) Reports taking one tablet daily  spironolactone (ALDACTONE) 25 MG tablet [id number] Yes Take 1 tablet (25 mg total) by mouth daily. Laurey Morale, MD Taking Active             Patient Active Problem List   Diagnosis Date Noted   Chronic kidney disease, stage 2, mildly decreased GFR 05/14/2023   Cocaine abuse, episodic use (HCC) 05/14/2023   Marijuana smoker, episodic 05/14/2023   Aortic atherosclerosis (HCC) 05/14/2023   Coronary artery disease involving native coronary artery of native heart with angina pectoris (HCC) 05/14/2023   Moderately severe major depression (HCC) 05/14/2023   Schizophrenia (HCC) 05/14/2023   Normocytic anemia 05/14/2023   Acute on chronic congestive heart failure (HCC)    Aortic regurgitation due to bicuspid aortic valve 12/02/2021   Valvular cardiomyopathy (HCC) 10/10/2021   Aortic insufficiency due to bicuspid aortic valve 10/10/2021   Malnutrition of moderate degree 09/17/2021   Chronic combined systolic and diastolic heart failure (HCC) 09/15/2021   COPD (chronic obstructive pulmonary disease) (HCC) 09/15/2021   Abnormal LFTs 09/15/2021   Polysubstance abuse (HCC) 09/15/2021   Acid reflux 08/27/2021   Nasal congestion 08/27/2021   History of panic attacks 05/29/2021   Ascending aorta dilatation (HCC) 05/29/2021   Shortness of breath 08/15/2020   Abdominal pain 07/11/2020   Essential hypertension 12/30/2017   Tobacco use 12/30/2017   Substance use disorder 12/30/2017   (HFpEF) heart failure with preserved ejection  fraction (HCC) 12/18/2017    Conditions to be addressed/monitored per PCP order:  CHF  Care Plan : RN Care Manager Plan of Care  Updates made by Darius Dach, RN since 05/18/2023 12:00 AM     Problem: Health Managment needs related to CHF      Long-Range Goal: Development of Plan of Care to address Health Managment needs related to CHF   Start Date: 05/18/2023  Expected End Date: 08/16/2023  Note:   Current Barriers:  Chronic Disease Management support and education needs related to CHF   RNCM Clinical Goal(s):  Patient will verbalize understanding of plan for management of CHF as evidenced by patient reports take all medications exactly as prescribed and will call provider for medication related questions as evidenced by patient reports attend all scheduled medical appointments: 06/01/23 with BSW, LCSW 06/04/23 with LCSW and 06/17/23 with PCP as evidenced by Provider documentation in EMR continue to work with RN Care Manager to address care management and care coordination needs related to  CHF as evidenced by adherence to CM Team Scheduled appointments through collaboration with RN Care manager, provider, and care team.   Interventions: Evaluation of current treatment plan related to  self management and patient's adherence to plan as established by provider   Heart Failure Interventions:  (Status:  New goal.) Long Term Goal Basic overview and discussion of  pathophysiology of Heart Failure reviewed Provided education on low sodium diet Reviewed Heart Failure Action Plan in depth and provided written copy Discussed the importance of keeping all appointments with provider Assessed social determinant of health barriers  Discussed the importance of 6-8 hours of sleep every night Discussed patient needing dental evaluation prior to scheduling for heart procedure-patient is starting a 30 day substance abuse program with [facility] on 05/19/23 and would like to schedule once program  completed Reviewed medications, advised patient to discuss 90 day refills with provider Provided patient with medical transportation provided by [facility] [phone]  Patient Goals/Self-Care Activities: Take all medications as prescribed Attend all scheduled provider appointments Work with the social worker to address care coordination needs and will continue to work with the clinical team to address health care and disease management related needs call office if I gain more than 2 pounds in one day or 5 pounds in one week use salt in moderation watch for swelling in feet, ankles and legs every day weigh myself daily eat more whole grains, fruits and vegetables, lean meats and healthy fats  Follow Up Plan:  Telephone follow up appointment with care management team member scheduled for:  06/18/23 at 9am      Follow Up:  Patient agrees to Care Plan and Follow-up.  Plan: The Managed Medicaid care management team will reach out to the patient again over the next 30 days.  Date/time of next scheduled RN care management/care coordination outreach:  06/18/23 at 9am  Estanislado Emms RN, BSN [place]  Value[facility]Based [facility] RN Care Manager [phone]

## 2023-05-22 ENCOUNTER — Encounter: Payer: Self-pay | Admitting: Family Medicine

## 2023-05-22 ENCOUNTER — Telehealth: Payer: Self-pay

## 2023-05-22 NOTE — Telephone Encounter (Signed)
 Copied from CRM [id number]. Topic: General - Other >> May 21, 2023  1:58 PM Clide Dales wrote: Patient called to inquire about getting a tailored medicaid plan. Please advise.

## 2023-05-24 ENCOUNTER — Encounter: Payer: Self-pay | Admitting: Family Medicine

## 2023-05-24 DIAGNOSIS — Z87828 Personal history of other (healed) physical injury and trauma: Secondary | ICD-10-CM | POA: Insufficient documentation

## 2023-05-24 DIAGNOSIS — Z5900 Homelessness unspecified: Secondary | ICD-10-CM | POA: Insufficient documentation

## 2023-05-24 NOTE — Assessment & Plan Note (Signed)
Noted.  No acute concerns.  Continue to monitor.

## 2023-05-24 NOTE — Assessment & Plan Note (Signed)
Noted. Refer to psychiatry urgently for evaluation and recommendations.

## 2023-05-24 NOTE — Assessment & Plan Note (Signed)
Will recheck today.

## 2023-05-24 NOTE — Assessment & Plan Note (Addendum)
Chronic congestive heart failure with aortic insufficiency and mitral valve disease. Pending heart surgery. Ejection fraction declined from 60-65% in 2019 to 30% in 2023. Currently on guideline-directed medical therapy. Discussed the importance of continuing medications and the risks of heart surgery. Patient seeking a second opinion. - Continue spironolactone, Entresto, metoprolol, Lasix, Jardiance - Refer to cardiology for follow-up and echocardiogram - Discuss second opinion for cardiothoracic surgery

## 2023-05-24 NOTE — Assessment & Plan Note (Signed)
Noted. Continue GDMT.  Follows with cardiology; defer to specialist management.

## 2023-05-24 NOTE — Assessment & Plan Note (Signed)
Patient was recommended to have a valve replacement. Recommended he follow up with his cardiologist as he missed his echocardiogram appointment. Discussed that this may determine whether or not he still needs the recommended surgery.  - Defer to specialist management.

## 2023-05-24 NOTE — Assessment & Plan Note (Signed)
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 Has not needed albuterol recently. - Encourage smoking cessation - Continue current medications - Monitor respiratory status

## 2023-05-24 NOTE — Assessment & Plan Note (Signed)
Well-controlled today on GDMT for heart failure; defer to cardiologist management.

## 2023-05-24 NOTE — Assessment & Plan Note (Addendum)
Noted - ascending thoracic aorta measures 4.0 cm in diameter as of 2022.    - Recommend annual imaging followup by CTA or MRA. Discuss at follow up visit as we did not have sufficient time during this visit and other concerns were more pressing.

## 2023-05-24 NOTE — Assessment & Plan Note (Signed)
Intermittent use of marijuana and cocaine, with recent use two days ago. Use often in response to stress. Discussed severe heart risks associated with cocaine use. - Refer to clinical social work for care coordination and support - Refer to psychiatry for further evaluation and support
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
## 2023-05-24 NOTE — Assessment & Plan Note (Signed)
Addressed as noted above.
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
## 2023-05-24 NOTE — Assessment & Plan Note (Signed)
Noted. Continue GDMT.

## 2023-05-24 NOTE — Assessment & Plan Note (Signed)
Currently staying in a motel with financial constraints, facing eviction tomorrow. No stable housing options available. Patient willing to go to rehab for temporary housing. - Refer to clinical social work for housing resources and support - Discuss shelter options if immediate housing is not found

## 2023-05-28 IMAGING — CR DG CHEST 2V
2 series · 2 of 2 positions shown · non-contrast
Comparison: 09/15/2020

CLINICAL DATA: Patient says there is an animal moving around in the
skin under his chest he thinks it is a worm

EXAM:
CHEST - 2 VIEW

[chest pa]
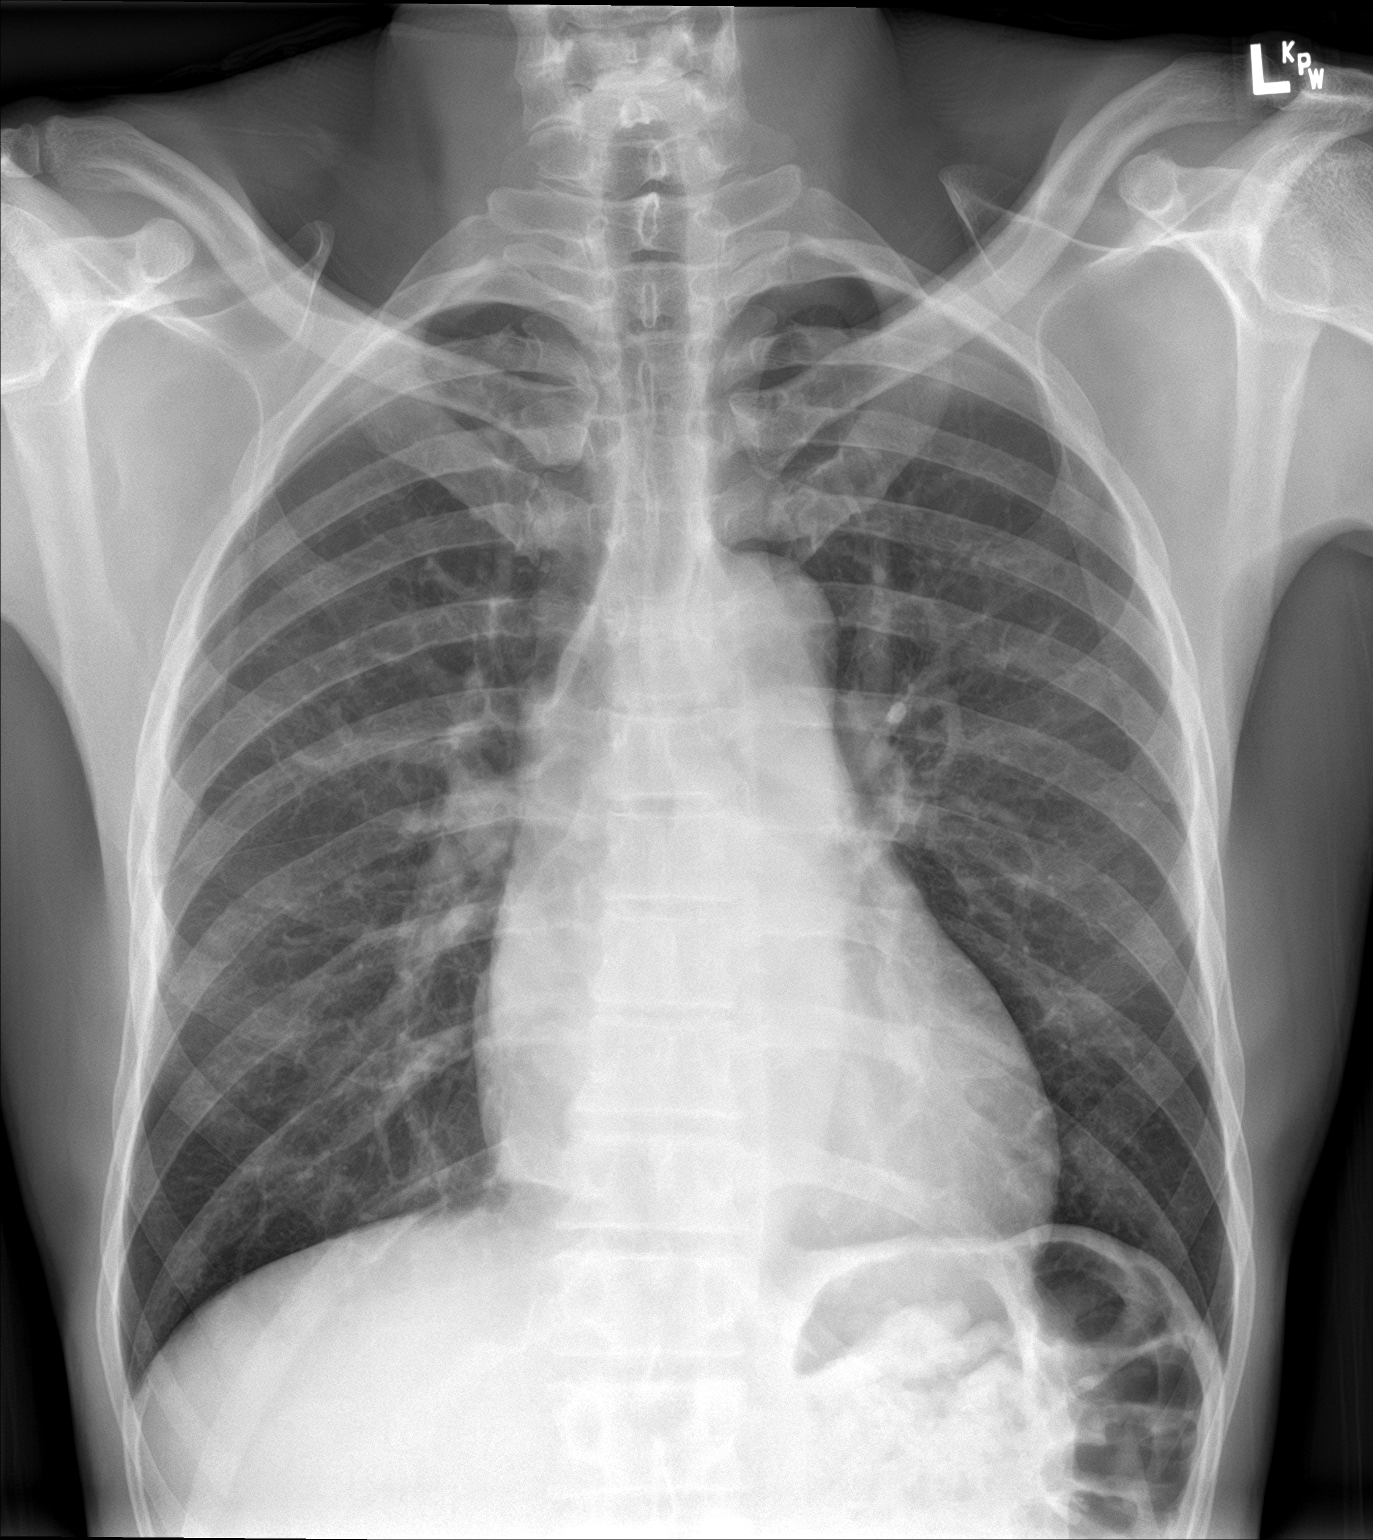

[chest lat]
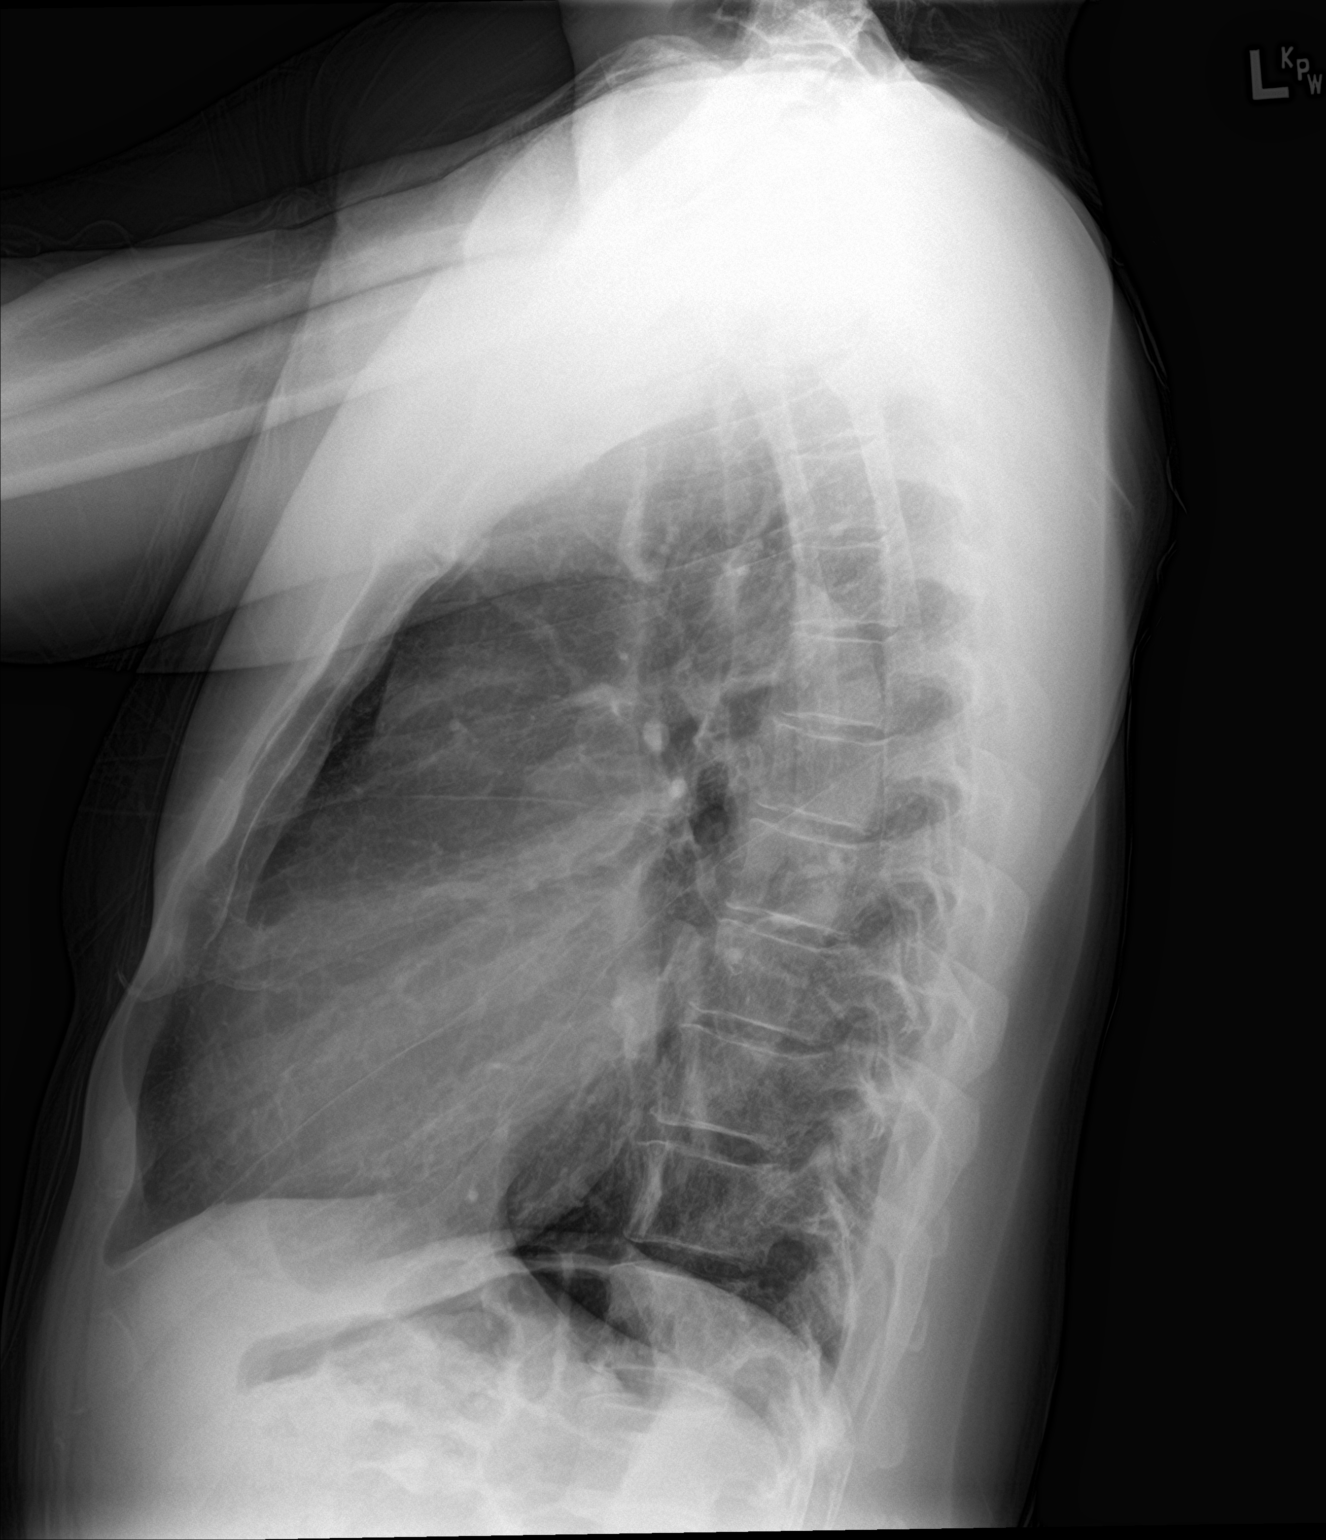

[2 of 2 positions shown; findings below may reference images not displayed]

FINDINGS: The heart size and mediastinal contours are within normal limits.
Both lungs are clear. The visualized skeletal structures are
unremarkable.
IMPRESSION: No active cardiopulmonary disease.

## 2023-06-01 ENCOUNTER — Other Ambulatory Visit (HOSPITAL_COMMUNITY): Payer: Self-pay | Admitting: Cardiology

## 2023-06-01 ENCOUNTER — Other Ambulatory Visit: Payer: Self-pay

## 2023-06-01 ENCOUNTER — Other Ambulatory Visit (HOSPITAL_COMMUNITY): Payer: Self-pay
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
## 2023-06-01 NOTE — Patient Instructions (Signed)
Visit Information  Mr. Newbern was given information about Medicaid Managed Care team care coordination services as a part of their [facility] Community Plan Medicaid benefit. Floyde Alper verbally consented to engagement with the [facility] Managed Care team.   If you are experiencing a medical emergency, please call 911 or report to your local emergency department or urgent care.   If you have a non-emergency medical problem during routine business hours, please contact your provider's office and ask to speak with a nurse.   For questions related to your [facility], please call: [phone] or visit the homepage here: [email or web address]  If you would like to schedule transportation through your [facility], please call the following number at least 2 days in advance of your appointment: [phone]   Rides for urgent appointments can also be made after hours by calling Member Services.  Call the Behavioral Health Crisis Line at [phone], at any time, 24 hours a day, 7 days a week. If you are in danger or need immediate medical attention call 911.  If you would like help to quit smoking, call 1-800-QUIT-NOW (838-374-8141) OR Espaol: 1-855-Djelo-Ya (1-324-401-0272) o para ms informacin haga clic aqu or Text READY to 536-644 to register via text  Mr. Jorgensen - following are the goals we discussed in your visit today:   Goals Addressed   None      Social Worker will follow up in 30 days.   Gus Puma, Kenard Gower, MHA [facility]  Managed Medicaid Social Worker [phone]   Following is a copy of your plan of care:  There are no care plans that you recently modified to display for this patient.

## 2023-06-02 ENCOUNTER — Other Ambulatory Visit (HOSPITAL_COMMUNITY): Payer: Self-pay

## 2023-06-02 MED ORDER — SPIRONOLACTONE 25 MG PO TABS
25.0000 mg | ORAL_TABLET | Freq: Every day | ORAL | 3 refills | Status: DC
  Filled 2023-06-02: qty 30, 30d supply, fill #0

## 2023-06-02 MED ORDER — METOPROLOL SUCCINATE ER 25 MG PO TB24
25.0000 mg | ORAL_TABLET | Freq: Every day | ORAL | 3 refills | Status: DC
  Filled 2023-06-02: qty 30, 30d supply, fill #0
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
## 2023-06-02 NOTE — ED Triage Notes (Signed)
 Pt is from [facility] and they want him cleared fro detox. Pt was brought by [facility]. Pt last drink was yesterday and no drug use noted.

## 2023-06-04 ENCOUNTER — Telehealth: Payer: Self-pay | Admitting: Family

## 2023-06-04 ENCOUNTER — Other Ambulatory Visit: Payer: Self-pay | Admitting: Licensed Clinical Social Worker

## 2023-06-04 DIAGNOSIS — F322 Major depressive disorder, single episode, severe without psychotic features: Secondary | ICD-10-CM

## 2023-06-04 DIAGNOSIS — F1910 Other psychoactive substance abuse, uncomplicated: Secondary | ICD-10-CM

## 2023-06-04 DIAGNOSIS — F209 Schizophrenia, unspecified: Secondary | ICD-10-CM
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
## 2023-06-04 NOTE — Patient Instructions (Signed)
 Visit Information  [name] was given information about Medicaid Managed Care team care coordination services as a part of their [facility] Community Plan Medicaid benefit. Darius [name] verbally consented to engagement with the [facility] Managed Care team.   If you are experiencing a medical emergency, please call 911 or report to your local emergency department or urgent care.   If you have a non-emergency medical problem during routine business hours, please contact your provider's office and ask to speak with a nurse.   For questions related to your [facility], please call: [phone] or visit the homepage here: [email or web address]  If you would like to schedule transportation through your [facility], please call the following number at least 2 days in advance of your appointment: [phone]   Rides for urgent appointments can also be made after hours by calling Member Services.  Call the Behavioral Health Crisis Line at [phone], at any time, 24 hours a day, 7 days a week. If you are in danger or need immediate medical attention call 911.  If you would like help to quit smoking, call 1-800-QUIT-NOW ((336)138-1771) OR Espaol: 1-855-Djelo-Ya (8-144-664-6430) o para ms informacin haga clic aqu or Text READY to 799-599 to register via text  Following is a copy of your plan of care:  Care Plan : General Social Work (Adult)  Updates made by Darius Lyle CROME, LCSW since 06/04/2023 12:00 AM     Problem: Coping Skills (General Plan of Care)      Goal: Coping Skills Enhanced   Note:   Timeframe:  Short Range Goal Priority:  High Start Date:  05/14/23                Expected End Date: ongoing                 Follow Up Date- 07/02/23 at 3 pm   - avoid negative self-talk - be honest with myself - begin personal counseling - check out recovery housing - exercise at  least 2 to 3 times per week - identify a recovery coach (sponsor at [facility]) - identify triggers for relapse - learn how to handle negative feelings without drugs or alcohol - make a list of pleasurable things to do without using alcohol or drugs - make a plan to deal with triggers like holidays and anniversaries - make a plan to fix broken relationships - make a plan to handle bad days - make a written relapse plan - consider attending NA (Narcotics Anonymous) - spend time or talk with a recovery coach or support person at least 2 or 3 times every week - spend time or talk with people who do not drink or use every day    Why is this important?   It is possible that after you stop drinking or using drugs that you may slip and use again. This is called a relapse.  A relapse does not mean that you have failed. It means you have had one or a few bad days.  Make sure you know why it is important for you to stay sober and remind yourself often.  Once you figure out what triggered the relapse and take the steps to deal with it, you can be even more hopeful that recovery is possible.       Current barriers:   Chronic Mental Health needs related to Depression, Schizophrenia and ongoing Substance Abuse Currently unable to independently self manage needs related to  mental health conditions due to triggers faced with SDOH needs. Homeless Lack of family/friend support Knowledge Deficits related to short term plan for care coordination  needs and long term plans for chronic disease management Need for in person evaluation by psychiatry or psychology to assess patient's cognition.   Lacks knowledge of where and how to connect for mental health and substance abuse support Needs Support, Education, and Care Coordination in order to meet unmet need.   Clinical Goal(s): Over the next 120 days, patient will work with SW to reduce or manage BH and SA symptoms until connected for ongoing counseling or finds  medication that helps his symptom relief.   Patient agreeable to gaining crisis support resources for him to revert back to in case of an emergency. Extensive education provided on each crisis resources:    24- Hour Availability:    [facility]  [address] [place], [place] [phone] Crisis [phone]   [facility] [phone]  [place] [facility]  [phone]    [facility]  [phone] (after hours)   Therapeutic Alternative/Mobile Crisis   [phone]   [place]  National Suicide Hotline  ([phone] Darius [name]) OR 988   Call [phone] for mental health emergencies   [facility]  [phone]);  [place] and [employer]  [phone]); [place], [employer] [place], [place], [place], [place], [place], [place]    [facility] Residents For 24/7 walk-up access to mental health services for [facility] children (4+), adolescents and adults, please visit the [facility] located at [address] in [place], [place].  *[place] also provides comprehensive outpatient behavioral health services in a variety of locations around the [place].  Connect With Us  [address] [place], [place] [address] HelpLine: ([phone] or [phone]  Get Directions  Find Help 24/7 By Phone Call our 24-hour HelpLine at [phone] or [phone] for immediate assistance for mental health and substance abuse issues.  Walk-In Help [place]: [facility] (Ages 4 and Up) [place]: Emergency Dept., [facility] Additional Resources National Hopeline Network: 1-800-SUICIDE The National Suicide Prevention Lifeline: 1-800-273-TALK       Patient Goals/Self-Care Activities: Over the next 120 days Implement interventions discussed today to  decreases substance usage, symptoms of anxiety and depression and increase knowledge and/or ability of: coping skills, healthy habits, self-management skills, and stress reduction. Attend scheduled medical appointments Utilize healthy coping skills and supportive resources discussed Contact PCP with any questions or concerns Keep 90 percent of counseling appointments Call your insurance provider for more information about your Enhanced Benefits  Check out counseling resources provided  Incorporate into daily practice - relaxation techniques, deep breathing exercises, and mindfulness meditation strategies. Talk about feelings with friends, family members, spiritual advisor, etc. Contact LCSW directly [phone]), if you have questions, need assistance, or if additional social work needs are identified between now and our next scheduled telephone outreach call. Call 988 for mental health hotline/crisis line if needed (24/7 available) Try techniques to reduce symptoms of anxiety/negative thinking (deep breathing, distraction, positive self talk, etc)  - develop a personal safety plan - develop a plan to deal with triggers like holidays, anniversaries - exercise at least 2 to 3 times per week - have a plan for how to handle bad days - journal feelings and what helps to feel better or worse - spend time or talk  with others at least 2 to 3 times per week - watch for early signs of feeling worse - begin personal counseling - call and visit an old friend - check out volunteer opportunities - join a support group - laugh; watch a funny movie or comedian - learn and use visualization or guided imagery - perform a random act of kindness - practice relaxation or meditation daily - start or continue a personal journal - practice positive thinking and self-talk -continue with compliance of taking medication  -identify current effective and ineffective coping strategies.  -implement positive  self-talk in care to increase self-esteem, confidence and feelings of control.  -consider alternative and complementary therapy approaches such as meditation, mindfulness or yoga.  -journaling, prayer, worship services, meditation or pastoral counseling.  -increase participation in pleasurable group activities such as hobbies, singing, sports or volunteering).  -consider the use of meditative movement therapy such as tai chi, yoga or qigong.  -start a regular daily exercise program based on tolerance, ability and patient choice to support positive thinking and activity    If you are experiencing a Mental Health or Behavioral Health Crisis or need someone to talk to, please call the Suicide and Crisis Lifeline: 988      24- Hour Availability:    [facility]  [address] [place], [place] [phone] Crisis [phone]   [facility] [phone]  [place] [facility]  [phone]    [facility] [place] [facility]  [phone] (after hours)   Therapeutic Alternative/Mobile Crisis   [phone]   [place]  National Suicide Hotline  (951)169-4281 Darius [name]) OR 988   Call 988 for mental health emergencies   [facility]  [phone]);  [place] and [employer]  [phone]); [place], [place], [place], [place], [place], [place], [place]    [place] [facility] Residents For 24/7 walk-up access to mental health services for [facility] children (4+), adolescents and adults, please visit the [facility] located at [address] in [place], [place].  *[place] also provides comprehensive outpatient behavioral health services in a variety of locations around the [place].  Connect With Us  [address] [place], [place] [address] HelpLine: [phone] or [phone]  Get Directions  Find Help 24/7 By  Phone Call our 24-hour HelpLine at 608-219-1660 or [phone] for immediate assistance for mental health and substance abuse issues.  Walk-In Help [place]: [facility] (Ages 4 and Up) [place]: Emergency Dept., [facility] Additional Resources National Hopeline Network: 1-800-SUICIDE The National Suicide Prevention Lifeline: 1-800-273-TALK     The following coping skill education was provided for stress relief and mental health management: When your car dies or a deadline looms, how do you respond? Long-term, low-grade or acute stress takes a serious toll on your body and mind, so don't ignore feelings of constant tension. Stress is a natural part of life. However, too much stress can harm our health, especially if it continues every day. This is chronic stress and can put you at risk for heart problems like heart disease and depression. Understand what's happening inside your body and learn simple coping skills to combat the negative impacts of everyday stressors.  Types of Stress There are two types of stress: Emotional - types of emotional stress are relationship problems, pressure at work, financial worries, experiencing discrimination or having a major life change. Physical - Examples of  physical stress include being sick having pain, not sleeping well, recovery from an injury or having an alcohol and drug use disorder. Fight or Flight Sudden or ongoing stress activates your nervous system and floods your bloodstream with adrenaline and cortisol, two hormones that raise blood pressure, increase heart rate and spike blood sugar. These changes pitch your body into a fight or flight response. That enabled our ancestors to outrun saber-toothed tigers, and it's helpful today for situations like dodging a car accident. But most modern chronic stressors, such as finances or a challenging relationship, keep your body in that  heightened state, which hurts your health. Effects of Too Much Stress If constantly under stress, most of us  will eventually start to function less well.  Multiple studies link chronic stress to a higher risk of heart disease, stroke, depression, weight gain, memory loss and even premature death, so it's important to recognize the warning signals. Talk to your doctor about ways to manage stress if you're experiencing any of these symptoms: Prolonged periods of poor sleep. Regular, severe headaches. Unexplained weight loss or gain. Feelings of isolation, withdrawal or worthlessness. Constant anger and irritability. Loss of interest in activities. Constant worrying or obsessive thinking. Excessive alcohol or drug use. Inability to concentrate.  10 Ways to Cope with Chronic Stress It's key to recognize stressful situations as they occur because it allows you to focus on managing how you react. We all need to know when to close our eyes and take a deep breath when we feel tension rising. Use these tips to prevent or reduce chronic stress. 1. Rebalance Work and Home All work and no play? If you're spending too much time at the office, intentionally put more dates in your calendar to enjoy time for fun, either alone or with others. 2. Get Regular Exercise Moving your body on a regular basis balances the nervous system and increases blood circulation, helping to flush out stress hormones. Even a daily 20-minute walk makes a difference. Any kind of exercise can lower stress and improve your mood ? just pick activities that you enjoy and make it a regular habit. 3. Eat Well and Limit Alcohol and Stimulants Alcohol, nicotine  and caffeine may temporarily relieve stress but have negative health impacts and can make stress worse in the long run. Well-nourished bodies cope better, so start with a good breakfast, add more organic fruits and vegetables for a well-balanced diet, avoid processed foods and sugar,  try herbal tea and drink more water . 4. Connect with Supportive People Talking face to face with another person releases hormones that reduce stress. Lean on those good listeners in your life. 5. Carve Out Hobby Time Do you enjoy gardening, reading, listening to music or some other creative pursuit? Engage in activities that bring you pleasure and joy; research shows that reduces stress by almost half and lowers your heart rate, too. 6. Practice Meditation, Stress Reduction or Yoga Relaxation techniques activate a state of restfulness that counterbalances your body's fight-or-flight hormones. Even if this also means a 10-minute break in a long day: listen to music, read, go for a walk in nature, do a hobby, take a bath or spend time with a friend. Also consider doing a mindfulness exercise or try a daily deep breathing or imagery practice. Deep Breathing Slow, calm and deep breathing can help you relax. Try these steps to focus on your breathing and repeat as needed. Find a comfortable position and close your eyes. Exhale and drop your shoulders. Breathe  in through your nose; fill your lungs and then your belly. Think of relaxing your body, quieting your mind and becoming calm and peaceful. Breathe out slowly through your nose, relaxing your belly. Think of releasing tension, pain, worries or distress. Repeat steps three and four until you feel relaxed. Imagery This involves using your mind to excite the senses -- sound, vision, smell, taste and feeling. This may help ease your stress. Begin by getting comfortable and then do some slow breathing. Imagine a place you love being at. It could be somewhere from your childhood, somewhere you vacationed or just a place in your imagination. Feel how it is to be in the place you're imagining. Pay attention to the sounds, air, colors, and who is there with you. This is a place where you feel cared for and loved. All is well. You are safe. Take in all the  smells, sounds, tastes and feelings. As you do, feel your body being nourished and healed. Feel the calm that surrounds you. Breathe in all the good. Breathe out any discomfort or tension. 7. Sleep Enough If you get less than seven to eight hours of sleep, your body won't tolerate stress as well as it could. If stress keeps you up at night, address the cause, and add extra meditation into your day to make up for the lost z's. Try to get seven to nine hours of sleep each night. Make a regular bedtime schedule. Keep your room dark and cool. Try to avoid computers, TV, cell phones and tablets before bed. 8. Bond with Connections You Enjoy Go out for a coffee with a friend, chat with a neighbor, call a family member, visit with a clergy member, or even hang out with your pet. Clinical studies show that spending even a short time with a companion animal can cut anxiety levels almost in half. 9. Take a Vacation Getting away from it all can reset your stress tolerance by increasing your mental and emotional outlook, which makes you a happier, more productive person upon return. Leave your cellphone and laptop at home! 10. See a Counselor, Coach or Therapist If negative thoughts overwhelm your ability to make positive changes, it's time to seek professional help. Make an appointment today--your health and life are worth it.  Lyle Rung, BSW, MSW, LCSW Licensed Clinical Social Worker [employer]   [email or web address] Direct Dial: [phone]

## 2023-06-04 NOTE — Telephone Encounter (Signed)
 Returned phone call to Latisha at [facility] requesting medical clearance for this patient to be in their program due to his blood pressure. BP 2 days ago was 86/43 and he was sent to the ER. He returned back to them today and his BP is 107/55. Due to the low BP he had earlier this week, the medical doctor at the [facility] is requesting medical clearance.   Advised her that patient would need to be seen in the HF clinic as he hasn't been seen since 09/24. She says that she will tell the patient to call and get an appointment scheduled.

## 2023-06-04 NOTE — Telephone Encounter (Signed)
 Pt confirmed appt for 06/05/23

## 2023-06-05 ENCOUNTER — Encounter: Payer: Self-pay | Admitting: Family

## 2023-06-05 ENCOUNTER — Other Ambulatory Visit (HOSPITAL_COMMUNITY): Payer: Self-pay

## 2023-06-05 ENCOUNTER — Ambulatory Visit: Payer: Medicaid Other | Attending: Family | Admitting: Family

## 2023-06-05 ENCOUNTER — Encounter: Payer: Medicaid Other | Admitting: Family

## 2023-06-05 ENCOUNTER — Other Ambulatory Visit: Payer: Self-pay

## 2023-06-05 VITALS — BP 101/47 | HR 56 | Wt 166.2 lb

## 2023-06-05 DIAGNOSIS — I110 Hypertensive heart disease with heart failure: Secondary | ICD-10-CM | POA: Diagnosis not present

## 2023-06-05 DIAGNOSIS — Z79899 Other long term (current) drug therapy: Secondary | ICD-10-CM | POA: Diagnosis not present

## 2023-06-05 DIAGNOSIS — R9431 Abnormal electrocardiogram [ECG] [EKG]: Secondary | ICD-10-CM | POA: Insufficient documentation

## 2023-06-05 DIAGNOSIS — Q2381 Bicuspid aortic valve: Secondary | ICD-10-CM | POA: Insufficient documentation

## 2023-06-05 DIAGNOSIS — F1410 Cocaine abuse, uncomplicated: Secondary | ICD-10-CM | POA: Diagnosis not present

## 2023-06-05 DIAGNOSIS — F1010 Alcohol abuse, uncomplicated: Secondary | ICD-10-CM | POA: Diagnosis not present

## 2023-06-05 DIAGNOSIS — E119 Type 2 diabetes mellitus without complications: Secondary | ICD-10-CM | POA: Diagnosis not present

## 2023-06-05 DIAGNOSIS — R0602 Shortness of breath: Secondary | ICD-10-CM | POA: Diagnosis present

## 2023-06-05 DIAGNOSIS — Q231 Congenital insufficiency of aortic valve: Secondary | ICD-10-CM

## 2023-06-05 DIAGNOSIS — I351 Nonrheumatic aortic (valve) insufficiency: Secondary | ICD-10-CM | POA: Diagnosis not present

## 2023-06-05 DIAGNOSIS — I428 Other cardiomyopathies: Secondary | ICD-10-CM | POA: Insufficient documentation

## 2023-06-05 DIAGNOSIS — Z7984 Long term (current) use of oral hypoglycemic drugs: Secondary | ICD-10-CM | POA: Diagnosis not present

## 2023-06-05 DIAGNOSIS — F1910 Other psychoactive substance abuse, uncomplicated: Secondary | ICD-10-CM

## 2023-06-05 DIAGNOSIS — F1210 Cannabis abuse, uncomplicated: Secondary | ICD-10-CM | POA: Insufficient documentation

## 2023-06-05 DIAGNOSIS — I5022 Chronic systolic (congestive) heart failure: Secondary | ICD-10-CM

## 2023-06-05 DIAGNOSIS — F17210 Nicotine dependence, cigarettes, uncomplicated: Secondary | ICD-10-CM | POA: Diagnosis not present

## 2023-06-05 DIAGNOSIS — I7121 Aneurysm of the ascending aorta, without rupture: Secondary | ICD-10-CM | POA: Diagnosis not present

## 2023-06-05 DIAGNOSIS — I10 Essential (primary) hypertension: Secondary | ICD-10-CM | POA: Diagnosis not present

## 2023-06-05 MED ORDER — FUROSEMIDE 20 MG PO TABS
20.0000 mg | ORAL_TABLET | Freq: Every day | ORAL | 3 refills | Status: DC
  Filled 2023-06-05: qty 90, 90d supply, fill #0

## 2023-06-05 MED ORDER — SPIRONOLACTONE 25 MG PO TABS
12.5000 mg | ORAL_TABLET | Freq: Every day | ORAL | 3 refills | Status: DC
  Filled 2023-06-05: qty 45, 90d supply, fill #0

## 2023-06-05 MED ORDER — EMPAGLIFLOZIN 10 MG PO TABS
10.0000 mg | ORAL_TABLET | Freq: Every day | ORAL | 3 refills | Status: DC
  Filled 2023-06-05: qty 90, 90d supply, fill #0

## 2023-06-05 MED ORDER — METOPROLOL SUCCINATE ER 25 MG PO TB24
12.5000 mg | ORAL_TABLET | Freq: Every day | ORAL | 3 refills | Status: DC
  Filled 2023-06-05: qty 45, 90d supply, fill #0

## 2023-06-05 MED ORDER — SACUBITRIL-VALSARTAN 24-26 MG PO TABS
1.0000 | ORAL_TABLET | Freq: Two times a day (BID) | ORAL | 3 refills | Status: DC
  Filled 2023-06-05: qty 180, 90d supply, fill #0
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
## 2023-06-05 NOTE — Patient Instructions (Addendum)
 DECREASE YOUR METOPROLOL  TO 12.5 MG ONCE DAILY (1/2 TABLET)  DECREASE YOUR SPIRONOLACTONE  TO 125 MG ONCE DAILY (1/2 TABLET)  DECREASE YOUR LASIX  TO 20 MG ONCE DAILY

## 2023-06-15 ENCOUNTER — Encounter: Payer: Self-pay | Admitting: Physician Assistant

## 2023-06-15 ENCOUNTER — Ambulatory Visit: Payer: Medicaid Other | Admitting: Physician Assistant

## 2023-06-15 VITALS — BP 95/54 | HR 58 | Ht 73.0 in | Wt 169.0 lb

## 2023-06-15 DIAGNOSIS — Z7984 Long term (current) use of oral hypoglycemic drugs: Secondary | ICD-10-CM

## 2023-06-15 DIAGNOSIS — M792 Neuralgia and neuritis, unspecified: Secondary | ICD-10-CM

## 2023-06-15 DIAGNOSIS — I10 Essential (primary) hypertension: Secondary | ICD-10-CM

## 2023-06-15 DIAGNOSIS — E119 Type 2 diabetes mellitus without complications: Secondary | ICD-10-CM | POA: Diagnosis not present

## 2023-06-15 DIAGNOSIS — I77810 Thoracic aortic ectasia: Secondary | ICD-10-CM | POA: Diagnosis not present

## 2023-06-15 DIAGNOSIS — I5032 Chronic diastolic (congestive) heart failure: Secondary | ICD-10-CM

## 2023-06-15 DIAGNOSIS — F17210 Nicotine dependence, cigarettes, uncomplicated: Secondary | ICD-10-CM

## 2023-06-15 DIAGNOSIS — F1410 Cocaine abuse, uncomplicated: Secondary | ICD-10-CM

## 2023-06-15 DIAGNOSIS — F1010 Alcohol abuse, uncomplicated: Secondary | ICD-10-CM

## 2023-06-15 MED ORDER — GABAPENTIN 100 MG PO CAPS: 100.0000 mg | ORAL_CAPSULE | Freq: Every day | ORAL | 1 refills | Status: DC

## 2023-06-15 MED ORDER — FUROSEMIDE 20 MG PO TABS: 10.0000 mg | ORAL_TABLET | Freq: Every day | ORAL | 1 refills | Status: DC

## 2023-06-15 NOTE — Patient Instructions (Signed)
VISIT SUMMARY:  During your visit, we discussed your ongoing health issues, including heart failure, diabetes, neuropathic pain, and your upcoming open heart surgery. Adjustments were made to your medications to better manage your conditions and reduce side effects. We also reviewed your recent experiences and concerns, including pain from a previous injury and your recovery progress.  YOUR PLAN:  -HEART FAILURE: Heart failure means your heart is not pumping blood as well as it should. We have reduced your furosemide dose from 20mg  to 10mg  daily to help manage your low blood pressure. Please continue to monitor your blood pressure daily.   -NEUROPATHIC PAIN: Neuropathic pain is caused by nerve damage, which can result in pain, tingling, or numbness. We are starting you on gabapentin at bedtime to help manage the pain radiating down your right leg.  -SUBSTANCE USE: You have a history of marijuana use but have stopped and are currently in a recovery program. Please continue with the recovery program for your well-being.  INSTRUCTIONS:  Please check your blood pressure daily and start taking gabapentin at bedtime. Reduce your furosemide dose to 10mg  daily. We will see you again in two weeks for a follow-up appointment.
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
## 2023-06-17 ENCOUNTER — Encounter: Payer: Self-pay | Admitting: Family Medicine

## 2023-06-18 ENCOUNTER — Other Ambulatory Visit: Payer: Self-pay | Admitting: *Deleted

## 2023-06-18 NOTE — Patient Instructions (Signed)
Visit Information  Mr. Khoa Opdahl  - as a part of your Medicaid benefit, you are eligible for care management and care coordination services at no cost or copay. I was unable to reach you by phone today but would be happy to help you with your health related needs. Please feel free to call me @ [phone].   A member of the Managed Medicaid care management team will reach out to you again over the next 7 days.   Estanislado Emms RN, BSN [place]  Value[facility] Health RN Care Manager [phone]

## 2023-06-18 NOTE — Patient Outreach (Signed)
  Medicaid Managed Care   Unsuccessful Attempt Note   06/18/2023 Name: Darius [name] MRN: [id number] DOB: [birth year]  Referred by: Sherlyn Hay, DO Reason for referral : High Risk Managed Medicaid (Unsuccessful RNCM telephone outreach)   An unsuccessful telephone outreach was attempted today. The patient was referred to the case management team for assistance with care management and care coordination.    Follow Up Plan: A HIPAA compliant phone message was left for the patient providing contact information and requesting a return call. and The Managed Medicaid care management team will reach out to the patient again over the next 7 days.    Estanislado Emms RN, BSN [place]  Value-Based Care [facility] Health RN Care Manager [phone]

## 2023-06-29 ENCOUNTER — Encounter: Payer: Self-pay | Admitting: Physician Assistant

## 2023-06-29 ENCOUNTER — Ambulatory Visit: Admitting: Physician Assistant

## 2023-06-29 VITALS — BP 105/62 | Wt 174.0 lb

## 2023-06-29 DIAGNOSIS — F1410 Cocaine abuse, uncomplicated: Secondary | ICD-10-CM

## 2023-06-29 DIAGNOSIS — F1010 Alcohol abuse, uncomplicated: Secondary | ICD-10-CM

## 2023-06-29 DIAGNOSIS — I10 Essential (primary) hypertension: Secondary | ICD-10-CM | POA: Diagnosis not present

## 2023-06-29 DIAGNOSIS — I5032 Chronic diastolic (congestive) heart failure: Secondary | ICD-10-CM

## 2023-06-29 DIAGNOSIS — K6289 Other specified diseases of anus and rectum: Secondary | ICD-10-CM

## 2023-06-29 DIAGNOSIS — K219 Gastro-esophageal reflux disease without esophagitis: Secondary | ICD-10-CM

## 2023-06-29 DIAGNOSIS — Z5982 Transportation insecurity: Secondary | ICD-10-CM

## 2023-06-29 DIAGNOSIS — M792 Neuralgia and neuritis, unspecified: Secondary | ICD-10-CM | POA: Diagnosis not present

## 2023-06-29 DIAGNOSIS — I77810 Thoracic aortic ectasia: Secondary | ICD-10-CM

## 2023-06-29 DIAGNOSIS — F17210 Nicotine dependence, cigarettes, uncomplicated: Secondary | ICD-10-CM
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
## 2023-06-30 ENCOUNTER — Other Ambulatory Visit: Payer: Self-pay | Admitting: Cardiology

## 2023-06-30 ENCOUNTER — Other Ambulatory Visit (HOSPITAL_COMMUNITY): Payer: Self-pay

## 2023-06-30 ENCOUNTER — Other Ambulatory Visit (HOSPITAL_COMMUNITY): Payer: Self-pay | Admitting: Family Medicine

## 2023-06-30 DIAGNOSIS — F1010 Alcohol abuse, uncomplicated: Secondary | ICD-10-CM | POA: Insufficient documentation

## 2023-06-30 DIAGNOSIS — R0602 Shortness of breath: Secondary | ICD-10-CM

## 2023-06-30 DIAGNOSIS — M792 Neuralgia and neuritis, unspecified: Secondary | ICD-10-CM | POA: Insufficient documentation

## 2023-07-02 ENCOUNTER — Encounter (HOSPITAL_COMMUNITY): Payer: Self-pay

## 2023-07-02 ENCOUNTER — Other Ambulatory Visit (HOSPITAL_COMMUNITY): Payer: Self-pay

## 2023-07-02 ENCOUNTER — Other Ambulatory Visit: Payer: Self-pay | Admitting: Licensed Clinical Social Worker

## 2023-07-02 NOTE — Patient Instructions (Signed)
 Darius [name] ,   The [facility] Managed Care Team is available to provide assistance to you with your healthcare needs at no cost and as a benefit of your [facility] Health plan. I'm sorry I was unable to reach you today for our scheduled appointment. Our care guide will call you to reschedule our telephone appointment. Please call me at the number below. I am available to be of assistance to you regarding your healthcare needs. .   Thank you,   Dickie La, BSW, MSW, LCSW Licensed Clinical Social Worker [employer]   [email or web address] Direct Dial: [phone]

## 2023-07-02 NOTE — Patient Outreach (Signed)
  Medicaid Managed Care   Unsuccessful Attempt Note   07/02/2023 Name: Darius [name] MRN: [id number] DOB: [birth year]  Referred by: Sherlyn Hay, DO Reason for referral : No chief complaint on file.   A second unsuccessful telephone outreach was attempted today. The patient was referred to the case management team for assistance with care management and care coordination.    Follow Up Plan: The Managed Medicaid care management team will reach out to the patient again over the next 30 days.   Dickie La, BSW, MSW, LCSW Licensed Clinical Social Worker [employer]   [email or web address] Direct Dial: [phone]

## 2023-07-06 DIAGNOSIS — H18891 Other specified disorders of cornea, right eye: Secondary | ICD-10-CM | POA: Diagnosis not present

## 2023-07-10 ENCOUNTER — Other Ambulatory Visit (HOSPITAL_COMMUNITY): Payer: Self-pay

## 2023-07-10 ENCOUNTER — Ambulatory Visit: Payer: Self-pay | Admitting: Licensed Clinical Social Worker

## 2023-07-10 NOTE — Patient Outreach (Signed)
 Care Coordination   Follow Up Visit Note   07/10/2023 Name: Kasem Mozer MRN: [id number] DOB: 12-18-1970  Kenshin Splawn is a 53 y.o. year old male who sees Pardue, Monico Blitz, DO for primary care. I spoke with  Ronelle Canizalez by phone today.  What matters to the patients health and wellness today?  Pt reports no immediate concerns. Pt reports mood improved since moving closer to family.     Goals Addressed   None     SDOH assessments and interventions completed:  Yes  SDOH Interventions Today    Flowsheet Row Most Recent Value  SDOH Interventions   Utilities Interventions Intervention Not Indicated        Care Coordination Interventions:  Yes, provided  Interventions Today    Flowsheet Row Most Recent Value  General Interventions   General Interventions Discussed/Reviewed Community Resources  [Pt reports he secured a safe residence and have food and spouse is supportive.]  Mental Health Interventions   Mental Health Discussed/Reviewed Mental Health Reviewed, Other  [Pt reports no immediate mental health needs. Pt reports mood has improved since moving closer to family in [place].]       Follow up plan: Follow up call scheduled for 07/31/2023    Encounter Outcome:  Patient Visit Completed   Gwyndolyn Saxon MSW, LCSW Licensed Clinical Social Worker  [facility], Population Health Direct Dial: [phone]  Fax: [phone]

## 2023-07-10 NOTE — Patient Instructions (Signed)
 Visit Information  Thank you for taking time to visit with me today. Please don't hesitate to contact me if I can be of assistance to you.   Following are the goals we discussed today:   Goals Addressed   None     Our next appointment is by telephone on 07/31/2023 at 10am  Please call the care guide team at [phone] if you need to cancel or reschedule your appointment.   If you are experiencing a Mental Health or Behavioral Health Crisis or need someone to talk to, please call 911   Patient verbalizes understanding of instructions and care plan provided today and agrees to view in MyChart. Active MyChart status and patient understanding of how to access instructions and care plan via MyChart confirmed with patient.     Telephone follow up appointment with care management team member scheduled for:07/31/2023  Gwyndolyn Saxon MSW, LCSW Licensed Clinical Social Worker  [facility], Population Health Direct Dial: [phone]  Fax: [phone]

## 2023-07-13 ENCOUNTER — Other Ambulatory Visit: Payer: Self-pay | Admitting: *Deleted

## 2023-07-13 NOTE — Patient Outreach (Signed)
  Medicaid Managed Care   Unsuccessful Attempt Note   07/13/2023 Name: Darius [name] MRN: [id number] DOB: December 31, 1970  Referred by: Sherlyn Hay, DO Reason for referral : High Risk Managed Medicaid (Unsuccessful RNCM follow up telephone outreach)   An unsuccessful telephone outreach was attempted today. The patient was referred to the case management team for assistance with care management and care coordination.    Follow Up Plan: The Managed Medicaid care management team will reach out to the patient again over the next 7 days.    Estanislado Emms RN, BSN [place]  Value-Based Care [facility] Health RN Care Manager [phone]

## 2023-07-13 NOTE — Patient Instructions (Signed)
 Visit Information  [name]  - as a part of your Medicaid benefit, you are eligible for care management and care coordination services at no cost or copay. I was unable to reach you by phone today but would be happy to help you with your health related needs. Please feel free to call me @ [phone].   A member of the Managed Medicaid care management team will reach out to you again over the next 7 days.   Estanislado Emms RN, BSN [place]  [facility] RN Care Manager [phone]

## 2023-07-14 ENCOUNTER — Telehealth: Payer: Self-pay

## 2023-07-14 ENCOUNTER — Other Ambulatory Visit (HOSPITAL_COMMUNITY): Payer: Self-pay

## 2023-07-14 ENCOUNTER — Ambulatory Visit: Admitting: Physician Assistant

## 2023-07-14 ENCOUNTER — Encounter: Payer: Self-pay | Admitting: Physician Assistant

## 2023-07-14 VITALS — BP 85/42 | HR 63 | Ht 73.5 in

## 2023-07-14 DIAGNOSIS — M79645 Pain in left finger(s): Secondary | ICD-10-CM | POA: Diagnosis not present

## 2023-07-14 DIAGNOSIS — Z125 Encounter for screening for malignant neoplasm of prostate: Secondary | ICD-10-CM

## 2023-07-14 DIAGNOSIS — Z2821 Immunization not carried out because of patient refusal: Secondary | ICD-10-CM | POA: Insufficient documentation

## 2023-07-14 DIAGNOSIS — G8929 Other chronic pain: Secondary | ICD-10-CM | POA: Diagnosis not present

## 2023-07-14 DIAGNOSIS — Z1211 Encounter for screening for malignant neoplasm of colon: Secondary | ICD-10-CM

## 2023-07-14 DIAGNOSIS — I10 Essential (primary) hypertension: Secondary | ICD-10-CM

## 2023-07-14 DIAGNOSIS — Z87828 Personal history of other (healed) physical injury and trauma: Secondary | ICD-10-CM

## 2023-07-14 DIAGNOSIS — M545 Low back pain, unspecified: Secondary | ICD-10-CM | POA: Diagnosis not present

## 2023-07-14 DIAGNOSIS — I5032 Chronic diastolic (congestive) heart failure: Secondary | ICD-10-CM | POA: Diagnosis not present

## 2023-07-14 DIAGNOSIS — I952 Hypotension due to drugs: Secondary | ICD-10-CM

## 2023-07-14 MED ORDER — FUROSEMIDE 20 MG PO TABS
10.0000 mg | ORAL_TABLET | Freq: Every day | ORAL | 1 refills | Status: DC
Start: 2023-07-14 — End: 2023-09-17
  Filled 2023-07-14 – 2023-07-20 (×2): qty 15, 30d supply, fill #0
  Filled 2023-08-13: qty 15, 30d supply, fill #1

## 2023-07-14 MED ORDER — ENTRESTO 24-26 MG PO TABS
ORAL_TABLET | Freq: Two times a day (BID) | ORAL | 1 refills | Status: DC
  Filled 2023-07-14 – 2023-08-13 (×2): qty 30, 30d supply, fill #0
  Filled 2023-09-17 (×2): qty 30, 30d supply, fill #1
  Filled 2023-10-15: qty 30, 30d supply, fill #2

## 2023-07-14 MED ORDER — METOPROLOL SUCCINATE ER 25 MG PO TB24
12.5000 mg | ORAL_TABLET | Freq: Every day | ORAL | 1 refills | Status: DC
  Filled 2023-07-14 – 2023-08-13 (×2): qty 15, 30d supply, fill #0

## 2023-07-14 MED ORDER — EMPAGLIFLOZIN 10 MG PO TABS
10.0000 mg | ORAL_TABLET | Freq: Every day | ORAL | 1 refills | Status: DC
  Filled 2023-07-14: qty 30, 30d supply, fill #0
  Filled 2023-08-13: qty 30, 30d supply, fill #1

## 2023-07-14 MED ORDER — ENTRESTO 24-26 MG PO TABS
1.0000 | ORAL_TABLET | Freq: Two times a day (BID) | ORAL | 1 refills | Status: DC
  Filled 2023-07-14: qty 60, 30d supply, fill #0

## 2023-07-14 MED ORDER — BLOOD PRESSURE CUFF MISC
1.0000 [IU] | Freq: Every day | 0 refills | Status: AC
Start: 2023-07-14 — End: ?

## 2023-07-14 MED ORDER — SPIRONOLACTONE 25 MG PO TABS
12.5000 mg | ORAL_TABLET | Freq: Every day | ORAL | 1 refills | Status: DC
  Filled 2023-07-14 – 2023-08-13 (×2): qty 15, 30d supply, fill #0
  Filled 2023-09-17 (×2): qty 15, 30d supply, fill #1

## 2023-07-14 NOTE — Progress Notes (Signed)
 Complex Care Management Note  Care Guide Note 07/14/2023 Name: Darius [name] MRN: [id number] DOB: 1971/01/21  Darius [name] is a 53 y.o. year old male who sees Pardue, Monico Blitz, DO for primary care. I reached out to [employer] by phone today to offer complex care management services.  Darius [name] was given information about Complex Care Management services today including:   The Complex Care Management services include support from the care team which includes your Nurse Care Manager, Clinical Social Worker, or Pharmacist.  The Complex Care Management team is here to help remove barriers to the health concerns and goals most important to you. Complex Care Management services are voluntary, and the patient may decline or stop services at any time by request to their care team member.   Complex Care Management Consent Status: Patient wishes to consider information provided and/or speak with a member of the care team before deciding to participate in complex care management services.   Follow up plan:  Telephone appointment with complex care management team member scheduled for:  07/22/23 at 10:00 a.m.   Encounter Outcome:  Patient Scheduled  [facility]  [facility], [facility] Care Management Assistant Direct Dial: [phone]  Fax: [phone]

## 2023-07-14 NOTE — Patient Instructions (Addendum)
 Darius Emms RN, BSN [place]  [facility] RN Care Manager [phone]   VISIT SUMMARY:  During today's visit, we discussed your concerns about your current medications, upcoming open heart surgery, and various health issues. We addressed your heart failure management, back pain, orthopedic issues, medication management, dental issues, and overdue screenings. We also talked about your housing situation and the need for assistance with disability paperwork.  YOUR PLAN:  -CHRONIC BACK PAIN: Chronic back pain is long-lasting pain in your back. Since gabapentin caused adverse effects, we have discontinued it and will refer you to orthopedics for further evaluation and management.  -ORTHOPEDIC ISSUES: You have chronic pain in your left pinky finger due to an improperly healed fracture. We will refer you to orthopedics to evaluate  -DENTAL ISSUES: You have a cavity and cracked teeth that need attention before your surgery. We will provide you with a list of dentists that accept your insurance for dental clearance.  -COLORECTAL CANCER SCREENING: You are overdue for colorectal cancer screening. We recommend Cologuard, a non-invasive test, and will order it for you.  -PROSTATE CANCER SCREENING: Given your family history of prostate cancer, you are due for screening. We will order a PSA test for you.  We will call you with today's lab results.  Please return to the mobile unit in 6 months, or sooner if needed.  Roney Jaffe, PA-C Physician Assistant [facility] Mobile Medicine [email or web address]    ENTRESTO 1/2 TAB TWICE DAILY
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
## 2023-07-15 ENCOUNTER — Encounter: Payer: Self-pay | Admitting: Physician Assistant

## 2023-07-15 DIAGNOSIS — G8929 Other chronic pain: Secondary | ICD-10-CM | POA: Insufficient documentation

## 2023-07-15 DIAGNOSIS — Z2821 Immunization not carried out because of patient refusal: Secondary | ICD-10-CM | POA: Insufficient documentation

## 2023-07-15 LAB — CBC WITH DIFFERENTIAL/PLATELET
Basophils Absolute: 0 10*3/uL (ref 0.0–0.2)
Basos: 0 %
EOS (ABSOLUTE): 0.1 10*3/uL (ref 0.0–0.4)
Eos: 3 %
Hematocrit: 40.9 % (ref 37.5–51.0)
Hemoglobin: 13.6 g/dL (ref 13.0–17.7)
Immature Grans (Abs): 0 10*3/uL (ref 0.0–0.1)
Immature Granulocytes: 1 %
Lymphocytes Absolute: 0.8 10*3/uL (ref 0.7–3.1)
Lymphs: 23 %
MCH: 31.3 pg (ref 26.6–33.0)
MCHC: 33.3 g/dL (ref 31.5–35.7)
MCV: 94 fL (ref 79–97)
Monocytes Absolute: 0.3 10*3/uL (ref 0.1–0.9)
Monocytes: 8 %
Neutrophils Absolute: 2.2 10*3/uL (ref 1.4–7.0)
Neutrophils: 65 %
Platelets: 191 10*3/uL (ref 150–450)
RBC: 4.34 x10E6/uL (ref 4.14–5.80)
RDW: 12.4 % (ref 11.6–15.4)
WBC: 3.4 10*3/uL (ref 3.4–10.8)

## 2023-07-15 LAB — PSA: Prostate Specific Ag, Serum: 0.7 ng/mL (ref 0.0–4.0)

## 2023-07-15 LAB — COMP. METABOLIC PANEL (12)
AST: 24 IU/L (ref 0–40)
Albumin: 4.3 g/dL (ref 3.8–4.9)
Alkaline Phosphatase: 77 IU/L (ref 44–121)
BUN/Creatinine Ratio: 13 (ref 9–20)
BUN: 18 mg/dL (ref 6–24)
Bilirubin Total: 0.6 mg/dL (ref 0.0–1.2)
Calcium: 9.6 mg/dL (ref 8.7–10.2)
Chloride: 104 mmol/L (ref 96–106)
Creatinine, Ser: 1.37 mg/dL — ABNORMAL HIGH (ref 0.76–1.27)
Globulin, Total: 2.3 g/dL (ref 1.5–4.5)
Glucose: 88 mg/dL (ref 70–99)
Potassium: 5 mmol/L (ref 3.5–5.2)
Sodium: 142 mmol/L (ref 134–144)
Total Protein: 6.6 g/dL (ref 6.0–8.5)
eGFR: 61 mL/min/{1.73_m2} (ref >59)

## 2023-07-20 ENCOUNTER — Other Ambulatory Visit (HOSPITAL_COMMUNITY): Payer: Self-pay

## 2023-07-22 ENCOUNTER — Ambulatory Visit: Payer: Self-pay
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
## 2023-07-24 ENCOUNTER — Other Ambulatory Visit (INDEPENDENT_AMBULATORY_CARE_PROVIDER_SITE_OTHER): Payer: Self-pay

## 2023-07-24 ENCOUNTER — Telehealth: Payer: Self-pay | Admitting: Family

## 2023-07-24 ENCOUNTER — Ambulatory Visit: Admitting: Physician Assistant

## 2023-07-24 ENCOUNTER — Encounter: Payer: Self-pay | Admitting: Physician Assistant

## 2023-07-24 DIAGNOSIS — R52 Pain, unspecified: Secondary | ICD-10-CM | POA: Diagnosis not present

## 2023-07-24 NOTE — Telephone Encounter (Incomplete)
 Called to confirm/remind patient of their appointment [facility] on 07/27/23***.   Appointment:   [x] Confirmed  [] Left mess   [] No answer/No voice mail  [] Phone not in service  Patient reminded to bring all medications and/or complete list.  Confirmed patient has transportation. Gave directions, instructed to utilize valet parking.
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
## 2023-07-27 ENCOUNTER — Ambulatory Visit: Attending: Family | Admitting: Family

## 2023-07-27 ENCOUNTER — Other Ambulatory Visit: Payer: Self-pay

## 2023-07-27 ENCOUNTER — Encounter: Payer: Self-pay | Admitting: Family

## 2023-07-27 VITALS — BP 81/47 | HR 60 | Wt 184.4 lb

## 2023-07-27 DIAGNOSIS — I5022 Chronic systolic (congestive) heart failure: Secondary | ICD-10-CM | POA: Diagnosis not present

## 2023-07-27 DIAGNOSIS — Z79899 Other long term (current) drug therapy: Secondary | ICD-10-CM | POA: Diagnosis not present

## 2023-07-27 DIAGNOSIS — F1910 Other psychoactive substance abuse, uncomplicated: Secondary | ICD-10-CM

## 2023-07-27 DIAGNOSIS — F17210 Nicotine dependence, cigarettes, uncomplicated: Secondary | ICD-10-CM | POA: Diagnosis not present

## 2023-07-27 DIAGNOSIS — Z7984 Long term (current) use of oral hypoglycemic drugs: Secondary | ICD-10-CM | POA: Diagnosis not present

## 2023-07-27 DIAGNOSIS — I10 Essential (primary) hypertension: Secondary | ICD-10-CM

## 2023-07-27 DIAGNOSIS — I7121 Aneurysm of the ascending aorta, without rupture: Secondary | ICD-10-CM | POA: Diagnosis not present

## 2023-07-27 DIAGNOSIS — E119 Type 2 diabetes mellitus without complications: Secondary | ICD-10-CM | POA: Diagnosis not present

## 2023-07-27 DIAGNOSIS — I110 Hypertensive heart disease with heart failure: Secondary | ICD-10-CM | POA: Insufficient documentation

## 2023-07-27 DIAGNOSIS — Z5982 Transportation insecurity: Secondary | ICD-10-CM | POA: Diagnosis not present

## 2023-07-27 DIAGNOSIS — I080 Rheumatic disorders of both mitral and aortic valves: Secondary | ICD-10-CM | POA: Insufficient documentation

## 2023-07-27 DIAGNOSIS — Q231 Congenital insufficiency of aortic valve: Secondary | ICD-10-CM

## 2023-07-27 DIAGNOSIS — I428 Other cardiomyopathies: Secondary | ICD-10-CM | POA: Insufficient documentation

## 2023-07-27 DIAGNOSIS — Q2381 Bicuspid aortic valve: Secondary | ICD-10-CM | POA: Insufficient documentation

## 2023-07-27 DIAGNOSIS — Z5986 Financial insecurity: Secondary | ICD-10-CM | POA: Diagnosis not present

## 2023-07-27 MED ORDER — BLOOD PRESSURE MONITOR MISC
0 refills | Status: AC
  Filled 2023-07-27: qty 1, 30d supply, fill #0

## 2023-07-27 NOTE — Patient Instructions (Addendum)
 Medication Changes:  HOLD Metoprolol for tomorrow.  HOLD Entresto until further notice.  HOLD Spironolactone until further notice.  You have been given a prescription for a blood pressure cuff today to monitor your blood pressure at home.   Lab Work:  Labs done today, your results will be available in MyChart, we will contact you for abnormal readings.   Testing/Procedures:  Your physician has requested that you have an echocardiogram. Echocardiography is a painless test that uses sound waves to create images of your heart. It provides your doctor with information about the size and shape of your heart and how well your heart's chambers and valves are working. This procedure takes approximately one hour. There are no restrictions for this procedure. Please do NOT wear cologne, perfume, aftershave, or lotions (deodorant is allowed). Please arrive 15 minutes prior to your appointment time.  Please note: We ask at that you not bring children with you during ultrasound (echo/ vascular) testing. Due to room size and safety concerns, children are not allowed in the ultrasound rooms during exams. Our front office staff cannot provide observation of children in our lobby area while testing is being conducted. An adult accompanying a patient to their appointment will only be allowed in the ultrasound room at the discretion of the ultrasound technician under special circumstances. We apologize for any inconvenience.  Please arrive at the [facility] on Tuesday April 29th at 10:45 AM for 11 AM test.    Special Instructions // Education:  Do the following things EVERYDAY: Weigh yourself in the morning before breakfast. Write it down and keep it in a log. Take your medicines as prescribed Eat low salt foods--Limit salt (sodium) to 2000 mg per day.  Stay as active as you can everyday Limit all fluids for the day to less than 2 liters   Follow-Up in: 2 weeks with Clarisa Kindred, FNP.    If  you have any questions or concerns before your next appointment please send [place] a message through [place] or call our office at [phone], If it is after office hours your call will be answered by our answering service and directed appropriately.     At the [facility], you and your health needs are our priority. We have a designated team specialized in the treatment of Heart Failure. This Care Team includes your primary Heart Failure Specialized Cardiologist (physician), Advanced Practice Providers (APPs- Physician Assistants and Nurse Practitioners), and Pharmacist who all work together to provide you with the care you need, when you need it.   You may see any of the following providers on your designated Care Team at your next follow up:  Dr. Arvilla Meres Dr. Marca Ancona Dr. Dorthula Nettles Dr. Theresia Bough Tonye Becket, NP Robbie Lis, [place] [address] [place], [place] Brynda Peon, NP [place], NP Clarisa Kindred, NP Enos Fling, PharmD

## 2023-07-28 LAB — BASIC METABOLIC PANEL WITH GFR
BUN/Creatinine Ratio: 13 (ref 9–20)
BUN: 20 mg/dL (ref 6–24)
CO2: 21 mmol/L (ref 20–29)
Calcium: 9.4 mg/dL (ref 8.7–10.2)
Chloride: 104 mmol/L (ref 96–106)
Creatinine, Ser: 1.54 mg/dL — ABNORMAL HIGH (ref 0.76–1.27)
Glucose: 83 mg/dL (ref 70–99)
Potassium: 4.3 mmol/L (ref 3.5–5.2)
Sodium: 139 mmol/L (ref 134–144)
eGFR: 53 mL/min/{1.73_m2} — ABNORMAL LOW (ref >59)

## 2023-07-28 LAB — MAGNESIUM: Magnesium: 1.9 mg/dL (ref 1.6–2.3)

## 2023-07-28 LAB — BRAIN NATRIURETIC PEPTIDE: BNP: 158.3 pg/mL — ABNORMAL HIGH (ref 0.0–100.0)

## 2023-07-29 ENCOUNTER — Encounter: Payer: Self-pay | Admitting: Family

## 2023-07-31 ENCOUNTER — Ambulatory Visit: Payer: MEDICAID | Admitting: Licensed Clinical Social Worker

## 2023-07-31 NOTE — Patient Instructions (Signed)
 Visit Information  Thank you for taking time to visit with me today. Please don't hesitate to contact me if I can be of assistance to you.   Following are the goals we discussed today:   Goals Addressed   None     Our next appointment is by telephone on 08/14/2023 at 10am  Please call the care guide team at [phone] if you need to cancel or reschedule your appointment.   If you are experiencing a Mental Health or Behavioral Health Crisis or need someone to talk to, please call 911   Patient verbalizes understanding of instructions and care plan provided today and agrees to view in MyChart. Active MyChart status and patient understanding of how to access instructions and care plan via MyChart confirmed with patient.     Telephone follow up appointment with care management team member scheduled for:08/14/2023   Gwyndolyn Saxon MSW, LCSW Licensed Clinical Social Worker  [facility], Population Health Direct Dial: [phone]  Fax: [phone]

## 2023-08-07 ENCOUNTER — Other Ambulatory Visit: Payer: Self-pay

## 2023-08-07 ENCOUNTER — Telehealth: Payer: Self-pay | Admitting: Family

## 2023-08-07 ENCOUNTER — Ambulatory Visit: Payer: Self-pay

## 2023-08-07 DIAGNOSIS — Z0389 Encounter for observation for other suspected diseases and conditions ruled out: Secondary | ICD-10-CM | POA: Diagnosis not present

## 2023-08-07 NOTE — Telephone Encounter (Incomplete)
 Called to confirm/remind patient of their appointment at the [facility] on 08/10/23***.   Appointment:   [x] Confirmed  [] Left mess   [] No answer/No voice mail  [] Phone not in service  Patient reminded to bring all medications and/or complete list.  Confirmed patient has transportation. Gave directions, instructed to utilize valet parking.
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
## 2023-08-10 ENCOUNTER — Encounter: Admitting: Family

## 2023-08-10 ENCOUNTER — Other Ambulatory Visit (HOSPITAL_COMMUNITY): Payer: Self-pay

## 2023-08-10 ENCOUNTER — Other Ambulatory Visit: Payer: Self-pay

## 2023-08-10 ENCOUNTER — Emergency Department (HOSPITAL_COMMUNITY): Payer: MEDICAID

## 2023-08-10 ENCOUNTER — Emergency Department (HOSPITAL_COMMUNITY)
Admission: EM | Admit: 2023-08-10 | Discharge: 2023-08-10 | Disposition: A | Discharge status: Home / Self Care | Payer: MEDICAID | Attending: Emergency Medicine | Admitting: Emergency Medicine

## 2023-08-10 DIAGNOSIS — J449 Chronic obstructive pulmonary disease, unspecified: Secondary | ICD-10-CM | POA: Diagnosis not present

## 2023-08-10 DIAGNOSIS — I5042 Chronic combined systolic (congestive) and diastolic (congestive) heart failure: Secondary | ICD-10-CM | POA: Diagnosis not present

## 2023-08-10 DIAGNOSIS — I130 Hypertensive heart and chronic kidney disease with heart failure and stage 1 through stage 4 chronic kidney disease, or unspecified chronic kidney disease: Secondary | ICD-10-CM | POA: Insufficient documentation

## 2023-08-10 DIAGNOSIS — R109 Unspecified abdominal pain: Secondary | ICD-10-CM | POA: Diagnosis present

## 2023-08-10 DIAGNOSIS — Z87891 Personal history of nicotine dependence: Secondary | ICD-10-CM | POA: Diagnosis not present

## 2023-08-10 DIAGNOSIS — Z79899 Other long term (current) drug therapy: Secondary | ICD-10-CM | POA: Insufficient documentation

## 2023-08-10 DIAGNOSIS — N182 Chronic kidney disease, stage 2 (mild): Secondary | ICD-10-CM | POA: Insufficient documentation

## 2023-08-10 DIAGNOSIS — E119 Type 2 diabetes mellitus without complications: Secondary | ICD-10-CM | POA: Diagnosis not present

## 2023-08-10 DIAGNOSIS — Z8616 Personal history of COVID-19: Secondary | ICD-10-CM | POA: Insufficient documentation

## 2023-08-10 DIAGNOSIS — I2510 Atherosclerotic heart disease of native coronary artery without angina pectoris: Secondary | ICD-10-CM | POA: Insufficient documentation

## 2023-08-10 LAB — BASIC METABOLIC PANEL WITH GFR
Anion gap: 10 (ref 5–15)
BUN: 23 mg/dL — ABNORMAL HIGH (ref 6–20)
CO2: 24 mmol/L (ref 22–32)
Calcium: 9.2 mg/dL (ref 8.9–10.3)
Chloride: 105 mmol/L (ref 98–111)
Creatinine, Ser: 1.51 mg/dL — ABNORMAL HIGH (ref 0.61–1.24)
GFR, Estimated: 55 mL/min — ABNORMAL LOW (ref >60)
Glucose, Bld: 85 mg/dL (ref 70–99)
Potassium: 5.2 mmol/L — ABNORMAL HIGH (ref 3.5–5.1)
Sodium: 139 mmol/L (ref 135–145)

## 2023-08-10 LAB — CBC
HCT: 45.8 % (ref 39.0–52.0)
Hemoglobin: 15.2 g/dL (ref 13.0–17.0)
MCH: 31.3 pg (ref 26.0–34.0)
MCHC: 33.2 g/dL (ref 30.0–36.0)
MCV: 94.4 fL (ref 80.0–100.0)
Platelets: 185 10*3/uL (ref 150–400)
RBC: 4.85 MIL/uL (ref 4.22–5.81)
RDW: 12.7 % (ref 11.5–15.5)
WBC: 3.2 10*3/uL — ABNORMAL LOW (ref 4.0–10.5)
nRBC: 0 % (ref 0.0–0.2)

## 2023-08-10 LAB — URINALYSIS, ROUTINE W REFLEX MICROSCOPIC
Bacteria, UA: NONE SEEN
Bilirubin Urine: NEGATIVE
Glucose, UA: >=500 mg/dL — AB
Hgb urine dipstick: NEGATIVE
Ketones, ur: NEGATIVE mg/dL
Leukocytes,Ua: NEGATIVE
Nitrite: NEGATIVE
Protein, ur: NEGATIVE mg/dL
Specific Gravity, Urine: 1.011 (ref 1.005–1.030)
pH: 5 (ref 5.0–8.0)

## 2023-08-10 MED ORDER — HYDROMORPHONE HCL 1 MG/ML IJ SOLN
1.0000 mg | Freq: Once | INTRAMUSCULAR | Status: AC
  Administered 2023-08-10: 1 mg via INTRAVENOUS
  Filled 2023-08-10: qty 1

## 2023-08-10 MED ORDER — METHOCARBAMOL 500 MG PO TABS
500.0000 mg | ORAL_TABLET | Freq: Two times a day (BID) | ORAL | 0 refills | Status: DC
  Filled 2023-08-10: qty 20, 10d supply, fill #0

## 2023-08-10 MED ORDER — SODIUM CHLORIDE 0.9 % IV BOLUS
1000.0000 mL | Freq: Once | INTRAVENOUS | Status: AC
  Administered 2023-08-10: 1000 mL via INTRAVENOUS
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
## 2023-08-10 NOTE — Discharge Instructions (Addendum)
 We evaluated you for your flank pain.  Your testing was reassuring including your CT scan.  Your symptoms may be related to a muscle spasm or muscle injury.  Please take Tylenol for your pain at home.  You can take it as milligrams of Tylenol every 6 hours as needed for pain.  We have also prescribed you a muscle relaxer.  You can also buy over-the-counter lidocaine patches and apply ice to your back.  Please follow-up closely with your primary doctor.  Please return if you develop any new or worsening symptoms such as severe uncontrolled pain, vomiting, lightheadedness or dizziness, fainting, or any other new symptoms.

## 2023-08-10 NOTE — Patient Outreach (Addendum)
 Complex Care Management   Collaboration Visit Note  08/10/2023  Name:  Darius [name] MRN: [id number] DOB: January 30, 1971  Situation: Referral received for Complex Care Management related to  right lower / flank back pain/ frequent falls      Background:   Past Medical History:  Diagnosis Date   Allergy    Anxiety    Aortic insufficiency due to bicuspid aortic valve 10/10/2021   Cardiac MRI 05/11/2021: Bicuspid aortic valve with fusion of the R & [place] cusps with severe AI (regurgitant fraction of 59%.   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  Severe AI 2/2 Bicusid AoV (R&[place] cusp fusion). Ascending Ao 42 mm   Valvular heart disease    a.) TTE on 12/19/2017 --> mild MV regurgitation; moderate AV regurgitation    Assessment: Patient Reported Symptoms:  Cognitive        Neurological      HEENT        Cardiovascular      Respiratory      Endocrine      Gastrointestinal        Genitourinary      Integumentary      Musculoskeletal Musculoskelatal Symptoms Reviewed: Other (New onset frequent falls.) Other Musculoskeletal Symptoms: Telephone call to arrange follow up visit with primary care provider due to frequent falls and reported right low back/ flank pain.   Contact made with Evette who states she will send note to provider and providers nurse requesting an appointment date for patient. Per chart review patient  currently in the ED.        Psychosocial           08/10/23  Return call received from [place] with patient access.  She states she contacted patient and found him to be in the emergency department.  Grafton Lawrence states she advised patient that the primary provider office will wait to schedule follow up appointment after patient has been evaluated in the ED to determine plan of care.         08/07/2023   11:59 AM  Depression screen PHQ 2/9  Decreased Interest 0  Down, Depressed, Hopeless 1  PHQ - 2 Score 1    There were no vitals filed for this visit.  Medications Reviewed Today   Medications were not reviewed in this encounter     Recommendation:   PCP Follow-up  Follow Up Plan:   Telephone follow-up in 1 month with RN care manager  Verba Girt RN, BSN, CCM [place]  [facility], Population Health Case Manager Phone: [phone]
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
## 2023-08-13 ENCOUNTER — Other Ambulatory Visit (HOSPITAL_COMMUNITY): Payer: Self-pay

## 2023-08-14 ENCOUNTER — Ambulatory Visit: Payer: Self-pay | Admitting: Licensed Clinical Social Worker
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
## 2023-08-14 NOTE — Patient Instructions (Signed)
 Visit Information  Thank you for taking time to visit with me today. Please don't hesitate to contact me if I can be of assistance to you before our next scheduled appointment.  Your next care management appointment is by telephone on 09/04/2023 at 10am  Face to Face follow up 09/04/2023  Please call the care guide team at [phone] if you need to cancel, schedule, or reschedule an appointment.   Please call 911 if you are experiencing a Mental Health or Behavioral Health Crisis or need someone to talk to.  Fletcher Humble MSW, LCSW Licensed Clinical Social Worker  [facility], Population Health Direct Dial: [phone]  Fax: [phone]

## 2023-08-18 ENCOUNTER — Encounter: Payer: Self-pay | Admitting: Family

## 2023-08-20 ENCOUNTER — Telehealth: Payer: Self-pay
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
## 2023-08-24 ENCOUNTER — Telehealth: Payer: Self-pay | Admitting: Family

## 2023-08-24 NOTE — Telephone Encounter (Signed)
 Called to confirm/remind patient of their appointment at the [facility] on 08/25/23.   Appointment:   [x] Confirmed  [] Left mess   [] No answer/No voice mail  [] VM Full/unable to leave message  [] Phone not in service  Patient reminded to bring all medications and/or complete list.  Confirmed patient has transportation. Gave directions, instructed to utilize valet parking.
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
## 2023-08-25 ENCOUNTER — Encounter: Payer: Self-pay | Admitting: Family

## 2023-08-25 ENCOUNTER — Ambulatory Visit: Payer: MEDICAID | Attending: Family | Admitting: Family

## 2023-08-25 ENCOUNTER — Ambulatory Visit: Admission: RE | Admit: 2023-08-25 | Payer: MEDICAID | Source: Ambulatory Visit

## 2023-08-25 VITALS — BP 123/58 | HR 68 | Wt 184.0 lb

## 2023-08-25 DIAGNOSIS — I428 Other cardiomyopathies: Secondary | ICD-10-CM | POA: Insufficient documentation

## 2023-08-25 DIAGNOSIS — I351 Nonrheumatic aortic (valve) insufficiency: Secondary | ICD-10-CM | POA: Diagnosis not present

## 2023-08-25 DIAGNOSIS — F1910 Other psychoactive substance abuse, uncomplicated: Secondary | ICD-10-CM

## 2023-08-25 DIAGNOSIS — I10 Essential (primary) hypertension: Secondary | ICD-10-CM

## 2023-08-25 DIAGNOSIS — Q2381 Bicuspid aortic valve: Secondary | ICD-10-CM | POA: Diagnosis not present

## 2023-08-25 DIAGNOSIS — F1411 Cocaine abuse, in remission: Secondary | ICD-10-CM | POA: Insufficient documentation

## 2023-08-25 DIAGNOSIS — E119 Type 2 diabetes mellitus without complications: Secondary | ICD-10-CM | POA: Insufficient documentation

## 2023-08-25 DIAGNOSIS — Z7984 Long term (current) use of oral hypoglycemic drugs: Secondary | ICD-10-CM | POA: Insufficient documentation

## 2023-08-25 DIAGNOSIS — I110 Hypertensive heart disease with heart failure: Secondary | ICD-10-CM | POA: Insufficient documentation

## 2023-08-25 DIAGNOSIS — F1211 Cannabis abuse, in remission: Secondary | ICD-10-CM | POA: Insufficient documentation

## 2023-08-25 DIAGNOSIS — Q231 Congenital insufficiency of aortic valve: Secondary | ICD-10-CM | POA: Diagnosis not present

## 2023-08-25 DIAGNOSIS — Z87891 Personal history of nicotine dependence: Secondary | ICD-10-CM | POA: Insufficient documentation

## 2023-08-25 DIAGNOSIS — F1010 Alcohol abuse, uncomplicated: Secondary | ICD-10-CM | POA: Insufficient documentation

## 2023-08-25 DIAGNOSIS — R0602 Shortness of breath: Secondary | ICD-10-CM | POA: Diagnosis present

## 2023-08-25 DIAGNOSIS — I5022 Chronic systolic (congestive) heart failure: Secondary | ICD-10-CM | POA: Diagnosis present

## 2023-08-25 DIAGNOSIS — I7121 Aneurysm of the ascending aorta, without rupture: Secondary | ICD-10-CM | POA: Diagnosis not present

## 2023-08-25 NOTE — Progress Notes (Signed)
 [facility] - HEART FAILURE CLINIC - PHARMACIST COUNSELING NOTE  Guideline-Directed Medical Therapy/Evidence Based Medicine  ACE/ARB/ARNI: Sacubitril -valsartan  24-26 mg twice daily Beta Blocker: Metoprolol  tartrate 25 mg twice daily  Aldosterone Antagonist: Spironolactone  12.5 mg daily   holding due to low BP, last K = 5.2  Diuretic: Furosemide  10 mg daily SGLT2i: Empagliflozin  10 mg daily  Adherence Assessment  Darius [name] you ever forget to take your medication? [] Yes [x] No  Darius [name] you ever skip doses due to side effects? [] Yes [x] No  Darius [name] you have trouble affording your medicines? [] Yes [x] No  Are you ever unable to pick up your medication due to transportation difficulties? [] Yes [x] No  Darius [name] you ever stop taking your medications because you don't believe they are helping? [] Yes [x] No  Darius [name] you check your weight daily? [x] Yes [] No   Adherence strategy:  -- Forgets 1-2 time per week forgets to take the AM medications  -- Patient reports they are paying $4 per bottle but previously they were paying $4   Medication side effects -- Now they are feeling better since stopping the metoprolol  and the spironolactone    Barriers to obtaining medications: No barriers identified at this time  Vital signs: HR 68, BP 123/58, (PTA last one 120/65)  -- weight (pounds) 184.0 (baseline 184.6 lbs, sometimes they drop to 180 lbs) Echo 11/2017: EF 60-65%  Echo 5/23: EF 40-45%  08/10/23 GFR 44     Latest Ref Rng & Units 08/10/2023    9:17 AM 07/27/2023   11:04 AM 07/14/2023   10:46 AM  BMP  Glucose 70 - 99 mg/dL 85  83  88   BUN 6 - 20 mg/dL 23  20  18    Creatinine 0.61 - 1.24 mg/dL 1.61  0.96  0.45   BUN/Creat Ratio 9 - 20  13  13    Sodium 135 - 145 mmol/L 139  139  142   Potassium 3.5 - 5.1 mmol/L 5.2  4.3  5.0   Chloride 98 - 111 mmol/L 105  104  104   CO2 22 - 32 mmol/L 24  21    Calcium 8.9 - 10.3 mg/dL 9.2  9.4  9.6    Past Medical History:  Diagnosis Date   Allergy     Anxiety    Aortic insufficiency due to bicuspid aortic valve 10/10/2021   Cardiac MRI 05/11/2021: Bicuspid aortic valve with fusion of the R & [place] cusps with severe AI (regurgitant fraction of 59%.   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  Severe AI 2/2 Bicusid AoV (R&[place] cusp fusion). Ascending Ao 42 mm   Valvular heart disease    a.) TTE on 12/19/2017 --> mild MV regurgitation; moderate AV regurgitation   ASSESSMENT Darius Darius [name] is a 53 year old male who presents to the HF clinic for their 2 week follow-up appointment. They have a past medical history significant for  chronic systolic CHF/NICM, bicuspid aortic valve with severe AI, polysubstance abuse (cocaine, ETOH, tobacco, marijuana), T2DM. At their previous appointment, they were instructed to hold their Entresto  and spironolactone  until further notice due to hypotension and hyperkalemia.   While reviewing medications with this patient, they reported they are still taking the Entresto . When review symptoms, patient denied any dizziness, shortness of breath, no changes in appetite or signs of edema. They did report every so often they experience flashes of what they call "panic attacks" that take their breath away but only last for seconds.  Recent ED Visit (past 6 months):  -- Date - 08/10/2023, CC - Right Flank Pain -- Date - 06/02/2023, CC - Polysubstance abuse -- Date - 05/19/2023, CC -  Polysubstance abuse  PLAN  Preventative 05/14/2023 LDL 88 -- Patient not on statin at this time  Recommendations -- Continuing to hold spirolactone due to extensive history of hypotension (last K = 5.9) -- Recommend patient can continue on their Entresto  since they Darius [name] not have hypotension today or reports of hypotension at home. -- In the future could consider adding a statin   Time spent: 15 minutes  Darius Darius [name], PharmD Pharmacy Resident  08/25/2023 1:41 PM  Current Outpatient Medications:    Blood Pressure Monitor MISC, Use as directed., Disp: 1 each, Rfl: 0   Blood Pressure Monitoring (BLOOD PRESSURE CUFF) MISC, 1 Units by Does not apply route daily. (Patient not taking: Reported on 07/27/2023), Disp: 1 each, Rfl: 0   empagliflozin  (JARDIANCE ) 10 MG TABS tablet, Take 1 tablet (10 mg total) by mouth daily before breakfast., Disp: 30 tablet, Rfl: 1   furosemide  (LASIX ) 20 MG tablet, Take 1/2 tablet (10 mg total) by mouth daily., Disp: 15 tablet, Rfl: 1   gabapentin  (NEURONTIN ) 100 MG capsule, Take 1 capsule (100 mg total) by mouth at bedtime. (Patient not taking: Reported on 08/07/2023), Disp: 30 capsule, Rfl: 1   methocarbamol  (ROBAXIN ) 500 MG tablet, Take 1 tablet (500 mg total) by mouth 2 (two) times daily., Disp: 20 tablet, Rfl: 0   metoprolol  succinate (TOPROL -XL) 25 MG 24 hr tablet, Take 0.5 tablets (12.5 mg total) by mouth daily., Disp: 15 tablet, Rfl: 1   sacubitril -valsartan  (ENTRESTO ) 24-26 MG, Take 1/2 (half) tablet by mouth 2 (two) times daily. (Patient not taking: Reported on 08/07/2023), Disp: 30 tablet, Rfl: 1   spironolactone  (ALDACTONE ) 25 MG tablet, Take 1/2 tablet (12.5 mg total) by mouth daily., Disp: 15 tablet, Rfl: 1

## 2023-08-25 NOTE — Patient Instructions (Addendum)
 Lab Work:  Go DOWN to LOWER LEVEL (LL) to have your blood work completed inside of [employer] office.  We will only call you if the results are abnormal or if the provider would like to make medication changes.   Special Instructions // Education:  COMPLETE YOUR ECHO FRIDAY, JUNE 20th AT 11:00 AM.  You will check in for this at the [facility]. You have to arrive 15 MINS EARLY for preparation, otherwise you will have to reschedule.     Follow-Up on: THURSDAY, JUNE 19th AT 2:45 AM.  At the [facility], you and your health needs are our priority. We have a designated team specialized in the treatment of Heart Failure. This Care Team includes your primary Heart Failure Specialized Cardiologist (physician), Advanced Practice Providers (APPs- Physician Assistants and Nurse Practitioners), and Pharmacist who all work together to provide you with the care you need, when you need it.   You may see any of the following providers on your designated Care Team at your next follow up:  Dr. Jules Oar Dr. Peder Bourdon Dr. Alwin Baars Dr. Judyth Nunnery Shawnee Dellen, FNP Bevely Brush, RPH-CPP  Please be sure to bring in all your medications bottles to every appointment.   Need to Contact Us :  If you have any questions or concerns before your next appointment please send us  a message through [facility] or call our office at [phone].    TO LEAVE A MESSAGE FOR THE NURSE SELECT OPTION 2, PLEASE LEAVE A MESSAGE INCLUDING: YOUR NAME DATE OF BIRTH CALL BACK NUMBER REASON FOR CALL**this is important as we prioritize the call backs  YOU WILL RECEIVE A CALL BACK THE SAME DAY AS LONG AS YOU CALL BEFORE 4:00 PM

## 2023-08-26 ENCOUNTER — Encounter: Payer: Self-pay | Admitting: Family

## 2023-08-26 LAB — BASIC METABOLIC PANEL WITH GFR
BUN/Creatinine Ratio: 12 (ref 9–20)
BUN: 15 mg/dL (ref 6–24)
CO2: 22 mmol/L (ref 20–29)
Calcium: 9.7 mg/dL (ref 8.7–10.2)
Chloride: 106 mmol/L (ref 96–106)
Creatinine, Ser: 1.24 mg/dL (ref 0.76–1.27)
Glucose: 83 mg/dL (ref 70–99)
Potassium: 4.1 mmol/L (ref 3.5–5.2)
Sodium: 144 mmol/L (ref 134–144)
eGFR: 69 mL/min/{1.73_m2} (ref >59)

## 2023-08-27 ENCOUNTER — Telehealth: Payer: Self-pay

## 2023-08-27 ENCOUNTER — Inpatient Hospital Stay: Payer: MEDICAID | Admitting: Family Medicine

## 2023-08-27 NOTE — Telephone Encounter (Signed)
 Called to follow up with patient in regards to medication. Received a prior Authorization for Jardiance  10 mg tablets on 04.23.2025, patient states he has started medication, and his chart indicates that medication was dispensed by pharmacy on 03.24.2025.   Patient is also due for follow up, he agrees to follow up with the unit next Tuesday 05.06.2025, Wednesday 05.07.2025.   With patients approval I will text him the address of our locations for next week.

## 2023-08-31 ENCOUNTER — Ambulatory Visit: Admitting: Orthopedic Surgery

## 2023-09-02 ENCOUNTER — Telehealth: Payer: Self-pay

## 2023-09-02 ENCOUNTER — Encounter (HOSPITAL_COMMUNITY): Payer: Self-pay

## 2023-09-03 DIAGNOSIS — Z0389 Encounter for observation for other suspected diseases and conditions ruled out: Secondary | ICD-10-CM | POA: Diagnosis not present

## 2023-09-04 ENCOUNTER — Other Ambulatory Visit: Payer: MEDICAID | Admitting: Licensed Clinical Social Worker

## 2023-09-04 ENCOUNTER — Other Ambulatory Visit: Payer: MEDICAID

## 2023-09-04 NOTE — Patient Outreach (Signed)
 Complex Care Management   Visit Note  09/04/2023  Name:  Darius [name] MRN: [id number] DOB: October 25, 1970  Situation: Referral received for Complex Care Management related to SDOH Barriers:  Food insecurity Financial Resource Strain I obtained verbal consent from Patient.  Visit completed with Darius [name]   on the phone  Background:   Past Medical History:  Diagnosis Date   Allergy    Anxiety    Aortic insufficiency due to bicuspid aortic valve 10/10/2021   Cardiac MRI 05/11/2021: Bicuspid aortic valve with fusion of the R & [place] cusps with severe AI (regurgitant fraction of 59%.   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  Severe AI 2/2 Bicusid AoV (R&[place] cusp fusion). Ascending Ao 42 mm   Valvular heart disease    a.) TTE on 12/19/2017 --> mild MV regurgitation; moderate AV regurgitation    Assessment: Patient Reported Symptoms:  Cognitive Cognitive Status: Able to follow simple commands, Alert and oriented to person, place, and time      Neurological Neurological Review of Symptoms: No symptoms reported    HEENT HEENT Symptoms Reported: No symptoms reported      Cardiovascular Cardiovascular Symptoms Reported: Chest pain or discomfort Cardiovascular Comment: Pt encouraged to contact provider office regarding chest and/or back discomfort  Respiratory Respiratory Symptoms Reported: No symptoms reported    Endocrine Patient  reports the following symptoms related to hypoglycemia or hyperglycemia : No symptoms reported    Gastrointestinal Gastrointestinal Symptoms Reported: No symptoms reported      Genitourinary Genitourinary Symptoms Reported: No symptoms reported    Integumentary Integumentary Symptoms Reported: No symptoms reported    Musculoskeletal          Psychosocial       Do you feel physically threatened by others?: No      09/04/2023   11:32 PM  Depression screen PHQ 2/9  Decreased Interest 0  Down, Depressed, Hopeless 1  PHQ - 2 Score 1    There were no vitals filed for this visit.  Medications Reviewed Today     Reviewed by Fletcher Humble, LCSW (Social Worker) on 09/04/23 at 2327  Med List Status: <None>   Medication Order Taking? Sig Documenting Provider Last Dose Status Informant  Blood Pressure Monitor MISC [id number]  Use as directed. Shawnee Dellen A, FNP  Active   Blood Pressure Monitoring (BLOOD PRESSURE CUFF) MISC [id number] No 1 Units by Does not apply route daily.  Patient not taking: Reported on 07/27/2023   [name], Darius S, PA-C Not Taking Active   empagliflozin  (JARDIANCE ) 10 MG TABS tablet [id number] No Take 1 tablet (10 mg total) by mouth daily before breakfast. [name], Darius S, PA-C Taking Active   furosemide  (LASIX ) 20 MG tablet [id number] No Take 1/2 tablet (10 mg total) by mouth daily. [name], Darius Hermann, PA-C Taking Active   gabapentin  (NEURONTIN ) 100 MG capsule [id number] No Take 1  capsule (100 mg total) by mouth at bedtime.  Patient not taking: Reported on 08/07/2023   [name], Darius Hermann, PA-C Not Taking Active   methocarbamol  (ROBAXIN ) 500 MG tablet [id number]  Take 1 tablet (500 mg total) by mouth 2 (two) times daily. Darius Applebaum, MD  Active            Med Note Idalia Mais, Darius [name]   Tue Aug 25, 2023  1:59 PM) Patient still taking, but it makes them breath heavy  sacubitril -valsartan  (ENTRESTO ) 24-26 MG [id number] No Take 1/2 (half) tablet by mouth 2 (two) times daily.   Patient taking differently: Take 1 tablet by mouth daily.   [name], Darius S, PA-C Taking Active   spironolactone  (ALDACTONE ) 25 MG tablet [id number] No Take 1/2 tablet (12.5 mg total) by mouth daily.  Patient not taking: Reported on 08/25/2023   Malcom Scriver, PA-C Not Taking Active             Recommendation:   PCP Follow-up Darius [name] encouraged to contact provider office to any chest/back discomfort. Darius [name] have a pcp followed scheduled for 09/09/2023. Darius [name] encouraged to engaged with free/low cost dental care with [facility] (pt given info) and also contact medicaid caseworker to have medicaid plan changed due to dental provider in pt local area not accepting pt medicaid plan. Pt Darius [name] reports an slight improvement in mood and feels secure with food, housing and transportation.  Follow Up Plan:   Patient has met all care management goals. Care Management case will be closed. Patient has been provided contact information should new needs arise.   Fletcher Humble MSW, LCSW Licensed Clinical Social Worker  [facility], Population Health Direct Dial: [phone]  Fax: [phone]

## 2023-09-04 NOTE — Patient Instructions (Signed)

## 2023-09-09 ENCOUNTER — Ambulatory Visit: Payer: MEDICAID | Admitting: Family Medicine

## 2023-09-09 ENCOUNTER — Ambulatory Visit: Payer: Self-pay | Admitting: *Deleted

## 2023-09-09 NOTE — Telephone Encounter (Signed)
 Called office- per CAL- insurance copay is $4. Attempted to call patient to see if he wants to reschedule or make different care plan. No answer- left message to call back.     Copied from CRM [id number]. Topic: General - Registration Update >> Sep 09, 2023  9:31 AM Ivette P wrote: Patient calling in about insurance being switched and not being able to make appointment today due not being able to cover cost. Pt would like to know if there is any help that can be given to him from his provider Judyann Number on insurance and what options he has available. Pt was switched to [employer] and was not aware of this change.    PT callback [phone]

## 2023-09-09 NOTE — Telephone Encounter (Signed)
 Per chart review, pt canceled appt and spoke to clinic.

## 2023-09-17 ENCOUNTER — Other Ambulatory Visit (HOSPITAL_COMMUNITY): Payer: Self-pay

## 2023-09-17 ENCOUNTER — Other Ambulatory Visit: Payer: Self-pay | Admitting: Family Medicine

## 2023-09-17 DIAGNOSIS — I5032 Chronic diastolic (congestive) heart failure: Secondary | ICD-10-CM

## 2023-09-17 MED ORDER — FUROSEMIDE 20 MG PO TABS
10.0000 mg | ORAL_TABLET | Freq: Every day | ORAL | 1 refills | Status: DC
  Filled 2023-09-17: qty 15, 30d supply, fill #0
  Filled 2023-09-23 – 2023-10-13 (×2): qty 15, 30d supply, fill #1

## 2023-09-18 ENCOUNTER — Other Ambulatory Visit (HOSPITAL_COMMUNITY): Payer: Self-pay

## 2023-09-18 ENCOUNTER — Encounter (HOSPITAL_COMMUNITY): Payer: Self-pay

## 2023-09-22 ENCOUNTER — Other Ambulatory Visit: Payer: Self-pay

## 2023-09-22 ENCOUNTER — Other Ambulatory Visit (HOSPITAL_COMMUNITY): Payer: Self-pay

## 2023-09-22 DIAGNOSIS — I5032 Chronic diastolic (congestive) heart failure: Secondary | ICD-10-CM

## 2023-09-22 MED ORDER — EMPAGLIFLOZIN 10 MG PO TABS
10.0000 mg | ORAL_TABLET | Freq: Every day | ORAL | 5 refills | Status: DC
  Filled 2023-09-22 – 2023-09-23 (×2): qty 30, 30d supply, fill #0
  Filled 2023-10-13 – 2023-10-19 (×3): qty 30, 30d supply, fill #1

## 2023-09-22 NOTE — Progress Notes (Signed)
 Patient called requesting refills on his Jardiance .

## 2023-09-23 ENCOUNTER — Telehealth: Payer: Self-pay

## 2023-09-23 ENCOUNTER — Other Ambulatory Visit: Payer: Self-pay | Admitting: Physician Assistant

## 2023-09-23 ENCOUNTER — Other Ambulatory Visit (HOSPITAL_COMMUNITY): Payer: Self-pay

## 2023-09-23 NOTE — Telephone Encounter (Signed)
 Advanced Heart Failure Patient Advocate Encounter  Prior authorization for Jardiance  has been submitted and approved. Test billing returns $4 for 90 day supply.  Key: [id number] Effective: 09/23/2023 to 09/22/2024  Kennis Peacock, CPhT Rx Patient Advocate Phone: [phone]

## 2023-09-24 ENCOUNTER — Other Ambulatory Visit (HOSPITAL_COMMUNITY): Payer: Self-pay

## 2023-09-24 ENCOUNTER — Other Ambulatory Visit: Payer: Self-pay

## 2023-09-28 ENCOUNTER — Encounter: Payer: MEDICAID | Admitting: Internal Medicine

## 2023-09-28 ENCOUNTER — Other Ambulatory Visit: Payer: Self-pay
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
## 2023-10-07 ENCOUNTER — Encounter: Payer: Self-pay | Admitting: Family Medicine

## 2023-10-07 ENCOUNTER — Ambulatory Visit (INDEPENDENT_AMBULATORY_CARE_PROVIDER_SITE_OTHER): Admitting: Family Medicine

## 2023-10-07 VITALS — BP 101/59 | HR 52 | Temp 97.5°F | Ht 74.0 in | Wt 183.0 lb

## 2023-10-07 DIAGNOSIS — K029 Dental caries, unspecified: Secondary | ICD-10-CM | POA: Diagnosis not present

## 2023-10-07 DIAGNOSIS — M792 Neuralgia and neuritis, unspecified: Secondary | ICD-10-CM | POA: Diagnosis not present

## 2023-10-07 DIAGNOSIS — M545 Low back pain, unspecified: Secondary | ICD-10-CM

## 2023-10-07 DIAGNOSIS — Z87828 Personal history of other (healed) physical injury and trauma: Secondary | ICD-10-CM

## 2023-10-07 DIAGNOSIS — Q231 Congenital insufficiency of aortic valve: Secondary | ICD-10-CM | POA: Diagnosis not present

## 2023-10-07 DIAGNOSIS — K219 Gastro-esophageal reflux disease without esophagitis: Secondary | ICD-10-CM

## 2023-10-07 DIAGNOSIS — Q2381 Bicuspid aortic valve: Secondary | ICD-10-CM | POA: Diagnosis not present

## 2023-10-07 DIAGNOSIS — G8929 Other chronic pain: Secondary | ICD-10-CM

## 2023-10-13 ENCOUNTER — Other Ambulatory Visit (HOSPITAL_COMMUNITY): Payer: Self-pay

## 2023-10-14 ENCOUNTER — Other Ambulatory Visit: Payer: Self-pay | Admitting: Physician Assistant

## 2023-10-14 ENCOUNTER — Telehealth: Payer: Self-pay

## 2023-10-14 ENCOUNTER — Other Ambulatory Visit (HOSPITAL_COMMUNITY): Payer: Self-pay

## 2023-10-14 NOTE — Telephone Encounter (Signed)
 fyi

## 2023-10-14 NOTE — Telephone Encounter (Signed)
 Copied from CRM [id number]. Topic: Clinical - Medication Question >> Oct 14, 2023  3:37 PM Sasha H wrote: Reason for CRM: [name] pharmacy called over stating pt came in stating he was seen on 6/11 and a pain med and antibiotic was supposed to be sent over, but they do not have anything available for him. The provider line is [phone]

## 2023-10-15 ENCOUNTER — Other Ambulatory Visit: Payer: Self-pay | Admitting: Family Medicine

## 2023-10-15 ENCOUNTER — Other Ambulatory Visit: Payer: Self-pay | Admitting: Physician Assistant

## 2023-10-15 ENCOUNTER — Telehealth: Payer: Self-pay

## 2023-10-15 ENCOUNTER — Other Ambulatory Visit (HOSPITAL_COMMUNITY): Payer: Self-pay

## 2023-10-15 ENCOUNTER — Encounter (HOSPITAL_COMMUNITY): Payer: Self-pay

## 2023-10-15 ENCOUNTER — Encounter: Payer: Self-pay | Admitting: Family Medicine

## 2023-10-15 ENCOUNTER — Encounter: Payer: MEDICAID | Admitting: Internal Medicine

## 2023-10-15 DIAGNOSIS — I5032 Chronic diastolic (congestive) heart failure: Secondary | ICD-10-CM

## 2023-10-15 MED ORDER — PREGABALIN 75 MG PO CAPS
75.0000 mg | ORAL_CAPSULE | Freq: Two times a day (BID) | ORAL | 1 refills | Status: DC
  Filled 2023-10-15: qty 60, 30d supply, fill #0
  Filled 2023-12-21: qty 60, 30d supply, fill #1

## 2023-10-15 MED ORDER — ENTRESTO 24-26 MG PO TABS
ORAL_TABLET | ORAL | 0 refills | Status: DC
  Filled 2023-10-15: qty 30, 30d supply, fill #0

## 2023-10-15 MED ORDER — FLUCONAZOLE 150 MG PO TABS
150.0000 mg | ORAL_TABLET | Freq: Every day | ORAL | 0 refills | Status: AC
  Filled 2023-10-15: qty 7, 7d supply, fill #0

## 2023-10-15 NOTE — Telephone Encounter (Unsigned)
 Copied from CRM [id number]. Topic: Clinical - Medication Refill >> Oct 15, 2023  9:03 AM Oddis Bench wrote: Medication: furosemide  (LASIX ) 20 MG tablet, empagliflozin  (JARDIANCE ) 10 MG TABS tablet, methocarbamol  (ROBAXIN ) 500 MG tablet fluconazole (DIFLUCAN) 150 MG tablet sacubitril -valsartan  (ENTRESTO ) 24-26 MG  Has the patient contacted their pharmacy? Yes Need to call in  This is the patient's preferred pharmacy:    [facility] [address] [place] [address] Phone: [phone] Fax: [phone]    Is this the correct pharmacy for this prescription? Yes If no, delete pharmacy and type the correct one.   Has the prescription been filled recently? Yes  Is the patient out of the medication? Yes  Has the patient been seen for an appointment in the last year OR does the patient have an upcoming appointment? Yes  Can we respond through MyChart? No  Agent: Please be advised that Rx refills may take up to 3 business days. We ask that you follow-up with your pharmacy.

## 2023-10-15 NOTE — Telephone Encounter (Signed)
 Advanced Heart Failure Patient Advocate Encounter  Prior authorization for Jardiance  has been submitted and approved. Test billing returns $4 for 90 day supply.  Key: [id number] Effective: 10/15/2023 to 10/14/2024  Kennis Peacock, CPhT Rx Patient Advocate Phone: [phone]

## 2023-10-16 ENCOUNTER — Ambulatory Visit
Admission: RE | Admit: 2023-10-16 | Discharge: 2023-10-16 | Disposition: A | Discharge status: Home / Self Care | Payer: MEDICAID | Source: Ambulatory Visit | Attending: Family | Admitting: Family

## 2023-10-16 DIAGNOSIS — I080 Rheumatic disorders of both mitral and aortic valves: Secondary | ICD-10-CM | POA: Diagnosis not present

## 2023-10-16 DIAGNOSIS — I5022 Chronic systolic (congestive) heart failure: Secondary | ICD-10-CM

## 2023-10-16 LAB — ECHOCARDIOGRAM COMPLETE
AR max vel: 1.13 cm2
AV Area VTI: 1.17 cm2
AV Area mean vel: 0.99 cm2
AV Mean grad: 10.7 mmHg
AV Peak grad: 17.4 mmHg
Ao pk vel: 2.09 m/s
Area-P 1/2: 3.28 cm2
P 1/2 time: 341 ms
S' Lateral: 3.7 cm

## 2023-10-16 NOTE — Progress Notes (Signed)
*  PRELIMINARY RESULTS* Echocardiogram 2D Echocardiogram has been performed.  Darius [name] 10/16/2023, 11:54 AM

## 2023-10-16 NOTE — Telephone Encounter (Signed)
 Requested medications are due for refill today.  no  Requested medications are on the active medications list.  yes  Last refill. varied  Future visit scheduled.   yes  Notes to clinic.  Pt is requesting early refills d/t leaving rehab.  Please review. Robaxin  not delegated. Diflucan does not have  a protocol.    Requested Prescriptions  Pending Prescriptions Disp Refills   furosemide  (LASIX ) 20 MG tablet 15 tablet 1    Sig: Take 1/2 tablet (10 mg total) by mouth daily.     Cardiovascular:  Diuretics - Loop Failed - 10/16/2023  5:07 PM      Failed - Valid encounter within last 6 months    Recent Outpatient Visits           1 week ago Gastroesophageal reflux disease, unspecified whether esophagitis present   [facility] Lamon Pillow, MD              Passed - K in normal range and within 180 days    Potassium  Date Value Ref Range Status  08/25/2023 4.1 3.5 - 5.2 mmol/L Final         Passed - Ca in normal range and within 180 days    Calcium  Date Value Ref Range Status  08/25/2023 9.7 8.7 - 10.2 mg/dL Final   Calcium, Ion  Date Value Ref Range Status  12/03/2021 1.30 1.15 - 1.40 mmol/L Final         Passed - Na in normal range and within 180 days    Sodium  Date Value Ref Range Status  08/25/2023 144 134 - 144 mmol/L Final         Passed - Cr in normal range and within 180 days    Creatinine, Ser  Date Value Ref Range Status  08/25/2023 1.24 0.76 - 1.27 mg/dL Final         Passed - Cl in normal range and within 180 days    Chloride  Date Value Ref Range Status  08/25/2023 106 96 - 106 mmol/L Final         Passed - Mg Level in normal range and within 180 days    Magnesium  Date Value Ref Range Status  07/27/2023 1.9 1.6 - 2.3 mg/dL Final         Passed - Last BP in normal range    BP Readings from Last 1 Encounters:  10/07/23 (!) 101/59          methocarbamol  (ROBAXIN ) 500 MG tablet 20 tablet 0    Sig: Take 1  tablet (500 mg total) by mouth 2 (two) times daily.     Not Delegated - Analgesics:  Muscle Relaxants Failed - 10/16/2023  5:07 PM      Failed - This refill cannot be delegated      Failed - Valid encounter within last 6 months    Recent Outpatient Visits           1 week ago Gastroesophageal reflux disease, unspecified whether esophagitis present   [facility] Lamon Pillow, MD               fluconazole (DIFLUCAN) 150 MG tablet 7 tablet 0    Sig: Take 1 tablet (150 mg total) by mouth daily for 7 doses.     Off-Protocol Failed - 10/16/2023  5:07 PM      Failed - Medication not assigned to a protocol, review  manually.      Failed - Valid encounter within last 12 months    Recent Outpatient Visits           1 week ago Gastroesophageal reflux disease, unspecified whether esophagitis present   [facility] Lamon Pillow, MD               empagliflozin  (JARDIANCE ) 10 MG TABS tablet 30 tablet 5    Sig: Take 1 tablet (10 mg total) by mouth daily before breakfast.     Endocrinology:  Diabetes - SGLT2 Inhibitors Failed - 10/16/2023  5:07 PM      Failed - Valid encounter within last 6 months    Recent Outpatient Visits           1 week ago Gastroesophageal reflux disease, unspecified whether esophagitis present   [facility] Lamon Pillow, MD              Passed - Cr in normal range and within 360 days    Creatinine, Ser  Date Value Ref Range Status  08/25/2023 1.24 0.76 - 1.27 mg/dL Final         Passed - HBA1C is between 0 and 7.9 and within 180 days    Hgb A1c MFr Bld  Date Value Ref Range Status  05/14/2023 4.8 4.8 - 5.6 % Final    Comment:             Prediabetes: 5.7 - 6.4          Diabetes: >6.4          Glycemic control for adults with diabetes: <7.0          Passed - eGFR in normal range and within 360 days    GFR calc Af Amer  Date Value Ref Range Status   11/23/2019 >60 >60 mL/min Final   GFR, Estimated  Date Value Ref Range Status  08/10/2023 55 (L) >60 mL/min Final    Comment:    (NOTE) Calculated using the CKD-EPI Creatinine Equation (2021)    eGFR  Date Value Ref Range Status  08/25/2023 69 >59 mL/min/1.73 Final         Signed Prescriptions Disp Refills   sacubitril -valsartan  (ENTRESTO ) 24-26 MG 30 tablet 0    Sig: Take 1/2 tablet by mouth twice a day     Off-Protocol Failed - 10/16/2023  5:07 PM      Failed - Medication not assigned to a protocol, review manually.      Failed - Valid encounter within last 12 months    Recent Outpatient Visits           1 week ago Gastroesophageal reflux disease, unspecified whether esophagitis present   [facility] Lamon Pillow, MD

## 2023-10-19 ENCOUNTER — Other Ambulatory Visit: Payer: Self-pay

## 2023-10-20 ENCOUNTER — Other Ambulatory Visit (HOSPITAL_COMMUNITY): Payer: Self-pay

## 2023-10-20 ENCOUNTER — Encounter: Payer: Self-pay | Admitting: Family Medicine

## 2023-10-20 ENCOUNTER — Ambulatory Visit: Payer: Self-pay | Admitting: Family

## 2023-10-20 MED ORDER — METHOCARBAMOL 500 MG PO TABS
500.0000 mg | ORAL_TABLET | Freq: Two times a day (BID) | ORAL | 0 refills | Status: DC
  Filled 2023-10-20 – 2023-12-21 (×2): qty 20, 10d supply, fill #0

## 2023-10-20 MED ORDER — FUROSEMIDE 20 MG PO TABS
10.0000 mg | ORAL_TABLET | Freq: Every day | ORAL | 1 refills | Status: DC
  Filled 2023-10-20 – 2023-12-21 (×3): qty 15, 30d supply, fill #0

## 2023-10-20 MED ORDER — EMPAGLIFLOZIN 10 MG PO TABS: 10.0000 mg | ORAL_TABLET | Freq: Every day | ORAL | 5 refills | Status: DC
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
## 2023-10-21 ENCOUNTER — Other Ambulatory Visit (HOSPITAL_COMMUNITY): Payer: Self-pay

## 2023-10-22 ENCOUNTER — Other Ambulatory Visit (HOSPITAL_COMMUNITY): Payer: Self-pay

## 2023-10-23 ENCOUNTER — Other Ambulatory Visit (HOSPITAL_COMMUNITY): Payer: Self-pay

## 2023-10-26 ENCOUNTER — Other Ambulatory Visit (HOSPITAL_COMMUNITY): Payer: Self-pay

## 2023-10-27 ENCOUNTER — Other Ambulatory Visit: Payer: Self-pay | Admitting: *Deleted

## 2023-10-27 ENCOUNTER — Other Ambulatory Visit (HOSPITAL_COMMUNITY): Payer: Self-pay

## 2023-10-27 ENCOUNTER — Encounter: Payer: Self-pay | Admitting: *Deleted

## 2023-10-27 DIAGNOSIS — I5042 Chronic combined systolic (congestive) and diastolic (congestive) heart failure: Secondary | ICD-10-CM

## 2023-10-27 DIAGNOSIS — I428 Other cardiomyopathies: Secondary | ICD-10-CM

## 2023-10-28 ENCOUNTER — Other Ambulatory Visit (HOSPITAL_COMMUNITY): Payer: Self-pay

## 2023-10-28 ENCOUNTER — Telehealth: Payer: Self-pay | Admitting: Family

## 2023-10-28 NOTE — Telephone Encounter (Signed)
 Called to confirm/remind patient of their appointment at the [facility] on 10/29/23.   Appointment:   [x] Confirmed  [] Left mess   [] No answer/No voice mail  [] VM Full/unable to leave message  [] Phone not in service  Patient reminded to bring all medications and/or complete list.  Confirmed patient has transportation. Gave directions, instructed to utilize valet parking.

## 2023-10-29 ENCOUNTER — Ambulatory Visit: Payer: Self-pay | Attending: Cardiology | Admitting: Cardiology

## 2023-10-29 ENCOUNTER — Other Ambulatory Visit (HOSPITAL_COMMUNITY): Payer: Self-pay

## 2023-10-29 ENCOUNTER — Other Ambulatory Visit: Payer: Self-pay

## 2023-10-29 VITALS — BP 117/70 | HR 89 | Wt 183.0 lb

## 2023-10-29 DIAGNOSIS — I110 Hypertensive heart disease with heart failure: Secondary | ICD-10-CM | POA: Insufficient documentation

## 2023-10-29 DIAGNOSIS — I7121 Aneurysm of the ascending aorta, without rupture: Secondary | ICD-10-CM | POA: Insufficient documentation

## 2023-10-29 DIAGNOSIS — R0602 Shortness of breath: Secondary | ICD-10-CM | POA: Diagnosis present

## 2023-10-29 DIAGNOSIS — F1090 Alcohol use, unspecified, uncomplicated: Secondary | ICD-10-CM | POA: Insufficient documentation

## 2023-10-29 DIAGNOSIS — I351 Nonrheumatic aortic (valve) insufficiency: Secondary | ICD-10-CM | POA: Diagnosis present

## 2023-10-29 DIAGNOSIS — Q231 Congenital insufficiency of aortic valve: Secondary | ICD-10-CM

## 2023-10-29 DIAGNOSIS — Q2381 Bicuspid aortic valve: Secondary | ICD-10-CM

## 2023-10-29 DIAGNOSIS — I5032 Chronic diastolic (congestive) heart failure: Secondary | ICD-10-CM

## 2023-10-29 DIAGNOSIS — I77810 Thoracic aortic ectasia: Secondary | ICD-10-CM | POA: Diagnosis not present

## 2023-10-29 DIAGNOSIS — I5022 Chronic systolic (congestive) heart failure: Secondary | ICD-10-CM | POA: Insufficient documentation
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
## 2023-10-29 NOTE — Patient Instructions (Signed)
 Medication Changes:  Stop Jardiance    Follow-Up in: 3 months with [name], FNP.  At the [facility], you and your health needs are our priority. We have a designated team specialized in the treatment of Heart Failure. This Care Team includes your primary Heart Failure Specialized Cardiologist (physician), Advanced Practice Providers (APPs- Physician Assistants and Nurse Practitioners), and Pharmacist who all work together to provide you with the care you need, when you need it.   You may see any of the following providers on your designated Care Team at your next follow up:  Dr. Toribio Fuel Dr. Ezra Shuck Dr. Ria Commander Dr. Odis Brownie [name], FNP Jaun Bash, RPH-CPP  Please be sure to bring in all your medications bottles to every appointment.   Need to Contact Us :  If you have any questions or concerns before your next appointment please send us  a message through [place] or call our office at [phone].    TO LEAVE A MESSAGE FOR THE NURSE SELECT OPTION 2, PLEASE LEAVE A MESSAGE INCLUDING: YOUR NAME DATE OF BIRTH CALL BACK NUMBER REASON FOR CALL**this is important as we prioritize the call backs  YOU WILL RECEIVE A CALL BACK THE SAME DAY AS LONG AS YOU CALL BEFORE 4:00 PM

## 2023-10-30 NOTE — Addendum Note (Signed)
 Addended by: Anneka Studer on: 10/30/2023 01:24 PM   Modules accepted: Orders

## 2023-11-03 ENCOUNTER — Other Ambulatory Visit (HOSPITAL_COMMUNITY): Payer: Self-pay

## 2023-11-03 MED ORDER — AZITHROMYCIN 250 MG PO TABS
ORAL_TABLET | ORAL | 0 refills | Status: AC
  Filled 2023-11-03: qty 6, 5d supply, fill #0

## 2023-11-04 ENCOUNTER — Other Ambulatory Visit (HOSPITAL_COMMUNITY): Payer: Self-pay

## 2023-11-06 ENCOUNTER — Telehealth: Payer: Self-pay | Admitting: *Deleted

## 2023-11-09 DIAGNOSIS — H18891 Other specified disorders of cornea, right eye: Secondary | ICD-10-CM | POA: Diagnosis not present

## 2023-11-09 DIAGNOSIS — E119 Type 2 diabetes mellitus without complications: Secondary | ICD-10-CM | POA: Diagnosis not present

## 2023-11-09 LAB — HM DIABETES EYE EXAM

## 2023-11-09 NOTE — Telephone Encounter (Signed)
 Opened in error

## 2023-11-10 ENCOUNTER — Other Ambulatory Visit: Payer: Self-pay | Admitting: *Deleted

## 2023-11-10 NOTE — Patient Outreach (Signed)
 Phone call to patient to complete social work needs assessment. Patient answered, however stated that this was not a good time to talk-family member had been hospitalized.  Appointment asked to be re-scheduled.  Talibah Colasurdo, LCSW [place]  [facility], [facility] Licensed Clinical Social Worker  Direct Dial: [phone]

## 2023-11-11 ENCOUNTER — Telehealth: Payer: Self-pay | Admitting: *Deleted

## 2023-11-11 ENCOUNTER — Other Ambulatory Visit (HOSPITAL_COMMUNITY): Payer: Self-pay

## 2023-11-11 ENCOUNTER — Encounter: Payer: Self-pay | Admitting: *Deleted
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
## 2023-11-12 ENCOUNTER — Telehealth: Payer: Self-pay | Admitting: *Deleted

## 2023-11-12 DIAGNOSIS — I5032 Chronic diastolic (congestive) heart failure: Secondary | ICD-10-CM

## 2023-11-12 NOTE — Progress Notes (Signed)
 Complex Care Management Care Guide Note  11/12/2023 Name: Darius [name] MRN: [id number] DOB: Oct 11, 1970  Darius [name] is a 53 y.o. year old male who is a primary care patient of Pardue, Lauraine SAILOR, DO and is actively engaged with the care management team. I reached out to [employer] by phone today to assist with re-scheduling  with the Licensed Clinical Child psychotherapist.  Follow up plan: Telephone appointment with complex care management team member scheduled for:  7/22  with RNCM 7/21 with  BSW and 8/11 with LCSW   Harlene Satterfield  [facility], [facility] Guide  Direct Dial: [phone]  Fax [phone]

## 2023-11-12 NOTE — Telephone Encounter (Signed)
 Refused nurse triage    Copied from CRM [id number]. Topic: Clinical - Red Word Triage >> Nov 12, 2023  2:39 PM Antwanette L wrote: Red Word that prompted transfer to Nurse Triage: Patient was having trouble breathing on the phone. The patient denied to speak with nurse triage.

## 2023-11-13 ENCOUNTER — Other Ambulatory Visit: Payer: Self-pay | Admitting: *Deleted

## 2023-11-13 ENCOUNTER — Telehealth: Payer: Self-pay

## 2023-11-13 ENCOUNTER — Encounter: Payer: Self-pay | Admitting: *Deleted

## 2023-11-13 NOTE — Progress Notes (Signed)
 Complex Care Management Note Care Guide Note  11/13/2023 Name: Darius [name] MRN: [id number] DOB: Dec 05, 1970   Complex Care Management Outreach Attempts: An unsuccessful telephone outreach was attempted today to offer the patient information about available complex care management services.  Follow Up Plan:  Additional outreach attempts will be made to offer the patient complex care management information and services.   Encounter Outcome:  No Answer  Daymon Hora [facility]  [facility] Guide Direct Dial: [phone]  Fax: [phone] Website: [email or web address]

## 2023-11-13 NOTE — Telephone Encounter (Signed)
FYI please see the message below.

## 2023-11-16 ENCOUNTER — Telehealth

## 2023-11-17 ENCOUNTER — Telehealth: Payer: Self-pay

## 2023-11-17 ENCOUNTER — Telehealth: Payer: Self-pay | Admitting: *Deleted

## 2023-11-17 NOTE — Progress Notes (Signed)
 Complex Care Management Note Care Guide Note  11/17/2023 Name: Darius [name] MRN: [id number] DOB: 05/27/1970  [name] is a 53 y.o. year old male who is a primary care patient of Donzella Lauraine SAILOR, DO . The community resource team was consulted for assistance with Medicaid Dentist list.  SDOH screenings and interventions completed:  Yes  Social Drivers of Health From This Encounter   Food Insecurity: Patient Declined (11/17/2023)   Hunger Vital Sign    Worried About Running Out of Food in the Last Year: Patient declined    Ran Out of Food in the Last Year: Patient declined  Housing: Unknown (11/17/2023)   Housing Stability Vital Sign    Unable to Pay for Housing in the Last Year: Patient declined    Number of Times Moved in the Last Year: 0    Homeless in the Last Year: Patient declined  Financial Resource Strain: Patient Declined (11/17/2023)   Overall Financial Resource Strain (CARDIA)    Difficulty of Paying Living Expenses: Patient declined  Transportation Needs: Patient Declined (11/17/2023)   PRAPARE - Administrator, Civil Service (Medical): Patient declined    Lack of Transportation (Non-Medical): Patient declined  Utilities: Patient Declined (11/17/2023)   Utilities    Threatened with loss of utilities: Patient declined    SDOH Interventions Today    Flowsheet Row Most Recent Value  SDOH Interventions   Food Insecurity Interventions Patient Declined  Housing Interventions Patient Declined  Transportation Interventions Patient Declined  Utilities Interventions Patient Declined  Financial Strain Interventions Patient Declined     Care guide performed the following interventions: Patient provided with information about care guide support team and interviewed to confirm resource needs Verified email address [email or web address] sent [place] Medicaid Dentist list , [employer] transportation information.  Follow Up Plan:  No further follow up planned at this  time. The patient has been provided with needed resources.  Encounter Outcome:  Patient Visit Completed  Javontae Marlette [facility]  [employer] Guide Direct Dial: [phone]  Fax: [phone] Website: [email or web address]

## 2023-11-19 ENCOUNTER — Other Ambulatory Visit: Payer: Self-pay

## 2023-11-19 NOTE — Patient Instructions (Signed)
 Visit Information  [name] was given information about Medicaid Managed Care team care coordination services as a part of their [facility] Community Plan Medicaid benefit. Darius [name] verbally consented to engagement with the [facility] Managed Care team.   If you are experiencing a medical emergency, please call 911 or report to your local emergency department or urgent care.   If you have a non-emergency medical problem during routine business hours, please contact your provider's office and ask to speak with a nurse.   For questions related to your [facility], please call: [phone] or visit the homepage here: [email or web address]  If you would like to schedule transportation through your [facility], please call the following number at least 2 days in advance of your appointment: [phone]   Rides for urgent appointments can also be made after hours by calling Member Services.  Call the Behavioral Health Crisis Line at ([phone], at any time, 24 hours a day, 7 days a week. If you are in danger or need immediate medical attention call 911.  If you would like help to quit smoking, call 1-800-QUIT-NOW (408-396-4386) OR Espaol: 1-855-Djelo-Ya (8-144-664-6430) o para ms informacin haga clic aqu or Text READY to 799-599 to register via text  Darius [name] - following are the goals we discussed in your visit today:   Goals Addressed             This Visit's Progress    VBCI Social Work Care Plan       Problems:   Dental resources  CSW Clinical Goal(s):   Over the next 30 days the Patient will work with Child psychotherapist to address concerns related to obtaining a dentist.  Interventions:  SW will mail Medicaid dental resources to address on file.  Patient Goals/Self-Care Activities:  Patient will contact a dentist to schedule an appointment.  Plan:   The  care management team will reach out to the patient again over the next 15 days.        Social Worker will follow up on 12/03/23 at 11am.   Darius [name], BSW, MHA [place]  [facility] Social Worker, Population Health [phone]   Following is a copy of your plan of care:  There are no care plans that you recently modified to display for this patient.

## 2023-11-19 NOTE — Patient Outreach (Signed)
 Complex Care Management   Visit Note  11/19/2023  Name:  Darius [name] MRN: [id number] DOB: Jan 21, 1971  Situation: Referral received for Complex Care Management related to dentist I obtained verbal consent from Patient.  Visit completed with patient  on the phone  Background:   Past Medical History:  Diagnosis Date   Allergy    Anxiety    Aortic insufficiency due to bicuspid aortic valve 10/10/2021   Cardiac MRI 05/11/2021: Bicuspid aortic valve with fusion of the R & [place] cusps with severe AI (regurgitant fraction of 59%.   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  Severe AI 2/2 Bicusid AoV (R&[place] cusp fusion). Ascending Ao 42 mm   Valvular heart disease    a.) TTE on 12/19/2017 --> mild MV regurgitation; moderate AV regurgitation    Assessment: SW completed a telephone outreach with patient, he states he is wanting to go to the dentist before surgery. He has started the process with a dentist office in [place] but does not want to travel that far being in pain. SW and patient agreed for dental resources for [employer] to be mailed to address on file. Patient does not have any sdoh needs as of now.  SDOH Interventions    Flowsheet Row Telephone from 11/17/2023 in [place] POPULATION HEALTH DEPARTMENT Patient Outreach Telephone from 10/27/2023 in [place] POPULATION HEALTH DEPARTMENT Patient Outreach Telephone from 09/04/2023  in [place] POPULATION HEALTH DEPARTMENT Care Coordination from 08/07/2023 in [place] POPULATION HEALTH DEPARTMENT Care Coordination from 07/22/2023 in [facility] Care Coordination Care Coordination from 07/10/2023 in [facility] Care Coordination  SDOH Interventions        Food Insecurity Interventions Patient Declined Intervention Not Indicated  [Not currently a problem. Was provided resources.] Intervention Not Indicated Intervention Not Indicated Other (Comment)  [patient states his situation has recently changed and now he is living closer to family. He states he lives with his wife now] --  Housing Interventions Patient Declined Intervention Not Indicated  [No current needs. Staying with wife for now.] Intervention Not Indicated  [no present concerns with housing] Intervention Not Indicated Intervention Not Indicated --  Transportation Interventions Patient Declined -- -- Intervention Not Indicated Intervention Not Indicated --  Utilities Interventions Patient Declined -- Intervention Not Indicated Intervention Not Indicated Intervention Not Indicated Intervention Not Indicated  Financial Strain Interventions Patient Declined Intervention Not Indicated -- -- -- --  Physical Activity Interventions -- Patient Declined -- -- -- --  Stress Interventions -- Other (Comment)  [referral to [facility] Care Management LCSW] -- -- -- --  Health Literacy Interventions -- Intervention Not Indicated -- -- -- --     Recommendation:   No recommendations at this time.  Follow Up Plan:   Telephone follow-up 12/03/23 at 11am  Thersia Hoar, BSW, [facility] [place]  [facility] Social Worker,  Population Health [phone]

## 2023-11-24 ENCOUNTER — Other Ambulatory Visit: Payer: Self-pay | Admitting: Cardiology

## 2023-11-24 ENCOUNTER — Other Ambulatory Visit (HOSPITAL_COMMUNITY): Payer: Self-pay

## 2023-11-24 ENCOUNTER — Encounter (HOSPITAL_COMMUNITY): Payer: Self-pay

## 2023-11-24 DIAGNOSIS — R0602 Shortness of breath: Secondary | ICD-10-CM

## 2023-11-24 DIAGNOSIS — H5213 Myopia, bilateral: Secondary | ICD-10-CM | POA: Diagnosis not present

## 2023-12-03 ENCOUNTER — Other Ambulatory Visit: Payer: Self-pay

## 2023-12-03 ENCOUNTER — Other Ambulatory Visit (HOSPITAL_COMMUNITY): Payer: Self-pay

## 2023-12-03 NOTE — Patient Outreach (Signed)
 Complex Care Management   Visit Note  12/03/2023  Name:  Darius [name] MRN: [id number] DOB: 02/07/1971  Situation: Referral received for Complex Care Management related to dental resources I obtained verbal consent from Patient.  Visit completed with patient  on the phone  Background:   Past Medical History:  Diagnosis Date   Allergy    Anxiety    Aortic insufficiency due to bicuspid aortic valve 10/10/2021   Cardiac MRI 05/11/2021: Bicuspid aortic valve with fusion of the R & [place] cusps with severe AI (regurgitant fraction of 59%.   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  Severe AI 2/2 Bicusid AoV (R&[place] cusp fusion). Ascending Ao 42 mm   Valvular heart disease    a.) TTE on 12/19/2017 --> mild MV regurgitation; moderate AV regurgitation    Assessment: SW completed a telephone outreach with patient, he states he went to dental works and has to pay $1200 out of pocket for his procedure in order to get his surgery. SW and patient discussed SW locating additional resources that may be able to help with the out of pocket cost. SW located two resources that may be able to assist patient. SW mailed resources to address on file.  SDOH Interventions    Flowsheet Row Telephone from 11/17/2023 in [place] POPULATION HEALTH DEPARTMENT Patient Outreach Telephone from 10/27/2023 in [place] POPULATION HEALTH DEPARTMENT Patient Outreach  Telephone from 09/04/2023 in [place] POPULATION HEALTH DEPARTMENT Care Coordination from 08/07/2023 in [place] POPULATION HEALTH DEPARTMENT Care Coordination from 07/22/2023 in [facility] Care Coordination Care Coordination from 07/10/2023 in [facility] Care Coordination  SDOH Interventions        Food Insecurity Interventions Patient Declined Intervention Not Indicated  [Not currently a problem. Was provided resources.] Intervention Not Indicated Intervention Not Indicated Other (Comment)  [patient states his situation has recently changed and now he is living closer to family. He states he lives with his wife now] --  Housing Interventions Patient Declined Intervention Not Indicated  [No current needs. Staying with wife for now.] Intervention Not Indicated  [no present concerns with housing] Intervention Not Indicated Intervention Not Indicated --  Transportation Interventions Patient Declined -- -- Intervention Not Indicated Intervention Not Indicated --  Utilities Interventions Patient Declined -- Intervention Not Indicated Intervention Not Indicated Intervention Not Indicated Intervention Not Indicated  Financial Strain Interventions Patient Declined Intervention Not Indicated -- -- -- --  Physical Activity Interventions -- Patient Declined -- -- -- --  Stress Interventions -- Other (Comment)  [referral to [facility] Care Management LCSW] -- -- -- --  Health Literacy Interventions -- Intervention Not Indicated -- -- -- --      Recommendation:   No recommendations at this time.  Follow Up Plan:   Telephone follow-up 12/17/23 at 11am  Thersia Hoar, BSW, [place]  Value Based Care  Institute Child psychotherapist, Population Health [phone]

## 2023-12-03 NOTE — Patient Instructions (Signed)
 Visit Information  [name] was given information about Medicaid Managed Care team care coordination services as a part of their [facility] Community Plan Medicaid benefit.   If you would like to schedule transportation through your [facility], please call the following number at least 2 days in advance of your appointment: [phone]   Rides for urgent appointments can also be made after hours by calling Member Services.  Call the Behavioral Health Crisis Line at [phone], at any time, 24 hours a day, 7 days a week. If you are in danger or need immediate medical attention call 911.   Darius [name] - following are the goals we discussed in your visit today:   Goals Addressed             This Visit's Progress    VBCI Social Work Care Plan       Problems:   Dental resources  CSW Clinical Goal(s):   Over the next 30 days the Patient will work with Child psychotherapist to address concerns related to obtaining a dentist.  Interventions:  SW will mail Medicaid dental resources to address on file. SW researched addiitional resources to assist with dental procedures.  Patient Goals/Self-Care Activities:  Patient will contact a dentist to schedule an appointment.  Plan:   The care management team will reach out to the patient again over the next 15 days.         Social Worker will follow up on 12/17/23 at 11am.   Thersia Hoar, BSW, MHA [place]  [facility] Institute Social Worker, Population Health [phone]   Following is a copy of your plan of care:  There are no care plans that you recently modified to display for this patient.

## 2023-12-07 ENCOUNTER — Other Ambulatory Visit: Payer: Self-pay | Admitting: *Deleted
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
## 2023-12-07 NOTE — Patient Instructions (Signed)
 Visit Information  [name] was given information about Medicaid Managed Care team care coordination services as a part of their [facility] Community Plan Medicaid benefit.   If you would like to schedule transportation through your [facility], please call the following number at least 2 days in advance of your appointment: [phone]   Rides for urgent appointments can also be made after hours by calling Member Services.  Call the Behavioral Health Crisis Line at [phone], at any time, 24 hours a day, 7 days a week. If you are in danger or need immediate medical attention call 911.   [name] - following are the goals we discussed in your visit today:   Goals Addressed             This Visit's Progress    VBCI Social Work Care Plan       Problems:   Disease Management support and education needs related to Depression: depressed mood  CSW Clinical Goal(s):   Over the next 90 days the Patient will will follow up with a therapist for ongoing mental health follow up as directed by Social Work.  Interventions:  Mental Health:  Evaluation of current treatment plan related to Depression: depressed mood Active listening / Reflection utilized Behavioral Activation reviewed Discussed referral options to connect for ongoing therapy: patient agreeable to ongoing mental health support in the [facility] area Emotional Support Provided PHQ2/PHQ9 completed Suicidal Ideation/Homicidal Ideation assessed: patient denied thoughts of harm to self or others  Patient Goals/Self-Care Activities:  Continue taking your medication as prescribed.   Coordinate with [facility] to assist with a payment plan for dental work . Follow up with mental health provider for ongoing mental health support once identified  Plan:   Telephone follow up appointment with care management team member scheduled for:  12/22/23        Patient verbalizes understanding of  instructions and care plan provided today and agrees to view in MyChart. Active MyChart status and patient understanding of how to access instructions and care plan via MyChart confirmed with patient.     Licensed Clinical Social Worker will follow up with patient to connect him for ongoing mental health follow up  [employer], LCSW [place]  [facility], Population Health Licensed Clinical Social Worker  Direct Dial: [phone]     Following is a copy of your plan of care:  There are no care plans that you recently modified to display for this patient.

## 2023-12-17 ENCOUNTER — Other Ambulatory Visit: Payer: Self-pay

## 2023-12-17 NOTE — Patient Instructions (Signed)
 Visit Information  Mr. Gustafson was given information about Medicaid Managed Care team care coordination services as a part of their [facility] Community Plan Medicaid benefit.   If you would like to schedule transportation through your [facility], please call the following number at least 2 days in advance of your appointment: [phone]   Rides for urgent appointments can also be made after hours by calling Member Services.  Call the Behavioral Health Crisis Line at [phone], at any time, 24 hours a day, 7 days a week. If you are in danger or need immediate medical attention call 911.   Mr. Martinson - following are the goals we discussed in your visit today:   Goals Addressed             This Visit's Progress    VBCI Social Work Care Plan       Problems:   Dental resources  CSW Clinical Goal(s):   Over the next 30 days the Patient will work with Child psychotherapist to address concerns related to obtaining a dentist.  Interventions:  SW will mail Medicaid dental resources to address on file. SW researched addiitional resources to assist with dental procedures. SW will resend resources patient did not receive.  Patient Goals/Self-Care Activities:  Patient will contact a dentist to schedule an appointment.  Plan:   The care management team will reach out to the patient again over the next 15 days.        Social Worker will follow up with patient in 7 days.   Thersia Hoar, HEDWIG, MHA   Value Based Care Institute Social Worker, Population Health [phone]   Following is a copy of your plan of care:  There are no care plans that you recently modified to display for this patient.

## 2023-12-17 NOTE — Patient Outreach (Signed)
 Complex Care Management   Visit Note  12/17/2023  Name:  Darius [name] MRN: [id number] DOB: 07/20/1970  Situation: Referral received for Complex Care Management related to dental, DME, therapy I obtained verbal consent from Patient.  Visit completed with Patient  on the phone  Background:   Past Medical History:  Diagnosis Date   Allergy    Anxiety    Aortic insufficiency due to bicuspid aortic valve 10/10/2021   Cardiac MRI 05/11/2021: Bicuspid aortic valve with fusion of the R & [place] cusps with severe AI (regurgitant fraction of 59%.   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  Severe AI 2/2 Bicusid AoV (R&[place] cusp fusion). Ascending Ao 42 mm   Valvular heart disease    a.) TTE on 12/19/2017 --> mild MV regurgitation; moderate AV regurgitation    Assessment: SW completed a telephone outreach with patient, he states he did not receieve the dental resources SW sent. SW will resend resources. Patient did express frustration about the care he is receiving. Patient states he receieved a call from Darius [name] [phone] , patient has contacted Darius back but he is not receiving any calls back. Patient is very concerned of this call and worried because he is receiving a call from the cancer center . SW contacted the number provided [phone] and was given another number [phone] to assist patient in getting  clariy. SW left a vm. Patient is also requesting PT/OT and DME Cane. SW will send PCP a message.  SDOH Interventions    Flowsheet Row Telephone from 11/17/2023 in [place] POPULATION HEALTH DEPARTMENT Patient Outreach Telephone from 10/27/2023 in [place] POPULATION HEALTH DEPARTMENT Patient Outreach Telephone from 09/04/2023 in [place] POPULATION HEALTH DEPARTMENT Care Coordination from 08/07/2023 in [place] POPULATION HEALTH DEPARTMENT Care Coordination from 07/22/2023 in [facility] Care Coordination Care Coordination from 07/10/2023 in [facility] Care Coordination  SDOH Interventions        Food Insecurity Interventions Patient Declined Intervention Not Indicated  [Not currently a problem. Was provided resources.] Intervention Not Indicated Intervention Not Indicated Other (Comment)  [patient states his situation has recently changed and now he is living closer to family. He states he lives with his wife now] --  Housing Interventions Patient Declined Intervention Not Indicated  [No current needs. Staying with wife for now.] Intervention Not Indicated  [no present concerns with housing] Intervention Not Indicated Intervention Not Indicated --  Transportation Interventions Patient Declined -- -- Intervention Not Indicated Intervention Not Indicated --  Utilities Interventions Patient Declined -- Intervention Not Indicated Intervention Not Indicated Intervention Not Indicated Intervention Not Indicated  Financial Strain Interventions Patient Declined Intervention Not Indicated -- -- -- --  Physical Activity Interventions -- Patient Declined -- -- -- --  Stress Interventions -- Other (Comment)  [referral to [facility] Health Care Management LCSW] -- -- -- --  Health Literacy Interventions -- Intervention Not Indicated -- -- -- --      Recommendation:   No recommendations at this time  Follow Up Plan:   Telephone follow-up 12/24/23 at  1130  Thersia Hoar, BSW, [place]  Value Based Care Institute Social Worker, Population Health [phone]

## 2023-12-18 ENCOUNTER — Telehealth: Payer: Self-pay

## 2023-12-18 ENCOUNTER — Ambulatory Visit: Payer: Self-pay

## 2023-12-18 NOTE — Telephone Encounter (Signed)
 Copied from CRM #[id number]. Topic: General - Transportation >> Dec 18, 2023  1:07 PM Sophia H wrote: Reason for CRM: Patient is needing to arrange transportation for his upcoming appt on 08/27. States he normally sets up through clinic? Please reach out & advise # [phone]
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
## 2023-12-21 ENCOUNTER — Other Ambulatory Visit: Payer: Self-pay | Admitting: Physician Assistant

## 2023-12-21 ENCOUNTER — Other Ambulatory Visit (HOSPITAL_COMMUNITY): Payer: Self-pay

## 2023-12-21 ENCOUNTER — Other Ambulatory Visit: Payer: Self-pay | Admitting: Family Medicine

## 2023-12-21 DIAGNOSIS — R0602 Shortness of breath: Secondary | ICD-10-CM

## 2023-12-22 ENCOUNTER — Other Ambulatory Visit (HOSPITAL_COMMUNITY): Payer: Self-pay

## 2023-12-22 ENCOUNTER — Encounter: Payer: Self-pay | Admitting: *Deleted

## 2023-12-22 ENCOUNTER — Telehealth: Payer: Self-pay | Admitting: *Deleted

## 2023-12-22 MED ORDER — ALBUTEROL SULFATE HFA 108 (90 BASE) MCG/ACT IN AERS
2.0000 | INHALATION_SPRAY | Freq: Four times a day (QID) | RESPIRATORY_TRACT | 3 refills | Status: DC | PRN
  Filled 2023-12-22: qty 18, 16d supply, fill #0

## 2023-12-22 NOTE — Patient Instructions (Signed)
 Valor Moure - I am sorry I was unable to reach you today for our scheduled appointment. I work with Donzella Lauraine SAILOR, DO and am calling to support your healthcare needs. Please contact me at [phone] at your earliest convenience. I look forward to speaking with you soon.    Thank you,    Lorain Keast, LCSW [place]  [facility] Licensed Clinical Social Worker  Direct Dial: [phone]

## 2023-12-23 ENCOUNTER — Other Ambulatory Visit: Payer: Self-pay

## 2023-12-23 ENCOUNTER — Ambulatory Visit (INDEPENDENT_AMBULATORY_CARE_PROVIDER_SITE_OTHER): Admitting: Family Medicine

## 2023-12-23 ENCOUNTER — Encounter: Payer: Self-pay | Admitting: Family Medicine

## 2023-12-23 VITALS — BP 123/69 | HR 76 | Ht 74.4 in | Wt 183.0 lb

## 2023-12-23 DIAGNOSIS — I5032 Chronic diastolic (congestive) heart failure: Secondary | ICD-10-CM | POA: Diagnosis not present

## 2023-12-23 DIAGNOSIS — F209 Schizophrenia, unspecified: Secondary | ICD-10-CM

## 2023-12-23 DIAGNOSIS — F4310 Post-traumatic stress disorder, unspecified: Secondary | ICD-10-CM

## 2023-12-23 DIAGNOSIS — I700 Atherosclerosis of aorta: Secondary | ICD-10-CM

## 2023-12-23 DIAGNOSIS — F419 Anxiety disorder, unspecified: Secondary | ICD-10-CM

## 2023-12-23 DIAGNOSIS — F332 Major depressive disorder, recurrent severe without psychotic features: Secondary | ICD-10-CM

## 2023-12-23 DIAGNOSIS — G8929 Other chronic pain: Secondary | ICD-10-CM

## 2023-12-23 DIAGNOSIS — Z716 Tobacco abuse counseling: Secondary | ICD-10-CM

## 2023-12-23 DIAGNOSIS — N182 Chronic kidney disease, stage 2 (mild): Secondary | ICD-10-CM

## 2023-12-23 DIAGNOSIS — R29898 Other symptoms and signs involving the musculoskeletal system: Secondary | ICD-10-CM

## 2023-12-23 DIAGNOSIS — F1290 Cannabis use, unspecified, uncomplicated: Secondary | ICD-10-CM

## 2023-12-23 DIAGNOSIS — Z87828 Personal history of other (healed) physical injury and trauma: Secondary | ICD-10-CM

## 2023-12-23 DIAGNOSIS — M792 Neuralgia and neuritis, unspecified: Secondary | ICD-10-CM

## 2023-12-23 DIAGNOSIS — J439 Emphysema, unspecified: Secondary | ICD-10-CM

## 2023-12-23 DIAGNOSIS — F1411 Cocaine abuse, in remission: Secondary | ICD-10-CM

## 2023-12-23 DIAGNOSIS — K047 Periapical abscess without sinus: Secondary | ICD-10-CM | POA: Diagnosis not present

## 2023-12-23 DIAGNOSIS — R202 Paresthesia of skin: Secondary | ICD-10-CM | POA: Diagnosis not present

## 2023-12-23 DIAGNOSIS — F1910 Other psychoactive substance abuse, uncomplicated: Secondary | ICD-10-CM

## 2023-12-23 DIAGNOSIS — Z1211 Encounter for screening for malignant neoplasm of colon: Secondary | ICD-10-CM

## 2023-12-23 DIAGNOSIS — F17200 Nicotine dependence, unspecified, uncomplicated: Secondary | ICD-10-CM

## 2023-12-23 DIAGNOSIS — R2681 Unsteadiness on feet: Secondary | ICD-10-CM

## 2023-12-23 MED ORDER — DULERA 100-5 MCG/ACT IN AERO
2.0000 | INHALATION_SPRAY | Freq: Two times a day (BID) | RESPIRATORY_TRACT | 5 refills | Status: AC
  Filled 2023-12-23 – 2024-01-01 (×3): qty 13, 30d supply, fill #0
  Filled 2024-02-04 – 2024-02-08 (×4): qty 13, 30d supply, fill #1

## 2023-12-23 MED ORDER — ADJUSTABLE ALUMINUM CANE MISC: 1.0000 | Freq: Once | 1 refills | Status: AC

## 2023-12-23 MED ORDER — SACUBITRIL-VALSARTAN 24-26 MG PO TABS
ORAL_TABLET | ORAL | 0 refills | Status: DC
  Filled 2023-12-23: qty 30, fill #0

## 2023-12-23 MED ORDER — FUROSEMIDE 20 MG PO TABS
10.0000 mg | ORAL_TABLET | Freq: Every day | ORAL | 1 refills | Status: DC
  Filled 2023-12-23 – 2024-02-08 (×5): qty 15, 30d supply, fill #0
  Filled 2024-03-21: qty 15, 30d supply, fill #1

## 2023-12-23 MED ORDER — QUAD CANE MISC: 1.0000 | Freq: Once | 0 refills | Status: AC

## 2023-12-23 MED ORDER — SACUBITRIL-VALSARTAN 24-26 MG PO TABS
ORAL_TABLET | ORAL | 1 refills | Status: DC
  Filled 2023-12-23 – 2024-01-01 (×3): qty 30, 30d supply, fill #0
  Filled 2024-02-04 – 2024-02-08 (×4): qty 30, 30d supply, fill #1
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
## 2023-12-24 ENCOUNTER — Telehealth: Payer: Self-pay

## 2023-12-24 ENCOUNTER — Other Ambulatory Visit: Payer: Self-pay

## 2023-12-24 LAB — COMPREHENSIVE METABOLIC PANEL WITH GFR
ALT: 17 IU/L (ref 0–44)
AST: 25 IU/L (ref 0–40)
Albumin: 4.6 g/dL (ref 3.8–4.9)
Alkaline Phosphatase: 87 IU/L (ref 44–121)
BUN/Creatinine Ratio: 11 (ref 9–20)
BUN: 16 mg/dL (ref 6–24)
Bilirubin Total: 0.7 mg/dL (ref 0.0–1.2)
CO2: 20 mmol/L (ref 20–29)
Calcium: 9.8 mg/dL (ref 8.7–10.2)
Chloride: 104 mmol/L (ref 96–106)
Creatinine, Ser: 1.49 mg/dL — ABNORMAL HIGH (ref 0.76–1.27)
Globulin, Total: 2.4 g/dL (ref 1.5–4.5)
Glucose: 82 mg/dL (ref 70–99)
Potassium: 4.2 mmol/L (ref 3.5–5.2)
Sodium: 141 mmol/L (ref 134–144)
Total Protein: 7 g/dL (ref 6.0–8.5)
eGFR: 55 mL/min/1.73 — ABNORMAL LOW (ref >59)

## 2023-12-24 LAB — MICROALBUMIN / CREATININE URINE RATIO
Creatinine, Urine: 204.1 mg/dL
Microalb/Creat Ratio: 4 mg/g{creat} (ref 0–29)
Microalbumin, Urine: 7.6 ug/mL

## 2023-12-24 LAB — LIPID PANEL
Chol/HDL Ratio: 3.1 ratio (ref 0.0–5.0)
Cholesterol, Total: 198 mg/dL (ref 100–199)
HDL: 64 mg/dL (ref >39)
LDL Chol Calc (NIH): 112 mg/dL — ABNORMAL HIGH (ref 0–99)
Triglycerides: 127 mg/dL (ref 0–149)
VLDL Cholesterol Cal: 22 mg/dL (ref 5–40)

## 2023-12-24 LAB — VITAMIN D 25 HYDROXY (VIT D DEFICIENCY, FRACTURES): Vit D, 25-Hydroxy: 26.8 ng/mL — ABNORMAL LOW (ref 30.0–100.0)

## 2023-12-24 LAB — VITAMIN B12: Vitamin B-12: 422 pg/mL (ref 232–1245)

## 2023-12-25 ENCOUNTER — Telehealth: Payer: Self-pay | Admitting: Cardiology

## 2023-12-25 NOTE — Telephone Encounter (Signed)
 Called to confirm/remind patient of their appointment at the [facility] on 12/29/23.   Appointment:   [x] Confirmed  [] Left mess   [] No answer/No voice mail  [] VM Full/unable to leave message  [] Phone not in service  Patient reminded to bring all medications and/or complete list.  Confirmed patient has transportation. Gave directions, instructed to utilize valet parking.

## 2023-12-29 ENCOUNTER — Encounter: Admitting: Cardiology

## 2023-12-29 ENCOUNTER — Telehealth: Payer: Self-pay

## 2023-12-29 ENCOUNTER — Ambulatory Visit: Payer: Self-pay | Admitting: Family Medicine

## 2023-12-29 ENCOUNTER — Other Ambulatory Visit: Payer: Self-pay

## 2023-12-29 DIAGNOSIS — M62830 Muscle spasm of back: Secondary | ICD-10-CM

## 2023-12-29 DIAGNOSIS — M792 Neuralgia and neuritis, unspecified: Secondary | ICD-10-CM

## 2023-12-29 DIAGNOSIS — R29898 Other symptoms and signs involving the musculoskeletal system: Secondary | ICD-10-CM

## 2023-12-29 DIAGNOSIS — R944 Abnormal results of kidney function studies: Secondary | ICD-10-CM

## 2023-12-29 DIAGNOSIS — G8929 Other chronic pain: Secondary | ICD-10-CM

## 2023-12-29 DIAGNOSIS — R2681 Unsteadiness on feet: Secondary | ICD-10-CM

## 2023-12-29 DIAGNOSIS — Z87828 Personal history of other (healed) physical injury and trauma: Secondary | ICD-10-CM

## 2023-12-29 DIAGNOSIS — E559 Vitamin D deficiency, unspecified: Secondary | ICD-10-CM

## 2023-12-29 MED ORDER — VITAMIN D (ERGOCALCIFEROL) 1.25 MG (50000 UNIT) PO CAPS
50000.0000 [IU] | ORAL_CAPSULE | ORAL | 1 refills | Status: AC
  Filled 2023-12-29 – 2024-01-01 (×3): qty 12, 84d supply, fill #0
  Filled 2024-03-21: qty 12, 84d supply, fill #1
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
## 2023-12-29 NOTE — Telephone Encounter (Signed)
 Copied from CRM [id number]. Topic: Referral - Question >> Dec 29, 2023 12:46 PM Winona R wrote: Pt would like to transfer his Pain Management referral to a location in [place] as that is where he now resides.

## 2023-12-30 ENCOUNTER — Telehealth: Payer: Self-pay | Admitting: *Deleted

## 2023-12-30 NOTE — Patient Instructions (Signed)
 Visit InformationVisit Information  [name] was given information about Medicaid Managed Care team care coordination services as a part of their [facility] Community Plan Medicaid benefit.   If you would like to schedule transportation through your [facility], please call the following number at least 2 days in advance of your appointment: [phone]   Rides for urgent appointments can also be made after hours by calling Member Services.  Call the Behavioral Health Crisis Line at [phone], at any time, 24 hours a day, 7 days a week. If you are in danger or need immediate medical attention call 911.   [name] - following are the goals we discussed in your visit today:   Goals Addressed             This Visit's Progress    VBCI Social Work Care Plan       Problems:   Disease Management support and education needs related to Depression: depressed mood  CSW Clinical Goal(s):   Over the next 90 days the Patient will will follow up with a therapist for ongoing mental health follow up as directed by Social Work.  Interventions:  Mental Health:  Evaluation of current treatment plan related to Depression: depressed mood Patient continues to reports challenges with depression related to financial strain(continues to need extensive dental care) and issues concerning the management of his medical condition Dental school options for dental care discussed-patient declined stating that he is working with [facility] in [place] [employer] Active listening / Reflection utilized Mining engineer reviewed Emotional Support Provided Discussed referral options to connect for ongoing therapy: patient agreeable to ongoing mental health support in the [facility] area-collaboration phone call to [facility]-appointment scheduled for 01/13/24 at 10am [address] [phone]   Patient Goals/Self-Care Activities:  Continue taking your medication as  prescribed.   Coordinate with [facility] to assist with a payment plan for dental work . Follow up with [facility] for ongoing mental health support once identified  Plan:   Telephone follow up appointment with care management team member scheduled for:  01/11/24         The patient verbalized understanding of instructions, educational materials, and care plan provided today and agreed to receive a mailed copy of patient instructions, educational materials, and care plan.     Following is a copy of your plan of care:  There are no care plans that you recently modified to display for this patient.    Thank you for taking time to visit with me today. Please don't hesitate to contact me if I can be of assistance to you before our next scheduled appointment.  Your next care management appointment is by telephone on 01/11/24 at 10:30am    Please call the care guide team at [phone] if you need to cancel, schedule, or reschedule an appointment.   Please call the Suicide and Crisis Lifeline: 988 call the [place]  National Suicide Prevention Lifeline: (307) 830-8805 or TTY: (867) 538-7832 TTY (774)883-6381) to talk to a trained counselor call 1-800-273-TALK (toll free, 24 hour hotline) go to [facility] [address], [place] [phone]) if you are experiencing a Mental Health or Behavioral Health Crisis or need someone to talk to.  Darius Fells, LCSW [place]  [facility], [facility] Licensed Clinical Social Worker  Direct Dial: [phone]
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
## 2023-12-31 ENCOUNTER — Other Ambulatory Visit (HOSPITAL_COMMUNITY): Payer: Self-pay

## 2023-12-31 ENCOUNTER — Other Ambulatory Visit: Payer: Self-pay

## 2023-12-31 DIAGNOSIS — Z114 Encounter for screening for human immunodeficiency virus [HIV]: Secondary | ICD-10-CM

## 2023-12-31 NOTE — Addendum Note (Signed)
 Addended by: DONZELLA DOMINO on: 12/31/2023 08:20 AM   Modules accepted: Orders

## 2024-01-01 ENCOUNTER — Other Ambulatory Visit: Payer: Self-pay

## 2024-01-01 ENCOUNTER — Other Ambulatory Visit (HOSPITAL_COMMUNITY): Payer: Self-pay

## 2024-01-01 ENCOUNTER — Telehealth: Payer: Self-pay | Admitting: Family Medicine

## 2024-01-01 NOTE — Telephone Encounter (Unsigned)
 Copied from CRM [id number]. Topic: Referral - Status >> Jan 01, 2024 12:14 PM Tiffini S wrote: Reason for CRM: Patient called stating that he needs a referral to a kidney doctor in [place], he is getting appointments in [place] and he have moved to [place]. He is requesting all appointments to be scheduled in [place]. Need to take Medicaid and need asap for kidney function  Please call the patient back at [phone]

## 2024-01-05 ENCOUNTER — Telehealth: Payer: Self-pay | Admitting: Family

## 2024-01-06 ENCOUNTER — Encounter: Payer: Self-pay | Admitting: Rehabilitative and Restorative Service Providers"

## 2024-01-06 ENCOUNTER — Ambulatory Visit: Attending: Family Medicine | Admitting: Rehabilitative and Restorative Service Providers"

## 2024-01-06 ENCOUNTER — Ambulatory Visit: Payer: Self-pay

## 2024-01-06 ENCOUNTER — Telehealth: Payer: Self-pay

## 2024-01-06 ENCOUNTER — Other Ambulatory Visit: Payer: Self-pay

## 2024-01-06 DIAGNOSIS — M62830 Muscle spasm of back: Secondary | ICD-10-CM | POA: Insufficient documentation

## 2024-01-06 DIAGNOSIS — Z87828 Personal history of other (healed) physical injury and trauma: Secondary | ICD-10-CM | POA: Insufficient documentation

## 2024-01-06 DIAGNOSIS — M545 Low back pain, unspecified: Secondary | ICD-10-CM | POA: Insufficient documentation

## 2024-01-06 DIAGNOSIS — G8929 Other chronic pain: Secondary | ICD-10-CM | POA: Insufficient documentation

## 2024-01-06 DIAGNOSIS — M6281 Muscle weakness (generalized): Secondary | ICD-10-CM | POA: Insufficient documentation

## 2024-01-06 DIAGNOSIS — R29898 Other symptoms and signs involving the musculoskeletal system: Secondary | ICD-10-CM | POA: Insufficient documentation

## 2024-01-06 DIAGNOSIS — R2681 Unsteadiness on feet: Secondary | ICD-10-CM | POA: Diagnosis not present

## 2024-01-06 DIAGNOSIS — R2689 Other abnormalities of gait and mobility: Secondary | ICD-10-CM | POA: Insufficient documentation

## 2024-01-06 DIAGNOSIS — R252 Cramp and spasm: Secondary | ICD-10-CM | POA: Insufficient documentation

## 2024-01-06 DIAGNOSIS — M5459 Other low back pain: Secondary | ICD-10-CM | POA: Insufficient documentation

## 2024-01-06 NOTE — Telephone Encounter (Signed)
 Pt is scheduled to see Dr. Mercer tomorrow.
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
## 2024-01-07 ENCOUNTER — Ambulatory Visit: Admitting: Family Medicine

## 2024-01-11 ENCOUNTER — Other Ambulatory Visit: Payer: Self-pay | Admitting: *Deleted

## 2024-01-12 NOTE — Telephone Encounter (Signed)
 Patient advised.
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
## 2024-01-13 DIAGNOSIS — F331 Major depressive disorder, recurrent, moderate: Secondary | ICD-10-CM | POA: Diagnosis not present

## 2024-01-13 DIAGNOSIS — F4312 Post-traumatic stress disorder, chronic: Secondary | ICD-10-CM | POA: Diagnosis not present

## 2024-01-13 DIAGNOSIS — F411 Generalized anxiety disorder: Secondary | ICD-10-CM | POA: Diagnosis not present

## 2024-01-13 NOTE — Patient Instructions (Signed)
 Visit Information  Thank you for taking time to visit with me today. Please don't hesitate to contact me if I can be of assistance to you before our next scheduled appointment.  Your next care management appointment is by telephone on 01/26/24 at 1:30am    Please call the care guide team at [phone] if you need to cancel, schedule, or reschedule an appointment.   Please call the Suicide and Crisis Lifeline: 988 call the [place]  National Suicide Prevention Lifeline: 5645536029 or TTY: (651)236-6460 TTY 812-850-5412) to talk to a trained counselor call 1-800-273-TALK (toll free, 24 hour hotline) if you are experiencing a Mental Health or Behavioral Health Crisis or need someone to talk to.  Alexsis Branscom, LCSW [place]  [facility], [facility] Licensed Clinical Social Worker  Direct Dial: [phone]

## 2024-01-19 DIAGNOSIS — M129 Arthropathy, unspecified: Secondary | ICD-10-CM | POA: Diagnosis not present

## 2024-01-19 DIAGNOSIS — Z79899 Other long term (current) drug therapy: Secondary | ICD-10-CM | POA: Diagnosis not present

## 2024-01-19 DIAGNOSIS — R0602 Shortness of breath: Secondary | ICD-10-CM | POA: Diagnosis not present

## 2024-01-19 DIAGNOSIS — M546 Pain in thoracic spine: Secondary | ICD-10-CM | POA: Diagnosis not present

## 2024-01-19 DIAGNOSIS — E7800 Pure hypercholesterolemia, unspecified: Secondary | ICD-10-CM | POA: Diagnosis not present

## 2024-01-19 DIAGNOSIS — Z131 Encounter for screening for diabetes mellitus: Secondary | ICD-10-CM | POA: Diagnosis not present

## 2024-01-19 DIAGNOSIS — M545 Low back pain, unspecified: Secondary | ICD-10-CM | POA: Diagnosis not present

## 2024-01-19 DIAGNOSIS — R109 Unspecified abdominal pain: Secondary | ICD-10-CM | POA: Diagnosis not present

## 2024-01-21 DIAGNOSIS — F4312 Post-traumatic stress disorder, chronic: Secondary | ICD-10-CM | POA: Diagnosis not present

## 2024-01-21 DIAGNOSIS — F411 Generalized anxiety disorder: Secondary | ICD-10-CM | POA: Diagnosis not present

## 2024-01-21 DIAGNOSIS — F331 Major depressive disorder, recurrent, moderate: Secondary | ICD-10-CM | POA: Diagnosis not present

## 2024-01-22 DIAGNOSIS — M546 Pain in thoracic spine: Secondary | ICD-10-CM | POA: Diagnosis not present

## 2024-01-22 DIAGNOSIS — M545 Low back pain, unspecified: Secondary | ICD-10-CM | POA: Diagnosis not present

## 2024-01-25 DIAGNOSIS — Z1211 Encounter for screening for malignant neoplasm of colon: Secondary | ICD-10-CM | POA: Diagnosis not present

## 2024-01-25 DIAGNOSIS — Z1212 Encounter for screening for malignant neoplasm of rectum: Secondary | ICD-10-CM | POA: Diagnosis not present

## 2024-01-26 ENCOUNTER — Other Ambulatory Visit: Payer: Self-pay | Admitting: *Deleted

## 2024-01-26 NOTE — Patient Instructions (Signed)
 Visit Information  Thank you for taking time to visit with me today. Please don't hesitate to contact me if I can be of assistance.   Closing From: Child psychotherapist, patient to be referred to [facility] for chronic care management  Please call the care guide team at [phone] if you need to cancel, schedule, or reschedule an appointment.   Please call the Suicide and Crisis Lifeline: 988 call the [place]  National Suicide Prevention Lifeline: 806-018-7949 or TTY: 408-815-7253 TTY 619-155-1149) to talk to a trained counselor call 1-800-273-TALK (toll free, 24 hour hotline) if you are experiencing a Mental Health or Behavioral Health Crisis or need someone to talk to.  Kham Zuckerman, LCSW [place]  [facility], [facility] Licensed Clinical Social Worker  Direct Dial: [phone]
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
## 2024-01-28 ENCOUNTER — Telehealth: Payer: Self-pay | Admitting: Family
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
## 2024-01-28 NOTE — Telephone Encounter (Signed)
 Called to confirm/remind patient of their appointment at the [facility] on 01/29/24.   Appointment:   [x] Confirmed  [] Left mess   [] No answer/No voice mail  [] VM Full/unable to leave message  [] Phone not in service  Patient reminded to bring all medications and/or complete list.  Confirmed patient has transportation. Gave directions, instructed to utilize valet parking.

## 2024-01-29 ENCOUNTER — Encounter: Payer: Self-pay | Admitting: Family

## 2024-01-31 LAB — COLOGUARD: COLOGUARD: NEGATIVE

## 2024-02-02 ENCOUNTER — Other Ambulatory Visit: Payer: Self-pay

## 2024-02-02 DIAGNOSIS — J309 Allergic rhinitis, unspecified: Secondary | ICD-10-CM | POA: Diagnosis not present

## 2024-02-02 DIAGNOSIS — Z87891 Personal history of nicotine dependence: Secondary | ICD-10-CM | POA: Diagnosis not present

## 2024-02-02 DIAGNOSIS — G4719 Other hypersomnia: Secondary | ICD-10-CM | POA: Diagnosis not present

## 2024-02-02 DIAGNOSIS — J4489 Other specified chronic obstructive pulmonary disease: Secondary | ICD-10-CM | POA: Diagnosis not present

## 2024-02-02 DIAGNOSIS — R0602 Shortness of breath: Secondary | ICD-10-CM | POA: Diagnosis not present

## 2024-02-02 MED ORDER — ALBUTEROL SULFATE HFA 108 (90 BASE) MCG/ACT IN AERS
2.0000 | INHALATION_SPRAY | RESPIRATORY_TRACT | 6 refills | Status: AC | PRN
  Filled 2024-02-02 – 2024-02-08 (×2): qty 18, 17d supply, fill #0
  Filled 2024-05-20: qty 18, 17d supply, fill #1

## 2024-02-02 MED ORDER — BUDESONIDE-FORMOTEROL FUMARATE 160-4.5 MCG/ACT IN AERO
2.0000 | INHALATION_SPRAY | Freq: Two times a day (BID) | RESPIRATORY_TRACT | 6 refills | Status: AC
  Filled 2024-02-02 – 2024-02-08 (×2): qty 10.2, 30d supply, fill #0
  Filled 2024-03-21: qty 10.2, 30d supply, fill #1

## 2024-02-04 ENCOUNTER — Other Ambulatory Visit (HOSPITAL_COMMUNITY): Payer: Self-pay

## 2024-02-04 ENCOUNTER — Other Ambulatory Visit: Payer: Self-pay

## 2024-02-04 DIAGNOSIS — F331 Major depressive disorder, recurrent, moderate: Secondary | ICD-10-CM | POA: Diagnosis not present

## 2024-02-04 DIAGNOSIS — F4312 Post-traumatic stress disorder, chronic: Secondary | ICD-10-CM | POA: Diagnosis not present

## 2024-02-04 DIAGNOSIS — F411 Generalized anxiety disorder: Secondary | ICD-10-CM | POA: Diagnosis not present

## 2024-02-05 ENCOUNTER — Other Ambulatory Visit (HOSPITAL_COMMUNITY): Payer: Self-pay

## 2024-02-08 ENCOUNTER — Other Ambulatory Visit: Payer: Self-pay

## 2024-02-08 ENCOUNTER — Other Ambulatory Visit (HOSPITAL_COMMUNITY): Payer: Self-pay

## 2024-02-10 NOTE — Progress Notes (Signed)
 Pt has appt 02/22/24 with [name], FNP and can have form completed and be assessed for clearance at appt.

## 2024-02-11 DIAGNOSIS — F331 Major depressive disorder, recurrent, moderate: Secondary | ICD-10-CM | POA: Diagnosis not present

## 2024-02-11 DIAGNOSIS — F4312 Post-traumatic stress disorder, chronic: Secondary | ICD-10-CM | POA: Diagnosis not present

## 2024-02-11 DIAGNOSIS — F411 Generalized anxiety disorder: Secondary | ICD-10-CM | POA: Diagnosis not present

## 2024-02-18 DIAGNOSIS — F411 Generalized anxiety disorder: Secondary | ICD-10-CM | POA: Diagnosis not present

## 2024-02-18 DIAGNOSIS — F331 Major depressive disorder, recurrent, moderate: Secondary | ICD-10-CM | POA: Diagnosis not present

## 2024-02-18 DIAGNOSIS — F4312 Post-traumatic stress disorder, chronic: Secondary | ICD-10-CM | POA: Diagnosis not present

## 2024-02-19 ENCOUNTER — Telehealth: Payer: Self-pay | Admitting: Family

## 2024-02-19 NOTE — Telephone Encounter (Signed)
 Called to confirm/remind patient of their appointment at the [facility] on 02/22/24.   Appointment:   [x] Confirmed  [] Left mess   [] No answer/No voice mail  [] VM Full/unable to leave message  [] Phone not in service  Patient reminded to bring all medications and/or complete list.  Confirmed patient has transportation. Gave directions, instructed to utilize valet parking.

## 2024-02-22 ENCOUNTER — Encounter: Payer: Self-pay | Admitting: Family

## 2024-02-29 ENCOUNTER — Telehealth: Payer: Self-pay | Admitting: Family

## 2024-02-29 NOTE — Telephone Encounter (Signed)
 Called to confirm/remind patient of their appointment at the [facility] on 03/01/24.   Appointment:   [x] Confirmed  [] Left mess   [] No answer/No voice mail  [] VM Full/unable to leave message  [] Phone not in service  Patient reminded to bring all medications and/or complete list.  Confirmed patient has transportation. Gave directions, instructed to utilize valet parking.

## 2024-03-01 ENCOUNTER — Ambulatory Visit: Payer: Self-pay | Admitting: Family

## 2024-03-08 DIAGNOSIS — I5022 Chronic systolic (congestive) heart failure: Secondary | ICD-10-CM | POA: Diagnosis not present

## 2024-03-08 DIAGNOSIS — Z87828 Personal history of other (healed) physical injury and trauma: Secondary | ICD-10-CM | POA: Diagnosis not present

## 2024-03-08 DIAGNOSIS — N1831 Chronic kidney disease, stage 3a: Secondary | ICD-10-CM | POA: Diagnosis not present

## 2024-03-10 DIAGNOSIS — F331 Major depressive disorder, recurrent, moderate: Secondary | ICD-10-CM | POA: Diagnosis not present

## 2024-03-10 DIAGNOSIS — F4312 Post-traumatic stress disorder, chronic: Secondary | ICD-10-CM | POA: Diagnosis not present

## 2024-03-10 DIAGNOSIS — F411 Generalized anxiety disorder: Secondary | ICD-10-CM | POA: Diagnosis not present
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
## 2024-03-15 ENCOUNTER — Ambulatory Visit: Payer: Self-pay | Attending: Family | Admitting: Family

## 2024-03-15 ENCOUNTER — Encounter: Payer: Self-pay | Admitting: Family

## 2024-03-15 VITALS — BP 117/64 | HR 72 | Wt 188.6 lb

## 2024-03-15 DIAGNOSIS — R0602 Shortness of breath: Secondary | ICD-10-CM | POA: Diagnosis not present

## 2024-03-15 DIAGNOSIS — I5022 Chronic systolic (congestive) heart failure: Secondary | ICD-10-CM | POA: Diagnosis not present

## 2024-03-15 DIAGNOSIS — I10 Essential (primary) hypertension: Secondary | ICD-10-CM

## 2024-03-15 DIAGNOSIS — I7121 Aneurysm of the ascending aorta, without rupture: Secondary | ICD-10-CM | POA: Diagnosis not present

## 2024-03-15 DIAGNOSIS — E119 Type 2 diabetes mellitus without complications: Secondary | ICD-10-CM | POA: Diagnosis not present

## 2024-03-15 DIAGNOSIS — I351 Nonrheumatic aortic (valve) insufficiency: Secondary | ICD-10-CM | POA: Diagnosis not present

## 2024-03-15 DIAGNOSIS — I428 Other cardiomyopathies: Secondary | ICD-10-CM | POA: Insufficient documentation

## 2024-03-15 DIAGNOSIS — R609 Edema, unspecified: Secondary | ICD-10-CM | POA: Insufficient documentation

## 2024-03-15 DIAGNOSIS — Q231 Congenital insufficiency of aortic valve: Secondary | ICD-10-CM | POA: Diagnosis not present

## 2024-03-15 DIAGNOSIS — F1910 Other psychoactive substance abuse, uncomplicated: Secondary | ICD-10-CM

## 2024-03-15 DIAGNOSIS — Z79899 Other long term (current) drug therapy: Secondary | ICD-10-CM | POA: Diagnosis not present

## 2024-03-15 DIAGNOSIS — I110 Hypertensive heart disease with heart failure: Secondary | ICD-10-CM | POA: Insufficient documentation

## 2024-03-15 DIAGNOSIS — Q2381 Bicuspid aortic valve: Secondary | ICD-10-CM | POA: Diagnosis not present

## 2024-03-15 DIAGNOSIS — R0683 Snoring: Secondary | ICD-10-CM | POA: Diagnosis not present

## 2024-03-15 NOTE — Patient Instructions (Addendum)
 Medication Changes:  FOR 3 DAYS ONLY take Furosemide  30mg  (1.5 tabs) then resume your normal 1/2 tab daily.  TAKE Entresto  1/2 tab two times daily.  Lab Work:   Go downstairs to [facility] on [address] to have your blood work completed.  We will only call you if the results are abnormal or if the provider would like to make medication changes.  No news is good news.  Special Instruction: [facility]. They will contact you in order to schedule your appointment.   Follow-Up in: Please follow up with the [facility] in 1 month with [name], FNP.   Thank you for choosing [facility].    At the [facility], you and your health needs are our priority. We have a designated team specialized in the treatment of Heart Failure. This Care Team includes your primary Heart Failure Specialized Cardiologist (physician), Advanced Practice Providers (APPs- Physician Assistants and Nurse Practitioners), and Pharmacist who all work together to provide you with the care you need, when you need it.   You may see any of the following providers on your designated Care Team at your next follow up:  Dr. Toribio Fuel Dr. Ezra Shuck Dr. Ria Commander Dr. Morene Brownie [name], FNP Jaun Bash, RPH-CPP  Please be sure to bring in all your medications bottles to every appointment.   Need to Contact Us :  If you have any questions or concerns before your next appointment please send us  a message through [place] or call our office at [phone].    TO LEAVE A MESSAGE FOR THE NURSE SELECT OPTION 2, PLEASE LEAVE A MESSAGE INCLUDING: YOUR NAME DATE OF BIRTH CALL BACK NUMBER REASON FOR CALL**this is important as we prioritize the call backs  YOU WILL RECEIVE A CALL BACK THE SAME DAY AS LONG AS YOU CALL BEFORE 4:00 PM

## 2024-03-15 NOTE — Progress Notes (Signed)
 ReDS Vest / Clip - 03/15/24 1400       ReDS Vest / Clip   Station Marker D    Ruler Value 29    ReDS Value Range Moderate volume overload    ReDS Actual Value 39

## 2024-03-17 ENCOUNTER — Telehealth: Payer: Self-pay

## 2024-03-17 DIAGNOSIS — F4312 Post-traumatic stress disorder, chronic: Secondary | ICD-10-CM | POA: Diagnosis not present

## 2024-03-17 DIAGNOSIS — F331 Major depressive disorder, recurrent, moderate: Secondary | ICD-10-CM | POA: Diagnosis not present

## 2024-03-17 DIAGNOSIS — F411 Generalized anxiety disorder: Secondary | ICD-10-CM | POA: Diagnosis not present

## 2024-03-17 NOTE — Telephone Encounter (Signed)
 Called pt to make aware of date, time and location of sleep study. Pt agreeable. No further questions at this time

## 2024-03-21 ENCOUNTER — Other Ambulatory Visit: Payer: Self-pay

## 2024-03-21 ENCOUNTER — Other Ambulatory Visit: Payer: Self-pay | Admitting: Family Medicine

## 2024-03-21 ENCOUNTER — Other Ambulatory Visit (HOSPITAL_COMMUNITY): Payer: Self-pay

## 2024-03-21 ENCOUNTER — Telehealth: Payer: Self-pay | Admitting: Family

## 2024-03-21 DIAGNOSIS — M792 Neuralgia and neuritis, unspecified: Secondary | ICD-10-CM

## 2024-03-21 DIAGNOSIS — I5032 Chronic diastolic (congestive) heart failure: Secondary | ICD-10-CM

## 2024-03-21 DIAGNOSIS — Z87828 Personal history of other (healed) physical injury and trauma: Secondary | ICD-10-CM

## 2024-03-21 DIAGNOSIS — M545 Low back pain, unspecified: Secondary | ICD-10-CM

## 2024-03-21 MED ORDER — SACUBITRIL-VALSARTAN 24-26 MG PO TABS
ORAL_TABLET | ORAL | 1 refills | Status: AC
  Filled 2024-03-21 – 2024-03-25 (×2): qty 90, 90d supply, fill #0
  Filled 2024-05-02: qty 90, 90d supply, fill #1

## 2024-03-22 ENCOUNTER — Other Ambulatory Visit (HOSPITAL_COMMUNITY): Payer: Self-pay

## 2024-03-22 ENCOUNTER — Other Ambulatory Visit: Payer: Self-pay

## 2024-03-22 MED ORDER — PREGABALIN 75 MG PO CAPS
75.0000 mg | ORAL_CAPSULE | Freq: Two times a day (BID) | ORAL | 1 refills | Status: AC
  Filled 2024-03-22: qty 60, 30d supply, fill #0

## 2024-03-22 NOTE — Telephone Encounter (Signed)
 LOV- 12/23/2023 NOV- 04/11/2024 LRF- 10/20/2023 Outpatient Medication Detail   Disp Refills Start End   methocarbamol  (ROBAXIN ) 500 MG tablet 20 tablet 0 10/20/2023 --   Sig - Route: Take 1 tablet (500 mg total) by mouth 2 (two) times daily. - Oral   Sent to pharmacy as: methocarbamol  (ROBAXIN ) 500 MG tablet

## 2024-03-23 ENCOUNTER — Other Ambulatory Visit (HOSPITAL_COMMUNITY): Payer: Self-pay

## 2024-03-23 MED ORDER — METHOCARBAMOL 500 MG PO TABS
500.0000 mg | ORAL_TABLET | Freq: Two times a day (BID) | ORAL | 0 refills | Status: AC
  Filled 2024-03-23: qty 20, 10d supply, fill #0

## 2024-03-25 ENCOUNTER — Other Ambulatory Visit: Payer: Self-pay

## 2024-03-25 ENCOUNTER — Other Ambulatory Visit (HOSPITAL_COMMUNITY): Payer: Self-pay

## 2024-03-28 ENCOUNTER — Ambulatory Visit: Admitting: Family Medicine

## 2024-04-07 DIAGNOSIS — F4312 Post-traumatic stress disorder, chronic: Secondary | ICD-10-CM | POA: Diagnosis not present

## 2024-04-07 DIAGNOSIS — F331 Major depressive disorder, recurrent, moderate: Secondary | ICD-10-CM | POA: Diagnosis not present

## 2024-04-07 DIAGNOSIS — F411 Generalized anxiety disorder: Secondary | ICD-10-CM | POA: Diagnosis not present

## 2024-04-11 ENCOUNTER — Ambulatory Visit: Admitting: Family Medicine

## 2024-04-14 ENCOUNTER — Telehealth: Payer: Self-pay | Admitting: Family

## 2024-04-14 NOTE — Telephone Encounter (Signed)
 Called to confirm/remind patient of their appointment at the [facility] on 04/15/24.   Appointment:   [x] Confirmed  [] Left mess   [] No answer/No voice mail  [] VM Full/unable to leave message  [] Phone not in service  Patient reminded to bring all medications and/or complete list.  Confirmed patient has transportation. Gave directions, instructed to utilize valet parking.

## 2024-04-15 ENCOUNTER — Ambulatory Visit: Admitting: Family

## 2024-04-15 ENCOUNTER — Telehealth: Payer: Self-pay | Admitting: Family

## 2024-04-15 NOTE — Telephone Encounter (Signed)
 Patient did not show for his Heart Failure Clinic appointment on 04/15/24.

## 2024-04-15 NOTE — Progress Notes (Deleted)
" ° °  ADVANCED HEART FAILURE FOLLOW UP CLINIC NOTE  Referring Physician: Donzella Lauraine SAILOR, DO  Primary Care: Donzella Lauraine SAILOR, DO Primary Cardiologist:  Chief Complaint:    HPI: Darius [name] is a 53 y.o. male who presents for follow up of CHF/NICM, bicuspid aortic valve with severe AI, polysubstance abuse (cocaine, ETOH, tobacco, marijuana), DM II.  Echo 11/2017: EF 60-65%, bicuspid aortic valve with mild to moderate AI   Echo 5/23: EF 40-45%, LV moderate to severely dilated, mild LVH, mild to moderate AI, bright speckled myocardium raising concern for infiltrative process, mildly reduced RV   cMRI 11/08/21: Severely dilated LV with EF 31%, severely dilated RV with moderately reduced function (RVEF 30%), nonspecific RV insertion LGE which can be seen in setting of elevated pulmonary pressures, bicuspid aortic valve with severe AI, dilated ascending aorta measuring 42 mm.  Multiple admissions in the interim. Has been lost to follow up from a CT surgery standpoint.   Echo 10/16/23: EF 45-50%, mild LVH, G1DD, normal RV, normal PA pressure, mild MR, severe AR, normal IVC  SUBJECTIVE:  He presents today for a HF follow-up visit with a chief complaint of   Reports that he is following up with a dentist and should have his evaluation completed soon.  He has been in contact with Dr. Lang office and was instructed to call them the moment he was cleared by dentistry.    PMH, current medications, allergies, social history, and family history reviewed in epic.   PHYSICAL EXAM:  General: Well appearing, intermittently SOB while talking.  Cor: No JVD. Regular rhythm, rate. Loud diastolic murmur best heard at the RUSB. Lungs: rales LUL, otherwise clear Abdomen: soft, nontender, nondistended. Extremities: trace pitting edema BLE to mid-shins Neuro:. Affect pleasant        ASSESSMENT & PLAN:  Chronic systolic heart failure: Suspect primarily valvular, though may have an element of  prior drug and alcohol use.  Prior cardiac MRI with EF 31%, now 45 to 50% while off drugs. NYHA class III  -  Continue furosemide  10mg  daily.  - Continue entresto  1/2 tab 24/26mg  BID. Emphasized taking it BID, otherwise, will switch to valsartan .  - Would avoid beta-blocker with severe aortic regurgitation   - Jardiance  stopped due to previous recent yeast infection - Surgical evaluation as below - BNP 07/27/23 was 158.3  Aortic insufficiency/bicuspid aortic valve - Ongoing discussion with CT surgery, hopefully after dentistry eval - Will be moderate risk from a cardiac standpoint, reassuring that his ejection fraction is improved, but will likely benefit from hemodynamic optimization prior to procedure - Avoid any and all beta-blockers at this point in time  Ascending aortic aneurysm - Followed by CT surgery, mild  HTN: - BP  - BMET 12/23/23 reviewed: sodium 141, potassium 4.2, creatinine 1.49, GFR 55.  - He did not get labs drawn per last visit, will get BMP today  Polysubstance use:  - Drug free, occasional alcohol use - Congratulated  Snoring: - Order placed for in facility sleep study to rule out OSA     Ellouise DELENA Class FNP-C 04/15/2024 "

## 2024-04-25 ENCOUNTER — Telehealth: Payer: Self-pay

## 2024-04-25 NOTE — Telephone Encounter (Unsigned)
 Pt called wanting to move forward on his open heart procedure. Per tcts pt will need to have an updated echo and cath as well as a new referral since it has been 2 years since the pt has been seen at their practice. Scheduled pt for echo and MD appt. Advised pt that this appointment is very important if he wants to move forward with his surgery. Advised not to reschedule or no show appt. Pt states that he is frustrated that he has to start all over but is willing to continue the process. Pt states that his health is worrying his mother and wife and that is his main motivating factors.

## 2024-04-26 ENCOUNTER — Telehealth: Payer: Self-pay

## 2024-04-26 NOTE — Telephone Encounter (Signed)
 Pt called requesting dental clearance so that he may have the dental procedures in order to have his open heart surgery. States he can not reach his dental office. Called [facility] in [place]. Location is closed. Other locations unable to access his records. Pt was seen by [facility] who told him that they would not take his insurance. They advised that he would need to be seen by [facility] for his insurance. Pt states that he has transportation issues and is not able to get to [facility].

## 2024-05-02 ENCOUNTER — Other Ambulatory Visit (HOSPITAL_COMMUNITY): Payer: Self-pay

## 2024-05-02 DIAGNOSIS — E559 Vitamin D deficiency, unspecified: Secondary | ICD-10-CM

## 2024-05-02 DIAGNOSIS — I5032 Chronic diastolic (congestive) heart failure: Secondary | ICD-10-CM

## 2024-05-02 MED ORDER — FUROSEMIDE 20 MG PO TABS
10.0000 mg | ORAL_TABLET | Freq: Every day | ORAL | 3 refills | Status: AC
  Filled 2024-05-02: qty 15, 30d supply, fill #0
  Filled 2024-05-20: qty 15, 30d supply, fill #1

## 2024-05-02 NOTE — Telephone Encounter (Signed)
 LOV- 12/23/2023 NOV- None LRF- 12/23/2023 Outpatient Medication Detail   Disp Refills Start End   furosemide  (LASIX ) 20 MG tablet 15 tablet 1 12/23/2023 --   Sig - Route: Take 1/2 tablet (10 mg total) by mouth daily. - Oral   Sent to pharmacy as: furosemide  (LASIX ) 20 MG tablet   Notes to Pharmacy: Please refill early due to leaving rehab     12/29/2023 Outpatient Medication Detail   Disp Refills Start End   Vitamin D , Ergocalciferol , (DRISDOL ) 1.25 MG (50000 UNIT) CAPS capsule 12 capsule 1 12/29/2023 --   Sig - Route: Take 1 capsule (50,000 Units total) by mouth every 7 (seven) days. - Oral   Sent to pharmacy as: Vitamin D , Ergocalciferol , (DRISDOL ) 1.25 MG (50000 UNIT) Cap capsule

## 2024-05-03 ENCOUNTER — Other Ambulatory Visit (HOSPITAL_COMMUNITY): Payer: Self-pay

## 2024-05-03 ENCOUNTER — Other Ambulatory Visit: Payer: Self-pay

## 2024-05-04 ENCOUNTER — Other Ambulatory Visit (HOSPITAL_COMMUNITY): Payer: Self-pay

## 2024-05-10 ENCOUNTER — Other Ambulatory Visit (HOSPITAL_COMMUNITY): Payer: Self-pay | Admitting: *Deleted

## 2024-05-10 DIAGNOSIS — I5022 Chronic systolic (congestive) heart failure: Secondary | ICD-10-CM

## 2024-05-10 DIAGNOSIS — Q231 Congenital insufficiency of aortic valve: Secondary | ICD-10-CM

## 2024-05-11 ENCOUNTER — Telehealth (HOSPITAL_COMMUNITY): Payer: Self-pay | Admitting: Cardiology

## 2024-05-11 NOTE — Telephone Encounter (Signed)
 Called to confirm/remind patient of their appointment at the [facility] on 05/11/2024.   Appointment:   [x] Confirmed  [] Left mess   [] No answer/No voice mail  [] VM Full/unable to leave message  [] Phone not in service  Patient reminded to bring all medications and/or complete list.  Confirmed patient has transportation. Gave directions, instructed to utilize valet parking.

## 2024-05-12 ENCOUNTER — Ambulatory Visit (HOSPITAL_COMMUNITY)
Admission: RE | Admit: 2024-05-12 | Discharge: 2024-05-12 | Disposition: A | Discharge status: Home / Self Care | Source: Ambulatory Visit | Attending: Cardiology | Admitting: Cardiology

## 2024-05-12 ENCOUNTER — Encounter (HOSPITAL_COMMUNITY): Payer: Self-pay | Admitting: Cardiology

## 2024-05-12 ENCOUNTER — Other Ambulatory Visit (HOSPITAL_COMMUNITY): Payer: Self-pay

## 2024-05-12 VITALS — BP 110/60 | HR 70 | Ht 73.0 in | Wt 188.6 lb

## 2024-05-12 DIAGNOSIS — Q231 Congenital insufficiency of aortic valve: Secondary | ICD-10-CM

## 2024-05-12 DIAGNOSIS — I5022 Chronic systolic (congestive) heart failure: Secondary | ICD-10-CM | POA: Diagnosis not present

## 2024-05-12 DIAGNOSIS — I5042 Chronic combined systolic (congestive) and diastolic (congestive) heart failure: Secondary | ICD-10-CM | POA: Diagnosis not present

## 2024-05-12 DIAGNOSIS — Q2381 Bicuspid aortic valve: Secondary | ICD-10-CM | POA: Insufficient documentation

## 2024-05-12 DIAGNOSIS — Z7984 Long term (current) use of oral hypoglycemic drugs: Secondary | ICD-10-CM | POA: Diagnosis not present

## 2024-05-12 DIAGNOSIS — I7121 Aneurysm of the ascending aorta, without rupture: Secondary | ICD-10-CM | POA: Diagnosis not present

## 2024-05-12 DIAGNOSIS — R0602 Shortness of breath: Secondary | ICD-10-CM | POA: Insufficient documentation

## 2024-05-12 DIAGNOSIS — I110 Hypertensive heart disease with heart failure: Secondary | ICD-10-CM | POA: Insufficient documentation

## 2024-05-12 DIAGNOSIS — I10 Essential (primary) hypertension: Secondary | ICD-10-CM | POA: Diagnosis not present

## 2024-05-12 DIAGNOSIS — Z79899 Other long term (current) drug therapy: Secondary | ICD-10-CM | POA: Insufficient documentation

## 2024-05-12 LAB — BASIC METABOLIC PANEL WITH GFR
Anion gap: 10 (ref 5–15)
BUN: 13 mg/dL (ref 6–20)
CO2: 25 mmol/L (ref 22–32)
Calcium: 9.3 mg/dL (ref 8.9–10.3)
Chloride: 106 mmol/L (ref 98–111)
Creatinine, Ser: 1.31 mg/dL — ABNORMAL HIGH (ref 0.61–1.24)
GFR, Estimated: >60 mL/min
Glucose, Bld: 85 mg/dL (ref 70–99)
Potassium: 4.1 mmol/L (ref 3.5–5.1)
Sodium: 141 mmol/L (ref 135–145)

## 2024-05-12 LAB — ECHOCARDIOGRAM COMPLETE
AR max vel: 2.53 cm2
AV Area VTI: 2.47 cm2
AV Area mean vel: 2.42 cm2
AV Mean grad: 11.8 mmHg
AV Peak grad: 20.3 mmHg
Ao pk vel: 2.25 m/s
Area-P 1/2: 4.39 cm2
Calc EF: 40.3 %
P 1/2 time: 129 ms
S' Lateral: 4.9 cm
Single Plane A2C EF: 38.5 %
Single Plane A4C EF: 40.4 %

## 2024-05-12 LAB — CBC
HCT: 41.2 % (ref 39.0–52.0)
Hemoglobin: 14.5 g/dL (ref 13.0–17.0)
MCH: 33.3 pg (ref 26.0–34.0)
MCHC: 35.2 g/dL (ref 30.0–36.0)
MCV: 94.5 fL (ref 80.0–100.0)
Platelets: 221 K/uL (ref 150–400)
RBC: 4.36 MIL/uL (ref 4.22–5.81)
RDW: 12.1 % (ref 11.5–15.5)
WBC: 4.2 K/uL (ref 4.0–10.5)
nRBC: 0 % (ref 0.0–0.2)

## 2024-05-12 LAB — PRO BRAIN NATRIURETIC PEPTIDE: Pro Brain Natriuretic Peptide: 879 pg/mL — ABNORMAL HIGH

## 2024-05-12 NOTE — Patient Instructions (Signed)
 There has been no changes to your medications.  Labs done today, your results will be available in MyChart, we will contact you for abnormal readings.  You are scheduled for a Cardiac Catheterization on Monday, January 26 with Dr. Odis Brownie.  1. Please arrive at the [facility] (Main Entrance A) at [facility]: [address] [place], [place] [address] at 7:30 AM (This time is 2 hour(s) before your procedure to ensure your preparation).   Free valet parking service is available. You will check in at ADMITTING. The support person will be asked to wait in the waiting room.  It is OK to have someone drop you off and come back when you are ready to be discharged.    Special note: Every effort is made to have your procedure done on time. Please understand that emergencies sometimes delay scheduled procedures.  2. Diet: Nothing to eat after midnight.  3. Medication instructions in preparation for your procedure:   Contrast Allergy: No  HOLD YOUR LASIX  THE MORNING OF YOUR PROCEDURE.  On the morning of your procedure, take any morning medicines NOT listed above.  You may use sips of water .  6. Plan to go home the same day, you will only stay overnight if medically necessary. 7. Bring a current list of your medications and current insurance cards. 8. You MUST have a responsible person to drive you home. 9. Someone MUST be with you the first 24 hours after you arrive home or your discharge will be delayed. 10. Please wear clothes that are easy to get on and off and wear slip-on shoes.  Your physician recommends that you schedule a follow-up appointment in: 3 MONTHS.  If you have any questions or concerns before your next appointment please send us  a message through [place] or call our office at [phone].    TO LEAVE A MESSAGE FOR THE NURSE SELECT OPTION 2, PLEASE LEAVE A MESSAGE INCLUDING: YOUR NAME DATE OF BIRTH CALL BACK NUMBER REASON FOR CALL**this is important as we  prioritize the call backs  YOU WILL RECEIVE A CALL BACK THE SAME DAY AS LONG AS YOU CALL BEFORE 4:00 PM  At the [facility], you and your health needs are our priority. As part of our continuing mission to provide you with exceptional heart care, we have created designated Provider Care Teams. These Care Teams include your primary Cardiologist (physician) and Advanced Practice Providers (APPs- Physician Assistants and Nurse Practitioners) who all work together to provide you with the care you need, when you need it.   You may see any of the following providers on your designated Care Team at your next follow up: Dr Toribio Fuel Dr Ezra Shuck Dr. Morene Brownie Greig Mosses, NP Caffie Shed, [place] [facility] [place], [place] Beckey Coe, NP Jordan Lee, NP [name], NP Tinnie Redman, PharmD Jaun Bash, PharmD   Please be sure to bring in all your medications bottles to every appointment.    Thank you for choosing [facility]

## 2024-05-15 NOTE — H&P (View-Only) (Signed)
 "  ADVANCED HEART FAILURE FOLLOW UP CLINIC NOTE  Referring Physician: Donzella Lauraine SAILOR, DO  Primary Care: Donzella Lauraine SAILOR, DO Primary Cardiologist:  HPI: Darius [name] is a 54 y.o. male who presents for follow up of severe aortic regurgitation.      Echo 11/2017: EF 60-65%, bicuspid aortic valve with mild to moderate AI   Echo 5/23: EF 40-45%, LV moderate to severely dilated, mild LVH, mild to moderate AI, bright speckled myocardium raising concern for infiltrative process, mildly reduced RV   cMRI 11/08/21: Severely dilated LV with EF 31%, severely dilated RV with moderately reduced function (RVEF 30%), nonspecific RV insertion LGE which can be seen in setting of elevated pulmonary pressures, bicuspid aortic valve with severe AI, dilated ascending aorta measuring 42 mm.  Multiple admissions in the interim. Has been lost to follow up from a CT surgery standpoint.      SUBJECTIVE:  Has had multiple issues with getting in to dentistry, clinics have closed despite him trying to get in for months.  He reports worsening shortness of breath with exertion and is frustrated by the lack of progress in his valvular issue.  Has been sober for some time.  Denies any active chest pain.  PMH, current medications, allergies, social history, and family history reviewed in epic.  PHYSICAL EXAM: Vitals:   05/12/24 1141  BP: 110/60  Pulse: 70  SpO2: 97%   GENERAL: Well nourished and in no apparent distress at rest.  PULM:  Normal work of breathing, clear to auscultation bilaterally. Respirations are unlabored.  CARDIAC:  JVP: Flat         Regular rate and rhythm, loud diastolic murmur best heard over the right upper sternal border, bounding pulses ABDOMEN: Soft, non-tender, non-distended. NEUROLOGIC: Patient is oriented x3 with no focal or lateralizing neurologic deficits.     ASSESSMENT & PLAN:  Chronic systolic heart failure: Suspect primarily valvular, though may have an element of prior  drug and alcohol use.  Prior cardiac MRI with EF 31%, now 40 to 50% while off drugs.  Worsening symptoms, NYHA class IIIb.  Per CT surgery needs repeat heart catheterization and echocardiogram prior to visit.  Will arrange for cardiac catheterization, may need inpatient dental evaluation given difficulty as an outpatient -Right and left heart catheterization -Potential admission for optimization and dental evaluation if needed -No beta-blocker with severe aortic regurgitation -Off Jardiance  with prior yeast infection -Continue Entresto  12/13 mg twice daily -Continue Lasix  10 mg daily - Surgical evaluation as below  Aortic insufficiency/bicuspid aortic valve - Ongoing discussion with CT surgery, needs dentistry eval - Will be moderate risk from a cardiac standpoint, reassuring that his ejection fraction is improved, but will likely benefit from hemodynamic optimization prior to procedure -Right and left heart catheterization as above - Avoid any and all beta-blockers at this point in time  Ascending aortic aneurysm - Followed by CT surgery, mild  HTN: - Management as above  Polysubstance use:  -Drug free, occasional alcohol use - Congratulated  I have reviewed the risks, indications, and alternatives to cardiac catheterization +/- angioplasty or stenting with the patient. Risks include but are not limited to bleeding, infection, vascular injury, stroke, myocardial infection, arrhythmia, kidney injury, radiation-related injury in the case of prolonged fluoroscopy use, emergency cardiac surgery, and death. The patient understands the risks of serious complication is low (<1%) and he agrees to proceed.    Follow-up in 1 to 2 months  Morene Brownie, MD Advanced Heart Failure Mechanical Circulatory  Support 05/15/24 "

## 2024-05-15 NOTE — Progress Notes (Addendum)
 "  ADVANCED HEART FAILURE FOLLOW UP CLINIC NOTE  Referring Physician: Donzella Lauraine SAILOR, DO  Primary Care: Donzella Lauraine SAILOR, DO Primary Cardiologist:  HPI: Darius [name] is a 54 y.o. male who presents for follow up of severe aortic regurgitation.      Echo 11/2017: EF 60-65%, bicuspid aortic valve with mild to moderate AI   Echo 5/23: EF 40-45%, LV moderate to severely dilated, mild LVH, mild to moderate AI, bright speckled myocardium raising concern for infiltrative process, mildly reduced RV   cMRI 11/08/21: Severely dilated LV with EF 31%, severely dilated RV with moderately reduced function (RVEF 30%), nonspecific RV insertion LGE which can be seen in setting of elevated pulmonary pressures, bicuspid aortic valve with severe AI, dilated ascending aorta measuring 42 mm.  Multiple admissions in the interim. Has been lost to follow up from a CT surgery standpoint.      SUBJECTIVE:  Has had multiple issues with getting in to dentistry, clinics have closed despite him trying to get in for months.  He reports worsening shortness of breath with exertion and is frustrated by the lack of progress in his valvular issue.  Has been sober for some time.  Denies any active chest pain.  PMH, current medications, allergies, social history, and family history reviewed in epic.  PHYSICAL EXAM: Vitals:   05/12/24 1141  BP: 110/60  Pulse: 70  SpO2: 97%   GENERAL: Well nourished and in no apparent distress at rest.  PULM:  Normal work of breathing, clear to auscultation bilaterally. Respirations are unlabored.  CARDIAC:  JVP: Flat         Regular rate and rhythm, loud diastolic murmur best heard over the right upper sternal border, bounding pulses ABDOMEN: Soft, non-tender, non-distended. NEUROLOGIC: Patient is oriented x3 with no focal or lateralizing neurologic deficits.     ASSESSMENT & PLAN:  Chronic systolic heart failure: Suspect primarily valvular, though may have an element of prior  drug and alcohol use.  Prior cardiac MRI with EF 31%, now 40 to 50% while off drugs.  Worsening symptoms, NYHA class IIIb.  Per CT surgery needs repeat heart catheterization and echocardiogram prior to visit.  Will arrange for cardiac catheterization, may need inpatient dental evaluation given difficulty as an outpatient -Right and left heart catheterization -Potential admission for optimization and dental evaluation if needed -No beta-blocker with severe aortic regurgitation -Off Jardiance  with prior yeast infection -Continue Entresto  12/13 mg twice daily -Continue Lasix  10 mg daily - Surgical evaluation as below  Aortic insufficiency/bicuspid aortic valve - Ongoing discussion with CT surgery, needs dentistry eval - Will be moderate risk from a cardiac standpoint, reassuring that his ejection fraction is improved, but will likely benefit from hemodynamic optimization prior to procedure -Right and left heart catheterization as above - Avoid any and all beta-blockers at this point in time  Ascending aortic aneurysm - Followed by CT surgery, mild  HTN: - Management as above  Polysubstance use:  -Drug free, occasional alcohol use - Congratulated  I have reviewed the risks, indications, and alternatives to cardiac catheterization +/- angioplasty or stenting with the patient. Risks include but are not limited to bleeding, infection, vascular injury, stroke, myocardial infection, arrhythmia, kidney injury, radiation-related injury in the case of prolonged fluoroscopy use, emergency cardiac surgery, and death. The patient understands the risks of serious complication is low (<1%) and he agrees to proceed.    Follow-up in 1 to 2 months  Morene Brownie, MD Advanced Heart Failure Mechanical Circulatory  Support 05/15/24 "

## 2024-05-20 ENCOUNTER — Other Ambulatory Visit (HOSPITAL_BASED_OUTPATIENT_CLINIC_OR_DEPARTMENT_OTHER): Payer: Self-pay

## 2024-05-20 ENCOUNTER — Other Ambulatory Visit: Payer: Self-pay | Admitting: Family Medicine

## 2024-05-20 ENCOUNTER — Telehealth (HOSPITAL_COMMUNITY): Payer: Self-pay

## 2024-05-20 DIAGNOSIS — E559 Vitamin D deficiency, unspecified: Secondary | ICD-10-CM

## 2024-05-20 NOTE — Telephone Encounter (Signed)
 Requested medication (s) are due for refill today: yes  Requested medication (s) are on the active medication list: yes  Last refill:  12/29/23  Future visit scheduled: no  Notes to clinic:  Manual Review: Route requests for 50,000 IU strength to the provider      Requested Prescriptions  Pending Prescriptions Disp Refills   Vitamin D , Ergocalciferol , (DRISDOL ) 1.25 MG (50000 UNIT) CAPS capsule 12 capsule 1    Sig: Take 1 capsule (50,000 Units total) by mouth every 7 (seven) days.     Endocrinology:  Vitamins - Vitamin D  Supplementation 2 Failed - 05/20/2024  2:35 PM      Failed - Manual Review: Route requests for 50,000 IU strength to the provider      Failed - Vitamin D  in normal range and within 360 days    Vit D, 25-Hydroxy  Date Value Ref Range Status  12/23/2023 26.8 (L) 30.0 - 100.0 ng/mL Final    Comment:    Vitamin D  deficiency has been defined by the Institute of Medicine and an Endocrine Society practice guideline as a level of serum 25-OH vitamin D  less than 20 ng/mL (1,2). The Endocrine Society went on to further define vitamin D  insufficiency as a level between 21 and 29 ng/mL (2). 1. IOM (Institute of Medicine). 2010. Dietary reference    intakes for calcium and D. [place]: The    [employer]. 2. Holick MF, Binkley Mullin, Bischoff-Ferrari HA, et al.    Evaluation, treatment, and prevention of vitamin D     deficiency: an Endocrine Society clinical practice    guideline. JCEM. 2011 Jul; 96(7):1911-30.          Passed - Ca in normal range and within 360 days    Calcium  Date Value Ref Range Status  05/12/2024 9.3 8.9 - 10.3 mg/dL Final   Calcium, Ion  Date Value Ref Range Status  12/03/2021 1.30 1.15 - 1.40 mmol/L Final         Passed - Valid encounter within last 12 months    Recent Outpatient Visits           4 months ago Dental abscess   [facility] Pardue, Lauraine SAILOR, DO   7 months ago Dental caries   [facility] Gasper, Nancyann BRAVO, MD

## 2024-05-20 NOTE — Telephone Encounter (Signed)
 Called patient regarding RHC scheduled for  Monday with Dr. Zenaida.   Rescheduled procedure to 1/30 with Dr. Zenaida. Patient aware of procedure time and date, and location. Confirmed patient has someone to drive him and be with him 24 hours after procedure   Advised patient to call back to office with any issues, questions, or concerns. Patient verbalized understanding.

## 2024-05-25 ENCOUNTER — Ambulatory Visit (HOSPITAL_BASED_OUTPATIENT_CLINIC_OR_DEPARTMENT_OTHER): Attending: Family | Admitting: Cardiology

## 2024-05-25 VITALS — Ht 73.0 in | Wt 185.0 lb

## 2024-05-25 DIAGNOSIS — R0602 Shortness of breath: Secondary | ICD-10-CM | POA: Diagnosis present

## 2024-05-25 DIAGNOSIS — I5032 Chronic diastolic (congestive) heart failure: Secondary | ICD-10-CM | POA: Diagnosis not present

## 2024-05-25 DIAGNOSIS — I5022 Chronic systolic (congestive) heart failure: Secondary | ICD-10-CM | POA: Diagnosis not present

## 2024-05-25 DIAGNOSIS — G4733 Obstructive sleep apnea (adult) (pediatric): Secondary | ICD-10-CM | POA: Insufficient documentation

## 2024-05-25 DIAGNOSIS — R0683 Snoring: Secondary | ICD-10-CM | POA: Diagnosis not present

## 2024-05-27 ENCOUNTER — Ambulatory Visit (HOSPITAL_COMMUNITY)
Admission: RE | Admit: 2024-05-27 | Discharge: 2024-05-27 | Disposition: A | Discharge status: Home / Self Care | Attending: Cardiology | Admitting: Cardiology

## 2024-05-27 ENCOUNTER — Other Ambulatory Visit: Payer: Self-pay

## 2024-05-27 ENCOUNTER — Encounter (HOSPITAL_COMMUNITY)
Admission: RE | Disposition: A | Discharge status: Home / Self Care | Payer: Self-pay | Source: Home / Self Care | Attending: Cardiology

## 2024-05-27 DIAGNOSIS — I5022 Chronic systolic (congestive) heart failure: Secondary | ICD-10-CM | POA: Insufficient documentation

## 2024-05-27 DIAGNOSIS — Z79899 Other long term (current) drug therapy: Secondary | ICD-10-CM | POA: Insufficient documentation

## 2024-05-27 DIAGNOSIS — Q2381 Bicuspid aortic valve: Secondary | ICD-10-CM | POA: Insufficient documentation

## 2024-05-27 DIAGNOSIS — I5042 Chronic combined systolic (congestive) and diastolic (congestive) heart failure: Secondary | ICD-10-CM

## 2024-05-27 DIAGNOSIS — I110 Hypertensive heart disease with heart failure: Secondary | ICD-10-CM | POA: Diagnosis not present

## 2024-05-27 DIAGNOSIS — I7121 Aneurysm of the ascending aorta, without rupture: Secondary | ICD-10-CM | POA: Diagnosis not present

## 2024-05-27 DIAGNOSIS — I351 Nonrheumatic aortic (valve) insufficiency: Secondary | ICD-10-CM | POA: Insufficient documentation

## 2024-05-27 DIAGNOSIS — I509 Heart failure, unspecified: Secondary | ICD-10-CM | POA: Diagnosis present

## 2024-05-27 LAB — GLUCOSE, CAPILLARY
Glucose-Capillary: 62 mg/dL — ABNORMAL LOW (ref 70–99)
Glucose-Capillary: 126 mg/dL — ABNORMAL HIGH (ref 70–99)
Glucose-Capillary: 70 mg/dL (ref 70–99)

## 2024-05-27 LAB — POCT I-STAT EG7
Acid-base deficit: 1 mmol/L (ref 0.0–2.0)
Acid-base deficit: 2 mmol/L (ref 0.0–2.0)
Bicarbonate: 23.7 mmol/L (ref 20.0–28.0)
Bicarbonate: 23.4 mmol/L (ref 20.0–28.0)
Calcium, Ion: 1.24 mmol/L (ref 1.15–1.40)
Calcium, Ion: 1.21 mmol/L (ref 1.15–1.40)
HCT: 37 % — ABNORMAL LOW (ref 39.0–52.0)
HCT: 37 % — ABNORMAL LOW (ref 39.0–52.0)
Hemoglobin: 12.6 g/dL — ABNORMAL LOW (ref 13.0–17.0)
Hemoglobin: 12.6 g/dL — ABNORMAL LOW (ref 13.0–17.0)
O2 Saturation: 65 %
O2 Saturation: 65 %
Potassium: 4.2 mmol/L (ref 3.5–5.1)
Potassium: 4.1 mmol/L (ref 3.5–5.1)
Sodium: 141 mmol/L (ref 135–145)
Sodium: 141 mmol/L (ref 135–145)
TCO2: 25 mmol/L (ref 22–32)
TCO2: 25 mmol/L (ref 22–32)
pCO2, Ven: 40.4 mmHg — ABNORMAL LOW (ref 44–60)
pCO2, Ven: 39.3 mmHg — ABNORMAL LOW (ref 44–60)
pH, Ven: 7.377 (ref 7.25–7.43)
pH, Ven: 7.383 (ref 7.25–7.43)
pO2, Ven: 34 mmHg (ref 32–45)
pO2, Ven: 34 mmHg (ref 32–45)

## 2024-05-27 MED ORDER — HEPARIN (PORCINE) IN NACL 1000-0.9 UT/500ML-% IV SOLN
INTRAVENOUS | Status: DC | PRN
  Administered 2024-05-27 (×2): 500 mL

## 2024-05-27 MED ORDER — HEPARIN SODIUM (PORCINE) 1000 UNIT/ML IJ SOLN
INTRAMUSCULAR | Status: AC
  Filled 2024-05-27: qty 10

## 2024-05-27 MED ORDER — SODIUM CHLORIDE 0.9% FLUSH: 3.0000 mL | INTRAVENOUS | Status: DC | PRN

## 2024-05-27 MED ORDER — ASPIRIN 81 MG PO CHEW
CHEWABLE_TABLET | ORAL | Status: AC
  Filled 2024-05-27: qty 1

## 2024-05-27 MED ORDER — HEPARIN SODIUM (PORCINE) 1000 UNIT/ML IJ SOLN
INTRAMUSCULAR | Status: DC | PRN
  Administered 2024-05-27: 5000 [IU] via INTRAVENOUS

## 2024-05-27 MED ORDER — LIDOCAINE HCL (PF) 1 % IJ SOLN
INTRAMUSCULAR | Status: DC | PRN
  Administered 2024-05-27 (×2): 2 mL

## 2024-05-27 MED ORDER — SODIUM CHLORIDE 0.9% FLUSH: 3.0000 mL | Freq: Two times a day (BID) | INTRAVENOUS | Status: DC

## 2024-05-27 MED ORDER — MIDAZOLAM HCL 2 MG/2ML IJ SOLN
INTRAMUSCULAR | Status: AC
  Filled 2024-05-27: qty 2

## 2024-05-27 MED ORDER — DEXTROSE 50 % IV SOLN
INTRAVENOUS | Status: AC
  Filled 2024-05-27: qty 50

## 2024-05-27 MED ORDER — VERAPAMIL HCL 2.5 MG/ML IV SOLN
INTRAVENOUS | Status: DC | PRN
  Administered 2024-05-27: 10 mL via INTRA_ARTERIAL

## 2024-05-27 MED ORDER — LIDOCAINE HCL (PF) 1 % IJ SOLN
INTRAMUSCULAR | Status: AC
  Filled 2024-05-27: qty 30

## 2024-05-27 MED ORDER — DEXTROSE 50 % IV SOLN
12.5000 g | INTRAVENOUS | Status: AC
  Administered 2024-05-27: 12.5 g via INTRAVENOUS

## 2024-05-27 MED ORDER — FREE WATER: 250.0000 mL | Freq: Once | Status: DC

## 2024-05-27 MED ORDER — SODIUM CHLORIDE 0.9 % IV SOLN: 250.0000 mL | INTRAVENOUS | Status: DC | PRN

## 2024-05-27 MED ORDER — MIDAZOLAM HCL (PF) 2 MG/2ML IJ SOLN
INTRAMUSCULAR | Status: DC | PRN
  Administered 2024-05-27 (×2): 1 mg via INTRAVENOUS

## 2024-05-27 MED ORDER — IOHEXOL 350 MG/ML SOLN
INTRAVENOUS | Status: DC | PRN
  Administered 2024-05-27: 50 mL

## 2024-05-27 MED ORDER — ASPIRIN 81 MG PO CHEW
81.0000 mg | CHEWABLE_TABLET | Freq: Once | ORAL | Status: AC
  Administered 2024-05-27: 81 mg via ORAL

## 2024-05-27 MED ORDER — VERAPAMIL HCL 2.5 MG/ML IV SOLN
INTRAVENOUS | Status: AC
  Filled 2024-05-27: qty 2

## 2024-05-27 MED ORDER — FENTANYL CITRATE (PF) 100 MCG/2ML IJ SOLN
INTRAMUSCULAR | Status: AC
  Filled 2024-05-27: qty 2

## 2024-05-27 MED ORDER — FENTANYL CITRATE (PF) 100 MCG/2ML IJ SOLN
INTRAMUSCULAR | Status: DC | PRN
  Administered 2024-05-27 (×2): 25 ug via INTRAVENOUS

## 2024-05-27 NOTE — Interval H&P Note (Signed)
 History and Physical Interval Note:  05/27/2024 9:23 AM  Darius [name]  has presented today for surgery, with the diagnosis of chf.  The various methods of treatment have been discussed with the patient and family. After consideration of risks, benefits and other options for treatment, the patient has consented to  Procedures: RIGHT/LEFT HEART CATH AND CORONARY ANGIOGRAPHY (N/A) as a surgical intervention.  The patient's history has been reviewed, patient examined, no change in status, stable for surgery.  I have reviewed the patient's chart and labs.  Questions were answered to the patient's satisfaction.     [name]

## 2024-05-27 NOTE — Discharge Instructions (Signed)

## 2024-05-28 ENCOUNTER — Encounter (HOSPITAL_COMMUNITY): Payer: Self-pay | Admitting: Cardiology

## 2024-05-30 NOTE — Procedures (Signed)
 " Indications for Polysomnography The patient is a 54 year old Male who is 6' 1 and weighs 185.0 lbs.  His BMI equals 24.5.  A diagnostic polysomnogram was performed to evaluate for Obstructive Sleep Apnea.  After 126.5 minutes of sleep time the patient exhibited sufficient respiratory  events qualifying him for a CPAP trial which was then initiated.  Medication taken at [email or web address]  Polysomnogram Data A full night polysomnogram was performed recording the standard physiologic parameters including EEG, EOG, EMG, EKG, nasal and oral airflow.  Respiratory parameters of chest and abdominal movements are recorded with Piezo-Crystal motion transducers.   Oxygen saturation was recorded by pulse oximetry.  Sleep Architecture The total recording time of the diagnostic portion of the study was 143.5 minutes.  The total sleep time was 126.5 minutes.  During the diagnostic portion of the study, the patient spent 4.0% of total sleep time in Stage N1, 60.5% in Stage N2, 18.6% in  Stages N3, and 17.0% in REM.   Sleep latency was 7.0 minutes.  REM latency was 75.0 minutes.  Sleep Efficiency was 88.2%.  Wake after Sleep Onset time was 10.0 minutes.  At 11:35:37 PM the patient was placed on PAP treatment and was titrated at pressures ranging from 6/2 cm/H20 up to BiPAP S/T at 14/8/13 cm/H2O.  The total recording time of the treatment portion of the study was 261.5 minutes.  The total sleep time was  196.5 minutes.  During the treatment portion of the study, the patient spent 9.2% of total sleep time in Stage N1, 75.3% in Stage N2, 0.3% in Stages N3, and 15.3% in REM.   Sleep latency was 3.5 minutes.  REM latency was 101.0 minutes.  Sleep Efficiency  was 75.1%.  Wake after Sleep Onset time was 61.5 minutes.  Respiratory Events During the diagnostic portion of the study, the polysomnogram revealed a presence of 11 obstructive, 13 central, and 4 mixed apneas resulting in an Apnea index of 13.3 events per hour.   There were 54 hypopneas (GreaterEqual to3% desaturation and/or  arousal) resulting in an Apnea\Hypopnea Index (AHI GreaterEqual to3% desaturation and/or arousal) of 38.9 events per hour.  There were 30 hypopneas (GreaterEqual to4% desaturation) resulting in an Apnea\Hypopnea Index (AHI GreaterEqual to4% desaturation)  of 27.5 events per hour.  There were 8 Respiratory Effort Related Arousals resulting in a RERA index of 3.8 events per hour. The Respiratory Disturbance Index is 42.7 events per hour.  The snore index was 307.4 events per hour.  Mean oxygen saturation  was 94.6%.  The lowest oxygen saturation during sleep was 89.0%.  Time spent LessEqual to88% oxygen saturation was 0 minutes.  During the treatment portion of the study, the polysomnogram revealed a presence of 12 obstructive, 44 central, and 2 mixed apneas resulting in an Apnea index of 17.7 events per hour.  There were 68 hypopneas (GreaterEqual to3% desaturation and/or  arousal) resulting in an Apnea\Hypopnea Index (AHI GreaterEqual to3% desaturation and/or arousal) of 38.5 events per hour.  There were 44 hypopneas (GreaterEqual to4% desaturation) resulting in an Apnea\Hypopnea Index (AHI GreaterEqual to4% desaturation)  of 31.1 events per hour.  There were 20 Respiratory Effort Related Arousals resulting in a RERA index of 6.1 events per hour. The Respiratory Disturbance Index is 44.6 events per hour.  The snore index was 0 events per hour.  Mean oxygen saturation was  95.6%.  The lowest oxygen saturation during sleep was 90.0%.  Time spent LessEqual to88% oxygen saturation was 0 minutes.  Limb Activity During the diagnostic  portion of the study, there were 0 limb movements recorded.  During the treatment portion of the study, there were 0 limb movements recorded    Cardiac Summary During the diagnostic portion of the study, the average pulse rate was 68.7 bpm.  The minimum pulse rate was 60.0 bpm while the maximum pulse rate was  96.0 bpm.  During the treatment portion of the study, the average pulse rate was 66.0 bpm.  The minimum pulse rate was 51.0 bpm while the maximum pulse rate was 95.0 bpm.  Diagnosis: Moderate Obstructive Sleep Apnea Unsuccessful CPAP titration due to ongoing respiratory events.  Recommendations: 1. Recommend in lab full night BiPAP titration.  Patient may need BiPAP S/T. 2. Healthy sleep recommendations include:  adequate nightly sleep (normal 7-9 hrs/night), avoidance of caffeine after noon and alcohol near bedtime, and maintaining a sleep environment that is cool, dark and quiet. 3. Weight loss for overweight patients is recommended.  Even modest amounts of weight loss can significantly improve the severity of sleep apnea. 4. Snoring recommendations include:  weight loss where appropriate, side sleeping, and avoidance of alcohol before bed. 5. Operation of motor vehicle should be avoided when sleepy.   This study was personally reviewed and electronically signed by: SHLOMO WILBERT SAUNDERS, MD Accredited Board Certified in Sleep Medicine Date/Time: 05/30/2024 12:45PM "

## 2024-05-31 ENCOUNTER — Other Ambulatory Visit (HOSPITAL_BASED_OUTPATIENT_CLINIC_OR_DEPARTMENT_OTHER): Payer: Self-pay

## 2024-06-02 ENCOUNTER — Telehealth (HOSPITAL_COMMUNITY): Payer: Self-pay

## 2024-06-02 NOTE — Telephone Encounter (Signed)
 Advanced Heart Failure Triage Encounter  Patient Name: Darius [name]  Date of Call: 06/02/24  Problem:  Patient had a cath 05/27/24. He would like to know when he can resume regular daily activities.   Plan:  Sent to provider for further review   Rolin LOISE Height, CMA

## 2024-06-03 NOTE — Telephone Encounter (Signed)
 Tried to reach patient no answer. Left vm to return call.

## 2024-08-10 ENCOUNTER — Ambulatory Visit (HOSPITAL_COMMUNITY): Admitting: Cardiology
